# Patient Record
Sex: Male | Born: 1950 | Race: White | Hispanic: No | Marital: Married | Smoking: Former smoker
Health system: Southern US, Community
[De-identification: ages and names within clinical notes are randomized; demographics above are authoritative.]

## PROBLEM LIST (undated history)

## (undated) DIAGNOSIS — G4730 Sleep apnea, unspecified: Secondary | ICD-10-CM

## (undated) DIAGNOSIS — J189 Pneumonia, unspecified organism: Secondary | ICD-10-CM

## (undated) DIAGNOSIS — T7840XA Allergy, unspecified, initial encounter: Secondary | ICD-10-CM

## (undated) DIAGNOSIS — R011 Cardiac murmur, unspecified: Secondary | ICD-10-CM

## (undated) DIAGNOSIS — Z7901 Long term (current) use of anticoagulants: Secondary | ICD-10-CM

## (undated) DIAGNOSIS — I10 Essential (primary) hypertension: Secondary | ICD-10-CM

## (undated) DIAGNOSIS — J181 Lobar pneumonia, unspecified organism: Secondary | ICD-10-CM

## (undated) DIAGNOSIS — J449 Chronic obstructive pulmonary disease, unspecified: Secondary | ICD-10-CM

## (undated) DIAGNOSIS — M51369 Other intervertebral disc degeneration, lumbar region without mention of lumbar back pain or lower extremity pain: Secondary | ICD-10-CM

## (undated) DIAGNOSIS — R4182 Altered mental status, unspecified: Secondary | ICD-10-CM

## (undated) DIAGNOSIS — E119 Type 2 diabetes mellitus without complications: Secondary | ICD-10-CM

## (undated) DIAGNOSIS — J9601 Acute respiratory failure with hypoxia: Secondary | ICD-10-CM

## (undated) DIAGNOSIS — I340 Nonrheumatic mitral (valve) insufficiency: Secondary | ICD-10-CM

## (undated) DIAGNOSIS — I4819 Other persistent atrial fibrillation: Secondary | ICD-10-CM

## (undated) DIAGNOSIS — A419 Sepsis, unspecified organism: Secondary | ICD-10-CM

## (undated) DIAGNOSIS — Z86711 Personal history of pulmonary embolism: Secondary | ICD-10-CM

## (undated) DIAGNOSIS — E669 Obesity, unspecified: Secondary | ICD-10-CM

## (undated) DIAGNOSIS — M5136 Other intervertebral disc degeneration, lumbar region: Secondary | ICD-10-CM

## (undated) DIAGNOSIS — N179 Acute kidney failure, unspecified: Secondary | ICD-10-CM

## (undated) DIAGNOSIS — I480 Paroxysmal atrial fibrillation: Secondary | ICD-10-CM

## (undated) DIAGNOSIS — K635 Polyp of colon: Secondary | ICD-10-CM

## (undated) DIAGNOSIS — I4891 Unspecified atrial fibrillation: Secondary | ICD-10-CM

## (undated) HISTORY — DX: Acute kidney failure, unspecified: N17.9

## (undated) HISTORY — DX: Nonrheumatic mitral (valve) insufficiency: I34.0

## (undated) HISTORY — DX: Personal history of pulmonary embolism: Z86.711

## (undated) HISTORY — DX: Other intervertebral disc degeneration, lumbar region: M51.36

## (undated) HISTORY — DX: Cardiac murmur, unspecified: R01.1

## (undated) HISTORY — DX: Sepsis, unspecified organism: A41.9

## (undated) HISTORY — DX: Lobar pneumonia, unspecified organism: J18.1

## (undated) HISTORY — DX: Polyp of colon: K63.5

## (undated) HISTORY — DX: Unspecified atrial fibrillation: I48.91

## (undated) HISTORY — DX: Paroxysmal atrial fibrillation: I48.0

## (undated) HISTORY — DX: Altered mental status, unspecified: R41.82

## (undated) HISTORY — DX: Acute respiratory failure with hypoxia: J96.01

## (undated) HISTORY — DX: Type 2 diabetes mellitus without complications: E11.9

## (undated) HISTORY — DX: Sleep apnea, unspecified: G47.30

## (undated) HISTORY — DX: Chronic obstructive pulmonary disease, unspecified: J44.9

## (undated) HISTORY — DX: Essential (primary) hypertension: I10

## (undated) HISTORY — DX: Other intervertebral disc degeneration, lumbar region without mention of lumbar back pain or lower extremity pain: M51.369

## (undated) HISTORY — DX: Obesity, unspecified: E66.9

## (undated) HISTORY — DX: Allergy, unspecified, initial encounter: T78.40XA

## (undated) HISTORY — DX: Pneumonia, unspecified organism: J18.9

## (undated) HISTORY — DX: Long term (current) use of anticoagulants: Z79.01

## (undated) HISTORY — DX: Other persistent atrial fibrillation: I48.19

---

## 2000-02-05 ENCOUNTER — Ambulatory Visit (HOSPITAL_COMMUNITY): Admission: RE | Admit: 2000-02-05 | Discharge: 2000-02-05 | Payer: Self-pay | Admitting: Family Medicine

## 2000-02-05 ENCOUNTER — Encounter: Payer: Self-pay | Admitting: Family Medicine

## 2000-02-13 ENCOUNTER — Encounter: Admission: RE | Admit: 2000-02-13 | Discharge: 2000-03-31 | Payer: Self-pay | Admitting: Family Medicine

## 2003-10-08 ENCOUNTER — Ambulatory Visit (HOSPITAL_COMMUNITY): Admission: RE | Admit: 2003-10-08 | Discharge: 2003-10-08 | Payer: Self-pay | Admitting: Gastroenterology

## 2003-10-08 LAB — HM COLONOSCOPY

## 2005-05-02 ENCOUNTER — Emergency Department (HOSPITAL_COMMUNITY): Admission: EM | Admit: 2005-05-02 | Discharge: 2005-05-02 | Payer: Self-pay | Admitting: Family Medicine

## 2012-12-02 ENCOUNTER — Encounter: Payer: Self-pay | Admitting: Family Medicine

## 2012-12-02 DIAGNOSIS — M5136 Other intervertebral disc degeneration, lumbar region: Secondary | ICD-10-CM | POA: Insufficient documentation

## 2012-12-02 DIAGNOSIS — I10 Essential (primary) hypertension: Secondary | ICD-10-CM | POA: Insufficient documentation

## 2012-12-02 DIAGNOSIS — K635 Polyp of colon: Secondary | ICD-10-CM | POA: Insufficient documentation

## 2012-12-02 DIAGNOSIS — E119 Type 2 diabetes mellitus without complications: Secondary | ICD-10-CM | POA: Insufficient documentation

## 2012-12-02 DIAGNOSIS — R011 Cardiac murmur, unspecified: Secondary | ICD-10-CM | POA: Insufficient documentation

## 2012-12-02 DIAGNOSIS — T7840XA Allergy, unspecified, initial encounter: Secondary | ICD-10-CM | POA: Insufficient documentation

## 2012-12-09 ENCOUNTER — Encounter: Payer: Self-pay | Admitting: Family Medicine

## 2012-12-09 ENCOUNTER — Ambulatory Visit (INDEPENDENT_AMBULATORY_CARE_PROVIDER_SITE_OTHER): Payer: BC Managed Care – PPO | Admitting: Family Medicine

## 2012-12-09 VITALS — BP 120/74 | HR 88 | Temp 98.2°F | Resp 20 | Wt 320.0 lb

## 2012-12-09 DIAGNOSIS — IMO0001 Reserved for inherently not codable concepts without codable children: Secondary | ICD-10-CM

## 2012-12-09 DIAGNOSIS — E669 Obesity, unspecified: Secondary | ICD-10-CM

## 2012-12-09 DIAGNOSIS — R0601 Orthopnea: Secondary | ICD-10-CM

## 2012-12-09 DIAGNOSIS — R0989 Other specified symptoms and signs involving the circulatory and respiratory systems: Secondary | ICD-10-CM

## 2012-12-09 DIAGNOSIS — R0600 Dyspnea, unspecified: Secondary | ICD-10-CM

## 2012-12-09 DIAGNOSIS — R5383 Other fatigue: Secondary | ICD-10-CM

## 2012-12-09 DIAGNOSIS — R5381 Other malaise: Secondary | ICD-10-CM

## 2012-12-09 HISTORY — DX: Obesity, unspecified: E66.9

## 2012-12-09 LAB — BASIC METABOLIC PANEL
BUN: 18 mg/dL (ref 6–23)
CO2: 31 mEq/L (ref 19–32)
Calcium: 8.8 mg/dL (ref 8.4–10.5)
Chloride: 101 mEq/L (ref 96–112)
Creat: 0.91 mg/dL (ref 0.50–1.35)
Glucose, Bld: 102 mg/dL — ABNORMAL HIGH (ref 70–99)
Potassium: 4.6 mEq/L (ref 3.5–5.3)
Sodium: 140 mEq/L (ref 135–145)

## 2012-12-09 LAB — HEPATIC FUNCTION PANEL
ALT: 12 U/L (ref 0–53)
Bilirubin, Direct: 0.3 mg/dL (ref 0.0–0.3)
Total Bilirubin: 0.8 mg/dL (ref 0.3–1.2)

## 2012-12-09 NOTE — Progress Notes (Signed)
Subjective:     Patient ID: William Alvarado, male   DOB: Apr 07, 1951, 62 y.o.   MRN: [id number]  HPI Patient is a 62 year old white male here for followup for his diabetes, hypertension, hyperlipidemia. Current Outpatient Prescriptions on File Prior to Visit  Medication Sig Dispense Refill  . aspirin 81 MG tablet Take 81 mg by mouth daily.      . fish oil-omega-3 fatty acids 1000 MG capsule Take 1 g by mouth daily.      . furosemide (LASIX) 40 MG tablet Take 40 mg by mouth 2 (two) times daily.      Marland Kitchen lisinopril (PRINIVIL,ZESTRIL) 20 MG tablet Take 20 mg by mouth daily.      . metFORMIN (GLUCOPHAGE) 1000 MG tablet Take 1,000 mg by mouth 2 (two) times daily with a meal.       No current facility-administered medications on file prior to visit.   is not checking his sugars at present.  Medications are reviewed with the patient he reports compliance.  However he is concerned because he's having increasing dyspnea on exertion, orthopnea, and paroxysmal nocturnal dyspnea.  He is now using Lasix twice a day.  He is wearing his compression stockings.  However, his legs continue to swell.  He is also having to sleep in his recliner due to feeling like he smothering and shortness of breath.  His leg swelling is getting worse.  He also snores loudly at night and his wife has witnessed apnea episodes.  He denies any chest pain.  Denies any hypoglycemia.     Review of Systems Review of systems is otherwise normal    Objective:   Physical Exam  Constitutional: He appears well-developed and well-nourished.  HENT:  Head: Normocephalic and atraumatic.  Eyes: Conjunctivae and EOM are normal. Pupils are equal, round, and reactive to light.  Neck: Normal range of motion. Neck supple. No thyromegaly present.  Cardiovascular: Normal rate, regular rhythm and normal heart sounds.   Pulmonary/Chest: Effort normal. He has decreased breath sounds. He has wheezes. He has rales in the right lower field and the left  lower field.   he has a small mandible and a large neck circumference He has +2 edema to his knees bilaterally     Assessment:     Type II or unspecified type diabetes mellitus without mention of complication, uncontrolled - Plan: Basic Metabolic Panel, Hemoglobin A1c, Hepatic Function Panel  Other malaise and fatigue - Plan: Nocturnal polysomnography (NPSG), 2D Echocardiogram with contrast  Orthopnea - Plan: Nocturnal polysomnography (NPSG), 2D Echocardiogram with contrast  Paroxysmal nocturnal dyspnea - Plan: Nocturnal polysomnography (NPSG), 2D Echocardiogram with contrast  Morbid obesity - Plan: Nocturnal polysomnography (NPSG), 2D Echocardiogram with contrast      Plan:     I will review his laboratory studies and titrate his medicines to affect his goal LDL is less than 100.  His goal A1c is less than 6.5.  I recommended diet exercise and weight loss.  I recommended smoking cessation.  Further treatment will de determined pending on his sleep study and echo results.   Marland Kitchen

## 2012-12-10 LAB — HEMOGLOBIN A1C
Hgb A1c MFr Bld: 6.2 % — ABNORMAL HIGH (ref <5.7)
Mean Plasma Glucose: 131 mg/dL — ABNORMAL HIGH (ref <117)

## 2012-12-13 ENCOUNTER — Encounter: Payer: Self-pay | Admitting: Family Medicine

## 2012-12-13 ENCOUNTER — Telehealth: Payer: Self-pay | Admitting: Family Medicine

## 2012-12-13 DIAGNOSIS — R609 Edema, unspecified: Secondary | ICD-10-CM

## 2012-12-13 MED ORDER — FUROSEMIDE 40 MG PO TABS: 40.0000 mg | ORAL_TABLET | Freq: Two times a day (BID) | ORAL | Status: DC

## 2012-12-13 NOTE — Telephone Encounter (Signed)
Medication refilled per protocol.Patient needs to be seen before any further refills 

## 2012-12-15 ENCOUNTER — Encounter: Payer: Self-pay | Admitting: Family Medicine

## 2012-12-20 ENCOUNTER — Other Ambulatory Visit (HOSPITAL_COMMUNITY): Payer: Self-pay | Admitting: Family Medicine

## 2012-12-20 DIAGNOSIS — R0602 Shortness of breath: Secondary | ICD-10-CM

## 2012-12-22 ENCOUNTER — Other Ambulatory Visit (HOSPITAL_COMMUNITY): Payer: Self-pay

## 2012-12-23 ENCOUNTER — Telehealth: Payer: Self-pay | Admitting: Family Medicine

## 2012-12-23 ENCOUNTER — Ambulatory Visit (INDEPENDENT_AMBULATORY_CARE_PROVIDER_SITE_OTHER): Payer: BC Managed Care – PPO | Admitting: Family Medicine

## 2012-12-23 ENCOUNTER — Ambulatory Visit (HOSPITAL_COMMUNITY): Payer: BC Managed Care – PPO | Attending: Family Medicine

## 2012-12-23 VITALS — BP 120/78 | HR 120 | Temp 98.4°F | Resp 24 | Wt 314.0 lb

## 2012-12-23 DIAGNOSIS — R5381 Other malaise: Secondary | ICD-10-CM | POA: Insufficient documentation

## 2012-12-23 DIAGNOSIS — R0989 Other specified symptoms and signs involving the circulatory and respiratory systems: Secondary | ICD-10-CM | POA: Insufficient documentation

## 2012-12-23 DIAGNOSIS — R0602 Shortness of breath: Secondary | ICD-10-CM

## 2012-12-23 DIAGNOSIS — I4891 Unspecified atrial fibrillation: Secondary | ICD-10-CM | POA: Insufficient documentation

## 2012-12-23 DIAGNOSIS — R609 Edema, unspecified: Secondary | ICD-10-CM

## 2012-12-23 DIAGNOSIS — R5383 Other fatigue: Secondary | ICD-10-CM | POA: Insufficient documentation

## 2012-12-23 DIAGNOSIS — R0609 Other forms of dyspnea: Secondary | ICD-10-CM | POA: Insufficient documentation

## 2012-12-23 MED ORDER — METOPROLOL TARTRATE 25 MG PO TABS: 25.0000 mg | ORAL_TABLET | Freq: Two times a day (BID) | ORAL | Status: DC

## 2012-12-23 NOTE — Progress Notes (Addendum)
Echocardiogram performed. Patient found to be in rapid A fib today at echo appt.  Spoke with DOD, Dr Eden Emms, he said patient should be set up for appt here on Monday or Tuesday and it was OK for patient to go home.  I called Dr Tanya Nones his medical doctor and left a message on answering machine that patient was found to be in A fib.

## 2012-12-24 ENCOUNTER — Encounter: Payer: Self-pay | Admitting: Family Medicine

## 2012-12-24 LAB — TSH: TSH: 1.722 u[IU]/mL (ref 0.350–4.500)

## 2012-12-24 NOTE — Progress Notes (Signed)
Subjective:     Patient ID: William Alvarado, male   DOB: 1951-01-11, 62 y.o.   MRN: [id number]  HPI  At last office visit, patient was concerned because he was having increasing dyspnea on exertion, orthopnea, and paroxysmal nocturnal dyspnea.  Even with using Lasix twice a day and wearing his compression stockings, his legs continued to swell.  He was also having to sleep in his recliner due to feeling like he smothering and shortness of breath.    At the time, he was in NSR.  I subsequently ordered an echocardiogram and a sleep study to evaluate for congestive heart failure, pulmonary hypertension, or obstructive sleep apnea. The patient has not scheduled a sleep study due to a high insurance deductible.  He did go get the echocardiogram today and was found to be in atrial fibrillation with rapid ventricular response.  I called the patient and asked him to come to the office immediately.  He denies chest pain or syncope. He denies presyncope. He does report profound fatigue.  He continues to smoke.  Past Medical History  Diagnosis Date  . Hypertension   . Allergy     allergic rhinitis  . Systolic murmur   . Colon polyps   . Diabetes mellitus without complication   . Degenerative disc disease, lumbar     Current Outpatient Prescriptions on File Prior to Visit  Medication Sig Dispense Refill  . aspirin 81 MG tablet Take 81 mg by mouth daily.      . fish oil-omega-3 fatty acids 1000 MG capsule Take 1 g by mouth daily.      . furosemide (LASIX) 40 MG tablet Take 1 tablet (40 mg total) by mouth 2 (two) times daily.  30 tablet  0  . lisinopril (PRINIVIL,ZESTRIL) 20 MG tablet Take 20 mg by mouth daily.      . metFORMIN (GLUCOPHAGE) 1000 MG tablet Take 1,000 mg by mouth 2 (two) times daily with a meal.       No current facility-administered medications on file prior to visit.   History   Social History  . Marital Status: Married    Spouse Name: N/A    Number of Children: N/A  . Years  of Education: N/A   Occupational History  . Not on file.   Social History Main Topics  . Smoking status: Current Every Day Smoker -- 1.50 packs/day    Types: Cigarettes  . Smokeless tobacco: Never Used  . Alcohol Use: No  . Drug Use: No  . Sexually Active: Not on file   Other Topics Concern  . Not on file   Social History Narrative  . No narrative on file     Family History  Problem Relation Age of Onset  . Cancer Mother 76    breast  . Heart disease Father 34    MI  . Cancer Brother     lung  . Stroke Brother 45     Review of Systems  Review of systems is otherwise normal    Objective:   Physical Exam  Constitutional: He appears well-developed and well-nourished.  HENT:  Head: Normocephalic and atraumatic.  Eyes: Conjunctivae and EOM are normal. Pupils are equal, round, and reactive to light.  Neck: Normal range of motion. Neck supple. No thyromegaly present.  Cardiovascular: Normal heart sounds and normal pulses.  An irregularly irregular rhythm present. Tachycardia present.   Pulmonary/Chest: Effort normal. He has decreased breath sounds. He has wheezes. He has rales in the right  lower field and the left lower field.   he has a small mandible and a large neck circumference He has +2 edema to his knees bilaterally    EKG shows atrial fibrillation with a heart rate of 120 beats per minute there are nonspecific ST changes in the inferior leads. There is poor R wave progression in the precordial leads. Assessment:     Atrial fibrillation - Plan: TSH Rapid ventricular response Probable sleep apnea Edema      Plan:     Patient has an office visit with Dr. Jens Som on Monday.  For the present time we will attempt to achieve rate control. Started patient on Lopressor 25 mg by mouth twice a day.  Instructed him to call me immediately if his heart rate worsens or he develops any symptoms such as presyncope, syncope, or chest pain.  Given his long smoking history,  we will have to monitor for bronchospasm on metoprolol.  Also began Pradaxa 150 mg pobid for anticoagulation.  Spent over 20 minutes discussing diagnosis with patient, and natural history, and implications.  Answered all questions.  I believe his pulmonary issues play a large role in the reason he has developed atrial fibrillation.  Strongly encouraged sleep study. Strongly encouraged smoking cessation.   Marland Kitchen

## 2012-12-26 ENCOUNTER — Other Ambulatory Visit: Payer: Self-pay | Admitting: Cardiology

## 2012-12-26 ENCOUNTER — Other Ambulatory Visit: Payer: Self-pay | Admitting: *Deleted

## 2012-12-26 ENCOUNTER — Encounter: Payer: Self-pay | Admitting: *Deleted

## 2012-12-26 ENCOUNTER — Ambulatory Visit (INDEPENDENT_AMBULATORY_CARE_PROVIDER_SITE_OTHER): Payer: BC Managed Care – PPO | Admitting: Cardiology

## 2012-12-26 ENCOUNTER — Encounter: Payer: Self-pay | Admitting: Cardiology

## 2012-12-26 VITALS — BP 114/80 | HR 106 | Wt 315.0 lb

## 2012-12-26 DIAGNOSIS — I10 Essential (primary) hypertension: Secondary | ICD-10-CM

## 2012-12-26 DIAGNOSIS — I4891 Unspecified atrial fibrillation: Secondary | ICD-10-CM

## 2012-12-26 DIAGNOSIS — I340 Nonrheumatic mitral (valve) insufficiency: Secondary | ICD-10-CM

## 2012-12-26 DIAGNOSIS — I059 Rheumatic mitral valve disease, unspecified: Secondary | ICD-10-CM

## 2012-12-26 DIAGNOSIS — R609 Edema, unspecified: Secondary | ICD-10-CM

## 2012-12-26 HISTORY — DX: Nonrheumatic mitral (valve) insufficiency: I34.0

## 2012-12-26 LAB — HEPATIC FUNCTION PANEL
Alkaline Phosphatase: 91 U/L (ref 39–117)
Bilirubin, Direct: 0.3 mg/dL (ref 0.0–0.3)
Total Bilirubin: 0.9 mg/dL (ref 0.3–1.2)

## 2012-12-26 LAB — CBC WITH DIFFERENTIAL/PLATELET
Eosinophils Absolute: 0.1 10*3/uL (ref 0.0–0.7)
Lymphocytes Relative: 16.3 % (ref 12.0–46.0)
MCHC: 32.4 g/dL (ref 30.0–36.0)
MCV: 85.2 fl (ref 78.0–100.0)
Monocytes Absolute: 1 10*3/uL (ref 0.1–1.0)
Neutrophils Relative %: 70.7 % (ref 43.0–77.0)
Platelets: 306 10*3/uL (ref 150.0–400.0)
WBC: 9 10*3/uL (ref 4.5–10.5)

## 2012-12-26 LAB — BASIC METABOLIC PANEL
CO2: 33 mEq/L — ABNORMAL HIGH (ref 19–32)
Calcium: 8.8 mg/dL (ref 8.4–10.5)
Potassium: 3.8 mEq/L (ref 3.5–5.1)
Sodium: 138 mEq/L (ref 135–145)

## 2012-12-26 MED ORDER — SODIUM CHLORIDE 0.9 % IJ SOLN: 3.0000 mL | Freq: Two times a day (BID) | INTRAMUSCULAR | Status: DC

## 2012-12-26 MED ORDER — SODIUM CHLORIDE 0.9 % IJ SOLN: 3.0000 mL | INTRAMUSCULAR | Status: DC | PRN

## 2012-12-26 MED ORDER — SODIUM CHLORIDE 0.9 % IV SOLN: 250.0000 mL | INTRAVENOUS | Status: DC

## 2012-12-26 MED ORDER — METOPROLOL TARTRATE 50 MG PO TABS: 50.0000 mg | ORAL_TABLET | Freq: Two times a day (BID) | ORAL | Status: DC

## 2012-12-26 MED ORDER — LISINOPRIL 5 MG PO TABS: 5.0000 mg | ORAL_TABLET | Freq: Every day | ORAL | Status: DC

## 2012-12-26 MED ORDER — POTASSIUM CHLORIDE CRYS ER 20 MEQ PO TBCR: 20.0000 meq | EXTENDED_RELEASE_TABLET | Freq: Every day | ORAL | Status: DC

## 2012-12-26 MED ORDER — FUROSEMIDE 40 MG PO TABS: ORAL_TABLET | ORAL | Status: DC

## 2012-12-26 NOTE — Telephone Encounter (Signed)
Pt came here for ov 12/23/12

## 2012-12-26 NOTE — Progress Notes (Signed)
Echo Report Faxed to Dr. Tanya Nones office

## 2012-12-26 NOTE — Assessment & Plan Note (Signed)
Patient will need evaluation for sleep apnea and this has been arranged by primary care. Counseled on weight loss.

## 2012-12-26 NOTE — Progress Notes (Signed)
HPI: 62 year old male for evaluation of atrial fibrillation. Echocardiogram in April of 2014 showed normal LV function, moderate mitral regurgitation, trace aortic insufficiency, moderate tricuspid regurgitation and moderate biatrial enlargement. TSH in April of 2014 was 1.722. Renal function and LFTs in March of 2014 normal. The patient has chronic pedal edema. However over the past several months it has worsened and he has had some abdominal swelling. He also notes dyspnea on exertion for approximately one month. There is also orthopnea but he denies PND. No chest pain, palpitations, bleeding or syncope. Patient recently noted to be in atrial fibrillation and cardiology asked to evaluate. Note his Lasix was increased to twice daily and there is some improvement in his lower extremity edema.  Current Outpatient Prescriptions  Medication Sig Dispense Refill  . dabigatran (PRADAXA) 150 MG CAPS Take 150 mg by mouth every 12 (twelve) hours.      . fish oil-omega-3 fatty acids 1000 MG capsule Take 1 g by mouth daily.      . furosemide (LASIX) 40 MG tablet Take 1 tablet (40 mg total) by mouth 2 (two) times daily.  30 tablet  0  . lisinopril (PRINIVIL,ZESTRIL) 20 MG tablet Take 20 mg by mouth daily.      . metFORMIN (GLUCOPHAGE) 1000 MG tablet Take 1,000 mg by mouth 2 (two) times daily with a meal.      . metoprolol tartrate (LOPRESSOR) 25 MG tablet Take 1 tablet (25 mg total) by mouth 2 (two) times daily.  60 tablet  3   No current facility-administered medications for this visit.    No Known Allergies  Past Medical History  Diagnosis Date  . Hypertension   . Allergy     allergic rhinitis  . Systolic murmur   . Colon polyps   . Diabetes mellitus without complication   . Degenerative disc disease, lumbar     History reviewed. No pertinent past surgical history.  History   Social History  . Marital Status: Married    Spouse Name: N/A    Number of Children: 2  . Years of Education: N/A     Occupational History  .      Machinist   Social History Main Topics  . Smoking status: Current Every Day Smoker -- 1.50 packs/day    Types: Cigarettes  . Smokeless tobacco: Never Used  . Alcohol Use: No  . Drug Use: No  . Sexually Active: Not on file   Other Topics Concern  . Not on file   Social History Narrative  . No narrative on file    Family History  Problem Relation Age of Onset  . Cancer Mother 58    breast  . Heart disease Father 61    MI  . Cancer Brother     lung  . Stroke Brother 45    ROS: no fevers or chills, productive cough, hemoptysis, dysphasia, odynophagia, melena, hematochezia, dysuria, hematuria, rash, seizure activity, orthopnea, PND, claudication. Remaining systems are negative.  Physical Exam:   Blood pressure 114/80, pulse 106, weight 315 lb (142.883 kg).  General:  Well developed/obese in NAD Skin warm/dry Patient not depressed No peripheral clubbing Back-normal HEENT-normal/normal eyelids Neck supple/normal carotid upstroke bilaterally; no bruits; no JVD; no thyromegaly chest - CTA/ normal expansion CV - tachycardic and irregular/normal S1 and S2; no murmurs, rubs or gallops;  PMI nondisplaced Abdomen -NT/ND, no HSM, no mass, + bowel sounds, no bruit, abdominal wall edema 2+ femoral pulses, no bruits Ext-2+ edema, no chords, Difficult  to palpate due to edema Neuro-grossly nonfocal  ECG atrial fibrillation with occasional PVC or aberrantly conducted beats. Right axis deviation. RV conduction delay. Cannot rule out prior anterior infarct. Low voltage.

## 2012-12-26 NOTE — Assessment & Plan Note (Signed)
Patient with newly diagnosed atrial fibrillation. LV function is normal and TSH is normal. He is symptomatic and is significantly volume overloaded. This is improving with higher dose Lasix. Check potassium and renal function today. If renal function is stable I will most likely increase his Lasix further. Check CBC and liver functions. Lengthy discussion today concerning options. Given that he is symptomatic I would like to reestablish sinus rhythm. His rate is elevated. Increase metoprolol to 25 mg by mouth twice a day. Continue pradaxa. I offered TEE guided cardioversion but he would like to afford the TEE. Instead he prefers to continue anticoagulation for 3 consecutive weeks and then proceed with cardioversion. We will arrange this. If he does not hold sinus he will need an antiarrhythmic most likely tikosyn. I will have him return in one and one half weeks to see one of physician's assistants to make sure that he is not deteriorating further. If so he may need to be admitted for IV diuresis.

## 2012-12-26 NOTE — Assessment & Plan Note (Signed)
Given that we are increasing his metoprolol I will decrease his lisinopril to make blood pressure room for increasing AV nodal blocking agent doses

## 2012-12-26 NOTE — Assessment & Plan Note (Signed)
Patient noted to have moderate MR  And TR on recent echo. He will need followup echoes in the future. Question contribution to atrial fibrillation.

## 2012-12-26 NOTE — Patient Instructions (Addendum)
Your physician recommends that you schedule a follow-up appointment in: 01-06-13 WITH THE PA  Your physician recommends that you schedule a follow-up appointment in: 6-8 WEEKS WITH DR Jens Som  Your physician recommends that you HAVE LAB WORK TODAY  INCREASE METOPROLOL TO 50 MG TWICE DAILY  DECREASE LISINOPRIL TO 5 MG ONCE DAILY   Your physician has recommended that you have a Cardioversion (DCCV). Electrical Cardioversion uses a jolt of electricity to your heart either through paddles or wired patches attached to your chest. This is a controlled, usually prescheduled, procedure. Defibrillation is done under light anesthesia in the hospital, and you usually go home the day of the procedure. This is done to get your heart back into a normal rhythm. You are not awake for the procedure. Please see the instruction sheet given to you today.

## 2012-12-27 ENCOUNTER — Telehealth: Payer: Self-pay | Admitting: Cardiology

## 2012-12-27 ENCOUNTER — Ambulatory Visit (HOSPITAL_BASED_OUTPATIENT_CLINIC_OR_DEPARTMENT_OTHER): Payer: BC Managed Care – PPO

## 2012-12-27 NOTE — Telephone Encounter (Signed)
Spoke with pt wife, the pt is considering TEE cardioversion and wonders how soon that can be done. Discussed with dr Jens Som, the TEE cardioversion can be scheduled as soon as next week. Explained to pts wife that Joice Lofts is not a first line med for the atrial fib. He would need to be cardioverted first and then go back into atrial fib before tikosyn would be considered. Pt wife voiced understanding and they will call me back if they want to do the TEE cardioversion next week.

## 2012-12-27 NOTE — Telephone Encounter (Signed)
New problem   Pt have a question about the procedure he is having. Please call wife in reference to the procedure.

## 2013-01-03 ENCOUNTER — Encounter: Payer: Self-pay | Admitting: Family Medicine

## 2013-01-06 ENCOUNTER — Other Ambulatory Visit: Payer: Self-pay | Admitting: Nurse Practitioner

## 2013-01-06 ENCOUNTER — Other Ambulatory Visit (INDEPENDENT_AMBULATORY_CARE_PROVIDER_SITE_OTHER): Payer: BC Managed Care – PPO

## 2013-01-06 ENCOUNTER — Encounter (HOSPITAL_COMMUNITY): Payer: Self-pay | Admitting: Pharmacy Technician

## 2013-01-06 ENCOUNTER — Encounter: Payer: Self-pay | Admitting: Nurse Practitioner

## 2013-01-06 ENCOUNTER — Ambulatory Visit (INDEPENDENT_AMBULATORY_CARE_PROVIDER_SITE_OTHER): Payer: BC Managed Care – PPO | Admitting: Nurse Practitioner

## 2013-01-06 VITALS — BP 116/84 | HR 109 | Ht 73.0 in | Wt 325.4 lb

## 2013-01-06 DIAGNOSIS — I4891 Unspecified atrial fibrillation: Secondary | ICD-10-CM

## 2013-01-06 DIAGNOSIS — R0989 Other specified symptoms and signs involving the circulatory and respiratory systems: Secondary | ICD-10-CM

## 2013-01-06 DIAGNOSIS — R609 Edema, unspecified: Secondary | ICD-10-CM

## 2013-01-06 LAB — CBC WITH DIFFERENTIAL/PLATELET
Basophils Absolute: 0.1 10*3/uL (ref 0.0–0.1)
Basophils Relative: 1.1 % (ref 0.0–3.0)
Eosinophils Absolute: 0.2 10*3/uL (ref 0.0–0.7)
Eosinophils Relative: 1.8 % (ref 0.0–5.0)
HCT: 44.5 % (ref 39.0–52.0)
Hemoglobin: 14.3 g/dL (ref 13.0–17.0)
Lymphocytes Relative: 13 % (ref 12.0–46.0)
Lymphs Abs: 1.3 10*3/uL (ref 0.7–4.0)
MCHC: 32.1 g/dL (ref 30.0–36.0)
MCV: 85.4 fl (ref 78.0–100.0)
Monocytes Absolute: 1 10*3/uL (ref 0.1–1.0)
Monocytes Relative: 10.6 % (ref 3.0–12.0)
Neutro Abs: 7.2 10*3/uL (ref 1.4–7.7)
Neutrophils Relative %: 73.5 % (ref 43.0–77.0)
Platelets: 308 10*3/uL (ref 150.0–400.0)
RBC: 5.22 Mil/uL (ref 4.22–5.81)
RDW: 18.5 % — ABNORMAL HIGH (ref 11.5–14.6)
WBC: 9.8 10*3/uL (ref 4.5–10.5)

## 2013-01-06 LAB — BASIC METABOLIC PANEL
BUN: 18 mg/dL (ref 6–23)
CO2: 33 mEq/L — ABNORMAL HIGH (ref 19–32)
Calcium: 8.3 mg/dL — ABNORMAL LOW (ref 8.4–10.5)
Chloride: 99 mEq/L (ref 96–112)
Creatinine, Ser: 0.9 mg/dL (ref 0.4–1.5)
GFR: 91.6 mL/min (ref >60.00)
Glucose, Bld: 102 mg/dL — ABNORMAL HIGH (ref 70–99)
Potassium: 3.6 mEq/L (ref 3.5–5.1)
Sodium: 137 mEq/L (ref 135–145)

## 2013-01-06 LAB — PROTIME-INR
INR: 1.7 ratio — ABNORMAL HIGH (ref 0.8–1.0)
Prothrombin Time: 17.5 s — ABNORMAL HIGH (ref 10.2–12.4)

## 2013-01-06 LAB — APTT: aPTT: 57.9 s — ABNORMAL HIGH (ref 21.7–28.8)

## 2013-01-06 MED ORDER — FUROSEMIDE 40 MG PO TABS: 80.0000 mg | ORAL_TABLET | Freq: Two times a day (BID) | ORAL | Status: DC

## 2013-01-06 MED ORDER — DILTIAZEM HCL ER COATED BEADS 240 MG PO CP24: 240.0000 mg | ORAL_CAPSULE | Freq: Every day | ORAL | Status: DC

## 2013-01-06 NOTE — Patient Instructions (Addendum)
I am adding Diltiazem 240 mg a day to help control your heart rate  I am increasing your Lasix to 40 mg to take 2 pills two times a day  Stop your Lisinopril for now and do not take  We will check labs today  Take your potassium pill  We will try to get a TEE guided cardioversion arranged for next week in the place of the cardioversion  Call the [facility] office at [phone] if you have any questions, problems or concerns.

## 2013-01-06 NOTE — Progress Notes (Signed)
 William Alvarado Date of Birth: 09/14/1951 Medical Record #[id number]  History of Present Illness: William Alvarado is seen back today for a follow up visit. It is an approximate 10 day check. Seen for Dr. Crenshaw. Has had new onset atrial fib. On Pradaxa. Has normal LV function by echo in April of 2014 with moderate MR, trace AI, moderate TR and moderate biatrial enlargement. Normal TSH. Has chronic pedal edema.   Seen earlier this month. Some volume overload and his rate was not controlled.Lasix was increased along with his dose of Metoprolol. Patient did not want TEE guided cardioversion but was opting for plain cardioversion after sufficient anticoagulation. Plan was for Tikosyn if he fails to maintain sinus rhythm following cardioversion.   He comes in today. He is here with his wife. He remains in atrial fib. He is really not aware but has volume overload. More swelling reported. Weight is up 10 pounds. Has DOE. Using salt (cans of soup). Remains morbidly obese. Now interested in proceeding on with TEE guided cardioversion instead of the plain cardioversion which was planned for the 28th. Has been on his Pradaxa 2 weeks as of today. No missed doses. No bleeding. Not taking his potassium - wife is trying to supplement "naturally".   Current Outpatient Prescriptions on File Prior to Visit  Medication Sig Dispense Refill  . dabigatran (PRADAXA) 150 MG CAPS Take 150 mg by mouth every 12 (twelve) hours.      . fish oil-omega-3 fatty acids 1000 MG capsule Take 1 g by mouth daily.      . furosemide (LASIX) 40 MG tablet One and one half tablets twice daily  90 tablet  12  . lisinopril (PRINIVIL,ZESTRIL) 5 MG tablet Take 1 tablet (5 mg total) by mouth daily.  30 tablet  12  . metFORMIN (GLUCOPHAGE) 1000 MG tablet Take 1,000 mg by mouth 2 (two) times daily with a meal.      . metoprolol tartrate (LOPRESSOR) 50 MG tablet Take 1 tablet (50 mg total) by mouth 2 (two) times daily.  60 tablet  12  . potassium  chloride SA (K-DUR,KLOR-CON) 20 MEQ tablet Take 1 tablet (20 mEq total) by mouth daily.  90 tablet  3   No current facility-administered medications on file prior to visit.    No Known Allergies  Past Medical History  Diagnosis Date  . Hypertension   . Allergy     allergic rhinitis  . Systolic murmur   . Colon polyps   . Diabetes mellitus without complication   . Degenerative disc disease, lumbar   . Atrial fibrillation     History reviewed. No pertinent past surgical history.  History  Smoking status  . Current Every Day Smoker -- 1.50 packs/day  . Types: Cigarettes  Smokeless tobacco  . Never Used    History  Alcohol Use No    Family History  Problem Relation Age of Onset  . Cancer Mother 50    breast  . Heart disease Father 50    MI  . Cancer Brother     lung  . Stroke Brother 45    Review of Systems: The review of systems is per the HPI.  All other systems were reviewed and are negative.  Physical Exam: BP 116/84  Pulse 109  Ht 6' 1" (1.854 m)  Wt 325 lb 6 oz (147.589 kg)  BMI 42.94 kg/m2  SpO2 94% Patient is very pleasant and in no acute distress. Skin is warm and dry. Color   is normal.  HEENT is unremarkable. Normocephalic/atraumatic. PERRL. Sclera are nonicteric. Neck is supple. No masses. No JVD. Lungs are clear. Cardiac exam shows an irregular rate and rhythm. Rate is a little fast today. Abdomen is soft. Extremities are with pedal edema. Gait and ROM are intact. No gross neurologic deficits noted.  LABORATORY DATA: EKG today shows atrial fib with uncontrolled VR. Rate is 109  Lab Results  Component Value Date   WBC 9.0 12/26/2012   HGB 14.3 12/26/2012   HCT 44.1 12/26/2012   PLT 306.0 12/26/2012   GLUCOSE 103* 12/26/2012   ALT 14 12/26/2012   AST 14 12/26/2012   NA 138 12/26/2012   K 3.8 12/26/2012   CL 100 12/26/2012   CREATININE 0.9 12/26/2012   BUN 13 12/26/2012   CO2 33* 12/26/2012   TSH 1.722 12/23/2012   HGBA1C 6.2* 12/09/2012    Echo Study  Conclusions  - Left ventricle: The cavity size was normal. Wall thickness was increased in a pattern of moderate LVH. Systolic function was normal. The estimated ejection fraction was in the range of 55% to 60%. Wall motion was normal; there were no regional wall motion abnormalities. - Aortic valve: Trivial regurgitation. - Mitral valve: Calcified annulus. Moderate regurgitation. - Left atrium: The atrium was moderately dilated. - Right atrium: The atrium was moderately dilated. - Tricuspid valve: Moderate regurgitation. - Pulmonary arteries: PA peak pressure: 37mm Hg (S).  Assessment / Plan: 1. Atrial fib - on Pradaxa for 2 weeks - rate is still not controlled. Will try to arrange for TEE guided cardioversion next week if possible. Diltiazem 240 mg is added for rate control as well. Will stop his ACE altogether in light of the additional AV nodal blocking agents.   2. Chronic edema - has normal EF - weight is up. Lasix is increased to 80 mg BID. He is advised to take the potassium. Rechecking his labs today as well.   3. HTN - blood pressure is controlled.   4. Morbid obesity  Patient is agreeable to this plan and will call if any problems develop in the interim.   William Alvarado C. William Chiao, RN, ANP-C [facility] [address] [place], [place]  [address]  

## 2013-01-09 ENCOUNTER — Encounter (HOSPITAL_COMMUNITY): Payer: Self-pay | Admitting: *Deleted

## 2013-01-09 ENCOUNTER — Encounter (HOSPITAL_COMMUNITY)
Admission: RE | Disposition: A | Discharge status: Home / Self Care | Payer: Self-pay | Source: Ambulatory Visit | Attending: Internal Medicine

## 2013-01-09 ENCOUNTER — Encounter (HOSPITAL_COMMUNITY): Payer: Self-pay | Admitting: Anesthesiology

## 2013-01-09 ENCOUNTER — Ambulatory Visit (HOSPITAL_COMMUNITY): Payer: BC Managed Care – PPO | Admitting: Anesthesiology

## 2013-01-09 ENCOUNTER — Ambulatory Visit (HOSPITAL_COMMUNITY)
Admission: RE | Admit: 2013-01-09 | Discharge: 2013-01-09 | Disposition: A | Discharge status: Home / Self Care | Payer: BC Managed Care – PPO | Source: Ambulatory Visit | Attending: Internal Medicine | Admitting: Internal Medicine

## 2013-01-09 DIAGNOSIS — Z79899 Other long term (current) drug therapy: Secondary | ICD-10-CM | POA: Insufficient documentation

## 2013-01-09 DIAGNOSIS — Q211 Atrial septal defect: Secondary | ICD-10-CM | POA: Insufficient documentation

## 2013-01-09 DIAGNOSIS — E119 Type 2 diabetes mellitus without complications: Secondary | ICD-10-CM | POA: Insufficient documentation

## 2013-01-09 DIAGNOSIS — Q2111 Secundum atrial septal defect: Secondary | ICD-10-CM | POA: Insufficient documentation

## 2013-01-09 DIAGNOSIS — I10 Essential (primary) hypertension: Secondary | ICD-10-CM | POA: Insufficient documentation

## 2013-01-09 DIAGNOSIS — R011 Cardiac murmur, unspecified: Secondary | ICD-10-CM | POA: Insufficient documentation

## 2013-01-09 DIAGNOSIS — Z7901 Long term (current) use of anticoagulants: Secondary | ICD-10-CM | POA: Insufficient documentation

## 2013-01-09 DIAGNOSIS — I4891 Unspecified atrial fibrillation: Secondary | ICD-10-CM

## 2013-01-09 DIAGNOSIS — Z8249 Family history of ischemic heart disease and other diseases of the circulatory system: Secondary | ICD-10-CM | POA: Insufficient documentation

## 2013-01-09 DIAGNOSIS — F17200 Nicotine dependence, unspecified, uncomplicated: Secondary | ICD-10-CM | POA: Insufficient documentation

## 2013-01-09 DIAGNOSIS — Z6841 Body Mass Index (BMI) 40.0 and over, adult: Secondary | ICD-10-CM | POA: Insufficient documentation

## 2013-01-09 HISTORY — PX: CARDIOVERSION: SHX1299

## 2013-01-09 HISTORY — PX: TEE WITHOUT CARDIOVERSION: SHX5443

## 2013-01-09 LAB — BASIC METABOLIC PANEL
BUN: 17 mg/dL (ref 6–23)
Calcium: 9 mg/dL (ref 8.4–10.5)
Creatinine, Ser: 0.97 mg/dL (ref 0.50–1.35)
GFR calc Af Amer: >90 mL/min (ref >90)
GFR calc non Af Amer: 89 mL/min — ABNORMAL LOW (ref >90)
Glucose, Bld: 120 mg/dL — ABNORMAL HIGH (ref 70–99)
Potassium: 3.8 mEq/L (ref 3.5–5.1)

## 2013-01-09 SURGERY — ECHOCARDIOGRAM, TRANSESOPHAGEAL
Anesthesia: Moderate Sedation

## 2013-01-09 MED ORDER — FENTANYL CITRATE 0.05 MG/ML IJ SOLN
INTRAMUSCULAR | Status: AC
  Filled 2013-01-09: qty 2

## 2013-01-09 MED ORDER — SODIUM CHLORIDE 0.9 % IV SOLN
INTRAVENOUS | Status: DC | PRN
  Administered 2013-01-09: 15:00:00 via INTRAVENOUS

## 2013-01-09 MED ORDER — FLUMAZENIL 0.5 MG/5ML IV SOLN
INTRAVENOUS | Status: AC
  Filled 2013-01-09: qty 5

## 2013-01-09 MED ORDER — SODIUM CHLORIDE 0.9 % IV SOLN: INTRAVENOUS | Status: DC

## 2013-01-09 MED ORDER — LIDOCAINE VISCOUS 2 % MT SOLN
OROMUCOSAL | Status: DC | PRN
  Administered 2013-01-09: 20 mL via OROMUCOSAL

## 2013-01-09 MED ORDER — MIDAZOLAM HCL 10 MG/2ML IJ SOLN
INTRAMUSCULAR | Status: DC | PRN
  Administered 2013-01-09 (×2): 2 mg via INTRAVENOUS

## 2013-01-09 MED ORDER — MIDAZOLAM HCL 5 MG/ML IJ SOLN
INTRAMUSCULAR | Status: AC
  Filled 2013-01-09: qty 2

## 2013-01-09 MED ORDER — PROPOFOL 10 MG/ML IV BOLUS
INTRAVENOUS | Status: DC | PRN
  Administered 2013-01-09: 40 mg via INTRAVENOUS

## 2013-01-09 MED ORDER — FENTANYL CITRATE 0.05 MG/ML IJ SOLN
INTRAMUSCULAR | Status: DC | PRN
  Administered 2013-01-09: 12.5 ug via INTRAVENOUS
  Administered 2013-01-09: 25 ug via INTRAVENOUS

## 2013-01-09 MED ORDER — LIDOCAINE VISCOUS 2 % MT SOLN
OROMUCOSAL | Status: AC
  Filled 2013-01-09: qty 15

## 2013-01-09 MED ORDER — DEXTROSE-NACL 5-0.45 % IV SOLN: INTRAVENOUS | Status: DC

## 2013-01-09 MED ORDER — LIDOCAINE HCL (CARDIAC) 20 MG/ML IV SOLN
INTRAVENOUS | Status: DC | PRN
  Administered 2013-01-09: 30 mg via INTRAVENOUS

## 2013-01-09 NOTE — Anesthesia Postprocedure Evaluation (Signed)
  Anesthesia Post-op Note  Patient: William Alvarado  Procedure(s) Performed: Procedure(s): TRANSESOPHAGEAL ECHOCARDIOGRAM (TEE) (N/A) CARDIOVERSION (N/A)  Patient Location: Endoscopy Unit  Anesthesia Type:General  Level of Consciousness: responds to stimulation  Airway and Oxygen Therapy: Patient Spontanous Breathing and Patient connected to nasal cannula oxygen  Post-op Pain: none  Post-op Assessment: Post-op Vital signs reviewed, PATIENT'S CARDIOVASCULAR STATUS UNSTABLE, RESPIRATORY FUNCTION UNSTABLE, Patent Airway and No signs of Nausea or vomiting  Post-op Vital Signs: Reviewed and stable  Complications: No apparent anesthesia complications

## 2013-01-09 NOTE — Progress Notes (Signed)
  Echocardiogram Echocardiogram Transesophageal has been performed.  Nnaemeka Samson 01/09/2013, 3:36 PM

## 2013-01-09 NOTE — Op Note (Signed)
Full report to follow 

## 2013-01-09 NOTE — Anesthesia Preprocedure Evaluation (Signed)
Anesthesia Evaluation  Patient identified by MRN, date of birth, ID band Patient unresponsive  General Assessment Comment:Pt already sedated and unresponsive upon my arrival to endoscopy suite.  Reviewed: Allergy & Precautions, H&P , NPO status , Patient's Chart, lab work & pertinent test results  Airway Mallampati: III  Neck ROM: limited    Dental   Pulmonary          Cardiovascular hypertension, + dysrhythmias Atrial Fibrillation     Neuro/Psych    GI/Hepatic   Endo/Other  diabetes, Type obesity  Renal/GU      Musculoskeletal   Abdominal   Peds  Hematology   Anesthesia Other Findings   Reproductive/Obstetrics                           Anesthesia Physical Anesthesia Plan  ASA: III  Anesthesia Plan: MAC   Post-op Pain Management:    Induction: Intravenous  Airway Management Planned: Mask  Additional Equipment:   Intra-op Plan:   Post-operative Plan:   Informed Consent: I have reviewed the patients History and Physical, chart, labs and discussed the procedure including the risks, benefits and alternatives for the proposed anesthesia with the patient or authorized representative who has indicated his/her understanding and acceptance.     Plan Discussed with: CRNA, Anesthesiologist and Surgeon  Anesthesia Plan Comments:         Anesthesia Quick Evaluation

## 2013-01-09 NOTE — Transfer of Care (Signed)
Immediate Anesthesia Transfer of Care Note  Patient: William Alvarado  Procedure(s) Performed: Procedure(s): TRANSESOPHAGEAL ECHOCARDIOGRAM (TEE) (N/A) CARDIOVERSION (N/A)  Patient Location: Endoscopy Unit  Anesthesia Type:General  Level of Consciousness: responds to stimulation  Airway & Oxygen Therapy: Patient Spontanous Breathing and Patient connected to face mask oxygen  Post-op Assessment: Report given to PACU RN, Post -op Vital signs reviewed and stable and Patient moving all extremities  Post vital signs: Reviewed and stable  Complications: No apparent anesthesia complications

## 2013-01-09 NOTE — H&P (View-Only) (Signed)
William Alvarado Date of Birth: Feb 19, 1951 Medical Record #[id number]  History of Present Illness: William Alvarado is seen back today for a follow up visit. It is an approximate 10 day check. Seen for Dr. Jens Som. Has had new onset atrial fib. On Pradaxa. Has normal LV function by echo in April of 2014 with moderate MR, trace AI, moderate TR and moderate biatrial enlargement. Normal TSH. Has chronic pedal edema.   Seen earlier this month. Some volume overload and his rate was not controlled.Lasix was increased along with his dose of Metoprolol. Patient did not want TEE guided cardioversion but was opting for plain cardioversion after sufficient anticoagulation. Plan was for Tikosyn if he fails to maintain sinus rhythm following cardioversion.   He comes in today. He is here with his wife. He remains in atrial fib. He is really not aware but has volume overload. More swelling reported. Weight is up 10 pounds. Has DOE. Using salt (cans of soup). Remains morbidly obese. Now interested in proceeding on with TEE guided cardioversion instead of the plain cardioversion which was planned for the 28th. Has been on his Pradaxa 2 weeks as of today. No missed doses. No bleeding. Not taking his potassium - wife is trying to supplement "naturally".   Current Outpatient Prescriptions on File Prior to Visit  Medication Sig Dispense Refill  . dabigatran (PRADAXA) 150 MG CAPS Take 150 mg by mouth every 12 (twelve) hours.      . fish oil-omega-3 fatty acids 1000 MG capsule Take 1 g by mouth daily.      . furosemide (LASIX) 40 MG tablet One and one half tablets twice daily  90 tablet  12  . lisinopril (PRINIVIL,ZESTRIL) 5 MG tablet Take 1 tablet (5 mg total) by mouth daily.  30 tablet  12  . metFORMIN (GLUCOPHAGE) 1000 MG tablet Take 1,000 mg by mouth 2 (two) times daily with a meal.      . metoprolol tartrate (LOPRESSOR) 50 MG tablet Take 1 tablet (50 mg total) by mouth 2 (two) times daily.  60 tablet  12  . potassium  chloride SA (K-DUR,KLOR-CON) 20 MEQ tablet Take 1 tablet (20 mEq total) by mouth daily.  90 tablet  3   No current facility-administered medications on file prior to visit.    No Known Allergies  Past Medical History  Diagnosis Date  . Hypertension   . Allergy     allergic rhinitis  . Systolic murmur   . Colon polyps   . Diabetes mellitus without complication   . Degenerative disc disease, lumbar   . Atrial fibrillation     History reviewed. No pertinent past surgical history.  History  Smoking status  . Current Every Day Smoker -- 1.50 packs/day  . Types: Cigarettes  Smokeless tobacco  . Never Used    History  Alcohol Use No    Family History  Problem Relation Age of Onset  . Cancer Mother 42    breast  . Heart disease Father 39    MI  . Cancer Brother     lung  . Stroke Brother 45    Review of Systems: The review of systems is per the HPI.  All other systems were reviewed and are negative.  Physical Exam: BP 116/84  Pulse 109  Ht 6\' 1"  (1.854 m)  Wt 325 lb 6 oz (147.589 kg)  BMI 42.94 kg/m2  SpO2 94% Patient is very pleasant and in no acute distress. Skin is warm and dry. Color  is normal.  HEENT is unremarkable. Normocephalic/atraumatic. PERRL. Sclera are nonicteric. Neck is supple. No masses. No JVD. Lungs are clear. Cardiac exam shows an irregular rate and rhythm. Rate is a little fast today. Abdomen is soft. Extremities are with pedal edema. Gait and ROM are intact. No gross neurologic deficits noted.  LABORATORY DATA: EKG today shows atrial fib with uncontrolled VR. Rate is 109  Lab Results  Component Value Date   WBC 9.0 12/26/2012   HGB 14.3 12/26/2012   HCT 44.1 12/26/2012   PLT 306.0 12/26/2012   GLUCOSE 103* 12/26/2012   ALT 14 12/26/2012   AST 14 12/26/2012   NA 138 12/26/2012   K 3.8 12/26/2012   CL 100 12/26/2012   CREATININE 0.9 12/26/2012   BUN 13 12/26/2012   CO2 33* 12/26/2012   TSH 1.722 12/23/2012   HGBA1C 6.2* 12/09/2012    Echo Study  Conclusions  - Left ventricle: The cavity size was normal. Wall thickness was increased in a pattern of moderate LVH. Systolic function was normal. The estimated ejection fraction was in the range of 55% to 60%. Wall motion was normal; there were no regional wall motion abnormalities. - Aortic valve: Trivial regurgitation. - Mitral valve: Calcified annulus. Moderate regurgitation. - Left atrium: The atrium was moderately dilated. - Right atrium: The atrium was moderately dilated. - Tricuspid valve: Moderate regurgitation. - Pulmonary arteries: PA peak pressure: 37mm Hg (S).  Assessment / Plan: 1. Atrial fib - on Pradaxa for 2 weeks - rate is still not controlled. Will try to arrange for TEE guided cardioversion next week if possible. Diltiazem 240 mg is added for rate control as well. Will stop his ACE altogether in light of the additional AV nodal blocking agents.   2. Chronic edema - has normal EF - weight is up. Lasix is increased to 80 mg BID. He is advised to take the potassium. Rechecking his labs today as well.   3. HTN - blood pressure is controlled.   4. Morbid obesity  Patient is agreeable to this plan and will call if any problems develop in the interim.   William Macadamia, RN, ANP-C [facility] [address] [place], [place]  [address]

## 2013-01-09 NOTE — Op Note (Signed)
Patient anesthetized with 40 mg Propofol  With pads in apex/base position patient converted to SR with 200 J synchronized biphasic energy.

## 2013-01-09 NOTE — Interval H&P Note (Signed)
History and Physical Interval Note:  01/09/2013 2:39 PM  William Alvarado  has presented today for surgery, with the diagnosis of stones  The various methods of treatment have been discussed with the patient and family. After consideration of risks, benefits and other options for treatment, the patient has consented to  Procedure(s): TRANSESOPHAGEAL ECHOCARDIOGRAM (TEE) (N/A) CARDIOVERSION (N/A) as a surgical intervention .  The patient's history has been reviewed, patient examined, no change in status, stable for surgery.  I have reviewed the patient's chart and labs.  Questions were answered to the patient's satisfaction.     William Alvarado

## 2013-01-10 ENCOUNTER — Encounter (HOSPITAL_COMMUNITY): Payer: Self-pay | Admitting: Internal Medicine

## 2013-01-10 ENCOUNTER — Telehealth: Payer: Self-pay | Admitting: Family Medicine

## 2013-01-10 NOTE — Telephone Encounter (Signed)
Fluid in legs and abdomen taking 4 lasix per day since fridayh and not doing any good. Had his heart shocked yesterday in A-fib since April 4 shocked it back into rhythmn. What do you recommend? States BP is good, just concerned about him not urinating like he should, urinatng every 5-6 hrs on lasix pt feels like something is wrong

## 2013-01-10 NOTE — Telephone Encounter (Signed)
° °  ° °  ° °  ° °  Telephone Encounter William Alvarado (MR# [id number])         Telephone Encounter Info    Author Note Status Last Update User Last Update Date/Time   Samuella Cota Signed Samuella Cota 01/10/2013 4:22 PM             Fluid in legs and abdomen taking 4 lasix per day since fridayh and not doing any good. Had his heart shocked yesterday in A-fib since April 4 shocked it back into rhythmn. What do you recommend? States BP is good, just concerned about him not urinating like he should, urinatng every 5-6 hrs on lasix pt feels like something is wrong

## 2013-01-10 NOTE — Telephone Encounter (Signed)
Work in tomorrow

## 2013-01-11 ENCOUNTER — Ambulatory Visit (INDEPENDENT_AMBULATORY_CARE_PROVIDER_SITE_OTHER): Payer: BC Managed Care – PPO | Admitting: Family Medicine

## 2013-01-11 ENCOUNTER — Telehealth: Payer: Self-pay | Admitting: Cardiology

## 2013-01-11 ENCOUNTER — Encounter: Payer: Self-pay | Admitting: Family Medicine

## 2013-01-11 VITALS — BP 100/70 | HR 74 | Temp 98.0°F | Resp 22 | Wt 328.0 lb

## 2013-01-11 DIAGNOSIS — R609 Edema, unspecified: Secondary | ICD-10-CM

## 2013-01-11 DIAGNOSIS — R601 Generalized edema: Secondary | ICD-10-CM

## 2013-01-11 DIAGNOSIS — I4891 Unspecified atrial fibrillation: Secondary | ICD-10-CM

## 2013-01-11 LAB — URINALYSIS, MICROSCOPIC ONLY
Bacteria, UA: NONE SEEN
Crystals: NONE SEEN
Squamous Epithelial / LPF: NONE SEEN
WBC, UA: NONE SEEN WBC/hpf (ref <3)

## 2013-01-11 LAB — URINALYSIS, ROUTINE W REFLEX MICROSCOPIC
Bilirubin Urine: NEGATIVE
Glucose, UA: NEGATIVE mg/dL
Ketones, ur: NEGATIVE mg/dL
Protein, ur: NEGATIVE mg/dL
Urobilinogen, UA: 2 mg/dL — ABNORMAL HIGH (ref 0.0–1.0)

## 2013-01-11 MED ORDER — BUMETANIDE 2 MG PO TABS: 2.0000 mg | ORAL_TABLET | Freq: Every day | ORAL | Status: DC

## 2013-01-11 MED ORDER — METOLAZONE 10 MG PO TABS: ORAL_TABLET | ORAL | Status: DC

## 2013-01-11 MED ORDER — DABIGATRAN ETEXILATE MESYLATE 150 MG PO CAPS: 150.0000 mg | ORAL_CAPSULE | Freq: Two times a day (BID) | ORAL | Status: DC

## 2013-01-11 NOTE — Telephone Encounter (Signed)
New problem   Pt need to know what he is set up for at the hospital. Pt didn't know what he is going to hospital for. Please call pt.

## 2013-01-11 NOTE — Progress Notes (Signed)
Subjective:    Patient ID: William Alvarado, male    DOB: November 04, 1950, 62 y.o.   MRN: [id number]  HPI I reviewed the last office visit. Since that time the patient has had an echocardiogram which revealed an ejection fraction greater than 60%. He is undergone TEE cardioversion. He is currently in normal sinus rhythm. However he and his wife are extremely concerned. He has gained almost 14 pounds in essentially fluid over the last month. His legs are tight swollen and weeping with edema. His abdomen is increasing in girth.  He is having increasing shortness of breath. His pulse ox today in the office is 95% on room air. His Lasix is continually been increased and is now 80 mg twice a day. However even after quadruple in his Lasix, his edema is worsening.  Liver function tests and creatinine have remained normal. Past Medical History  Diagnosis Date  . Hypertension   . Allergy     allergic rhinitis  . Systolic murmur   . Colon polyps   . Diabetes mellitus without complication   . Degenerative disc disease, lumbar   . Atrial fibrillation    Current Outpatient Prescriptions on File Prior to Visit  Medication Sig Dispense Refill  . fish oil-omega-3 fatty acids 1000 MG capsule Take 1 g by mouth daily.      . metFORMIN (GLUCOPHAGE) 1000 MG tablet Take 1,000 mg by mouth 2 (two) times daily with a meal.      . metoprolol tartrate (LOPRESSOR) 50 MG tablet Take 1 tablet (50 mg total) by mouth 2 (two) times daily.  60 tablet  12  . potassium chloride SA (K-DUR,KLOR-CON) 20 MEQ tablet Take 1 tablet (20 mEq total) by mouth daily.  90 tablet  3   No current facility-administered medications on file prior to visit.   No Known Allergies History   Social History  . Marital Status: Married    Spouse Name: N/A    Number of Children: 2  . Years of Education: N/A   Occupational History  .      Machinist   Social History Main Topics  . Smoking status: Current Some Day Smoker -- 1.50 packs/day   Types: Cigarettes  . Smokeless tobacco: Never Used  . Alcohol Use: No  . Drug Use: No  . Sexually Active: Not Currently   Other Topics Concern  . Not on file   Social History Narrative  . No narrative on file       Review of Systems  All other systems reviewed and are negative.       Objective:   Physical Exam  Constitutional: He appears well-developed and well-nourished.  Neck: Neck supple. No JVD present. No thyromegaly present.  Cardiovascular: Normal rate, regular rhythm, normal heart sounds and intact distal pulses.  Exam reveals no gallop and no friction rub.   No murmur heard. Pulmonary/Chest: Effort normal. He has rales (He has faint bibasilar rales).  Abdominal: Soft. Bowel sounds are normal. He exhibits distension. There is no tenderness. There is no rebound and no guarding.  Lymphadenopathy:    He has no cervical adenopathy.   he has +2 weeping edema in both legs to the level of the knees with venous stasis changes and erythema in the shins.        Assessment & Plan:  1. Anasarca Rule out other causes of anasarca including with an ultrasound of the liver to rule out cirrhosis and a urinalysis to rule out nephrotic syndrome.  The patient may be having a difficult time absorbing Lasix due to edema in the bowel.  Therefore I am going to switch to Bumex 2 mg tablets once a day. We will try to time the pump" with metolazone 10 mg tablets 30 minutes prior to Bumex. Also put the patient in unna boots bilaterally and advised him to elevate his legs to facilitate reabsorption of his peripheral edema. Will see the patient back in 48 hours for evaluation. Advised to go to the hospital if he developed shortness of breath or other signs of pulmonary edema. - metolazone (ZAROXOLYN) 10 MG tablet; Take 1 tab 30 minutes before bumex.  Dispense: 30 tablet; Refill: 0 - bumetanide (BUMEX) 2 MG tablet; Take 1 tablet (2 mg total) by mouth daily.  Dispense: 30 tablet; Refill: 0 - US  Abdomen Complete; Future - Urinalysis, Routine w reflex microscopic  2. Atrial fibrillation Currently in normal sinus rhythm. He remains on anticoagulation

## 2013-01-11 NOTE — Telephone Encounter (Signed)
Left message for pt to call.

## 2013-01-11 NOTE — Telephone Encounter (Signed)
Spoke with pt wife, the pt had a TEE cardioversion on 01-09-13 and the hosp is still calling because he is scheduled for a procedure on 01-16-13. Called over to endo. Procedure scheduled for 01-16-13 canceled.

## 2013-01-11 NOTE — Telephone Encounter (Signed)
Follow up  ° ° ° °Returning call back to nurse  °

## 2013-01-12 ENCOUNTER — Ambulatory Visit: Payer: Self-pay | Admitting: Family Medicine

## 2013-01-13 ENCOUNTER — Other Ambulatory Visit: Payer: Self-pay | Admitting: Family Medicine

## 2013-01-13 ENCOUNTER — Encounter: Payer: Self-pay | Admitting: Family Medicine

## 2013-01-13 ENCOUNTER — Ambulatory Visit (INDEPENDENT_AMBULATORY_CARE_PROVIDER_SITE_OTHER): Payer: BC Managed Care – PPO | Admitting: Family Medicine

## 2013-01-13 VITALS — BP 120/68 | HR 82 | Temp 98.3°F | Resp 22 | Wt 302.0 lb

## 2013-01-13 DIAGNOSIS — R601 Generalized edema: Secondary | ICD-10-CM

## 2013-01-13 DIAGNOSIS — R609 Edema, unspecified: Secondary | ICD-10-CM

## 2013-01-13 NOTE — Progress Notes (Signed)
Subjective:    Patient ID: William Alvarado, male    DOB: [birth year], 62 y.o.   MRN: [id number]  HPI  I reviewed the last office visit. Since that time the patient has had an echocardiogram which revealed an ejection fraction greater than 60%. He is undergone TEE cardioversion. He is currently in normal sinus rhythm. However he and his wife are extremely concerned. He has gained almost 14 pounds in essentially fluid over the last month. His legs are tight swollen and weeping with edema. His abdomen is increasing in girth.  He is having increasing shortness of breath. His pulse ox today in the office is 95% on room air. His Lasix is continually been increased and is now 80 mg twice a day. However even after quadruple in his Lasix, his edema is worsening.  Liver function tests and creatinine have remained normal. The patient may be having a difficult time absorbing Lasix due to edema in the bowel.  Therefore I am going to switch to Bumex 2 mg tablets once a day. We will try to "prime the pump" with metolazone 10 mg tablets 30 minutes prior to Bumex. Also put the patient in unna boots bilaterally and advised him to elevate his legs to facilitate reabsorption of his peripheral edema. Will see the patient back in 48 hours for evaluation.  01/12/13 Patient has lost 20 pounds in 2 days.  His legs are no longer swollen.  His ascites is improved.  He no longer has shortness of breath.  The Unna boots are now loose. Past Medical History  Diagnosis Date  . Hypertension   . Allergy     allergic rhinitis  . Systolic murmur   . Colon polyps   . Diabetes mellitus without complication   . Degenerative disc disease, lumbar   . Atrial fibrillation    Current Outpatient Prescriptions on File Prior to Visit  Medication Sig Dispense Refill  . bumetanide (BUMEX) 2 MG tablet Take 1 tablet (2 mg total) by mouth daily.  30 tablet  0  . dabigatran (PRADAXA) 150 MG CAPS Take 1 capsule (150 mg total) by mouth every 12  (twelve) hours.  60 capsule  3  . diltiazem (CARDIZEM CD) 240 MG 24 hr capsule Take 240 mg by mouth daily.      . fish oil-omega-3 fatty acids 1000 MG capsule Take 1 g by mouth daily.      . metFORMIN (GLUCOPHAGE) 1000 MG tablet Take 1,000 mg by mouth 2 (two) times daily with a meal.      . metolazone (ZAROXOLYN) 10 MG tablet Take 1 tab 30 minutes before bumex.  30 tablet  0  . metoprolol tartrate (LOPRESSOR) 50 MG tablet Take 1 tablet (50 mg total) by mouth 2 (two) times daily.  60 tablet  12  . potassium chloride SA (K-DUR,KLOR-CON) 20 MEQ tablet Take 1 tablet (20 mEq total) by mouth daily.  90 tablet  3   No current facility-administered medications on file prior to visit.   No Known Allergies History   Social History  . Marital Status: Married    Spouse Name: N/A    Number of Children: 2  . Years of Education: N/A   Occupational History  .      Machinist   Social History Main Topics  . Smoking status: Current Some Day Smoker -- 1.50 packs/day    Types: Cigarettes  . Smokeless tobacco: Never Used  . Alcohol Use: No  . Drug Use: No  .  Sexually Active: Not Currently   Other Topics Concern  . Not on file   Social History Narrative  . No narrative on file       Review of Systems  All other systems reviewed and are negative.       Objective:   Physical Exam  Constitutional: He appears well-developed and well-nourished.  Neck: Neck supple. No JVD present. No thyromegaly present.  Cardiovascular: Normal rate, regular rhythm, normal heart sounds and intact distal pulses.  Exam reveals no gallop and no friction rub.   No murmur heard. Pulmonary/Chest: Effort normal. He has no rales (He has faint bibasilar rales).  Abdominal: Soft. Bowel sounds are normal. He exhibits no distension. There is no tenderness. There is no rebound and no guarding.  Lymphadenopathy:    He has no cervical adenopathy.   he has trace edema in both legs He has no rales on exam       Assessment & Plan:  1. Edema Markedly improved.  Discontinue metolazone.  Continue Bumex 2 mg by mouth daily. Recheck BMP to ensure that have not caused hypokalemia.  If his weight continues to drop we may need to decrease the dose of Bumex. - Basic Metabolic Panel

## 2013-01-14 LAB — BASIC METABOLIC PANEL
BUN: 15 mg/dL (ref 6–23)
Creat: 0.99 mg/dL (ref 0.50–1.35)
Potassium: 3.6 mEq/L (ref 3.5–5.3)

## 2013-01-16 ENCOUNTER — Other Ambulatory Visit: Payer: BC Managed Care – PPO

## 2013-01-16 ENCOUNTER — Encounter (HOSPITAL_COMMUNITY): Admission: RE | Payer: Self-pay | Source: Ambulatory Visit

## 2013-01-16 ENCOUNTER — Ambulatory Visit (HOSPITAL_COMMUNITY): Admission: RE | Admit: 2013-01-16 | Payer: BC Managed Care – PPO | Source: Ambulatory Visit | Admitting: Cardiology

## 2013-01-16 SURGERY — CARDIOVERSION
Anesthesia: Monitor Anesthesia Care

## 2013-01-18 ENCOUNTER — Encounter: Payer: Self-pay | Admitting: Family Medicine

## 2013-01-26 ENCOUNTER — Telehealth: Payer: Self-pay | Admitting: Cardiology

## 2013-01-26 NOTE — Telephone Encounter (Signed)
Spoke with pt wife, they are unable to afford the pradaxa. Savings card and samples placed at the front desk for pick up.

## 2013-01-26 NOTE — Telephone Encounter (Signed)
New Prob      Has some questions regarding PRADAXA. Would like to speak to nurse.

## 2013-01-31 ENCOUNTER — Telehealth: Payer: Self-pay | Admitting: Family Medicine

## 2013-02-03 ENCOUNTER — Encounter: Payer: Self-pay | Admitting: Family Medicine

## 2013-02-03 NOTE — Progress Notes (Unsigned)
Patient ID: William Alvarado, male   DOB: [birth year], 62 y.o.   MRN: [id number] Pt came in stating he needed work note saying that he can go back to work without restrictions I wrote note and gave to him without limitations per [place].

## 2013-02-08 ENCOUNTER — Other Ambulatory Visit: Payer: Self-pay | Admitting: Family Medicine

## 2013-02-08 NOTE — Telephone Encounter (Signed)
Med rf °

## 2013-02-10 ENCOUNTER — Ambulatory Visit: Payer: BC Managed Care – PPO | Admitting: Cardiology

## 2013-03-03 ENCOUNTER — Telehealth: Payer: Self-pay | Admitting: Family Medicine

## 2013-03-03 DIAGNOSIS — I4891 Unspecified atrial fibrillation: Secondary | ICD-10-CM

## 2013-03-03 MED ORDER — DABIGATRAN ETEXILATE MESYLATE 150 MG PO CAPS: 150.0000 mg | ORAL_CAPSULE | Freq: Two times a day (BID) | ORAL | Status: DC

## 2013-03-03 NOTE — Telephone Encounter (Signed)
Rx Refilled  

## 2013-03-15 NOTE — Telephone Encounter (Signed)
This was an error

## 2013-04-09 ENCOUNTER — Other Ambulatory Visit: Payer: Self-pay | Admitting: Family Medicine

## 2013-04-11 ENCOUNTER — Telehealth: Payer: Self-pay | Admitting: Family Medicine

## 2013-04-11 NOTE — Telephone Encounter (Signed)
We do not have any samples at this time and pt aware  

## 2013-04-17 ENCOUNTER — Other Ambulatory Visit: Payer: Self-pay | Admitting: Family Medicine

## 2013-04-21 ENCOUNTER — Ambulatory Visit: Payer: BC Managed Care – PPO | Admitting: Cardiology

## 2013-05-12 ENCOUNTER — Telehealth: Payer: Self-pay | Admitting: Family Medicine

## 2013-05-12 DIAGNOSIS — I4891 Unspecified atrial fibrillation: Secondary | ICD-10-CM

## 2013-05-12 MED ORDER — DILTIAZEM HCL ER COATED BEADS 240 MG PO CP24: 240.0000 mg | ORAL_CAPSULE | Freq: Every day | ORAL | Status: DC

## 2013-05-12 NOTE — Telephone Encounter (Signed)
Diltiaz ER (CD) 240/24 cap 1 QD #30

## 2013-05-12 NOTE — Telephone Encounter (Signed)
Rx Refilled  

## 2013-06-23 ENCOUNTER — Ambulatory Visit: Payer: BC Managed Care – PPO | Admitting: Cardiology

## 2013-10-31 ENCOUNTER — Other Ambulatory Visit: Payer: Self-pay | Admitting: Family Medicine

## 2013-11-22 ENCOUNTER — Other Ambulatory Visit: Payer: Self-pay | Admitting: Cardiology

## 2013-11-22 ENCOUNTER — Other Ambulatory Visit: Payer: Self-pay | Admitting: Family Medicine

## 2013-11-24 ENCOUNTER — Other Ambulatory Visit: Payer: Self-pay | Admitting: Cardiology

## 2013-11-28 ENCOUNTER — Other Ambulatory Visit: Payer: Self-pay | Admitting: Cardiology

## 2013-11-30 ENCOUNTER — Encounter: Payer: Self-pay | Admitting: Family Medicine

## 2013-11-30 ENCOUNTER — Ambulatory Visit (INDEPENDENT_AMBULATORY_CARE_PROVIDER_SITE_OTHER): Payer: BC Managed Care – PPO | Admitting: Family Medicine

## 2013-11-30 VITALS — BP 120/74 | HR 62 | Temp 100.4°F | Resp 22 | Ht 73.0 in | Wt 300.0 lb

## 2013-11-30 DIAGNOSIS — J441 Chronic obstructive pulmonary disease with (acute) exacerbation: Secondary | ICD-10-CM

## 2013-11-30 MED ORDER — POTASSIUM CHLORIDE CRYS ER 20 MEQ PO TBCR: EXTENDED_RELEASE_TABLET | ORAL | Status: DC

## 2013-11-30 MED ORDER — ALBUTEROL SULFATE HFA 108 (90 BASE) MCG/ACT IN AERS: 2.0000 | INHALATION_SPRAY | Freq: Four times a day (QID) | RESPIRATORY_TRACT | Status: DC | PRN

## 2013-11-30 MED ORDER — PREDNISONE 20 MG PO TABS: ORAL_TABLET | ORAL | Status: DC

## 2013-11-30 MED ORDER — AZITHROMYCIN 250 MG PO TABS: ORAL_TABLET | ORAL | Status: DC

## 2013-11-30 NOTE — Progress Notes (Signed)
Subjective:    Patient ID: William Alvarado, male    DOB: [birth year], 63 y.o.   MRN: [id number]  HPI  Patient is a long-term smoker.  Beginning Saturday he developed a cough and some shortness of breath. The cough is productive of green sputum. Shortness of breath is worsening. He is audibly wheezing on exam and running a low-grade fever to 100.4. He denies chest pain or angina. He has no palpitations. He is in no respiratory distress. 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 Past Medical History  Diagnosis Date  . Hypertension   . Allergy     allergic rhinitis  . Systolic murmur   . Colon polyps   . Diabetes mellitus without complication   . Degenerative disc disease, lumbar   . Atrial fibrillation    Current Outpatient Prescriptions on File Prior to Visit  Medication Sig Dispense Refill  . bumetanide (BUMEX) 1 MG tablet TAKE 2 TABLETS BY MOUTH EVERY DAY  60 tablet  5  . dabigatran (PRADAXA) 150 MG CAPS Take 1 capsule (150 mg total) by mouth every 12 (twelve) hours.  60 capsule  3  . diltiazem (CARDIZEM CD) 240 MG 24 hr capsule TAKE ONE CAPSULE BY MOUTH ONCE DAILY  30 capsule  5  . fish oil-omega-3 fatty acids 1000 MG capsule Take 1 g by mouth daily.      . metFORMIN (GLUCOPHAGE) 1000 MG tablet TAKE ONE TABLET BY MOUTH TWICE DAILY  60 tablet  0  . metoprolol tartrate (LOPRESSOR) 50 MG tablet Take 1 tablet (50 mg total) by mouth 2 (two) times daily.  60 tablet  12   No current facility-administered medications on file prior to visit.   No Known Allergies History   Social History  . Marital Status: Married    Spouse Name: N/A    Number of Children: 2  . Years of Education: N/A   Occupational History  .      Machinist   Social History Main Topics  . Smoking status: Current Some Day Smoker -- 1.50 packs/day    Types: Cigarettes  . Smokeless tobacco: Never Used  . Alcohol Use: No  . Drug  Use: No  . Sexual Activity: Not Currently   Other Topics Concern  . Not on file   Social History Narrative  . No narrative on file     Review of Systems  All other systems reviewed and are negative.       Objective:   Physical Exam  Vitals reviewed. Constitutional: He appears well-developed and well-nourished. No distress.  HENT:  Right Ear: External ear normal.  Left Ear: External ear normal.  Nose: Nose normal.  Mouth/Throat: Oropharynx is clear and moist. No oropharyngeal exudate.  Neck: Neck supple. No thyromegaly present.  Cardiovascular: Normal rate and normal heart sounds.   Pulmonary/Chest: Effort normal. No respiratory distress. He has wheezes. He has no rales. He exhibits no tenderness.  Abdominal: Soft. Bowel sounds are normal.  Lymphadenopathy:    He has no cervical adenopathy.  Skin: He is not diaphoretic.          Assessment & Plan:  1. COPD exacerbation Advise the patient quit smoking this will become a permanent situation for him. I will start him on prednisone taper pack. I will start patient on Z-Pak. Again the patient prescription for albuterol 2 puffs inhaled every 6 hours as needed for wheezing.  We'll need to monitor for tachycardia on albuterol. Recheck in 4 to hours if no better or sooner if worse. - azithromycin (ZITHROMAX) 250 MG tablet; 2 tabs poqday1, 1 tab poqday 2-5  Dispense: 6 tablet; Refill: 0 - predniSONE (DELTASONE) 20 MG tablet; 3 tabs poqday 1-2, 2 tabs poqday 3-4, 1 tab poqday 5-6  Dispense: 12 tablet; Refill: 0 - albuterol (PROVENTIL HFA;VENTOLIN HFA) 108 (90 BASE) MCG/ACT inhaler; Inhale 2 puffs into the lungs every 6 (six) hours as needed for wheezing or shortness of breath.  Dispense: 1 Inhaler; Refill: 0

## 2013-12-01 ENCOUNTER — Telehealth: Payer: Self-pay | Admitting: Family Medicine

## 2013-12-01 ENCOUNTER — Other Ambulatory Visit: Payer: Self-pay | Admitting: Family Medicine

## 2013-12-01 DIAGNOSIS — J441 Chronic obstructive pulmonary disease with (acute) exacerbation: Secondary | ICD-10-CM

## 2013-12-01 MED ORDER — ALBUTEROL SULFATE HFA 108 (90 BASE) MCG/ACT IN AERS: 2.0000 | INHALATION_SPRAY | Freq: Four times a day (QID) | RESPIRATORY_TRACT | Status: DC | PRN

## 2013-12-01 NOTE — Telephone Encounter (Signed)
Proventil inhaler is costing $160.00.   He can not afford this!  What else can he use?

## 2013-12-01 NOTE — Telephone Encounter (Signed)
Can we switch him to any albuterol than is cheaper? Proair, ventolin, etc

## 2013-12-01 NOTE — Telephone Encounter (Signed)
Spoke to pharmacy because generic inhaler was ordered.  Pharmacy stated wife had called there as well.  Apparent price error.  Price now was down to $66.  Patient felt better but was going to call their  Insurance carrier to see which brand that maybe they paid better for.

## 2013-12-04 ENCOUNTER — Telehealth: Payer: Self-pay | Admitting: Family Medicine

## 2013-12-04 DIAGNOSIS — J441 Chronic obstructive pulmonary disease with (acute) exacerbation: Secondary | ICD-10-CM

## 2013-12-04 MED ORDER — ALBUTEROL SULFATE HFA 108 (90 BASE) MCG/ACT IN AERS: 2.0000 | INHALATION_SPRAY | Freq: Four times a day (QID) | RESPIRATORY_TRACT | Status: DC | PRN

## 2013-12-04 NOTE — Telephone Encounter (Signed)
Call back number is [phone] Pt wife is calling because  the inhaler that was given to him was to expensive and the insurance gave the wife 3 other alternatives  that could be used could any of these be called in for the husband   Pro air  Ventolin Xopenex  Pharmacy [facility] [address]

## 2013-12-04 NOTE — Telephone Encounter (Signed)
Sent in new rx for inhaler as requested by pt

## 2013-12-27 ENCOUNTER — Other Ambulatory Visit: Payer: Self-pay | Admitting: Cardiology

## 2014-01-03 ENCOUNTER — Other Ambulatory Visit: Payer: Self-pay | Admitting: Family Medicine

## 2014-01-03 ENCOUNTER — Other Ambulatory Visit: Payer: Self-pay | Admitting: Cardiology

## 2014-01-03 NOTE — Telephone Encounter (Signed)
Medication refilled per protocol. 

## 2014-01-19 ENCOUNTER — Other Ambulatory Visit: Payer: Self-pay

## 2014-01-19 DIAGNOSIS — I4891 Unspecified atrial fibrillation: Secondary | ICD-10-CM

## 2014-01-19 MED ORDER — METOPROLOL TARTRATE 50 MG PO TABS: 50.0000 mg | ORAL_TABLET | Freq: Two times a day (BID) | ORAL | Status: DC

## 2014-01-26 ENCOUNTER — Ambulatory Visit: Payer: BC Managed Care – PPO | Admitting: Family Medicine

## 2014-02-22 ENCOUNTER — Other Ambulatory Visit: Payer: Self-pay | Admitting: *Deleted

## 2014-02-22 DIAGNOSIS — E119 Type 2 diabetes mellitus without complications: Secondary | ICD-10-CM

## 2014-02-22 DIAGNOSIS — E785 Hyperlipidemia, unspecified: Secondary | ICD-10-CM

## 2014-02-22 DIAGNOSIS — I10 Essential (primary) hypertension: Secondary | ICD-10-CM

## 2014-02-23 ENCOUNTER — Other Ambulatory Visit: Payer: BC Managed Care – PPO

## 2014-02-23 DIAGNOSIS — E119 Type 2 diabetes mellitus without complications: Secondary | ICD-10-CM

## 2014-02-23 DIAGNOSIS — I10 Essential (primary) hypertension: Secondary | ICD-10-CM

## 2014-02-23 DIAGNOSIS — E785 Hyperlipidemia, unspecified: Secondary | ICD-10-CM

## 2014-02-23 LAB — CBC WITH DIFFERENTIAL/PLATELET
BASOS ABS: 0 10*3/uL (ref 0.0–0.1)
BASOS PCT: 0 % (ref 0–1)
Eosinophils Absolute: 0.3 10*3/uL (ref 0.0–0.7)
Eosinophils Relative: 3 % (ref 0–5)
HCT: 47.3 % (ref 39.0–52.0)
Hemoglobin: 15.6 g/dL (ref 13.0–17.0)
Lymphocytes Relative: 21 % (ref 12–46)
Lymphs Abs: 1.9 10*3/uL (ref 0.7–4.0)
MCH: 28.9 pg (ref 26.0–34.0)
MCHC: 33 g/dL (ref 30.0–36.0)
MCV: 87.6 fL (ref 78.0–100.0)
Monocytes Absolute: 0.8 10*3/uL (ref 0.1–1.0)
Monocytes Relative: 9 % (ref 3–12)
NEUTROS ABS: 6.2 10*3/uL (ref 1.7–7.7)
Neutrophils Relative %: 67 % (ref 43–77)
Platelets: 263 10*3/uL (ref 150–400)
RBC: 5.4 MIL/uL (ref 4.22–5.81)
RDW: 15.9 % — AB (ref 11.5–15.5)
WBC: 9.2 10*3/uL (ref 4.0–10.5)

## 2014-02-23 LAB — HEMOGLOBIN A1C
HEMOGLOBIN A1C: 7 % — AB (ref <5.7)
MEAN PLASMA GLUCOSE: 154 mg/dL — AB (ref <117)

## 2014-02-23 LAB — COMPLETE METABOLIC PANEL WITH GFR
ALT: 14 U/L (ref 0–53)
AST: 14 U/L (ref 0–37)
Albumin: 4 g/dL (ref 3.5–5.2)
Alkaline Phosphatase: 77 U/L (ref 39–117)
BILIRUBIN TOTAL: 0.7 mg/dL (ref 0.2–1.2)
BUN: 15 mg/dL (ref 6–23)
CO2: 29 meq/L (ref 19–32)
Calcium: 9.1 mg/dL (ref 8.4–10.5)
Chloride: 100 mEq/L (ref 96–112)
Creat: 1.01 mg/dL (ref 0.50–1.35)
GFR, Est Non African American: 80 mL/min
Glucose, Bld: 139 mg/dL — ABNORMAL HIGH (ref 70–99)
Potassium: 4.6 mEq/L (ref 3.5–5.3)
Sodium: 139 mEq/L (ref 135–145)
Total Protein: 6.9 g/dL (ref 6.0–8.3)

## 2014-02-23 LAB — LIPID PANEL
Cholesterol: 121 mg/dL (ref 0–200)
HDL: 29 mg/dL — ABNORMAL LOW (ref >39)
LDL CALC: 78 mg/dL (ref 0–99)
Total CHOL/HDL Ratio: 4.2 Ratio
Triglycerides: 69 mg/dL (ref <150)
VLDL: 14 mg/dL (ref 0–40)

## 2014-02-24 LAB — PSA: PSA: 0.26 ng/mL (ref <=4.00)

## 2014-03-02 ENCOUNTER — Ambulatory Visit (INDEPENDENT_AMBULATORY_CARE_PROVIDER_SITE_OTHER): Payer: BC Managed Care – PPO | Admitting: Family Medicine

## 2014-03-02 ENCOUNTER — Encounter: Payer: Self-pay | Admitting: Family Medicine

## 2014-03-02 VITALS — BP 130/74 | HR 78 | Temp 97.9°F | Resp 20 | Ht 73.0 in | Wt 300.0 lb

## 2014-03-02 DIAGNOSIS — R609 Edema, unspecified: Secondary | ICD-10-CM

## 2014-03-02 DIAGNOSIS — F17200 Nicotine dependence, unspecified, uncomplicated: Secondary | ICD-10-CM

## 2014-03-02 DIAGNOSIS — I10 Essential (primary) hypertension: Secondary | ICD-10-CM

## 2014-03-02 DIAGNOSIS — E119 Type 2 diabetes mellitus without complications: Secondary | ICD-10-CM

## 2014-03-02 DIAGNOSIS — I4891 Unspecified atrial fibrillation: Secondary | ICD-10-CM

## 2014-03-02 MED ORDER — METFORMIN HCL 1000 MG PO TABS: ORAL_TABLET | ORAL | Status: DC

## 2014-03-02 MED ORDER — METOPROLOL TARTRATE 50 MG PO TABS: 50.0000 mg | ORAL_TABLET | Freq: Two times a day (BID) | ORAL | Status: DC

## 2014-03-02 MED ORDER — DABIGATRAN ETEXILATE MESYLATE 150 MG PO CAPS: 150.0000 mg | ORAL_CAPSULE | Freq: Two times a day (BID) | ORAL | Status: DC

## 2014-03-02 MED ORDER — POTASSIUM CHLORIDE CRYS ER 20 MEQ PO TBCR: EXTENDED_RELEASE_TABLET | ORAL | Status: DC

## 2014-03-02 MED ORDER — BUMETANIDE 1 MG PO TABS: ORAL_TABLET | ORAL | Status: DC

## 2014-03-02 MED ORDER — DILTIAZEM HCL ER COATED BEADS 240 MG PO CP24: ORAL_CAPSULE | ORAL | Status: DC

## 2014-03-02 NOTE — Progress Notes (Signed)
Subjective:    Patient ID: William Alvarado, male    DOB: 1951-02-16, 63 y.o.   MRN: [id number]  HPI Patient has a history of type 2 diabetes mellitus. He's currently taking metformin 1000 mg by mouth twice a day. His hemoglobin A1c of 7.0. He is not exercising. He is not monitoring his diet. His weight is greater than 300 pounds. His blood pressure is currently well controlled 130/74.  Patient has never gone for formal pulmonary function test but he has had COPD exacerbations in the past. He is audibly wheezing today on his examination. He denies any shortness of breath. He states that his breathing is acceptable for him to perform his activities of daily living. He continues to smoke greater than one pack of cigarettes per day. Based on examination today I believe the patient is at least stage II COPD. At the present time he is not interested in long-acting bronchodilators. He is not ready to quit smoking. He also has a history of atrial fibrillation status post cardioversion. Today on examination he is in normal sinus rhythm although he does have frequent PVCs auscultated. His rate is currently well controlled. He is appropriately anticoagulated with pradaxa. Past Medical History  Diagnosis Date  . Hypertension   . Allergy     allergic rhinitis  . Systolic murmur   . Colon polyps   . Diabetes mellitus without complication   . Degenerative disc disease, lumbar   . Atrial fibrillation    Past Surgical History  Procedure Laterality Date  . Tee without cardioversion N/A 01/09/2013    Procedure: TRANSESOPHAGEAL ECHOCARDIOGRAM (TEE);  Surgeon: Pricilla RifflePaula V Ross, MD;  [facility];  Service: Cardiovascular;  Laterality: N/A;  . Cardioversion N/A 01/09/2013    Procedure: CARDIOVERSION;  Surgeon: Pricilla RifflePaula V Ross, MD;  [facility];  Service: Cardiovascular;  Laterality: N/A;   Current Outpatient Prescriptions on File Prior to Visit  Medication Sig Dispense Refill  . albuterol  (PROVENTIL HFA;VENTOLIN HFA) 108 (90 BASE) MCG/ACT inhaler Inhale 2 puffs into the lungs every 6 (six) hours as needed for wheezing or shortness of breath.  1 Inhaler  11  . fish oil-omega-3 fatty acids 1000 MG capsule Take 1 g by mouth daily.      Marland Kitchen. KLOR-CON M20 20 MEQ tablet TAKE ONE TABLET BY MOUTH ONCE DAILY *NEEDS OFFICE VISIT BEFORE FURTHER REFILLS*  30 tablet  0   No current facility-administered medications on file prior to visit.   No Known Allergies History   Social History  . Marital Status: Married    Spouse Name: N/A    Number of Children: 2  . Years of Education: N/A   Occupational History  .      Machinist   Social History Main Topics  . Smoking status: Current Some Day Smoker -- 1.50 packs/day    Types: Cigarettes  . Smokeless tobacco: Never Used  . Alcohol Use: No  . Drug Use: No  . Sexual Activity: Not Currently   Other Topics Concern  . Not on file   Social History Narrative  . No narrative on file      Review of Systems  All other systems reviewed and are negative.      Objective:   Physical Exam  Vitals reviewed. Constitutional: He is oriented to person, place, and time. He appears well-developed and well-nourished. No distress.  Neck: Neck supple. No JVD present. No thyromegaly present.  Cardiovascular: Normal rate and regular rhythm.  Exam reveals  no gallop and no friction rub.   Murmur heard. Pulmonary/Chest: Effort normal. He has wheezes. He has no rales. He exhibits no tenderness.  Abdominal: Soft. Bowel sounds are normal. He exhibits no distension and no mass. There is no tenderness. There is no rebound and no guarding.  Musculoskeletal: He exhibits edema.  Lymphadenopathy:    He has no cervical adenopathy.  Neurological: He is alert and oriented to person, place, and time. He has normal reflexes. He displays normal reflexes. No cranial nerve deficit. He exhibits normal muscle tone. Coordination normal.  Skin: Skin is warm. No rash  noted. He is not diaphoretic. No erythema. No pallor.          Assessment & Plan:  1. Type II or unspecified type diabetes mellitus without mention of complication, not stated as uncontrolled Diabetes is fairly controlled. I recommended increasing aerobic exercise to try to lose 5-10 pounds in order to try to drive his hemoglobin M0N less than 6.5. Also recommended a diabetic eye exam. His diabetic foot exam is up-to-date.  Continue aspirin 81 mg by mouth daily.  2. Edema The edema in his legs currently adequately controlled on Bumex. His renal function is stable his potassium is stable.  3. Atrial fibrillation Patient is currently rate controlled in normal sinus rhythm.  Continue metoprolol and diltiazem.  He is appropriately anticoagulated and continue pradaxa for stroke prevention. - metoprolol (LOPRESSOR) 50 MG tablet; Take 1 tablet (50 mg total) by mouth 2 (two) times daily.  Dispense: 60 tablet; Refill: 1 - dabigatran (PRADAXA) 150 MG CAPS capsule; Take 1 capsule (150 mg total) by mouth every 12 (twelve) hours.  Dispense: 60 capsule; Refill: 3  4. HTN (hypertension) Blood pressure is adequately controlled. I am including an ACE inhibitor due to the patient's breathing problems.  Consider an angiotensin receptor blocker in the future  5. Smoking Strongly encouraged smoking cessation.  The patient is at least stage II COPD. The patient declines pulmonary function test today.  The patient is not interested in long-acting bronchodilators. I emphasized that his breathing problems patient as long as he continues to smoke.

## 2014-03-29 ENCOUNTER — Other Ambulatory Visit: Payer: Self-pay

## 2014-03-29 MED ORDER — METOPROLOL TARTRATE 50 MG PO TABS: 50.0000 mg | ORAL_TABLET | Freq: Two times a day (BID) | ORAL | Status: DC

## 2014-03-30 ENCOUNTER — Encounter: Payer: Self-pay | Admitting: Family Medicine

## 2014-03-30 ENCOUNTER — Ambulatory Visit (INDEPENDENT_AMBULATORY_CARE_PROVIDER_SITE_OTHER): Payer: BC Managed Care – PPO | Admitting: Family Medicine

## 2014-03-30 VITALS — BP 132/78 | HR 78 | Temp 98.2°F | Resp 18 | Ht 73.0 in | Wt 311.0 lb

## 2014-03-30 DIAGNOSIS — R609 Edema, unspecified: Secondary | ICD-10-CM

## 2014-03-30 MED ORDER — METOLAZONE 5 MG PO TABS: ORAL_TABLET | ORAL | Status: DC

## 2014-03-30 NOTE — Progress Notes (Signed)
Subjective:    Patient ID: William Alvarado, male    DOB: May 15, 1951, 63 y.o.   MRN: [id number]  HPI Patient reports recalcitrant fluid in both legs. He is currently taking Bumex 2 mg by mouth daily. He is wearing his compression stockings as directed. He denies any chest pain shortness of breath or dyspnea on exertion. Previously we have treated fluid retention with an additional dose of Zaroxolyn in addition to his Bumex. This worked well then. The patient is interested in trying this again. Past Medical History  Diagnosis Date  . Hypertension   . Allergy     allergic rhinitis  . Systolic murmur   . Colon polyps   . Diabetes mellitus without complication   . Degenerative disc disease, lumbar   . Atrial fibrillation    Current Outpatient Prescriptions on File Prior to Visit  Medication Sig Dispense Refill  . albuterol (PROVENTIL HFA;VENTOLIN HFA) 108 (90 BASE) MCG/ACT inhaler Inhale 2 puffs into the lungs every 6 (six) hours as needed for wheezing or shortness of breath.  1 Inhaler  11  . aspirin 81 MG tablet Take 81 mg by mouth daily.      . bumetanide (BUMEX) 1 MG tablet TAKE 2 TABLETS BY MOUTH EVERY DAY  60 tablet  5  . dabigatran (PRADAXA) 150 MG CAPS capsule Take 1 capsule (150 mg total) by mouth every 12 (twelve) hours.  60 capsule  3  . diltiazem (CARDIZEM CD) 240 MG 24 hr capsule TAKE ONE CAPSULE BY MOUTH ONCE DAILY  30 capsule  5  . fish oil-omega-3 fatty acids 1000 MG capsule Take 1 g by mouth daily.      Marland Kitchen. KLOR-CON M20 20 MEQ tablet TAKE ONE TABLET BY MOUTH ONCE DAILY *NEEDS OFFICE VISIT BEFORE FURTHER REFILLS*  30 tablet  0  . metFORMIN (GLUCOPHAGE) 1000 MG tablet TAKE ONE TABLET BY MOUTH TWICE DAILY  60 tablet  5  . metoprolol (LOPRESSOR) 50 MG tablet Take 1 tablet (50 mg total) by mouth 2 (two) times daily.  60 tablet  1  . potassium chloride SA (KLOR-CON M20) 20 MEQ tablet TAKE ONE TABLET BY MOUTH ONCE DAILY  30 tablet  5   No current facility-administered medications  on file prior to visit.   No Known Allergies Past Surgical History  Procedure Laterality Date  . Tee without cardioversion N/A 01/09/2013    Procedure: TRANSESOPHAGEAL ECHOCARDIOGRAM (TEE);  Surgeon: Pricilla RifflePaula V Ross, MD;  [facility];  Service: Cardiovascular;  Laterality: N/A;  . Cardioversion N/A 01/09/2013    Procedure: CARDIOVERSION;  Surgeon: Pricilla RifflePaula V Ross, MD;  [facility];  Service: Cardiovascular;  Laterality: N/A;   History   Social History  . Marital Status: Married    Spouse Name: N/A    Number of Children: 2  . Years of Education: N/A   Occupational History  .      Machinist   Social History Main Topics  . Smoking status: Current Some Day Smoker -- 1.50 packs/day    Types: Cigarettes  . Smokeless tobacco: Never Used  . Alcohol Use: No  . Drug Use: No  . Sexual Activity: Not Currently   Other Topics Concern  . Not on file   Social History Narrative  . No narrative on file      Review of Systems  All other systems reviewed and are negative.      Objective:   Physical Exam  Vitals reviewed. Cardiovascular: Normal rate and regular  rhythm.   Murmur heard. Pulmonary/Chest: Effort normal. He has wheezes. He has no rales.  Abdominal: Soft. Bowel sounds are normal. He exhibits no distension. There is no tenderness. There is no rebound and no guarding.  Musculoskeletal: He exhibits edema.  Skin: There is erythema.  patient has tense +2 pitting edema in both legs with erythema and venous stasis changes on both shins.        Assessment & Plan:  1. Peripheral edema Take Zaroxolyn 5 mg times 1 in the morning and then take Bumex 2 mg 30 minutes later.  Afterwards discontinue Zaroxolyn and continue on a standard dose of Bumex. We could do this PRN if successful.  Recheck Monday. - metolazone (ZAROXOLYN) 5 MG tablet; Take 1 pill and wait 30 minutes and then take bumex.  DO NOT DO THIS EVERYDAY  Dispense: 10 tablet; Refill: 0

## 2014-05-04 ENCOUNTER — Encounter: Payer: Self-pay | Admitting: Family Medicine

## 2014-05-04 ENCOUNTER — Ambulatory Visit (INDEPENDENT_AMBULATORY_CARE_PROVIDER_SITE_OTHER): Payer: BC Managed Care – PPO | Admitting: Family Medicine

## 2014-05-04 VITALS — BP 122/78 | HR 68 | Temp 98.6°F | Resp 14 | Ht 69.69 in | Wt 304.0 lb

## 2014-05-04 DIAGNOSIS — I480 Paroxysmal atrial fibrillation: Secondary | ICD-10-CM

## 2014-05-04 DIAGNOSIS — R609 Edema, unspecified: Secondary | ICD-10-CM

## 2014-05-04 DIAGNOSIS — I4891 Unspecified atrial fibrillation: Secondary | ICD-10-CM

## 2014-05-04 MED ORDER — WARFARIN SODIUM 5 MG PO TABS: 5.0000 mg | ORAL_TABLET | Freq: Every day | ORAL | Status: DC

## 2014-05-04 NOTE — Progress Notes (Signed)
Subjective:    Patient ID: William Alvarado, male    DOB: [birth year], 63 y.o.   MRN: [id number]  HPI 03/30/14 Patient reports recalcitrant fluid in both legs. He is currently taking Bumex 2 mg by mouth daily. He is wearing his compression stockings as directed. He denies any chest pain shortness of breath or dyspnea on exertion. Previously we have treated fluid retention with an additional dose of Zaroxolyn in addition to his Bumex. This worked well then. The patient is interested in trying this again.  At that time, my plan was: 1. Peripheral edema Take Zaroxolyn 5 mg times 1 in the morning and then take Bumex 2 mg 30 minutes later.  Afterwards discontinue Zaroxolyn and continue on a standard dose of Bumex. We could do this PRN if successful.  Recheck Monday. - metolazone (ZAROXOLYN) 5 MG tablet; Take 1 pill and wait 30 minutes and then take bumex.  DO NOT DO THIS EVERYDAY  Dispense: 10 tablet; Refill: 0  05/04/14 I have not heard from the patient after the last ov.  He was able to lose approximately 15 pounds after the last office visit. However the edema has steadily increased. He now has +2 pitting edema in both legs.  He has stable chronic shortness of breath and dyspnea on exertion. He denies any chest pain or chest pressure. He continues to smoke. He has COPD. Past Medical History  Diagnosis Date  . Hypertension   . Allergy     allergic rhinitis  . Systolic murmur   . Colon polyps   . Diabetes mellitus without complication   . Degenerative disc disease, lumbar   . Atrial fibrillation    Current Outpatient Prescriptions on File Prior to Visit  Medication Sig Dispense Refill  . albuterol (PROVENTIL HFA;VENTOLIN HFA) 108 (90 BASE) MCG/ACT inhaler Inhale 2 puffs into the lungs every 6 (six) hours as needed for wheezing or shortness of breath.  1 Inhaler  11  . aspirin 81 MG tablet Take 81 mg by mouth daily.      . bumetanide (BUMEX) 1 MG tablet TAKE 2 TABLETS BY MOUTH EVERY DAY  60  tablet  5  . dabigatran (PRADAXA) 150 MG CAPS capsule Take 1 capsule (150 mg total) by mouth every 12 (twelve) hours.  60 capsule  3  . diltiazem (CARDIZEM CD) 240 MG 24 hr capsule TAKE ONE CAPSULE BY MOUTH ONCE DAILY  30 capsule  5  . fish oil-omega-3 fatty acids 1000 MG capsule Take 1 g by mouth daily.      Marland Kitchen KLOR-CON M20 20 MEQ tablet TAKE ONE TABLET BY MOUTH ONCE DAILY *NEEDS OFFICE VISIT BEFORE FURTHER REFILLS*  30 tablet  0  . metFORMIN (GLUCOPHAGE) 1000 MG tablet TAKE ONE TABLET BY MOUTH TWICE DAILY  60 tablet  5  . metolazone (ZAROXOLYN) 5 MG tablet Take 1 pill and wait 30 minutes and then take bumex.  DO NOT DO THIS EVERYDAY  10 tablet  0  . metoprolol (LOPRESSOR) 50 MG tablet Take 1 tablet (50 mg total) by mouth 2 (two) times daily.  60 tablet  1  . potassium chloride SA (KLOR-CON M20) 20 MEQ tablet TAKE ONE TABLET BY MOUTH ONCE DAILY  30 tablet  5   No current facility-administered medications on file prior to visit.   No Known Allergies Past Surgical History  Procedure Laterality Date  . Tee without cardioversion N/A 01/09/2013    Procedure: TRANSESOPHAGEAL ECHOCARDIOGRAM (TEE);  Surgeon: Pricilla Riffle, MD;  Location: [facility] ENDOSCOPY;  Service: Cardiovascular;  Laterality: N/A;  . Cardioversion N/A 01/09/2013    Procedure: CARDIOVERSION;  Surgeon: Pricilla RifflePaula V Ross, MD;  [facility];  Service: Cardiovascular;  Laterality: N/A;   History   Social History  . Marital Status: Married    Spouse Name: N/A    Number of Children: 2  . Years of Education: N/A   Occupational History  .      Machinist   Social History Main Topics  . Smoking status: Current Some Day Smoker -- 1.50 packs/day    Types: Cigarettes  . Smokeless tobacco: Never Used  . Alcohol Use: No  . Drug Use: No  . Sexual Activity: Not Currently   Other Topics Concern  . Not on file   Social History Narrative  . No narrative on file      Review of Systems  All other systems reviewed and are  negative.      Objective:   Physical Exam  Vitals reviewed. Cardiovascular: Normal rate and regular rhythm.   Murmur heard. Pulmonary/Chest: Effort normal. He has wheezes. He has no rales.  Abdominal: Soft. Bowel sounds are normal. He exhibits no distension. There is no tenderness. There is no rebound and no guarding.  Musculoskeletal: He exhibits edema.  Skin: There is erythema.  patient has tense +2 pitting edema in both legs with erythema and venous stasis changes on both shins. Circumference of his left calf is 48 cm. Circumference of his right calf is 43 cm.       Assessment & Plan:   Paroxysmal atrial fibrillation - Plan: warfarin (COUMADIN) 5 MG tablet  Patient is not currently taking aspirin with Robaxin. Therefore I will start the patient on warfarin 5 mg by mouth daily and recheck in 9 are in one week. The patient does have peripheral edema. He is uncontrolled on Bumex 2 mg by mouth daily. Increase bumex to 2 mg by mouth every morning and 1 mg by mouth every afternoon and recheck a BNP and swelling one week. I believe the swelling is due to right-sided heart failure due to a combination of morbid obesity, COPD and smoking, and likely sleep apnea. I recommended smoking cessation. I recommended weight loss. Also requested to perform a sleep study but the patient refused. Recheck in [redacted] week along with INR.

## 2014-05-11 ENCOUNTER — Ambulatory Visit (INDEPENDENT_AMBULATORY_CARE_PROVIDER_SITE_OTHER): Payer: BC Managed Care – PPO | Admitting: Family Medicine

## 2014-05-11 ENCOUNTER — Encounter: Payer: Self-pay | Admitting: Family Medicine

## 2014-05-11 VITALS — BP 110/76 | HR 76 | Temp 98.4°F | Resp 18 | Ht 73.0 in | Wt 300.0 lb

## 2014-05-11 DIAGNOSIS — I4891 Unspecified atrial fibrillation: Secondary | ICD-10-CM

## 2014-05-11 DIAGNOSIS — I480 Paroxysmal atrial fibrillation: Secondary | ICD-10-CM

## 2014-05-11 LAB — PT WITH INR/FINGERSTICK
INR FINGERSTICK: 1.3 — AB (ref 0.80–1.20)
PT FINGERSTICK: 15.5 s — AB (ref 10.4–12.5)

## 2014-05-11 LAB — BASIC METABOLIC PANEL WITH GFR
BUN: 14 mg/dL (ref 6–23)
CALCIUM: 9.1 mg/dL (ref 8.4–10.5)
CO2: 28 mEq/L (ref 19–32)
CREATININE: 0.94 mg/dL (ref 0.50–1.35)
Chloride: 98 mEq/L (ref 96–112)
GFR, EST NON AFRICAN AMERICAN: 88 mL/min
Glucose, Bld: 120 mg/dL — ABNORMAL HIGH (ref 70–99)
Potassium: 4.8 mEq/L (ref 3.5–5.3)
Sodium: 136 mEq/L (ref 135–145)

## 2014-05-11 NOTE — Progress Notes (Signed)
Subjective:    Patient ID: William Alvarado, male    DOB: May 24, 1951, 63 y.o.   MRN: [id number]  HPI 03/30/14 Patient reports recalcitrant fluid in both legs. He is currently taking Bumex 2 mg by mouth daily. He is wearing his compression stockings as directed. He denies any chest pain shortness of breath or dyspnea on exertion. Previously we have treated fluid retention with an additional dose of Zaroxolyn in addition to his Bumex. This worked well then. The patient is interested in trying this again.  At that time, my plan was: 1. Peripheral edema Take Zaroxolyn 5 mg times 1 in the morning and then take Bumex 2 mg 30 minutes later.  Afterwards discontinue Zaroxolyn and continue on a standard dose of Bumex. We could do this PRN if successful.  Recheck Monday. - metolazone (ZAROXOLYN) 5 MG tablet; Take 1 pill and wait 30 minutes and then take bumex.  DO NOT DO THIS EVERYDAY  Dispense: 10 tablet; Refill: 0  05/04/14 I have not heard from the patient after the last ov.  He was able to lose approximately 15 pounds after the last office visit. However the edema has steadily increased. He now has +2 pitting edema in both legs.  He has stable chronic shortness of breath and dyspnea on exertion. He denies any chest pain or chest pressure. He continues to smoke. He has COPD.  At that time, my plan was: Patient is not currently taking aspirin or pradaxa. Therefore I will start the patient on warfarin 5 mg by mouth daily and recheck in 9 are in one week. The patient does have peripheral edema. He is uncontrolled on Bumex 2 mg by mouth daily. Increase bumex to 2 mg by mouth every morning and 1 mg by mouth every afternoon and recheck a BNP and swelling one week. I believe the swelling is due to right-sided heart failure due to a combination of morbid obesity, COPD and smoking, and likely sleep apnea. I recommended smoking cessation. I recommended weight loss. Also requested to perform a sleep study but the patient  refused. Recheck in [redacted] week along with INR.  05/11/14 The swelling in the patient's legs is noticeably better. They're not intensely silent. He has +1 edema today. His weight is down 4 pounds. Overall he feels better. He is due today to recheck his potassium on the higher dose of bumex. His INR today is subtherapeutic at 1.3 Past Medical History  Diagnosis Date  . Hypertension   . Allergy     allergic rhinitis  . Systolic murmur   . Colon polyps   . Diabetes mellitus without complication   . Degenerative disc disease, lumbar   . Atrial fibrillation    Current Outpatient Prescriptions on File Prior to Visit  Medication Sig Dispense Refill  . albuterol (PROVENTIL HFA;VENTOLIN HFA) 108 (90 BASE) MCG/ACT inhaler Inhale 2 puffs into the lungs every 6 (six) hours as needed for wheezing or shortness of breath.  1 Inhaler  11  . aspirin 81 MG tablet Take 81 mg by mouth daily.      . bumetanide (BUMEX) 1 MG tablet TAKE 2 TABLETS BY MOUTH EVERY DAY  60 tablet  5  . dabigatran (PRADAXA) 150 MG CAPS capsule Take 1 capsule (150 mg total) by mouth every 12 (twelve) hours.  60 capsule  3  . diltiazem (CARDIZEM CD) 240 MG 24 hr capsule TAKE ONE CAPSULE BY MOUTH ONCE DAILY  30 capsule  5  . fish oil-omega-3  fatty acids 1000 MG capsule Take 1 g by mouth daily.      Marland Kitchen. KLOR-CON M20 20 MEQ tablet TAKE ONE TABLET BY MOUTH ONCE DAILY *NEEDS OFFICE VISIT BEFORE FURTHER REFILLS*  30 tablet  0  . metFORMIN (GLUCOPHAGE) 1000 MG tablet TAKE ONE TABLET BY MOUTH TWICE DAILY  60 tablet  5  . metolazone (ZAROXOLYN) 5 MG tablet Take 1 pill and wait 30 minutes and then take bumex.  DO NOT DO THIS EVERYDAY  10 tablet  0  . metoprolol (LOPRESSOR) 50 MG tablet Take 1 tablet (50 mg total) by mouth 2 (two) times daily.  60 tablet  1  . warfarin (COUMADIN) 5 MG tablet Take 1 tablet (5 mg total) by mouth daily.  30 tablet  3   No current facility-administered medications on file prior to visit.   No Known Allergies Past  Surgical History  Procedure Laterality Date  . Tee without cardioversion N/A 01/09/2013    Procedure: TRANSESOPHAGEAL ECHOCARDIOGRAM (TEE);  Surgeon: Pricilla RifflePaula V Ross, MD;  [facility];  Service: Cardiovascular;  Laterality: N/A;  . Cardioversion N/A 01/09/2013    Procedure: CARDIOVERSION;  Surgeon: Pricilla RifflePaula V Ross, MD;  [facility];  Service: Cardiovascular;  Laterality: N/A;   History   Social History  . Marital Status: Married    Spouse Name: N/A    Number of Children: 2  . Years of Education: N/A   Occupational History  .      Machinist   Social History Main Topics  . Smoking status: Current Some Day Smoker -- 1.50 packs/day    Types: Cigarettes  . Smokeless tobacco: Never Used  . Alcohol Use: No  . Drug Use: No  . Sexual Activity: Not Currently   Other Topics Concern  . Not on file   Social History Narrative  . No narrative on file      Review of Systems  All other systems reviewed and are negative.      Objective:   Physical Exam  Vitals reviewed. Cardiovascular: Normal rate and regular rhythm.   Murmur heard. Pulmonary/Chest: Effort normal. He has wheezes. He has no rales.  Abdominal: Soft. Bowel sounds are normal. He exhibits no distension. There is no tenderness. There is no rebound and no guarding.  Musculoskeletal: He exhibits edema.  Skin: There is erythema.         Assessment & Plan:   Paroxysmal atrial fibrillation - Plan: PT with INR/Fingerstick, BASIC METABOLIC PANEL WITH GFR  Increase Coumadin to 10 mg on Monday, Wednesday, Friday, and Sunday. Take 5 mg on Tuesday, Thursday, Saturday. Recheck INR in one week. Continue bumex 2 mg in the morning and 1 mg in the evening. I will check a BMP today to evaluate the patient's K.

## 2014-05-18 ENCOUNTER — Other Ambulatory Visit: Payer: BC Managed Care – PPO

## 2014-05-18 DIAGNOSIS — I480 Paroxysmal atrial fibrillation: Secondary | ICD-10-CM

## 2014-05-18 LAB — PT WITH INR/FINGERSTICK
INR, fingerstick: 2.4 — ABNORMAL HIGH (ref 0.80–1.20)
PT, fingerstick: 28.8 seconds — ABNORMAL HIGH (ref 10.4–12.5)

## 2014-05-29 ENCOUNTER — Telehealth: Payer: Self-pay | Admitting: Family Medicine

## 2014-05-29 MED ORDER — BUMETANIDE 1 MG PO TABS: ORAL_TABLET | ORAL | Status: DC

## 2014-05-29 NOTE — Telephone Encounter (Signed)
Patients wife calling to talk with you about dosage of his bomeanide please call debbie back at [phone]

## 2014-05-29 NOTE — Telephone Encounter (Signed)
Med sent to pharm with new sig instructions per pt's wife request

## 2014-05-31 ENCOUNTER — Telehealth: Payer: Self-pay | Admitting: *Deleted

## 2014-05-31 ENCOUNTER — Other Ambulatory Visit: Payer: Self-pay | Admitting: *Deleted

## 2014-05-31 MED ORDER — METOPROLOL TARTRATE 50 MG PO TABS: 50.0000 mg | ORAL_TABLET | Freq: Two times a day (BID) | ORAL | Status: DC

## 2014-05-31 NOTE — Telephone Encounter (Signed)
Pt wife called stating needing refill on metoprolol pt last ov 08/15 script was refilled and pt wife aware.

## 2014-06-08 ENCOUNTER — Encounter: Payer: Self-pay | Admitting: Family Medicine

## 2014-06-08 ENCOUNTER — Ambulatory Visit (INDEPENDENT_AMBULATORY_CARE_PROVIDER_SITE_OTHER): Payer: BC Managed Care – PPO | Admitting: Family Medicine

## 2014-06-08 VITALS — BP 130/68 | HR 66 | Temp 98.4°F | Resp 20 | Ht 73.0 in | Wt 301.0 lb

## 2014-06-08 DIAGNOSIS — Z23 Encounter for immunization: Secondary | ICD-10-CM

## 2014-06-08 DIAGNOSIS — I4891 Unspecified atrial fibrillation: Secondary | ICD-10-CM

## 2014-06-08 DIAGNOSIS — I480 Paroxysmal atrial fibrillation: Secondary | ICD-10-CM

## 2014-06-08 LAB — PT WITH INR/FINGERSTICK
INR FINGERSTICK: 2.3 — AB (ref 0.80–1.20)
PT FINGERSTICK: 27 s — AB (ref 10.4–12.5)

## 2014-06-08 MED ORDER — WARFARIN SODIUM 5 MG PO TABS: ORAL_TABLET | ORAL | Status: DC

## 2014-06-08 NOTE — Addendum Note (Signed)
Addended by: Legrand Rams B on: 06/08/2014 09:17 AM   Modules accepted: Orders

## 2014-06-08 NOTE — Progress Notes (Signed)
Subjective:    Patient ID: William Alvarado, male    DOB: April 25, 1951, 63 y.o.   MRN: [id number]  HPI 03/30/14 Patient reports recalcitrant fluid in both legs. He is currently taking Bumex 2 mg by mouth daily. He is wearing his compression stockings as directed. He denies any chest pain shortness of breath or dyspnea on exertion. Previously we have treated fluid retention with an additional dose of Zaroxolyn in addition to his Bumex. This worked well then. The patient is interested in trying this again.  At that time, my plan was: 1. Peripheral edema Take Zaroxolyn 5 mg times 1 in the morning and then take Bumex 2 mg 30 minutes later.  Afterwards discontinue Zaroxolyn and continue on a standard dose of Bumex. We could do this PRN if successful.  Recheck Monday. - metolazone (ZAROXOLYN) 5 MG tablet; Take 1 pill and wait 30 minutes and then take bumex.  DO NOT DO THIS EVERYDAY  Dispense: 10 tablet; Refill: 0  05/04/14 I have not heard from the patient after the last ov.  He was able to lose approximately 15 pounds after the last office visit. However the edema has steadily increased. He now has +2 pitting edema in both legs.  He has stable chronic shortness of breath and dyspnea on exertion. He denies any chest pain or chest pressure. He continues to smoke. He has COPD.  At that time, my plan was: Patient is not currently taking aspirin or pradaxa. Therefore I will start the patient on warfarin 5 mg by mouth daily and recheck in 9 are in one week. The patient does have peripheral edema. He is uncontrolled on Bumex 2 mg by mouth daily. Increase bumex to 2 mg by mouth every morning and 1 mg by mouth every afternoon and recheck a BNP and swelling one week. I believe the swelling is due to right-sided heart failure due to a combination of morbid obesity, COPD and smoking, and likely sleep apnea. I recommended smoking cessation. I recommended weight loss. Also requested to perform a sleep study but the patient  refused. Recheck in [redacted] week along with INR.  05/11/14 The swelling in the patient's legs is noticeably better. They're not intensely silent. He has +1 edema today. His weight is down 4 pounds. Overall he feels better. He is due today to recheck his potassium on the higher dose of bumex. His INR today is subtherapeutic at 1.3.  At that time, my plan was: Atrial fibrillation, unspecified - Plan: PT with INR/Fingerstick, PT with INR/Fingerstick  Increase Coumadin to 10 mg on Monday, Wednesday, Friday, and Sunday. Take 5 mg on Tuesday, Thursday, Saturday. Recheck INR in one week. Continue bumex 2 mg in the morning and 1 mg in the evening. I will check a BMP today to evaluate the patient's K.  06/08/14 He is here today for recheck of his INR.  Taking 10 mg of Coumadin on Monday Wednesday Friday and Sunday. He is taking 5 mg on Tuesday Thursday and Saturday.  INR is therapeutic at 2.3.  Patient denies any bleeding or bruising. The edema in his legs is much better. He has trace bipedal edema. Past Medical History  Diagnosis Date  . Hypertension   . Allergy     allergic rhinitis  . Systolic murmur   . Colon polyps   . Diabetes mellitus without complication   . Degenerative disc disease, lumbar   . Atrial fibrillation    Current Outpatient Prescriptions on File Prior to Visit  Medication Sig Dispense Refill  . albuterol (PROVENTIL HFA;VENTOLIN HFA) 108 (90 BASE) MCG/ACT inhaler Inhale 2 puffs into the lungs every 6 (six) hours as needed for wheezing or shortness of breath.  1 Inhaler  11  . aspirin 81 MG tablet Take 81 mg by mouth daily.      . bumetanide (BUMEX) 1 MG tablet 2 mg in am, 1 mg in pm  90 tablet  5  . diltiazem (CARDIZEM CD) 240 MG 24 hr capsule TAKE ONE CAPSULE BY MOUTH ONCE DAILY  30 capsule  5  . fish oil-omega-3 fatty acids 1000 MG capsule Take 1 g by mouth daily.      Marland Kitchen KLOR-CON M20 20 MEQ tablet TAKE ONE TABLET BY MOUTH ONCE DAILY *NEEDS OFFICE VISIT BEFORE FURTHER REFILLS*  30  tablet  0  . metFORMIN (GLUCOPHAGE) 1000 MG tablet TAKE ONE TABLET BY MOUTH TWICE DAILY  60 tablet  5  . metolazone (ZAROXOLYN) 5 MG tablet Take 1 pill and wait 30 minutes and then take bumex.  DO NOT DO THIS EVERYDAY  10 tablet  0  . metoprolol (LOPRESSOR) 50 MG tablet Take 1 tablet (50 mg total) by mouth 2 (two) times daily.  60 tablet  1  . warfarin (COUMADIN) 5 MG tablet Take 1 tablet (5 mg total) by mouth daily.  30 tablet  3   No current facility-administered medications on file prior to visit.   No Known Allergies Past Surgical History  Procedure Laterality Date  . Tee without cardioversion N/A 01/09/2013    Procedure: TRANSESOPHAGEAL ECHOCARDIOGRAM (TEE);  Surgeon: Pricilla Riffle, MD;  Location: [facility] ENDOSCOPY;  Service: Cardiovascular;  Laterality: N/A;  . Cardioversion N/A 01/09/2013    Procedure: CARDIOVERSION;  Surgeon: Pricilla Riffle, MD;  Location: [facility] ENDOSCOPY;  Service: Cardiovascular;  Laterality: N/A;   History   Social History  . Marital Status: Married    Spouse Name: N/A    Number of Children: 2  . Years of Education: N/A   Occupational History  .      Machinist   Social History Main Topics  . Smoking status: Current Some Day Smoker -- 1.50 packs/day    Types: Cigarettes  . Smokeless tobacco: Never Used  . Alcohol Use: No  . Drug Use: No  . Sexual Activity: Not Currently   Other Topics Concern  . Not on file   Social History Narrative  . No narrative on file      Review of Systems  All other systems reviewed and are negative.      Objective:   Physical Exam  Vitals reviewed. Cardiovascular: Normal rate and regular rhythm.   Murmur heard. Pulmonary/Chest: Effort normal. He has wheezes. He has no rales.  Abdominal: Soft. Bowel sounds are normal. He exhibits no distension. There is no tenderness. There is no rebound and no guarding.  Skin: No erythema.         Assessment & Plan:   Atrial fibrillation, unspecified - Plan: PT with  INR/Fingerstick, PT with INR/Fingerstick  Paroxysmal atrial fibrillation - Plan: warfarin (COUMADIN) 5 MG tablet   Coumadin is dosed appropriately. Continue at its current dose. I also give patient flu shot.  I encouraged the patient to quit smoking yet again.

## 2014-07-13 ENCOUNTER — Encounter: Payer: Self-pay | Admitting: Family Medicine

## 2014-07-13 ENCOUNTER — Ambulatory Visit (INDEPENDENT_AMBULATORY_CARE_PROVIDER_SITE_OTHER): Payer: BC Managed Care – PPO | Admitting: Family Medicine

## 2014-07-13 VITALS — BP 126/82 | HR 86 | Temp 98.3°F | Resp 20 | Ht 73.0 in | Wt 304.0 lb

## 2014-07-13 DIAGNOSIS — I4891 Unspecified atrial fibrillation: Secondary | ICD-10-CM

## 2014-07-13 DIAGNOSIS — E119 Type 2 diabetes mellitus without complications: Secondary | ICD-10-CM

## 2014-07-13 LAB — COMPLETE METABOLIC PANEL WITH GFR
ALBUMIN: 4 g/dL (ref 3.5–5.2)
ALK PHOS: 76 U/L (ref 39–117)
ALT: 19 U/L (ref 0–53)
AST: 18 U/L (ref 0–37)
BILIRUBIN TOTAL: 0.8 mg/dL (ref 0.2–1.2)
BUN: 12 mg/dL (ref 6–23)
CO2: 27 meq/L (ref 19–32)
Calcium: 8.9 mg/dL (ref 8.4–10.5)
Chloride: 99 mEq/L (ref 96–112)
Creat: 0.89 mg/dL (ref 0.50–1.35)
GFR, Est Non African American: >89 mL/min
Glucose, Bld: 122 mg/dL — ABNORMAL HIGH (ref 70–99)
POTASSIUM: 4.5 meq/L (ref 3.5–5.3)
SODIUM: 137 meq/L (ref 135–145)
TOTAL PROTEIN: 6.8 g/dL (ref 6.0–8.3)

## 2014-07-13 LAB — LIPID PANEL
Cholesterol: 131 mg/dL (ref 0–200)
HDL: 29 mg/dL — ABNORMAL LOW (ref >39)
LDL CALC: 81 mg/dL (ref 0–99)
Total CHOL/HDL Ratio: 4.5 Ratio
Triglycerides: 103 mg/dL (ref <150)
VLDL: 21 mg/dL (ref 0–40)

## 2014-07-13 LAB — HEMOGLOBIN A1C
HEMOGLOBIN A1C: 7.1 % — AB (ref <5.7)
Mean Plasma Glucose: 157 mg/dL — ABNORMAL HIGH (ref <117)

## 2014-07-13 LAB — PT WITH INR/FINGERSTICK
INR, fingerstick: 2.1 — ABNORMAL HIGH (ref 0.80–1.20)
PT, fingerstick: 25 seconds — ABNORMAL HIGH (ref 10.4–12.5)

## 2014-07-13 LAB — MICROALBUMIN, URINE: Microalb, Ur: 68.9 mg/dL — ABNORMAL HIGH (ref <2.0)

## 2014-07-13 NOTE — Progress Notes (Signed)
Subjective:    Patient ID: William Alvarado, male    DOB: Mar 27, 1951, 63 y.o.   MRN: [id number]  HPI 03/30/14 Patient reports recalcitrant fluid in both legs. He is currently taking Bumex 2 mg by mouth daily. He is wearing his compression stockings as directed. He denies any chest pain shortness of breath or dyspnea on exertion. Previously we have treated fluid retention with an additional dose of Zaroxolyn in addition to his Bumex. This worked well then. The patient is interested in trying this again.  At that time, my plan was: 1. Peripheral edema Take Zaroxolyn 5 mg times 1 in the morning and then take Bumex 2 mg 30 minutes later.  Afterwards discontinue Zaroxolyn and continue on a standard dose of Bumex. We could do this PRN if successful.  Recheck Monday. - metolazone (ZAROXOLYN) 5 MG tablet; Take 1 pill and wait 30 minutes and then take bumex.  DO NOT DO THIS EVERYDAY  Dispense: 10 tablet; Refill: 0  05/04/14 I have not heard from the patient after the last ov.  He was able to lose approximately 15 pounds after the last office visit. However the edema has steadily increased. He now has +2 pitting edema in both legs.  He has stable chronic shortness of breath and dyspnea on exertion. He denies any chest pain or chest pressure. He continues to smoke. He has COPD.  At that time, my plan was: Patient is not currently taking aspirin or pradaxa. Therefore I will start the patient on warfarin 5 mg by mouth daily and recheck in 9 are in one week. The patient does have peripheral edema. He is uncontrolled on Bumex 2 mg by mouth daily. Increase bumex to 2 mg by mouth every morning and 1 mg by mouth every afternoon and recheck a BNP and swelling one week. I believe the swelling is due to right-sided heart failure due to a combination of morbid obesity, COPD and smoking, and likely sleep apnea. I recommended smoking cessation. I recommended weight loss. Also requested to perform a sleep study but the patient  refused. Recheck in [redacted] week along with INR.  05/11/14 The swelling in the patient's legs is noticeably better. They're not intensely silent. He has +1 edema today. His weight is down 4 pounds. Overall he feels better. He is due today to recheck his potassium on the higher dose of bumex. His INR today is subtherapeutic at 1.3.  At that time, my plan was: Atrial fibrillation, unspecified - Plan: PT with INR/Fingerstick, PT with INR/Fingerstick  Increase Coumadin to 10 mg on Monday, Wednesday, Friday, and Sunday. Take 5 mg on Tuesday, Thursday, Saturday. Recheck INR in one week. Continue bumex 2 mg in the morning and 1 mg in the evening. I will check a BMP today to evaluate the patient's K.  06/08/14 He is here today for recheck of his INR.  Taking 10 mg of Coumadin on Monday Wednesday Friday and Sunday. He is taking 5 mg on Tuesday Thursday and Saturday.  INR is therapeutic at 2.3.  Patient denies any bleeding or bruising. The edema in his legs is much better. He has trace bipedal edema.  At that time, my plan was: Atrial fibrillation, unspecified - Plan: PT with INR/Fingerstick Coumadin is dosed appropriately. Continue at its current dose. I also give patient flu shot.  I encouraged the patient to quit smoking yet again.  07/13/14 Here today for follow up.  INR is 2.1.  Currently on 10 mg of coumadin  M/W/F/Sun and 5 mg Tues/Thurs, Sat.  he denies any bleeding or bruising. He has not missed any doses of Coumadin. There been no changes in his medication. He is also due for recheck of his hemoglobin A1c and fasting lipid panel today for his diabetes. Past Medical History  Diagnosis Date  . Hypertension   . Allergy     allergic rhinitis  . Systolic murmur   . Colon polyps   . Diabetes mellitus without complication   . Degenerative disc disease, lumbar   . Atrial fibrillation    Current Outpatient Prescriptions on File Prior to Visit  Medication Sig Dispense Refill  . albuterol (PROVENTIL  HFA;VENTOLIN HFA) 108 (90 BASE) MCG/ACT inhaler Inhale 2 puffs into the lungs every 6 (six) hours as needed for wheezing or shortness of breath.  1 Inhaler  11  . aspirin 81 MG tablet Take 81 mg by mouth daily.      . bumetanide (BUMEX) 1 MG tablet 2 mg in am, 1 mg in pm  90 tablet  5  . diltiazem (CARDIZEM CD) 240 MG 24 hr capsule TAKE ONE CAPSULE BY MOUTH ONCE DAILY  30 capsule  5  . fish oil-omega-3 fatty acids 1000 MG capsule Take 1 g by mouth daily.      Marland Kitchen. KLOR-CON M20 20 MEQ tablet TAKE ONE TABLET BY MOUTH ONCE DAILY *NEEDS OFFICE VISIT BEFORE FURTHER REFILLS*  30 tablet  0  . metFORMIN (GLUCOPHAGE) 1000 MG tablet TAKE ONE TABLET BY MOUTH TWICE DAILY  60 tablet  5  . metolazone (ZAROXOLYN) 5 MG tablet Take 1 pill and wait 30 minutes and then take bumex.  DO NOT DO THIS EVERYDAY  10 tablet  0  . metoprolol (LOPRESSOR) 50 MG tablet Take 1 tablet (50 mg total) by mouth 2 (two) times daily.  60 tablet  1  . warfarin (COUMADIN) 5 MG tablet Take as directed  90 tablet  3   No current facility-administered medications on file prior to visit.   No Known Allergies Past Surgical History  Procedure Laterality Date  . Tee without cardioversion N/A 01/09/2013    Procedure: TRANSESOPHAGEAL ECHOCARDIOGRAM (TEE);  Surgeon: Pricilla RifflePaula V Ross, MD;  [facility];  Service: Cardiovascular;  Laterality: N/A;  . Cardioversion N/A 01/09/2013    Procedure: CARDIOVERSION;  Surgeon: Pricilla RifflePaula V Ross, MD;  [facility];  Service: Cardiovascular;  Laterality: N/A;   History   Social History  . Marital Status: Married    Spouse Name: N/A    Number of Children: 2  . Years of Education: N/A   Occupational History  .      Machinist   Social History Main Topics  . Smoking status: Current Some Day Smoker -- 1.50 packs/day    Types: Cigarettes  . Smokeless tobacco: Never Used  . Alcohol Use: No  . Drug Use: No  . Sexual Activity: Not Currently   Other Topics Concern  . Not on file   Social  History Narrative  . No narrative on file      Review of Systems  All other systems reviewed and are negative.      Objective:   Physical Exam  Vitals reviewed. Cardiovascular: Normal rate and regular rhythm.   Murmur heard. Pulmonary/Chest: Effort normal. He has wheezes. He has no rales.  Abdominal: Soft. Bowel sounds are normal. He exhibits no distension. There is no tenderness. There is no rebound and no guarding.  Skin: No erythema.  Assessment & Plan:   Atrial fibrillation, unspecified - Plan: PT with INR/Fingerstick  Diabetes mellitus type II, non insulin dependent - Plan: COMPLETE METABOLIC PANEL WITH GFR, Hemoglobin A1c, Lipid panel, Microalbumin, urine  Coumadin is appropriately dosed. No changes in his Coumadin were recommended. Recheck Coumadin in 6 weeks. I will also check a fasting lipid panel and hemoglobin A1c. Blood pressure is excellent. I continue to encourage the patient quit smoking.

## 2014-07-18 ENCOUNTER — Other Ambulatory Visit: Payer: Self-pay | Admitting: Family Medicine

## 2014-07-18 MED ORDER — LOSARTAN POTASSIUM 50 MG PO TABS: 50.0000 mg | ORAL_TABLET | Freq: Every day | ORAL | Status: DC

## 2014-07-26 ENCOUNTER — Other Ambulatory Visit: Payer: Self-pay | Admitting: Family Medicine

## 2014-07-27 ENCOUNTER — Other Ambulatory Visit: Payer: Self-pay | Admitting: Family Medicine

## 2014-08-13 ENCOUNTER — Other Ambulatory Visit: Payer: Self-pay | Admitting: Family Medicine

## 2014-08-24 ENCOUNTER — Ambulatory Visit: Payer: BC Managed Care – PPO | Admitting: Family Medicine

## 2014-08-28 ENCOUNTER — Ambulatory Visit: Payer: BC Managed Care – PPO | Admitting: Family Medicine

## 2014-09-08 ENCOUNTER — Other Ambulatory Visit: Payer: Self-pay | Admitting: Family Medicine

## 2014-09-12 ENCOUNTER — Other Ambulatory Visit: Payer: Self-pay | Admitting: Family Medicine

## 2014-09-16 NOTE — Telephone Encounter (Signed)
Refill appropriate and filled per protocol. 

## 2014-09-29 ENCOUNTER — Other Ambulatory Visit: Payer: Self-pay | Admitting: Family Medicine

## 2014-10-05 ENCOUNTER — Ambulatory Visit (INDEPENDENT_AMBULATORY_CARE_PROVIDER_SITE_OTHER): Payer: BLUE CROSS/BLUE SHIELD | Admitting: Family Medicine

## 2014-10-05 ENCOUNTER — Encounter: Payer: Self-pay | Admitting: Family Medicine

## 2014-10-05 VITALS — BP 120/84 | HR 96 | Temp 98.3°F | Resp 20 | Wt 307.0 lb

## 2014-10-05 DIAGNOSIS — I482 Chronic atrial fibrillation, unspecified: Secondary | ICD-10-CM

## 2014-10-05 DIAGNOSIS — Z7901 Long term (current) use of anticoagulants: Secondary | ICD-10-CM

## 2014-10-05 LAB — PT WITH INR/FINGERSTICK
INR, fingerstick: 1.6 — ABNORMAL HIGH (ref 0.80–1.20)
PT, fingerstick: 19.4 seconds — ABNORMAL HIGH (ref 10.4–12.5)

## 2014-10-05 NOTE — Progress Notes (Signed)
Subjective:    Patient ID: William Alvarado, male    DOB: Mar 27, 1951, 64 y.o.   MRN: [id number]  HPI 03/30/14 Patient reports recalcitrant fluid in both legs. He is currently taking Bumex 2 mg by mouth daily. He is wearing his compression stockings as directed. He denies any chest pain shortness of breath or dyspnea on exertion. Previously we have treated fluid retention with an additional dose of Zaroxolyn in addition to his Bumex. This worked well then. The patient is interested in trying this again.  At that time, my plan was: 1. Peripheral edema Take Zaroxolyn 5 mg times 1 in the morning and then take Bumex 2 mg 30 minutes later.  Afterwards discontinue Zaroxolyn and continue on a standard dose of Bumex. We could do this PRN if successful.  Recheck Monday. - metolazone (ZAROXOLYN) 5 MG tablet; Take 1 pill and wait 30 minutes and then take bumex.  DO NOT DO THIS EVERYDAY  Dispense: 10 tablet; Refill: 0  05/04/14 I have not heard from the patient after the last ov.  He was able to lose approximately 15 pounds after the last office visit. However the edema has steadily increased. He now has +2 pitting edema in both legs.  He has stable chronic shortness of breath and dyspnea on exertion. He denies any chest pain or chest pressure. He continues to smoke. He has COPD.  At that time, my plan was: Patient is not currently taking aspirin or pradaxa. Therefore I will start the patient on warfarin 5 mg by mouth daily and recheck in 9 are in one week. The patient does have peripheral edema. He is uncontrolled on Bumex 2 mg by mouth daily. Increase bumex to 2 mg by mouth every morning and 1 mg by mouth every afternoon and recheck a BNP and swelling one week. I believe the swelling is due to right-sided heart failure due to a combination of morbid obesity, COPD and smoking, and likely sleep apnea. I recommended smoking cessation. I recommended weight loss. Also requested to perform a sleep study but the patient  refused. Recheck in [redacted] week along with INR.  05/11/14 The swelling in the patient's legs is noticeably better. They're not intensely silent. He has +1 edema today. His weight is down 4 pounds. Overall he feels better. He is due today to recheck his potassium on the higher dose of bumex. His INR today is subtherapeutic at 1.3.  At that time, my plan was: Atrial fibrillation, unspecified - Plan: PT with INR/Fingerstick, PT with INR/Fingerstick  Increase Coumadin to 10 mg on Monday, Wednesday, Friday, and Sunday. Take 5 mg on Tuesday, Thursday, Saturday. Recheck INR in one week. Continue bumex 2 mg in the morning and 1 mg in the evening. I will check a BMP today to evaluate the patient's K.  06/08/14 He is here today for recheck of his INR.  Taking 10 mg of Coumadin on Monday Wednesday Friday and Sunday. He is taking 5 mg on Tuesday Thursday and Saturday.  INR is therapeutic at 2.3.  Patient denies any bleeding or bruising. The edema in his legs is much better. He has trace bipedal edema.  At that time, my plan was: Atrial fibrillation, unspecified - Plan: PT with INR/Fingerstick Coumadin is dosed appropriately. Continue at its current dose. I also give patient flu shot.  I encouraged the patient to quit smoking yet again.  07/13/14 Here today for follow up.  INR is 2.1.  Currently on 10 mg of coumadin  M/W/F/Sun and 5 mg Tues/Thurs, Sat.  he denies any bleeding or bruising. He has not missed any doses of Coumadin. There been no changes in his medication. He is also due for recheck of his hemoglobin A1c and fasting lipid panel today for his diabetes.  At that time, my plan was:  Coumadin is appropriately dosed. No changes in his Coumadin were recommended. Recheck Coumadin in 6 weeks. I will also check a fasting lipid panel and hemoglobin A1c. Blood pressure is excellent. I continue to encourage the patient quit smoking.  10/05/14 Patient failed to follow-up in December as planned. He is here today for  recheck of his Coumadin. His INR is therapeutic at 1.6 Past Medical History  Diagnosis Date  . Hypertension   . Allergy     allergic rhinitis  . Systolic murmur   . Colon polyps   . Diabetes mellitus without complication   . Degenerative disc disease, lumbar   . Atrial fibrillation    Current Outpatient Prescriptions on File Prior to Visit  Medication Sig Dispense Refill  . albuterol (PROVENTIL HFA;VENTOLIN HFA) 108 (90 BASE) MCG/ACT inhaler Inhale 2 puffs into the lungs every 6 (six) hours as needed for wheezing or shortness of breath. 1 Inhaler 11  . aspirin 81 MG tablet Take 81 mg by mouth daily.    . bumetanide (BUMEX) 1 MG tablet 2 mg in am, 1 mg in pm 90 tablet 5  . diltiazem (CARDIZEM CD) 240 MG 24 hr capsule TAKE ONE CAPSULE BY MOUTH ONCE DAILY 30 capsule 5  . diltiazem (CARDIZEM CD) 240 MG 24 hr capsule TAKE ONE CAPSULE BY MOUTH ONCE DAILY 30 capsule 5  . fish oil-omega-3 fatty acids 1000 MG capsule Take 1 g by mouth daily.    Marland Kitchen KLOR-CON M20 20 MEQ tablet TAKE ONE TABLET BY MOUTH ONCE DAILY *NEEDS OFFICE VISIT BEFORE FURTHER REFILLS* 30 tablet 0  . KLOR-CON M20 20 MEQ tablet TAKE ONE TABLET BY MOUTH ONCE DAILY 30 tablet 5  . KLOR-CON M20 20 MEQ tablet TAKE ONE TABLET BY MOUTH ONCE DAILY 30 tablet 5  . losartan (COZAAR) 50 MG tablet Take 1 tablet (50 mg total) by mouth daily. 30 tablet 3  . metFORMIN (GLUCOPHAGE) 1000 MG tablet TAKE ONE TABLET BY MOUTH TWICE DAILY 60 tablet 5  . metolazone (ZAROXOLYN) 5 MG tablet Take 1 pill and wait 30 minutes and then take bumex.  DO NOT DO THIS EVERYDAY 10 tablet 0  . metoprolol (LOPRESSOR) 50 MG tablet TAKE 1 TABLET BY MOUTH TWICE A DAY 60 tablet 11  . metoprolol (LOPRESSOR) 50 MG tablet TAKE 1 TABLET BY MOUTH TWICE A DAY 60 tablet 3  . warfarin (COUMADIN) 5 MG tablet Take as directed 90 tablet 3   No current facility-administered medications on file prior to visit.   No Known Allergies Past Surgical History  Procedure Laterality Date    . Tee without cardioversion N/A 01/09/2013    Procedure: TRANSESOPHAGEAL ECHOCARDIOGRAM (TEE);  Surgeon: Pricilla Riffle, MD;  Location: [facility] ENDOSCOPY;  Service: Cardiovascular;  Laterality: N/A;  . Cardioversion N/A 01/09/2013    Procedure: CARDIOVERSION;  Surgeon: Pricilla Riffle, MD;  Location: [facility] ENDOSCOPY;  Service: Cardiovascular;  Laterality: N/A;   History   Social History  . Marital Status: Married    Spouse Name: N/A    Number of Children: 2  . Years of Education: N/A   Occupational History  .      Machinist   Social History  Main Topics  . Smoking status: Current Some Day Smoker -- 1.50 packs/day    Types: Cigarettes  . Smokeless tobacco: Never Used  . Alcohol Use: No  . Drug Use: No  . Sexual Activity: Not Currently   Other Topics Concern  . Not on file   Social History Narrative      Review of Systems  All other systems reviewed and are negative.      Objective:   Physical Exam  Cardiovascular: Normal rate and regular rhythm.   Murmur heard. Pulmonary/Chest: Effort normal. He has wheezes. He has no rales.  Abdominal: Soft. Bowel sounds are normal. He exhibits no distension. There is no tenderness. There is no rebound and no guarding.  Skin: No erythema.  Vitals reviewed.        Assessment & Plan:   Long term current use of anticoagulant therapy - Plan: PT with INR/Fingerstick  Chronic atrial fibrillation  Increase Coumadin to 10 mg every day except Monday and Friday. On Monday and Friday take 5 mg a day. Recheck INR in 4 weeks. Also recommended that at the four-week appointment, we need to check a hemoglobin A1c as well as a fasting lipid panel for his diabetes. I continue to encourage smoking cessation.

## 2014-11-02 ENCOUNTER — Ambulatory Visit: Payer: Self-pay | Admitting: Family Medicine

## 2014-11-09 ENCOUNTER — Ambulatory Visit (INDEPENDENT_AMBULATORY_CARE_PROVIDER_SITE_OTHER): Payer: BLUE CROSS/BLUE SHIELD | Admitting: Family Medicine

## 2014-11-09 ENCOUNTER — Encounter: Payer: Self-pay | Admitting: Family Medicine

## 2014-11-09 VITALS — BP 120/80 | HR 88 | Temp 98.4°F | Resp 20 | Wt 306.0 lb

## 2014-11-09 DIAGNOSIS — E119 Type 2 diabetes mellitus without complications: Secondary | ICD-10-CM

## 2014-11-09 DIAGNOSIS — I10 Essential (primary) hypertension: Secondary | ICD-10-CM | POA: Diagnosis not present

## 2014-11-09 DIAGNOSIS — Z23 Encounter for immunization: Secondary | ICD-10-CM | POA: Diagnosis not present

## 2014-11-09 DIAGNOSIS — I482 Chronic atrial fibrillation, unspecified: Secondary | ICD-10-CM

## 2014-11-09 DIAGNOSIS — E785 Hyperlipidemia, unspecified: Secondary | ICD-10-CM

## 2014-11-09 DIAGNOSIS — Z7901 Long term (current) use of anticoagulants: Secondary | ICD-10-CM | POA: Diagnosis not present

## 2014-11-09 LAB — COMPLETE METABOLIC PANEL WITH GFR
ALT: 16 U/L (ref 0–53)
AST: 15 U/L (ref 0–37)
Albumin: 4 g/dL (ref 3.5–5.2)
Alkaline Phosphatase: 87 U/L (ref 39–117)
BILIRUBIN TOTAL: 0.5 mg/dL (ref 0.2–1.2)
BUN: 15 mg/dL (ref 6–23)
CO2: 28 mEq/L (ref 19–32)
Calcium: 9.3 mg/dL (ref 8.4–10.5)
Chloride: 101 mEq/L (ref 96–112)
Creat: 0.93 mg/dL (ref 0.50–1.35)
GFR, EST NON AFRICAN AMERICAN: 88 mL/min
GFR, Est African American: >89 mL/min
GLUCOSE: 106 mg/dL — AB (ref 70–99)
Potassium: 5 mEq/L (ref 3.5–5.3)
SODIUM: 139 meq/L (ref 135–145)
Total Protein: 6.9 g/dL (ref 6.0–8.3)

## 2014-11-09 LAB — CBC WITH DIFFERENTIAL/PLATELET
Basophils Absolute: 0 10*3/uL (ref 0.0–0.1)
Basophils Relative: 0 % (ref 0–1)
EOS PCT: 3 % (ref 0–5)
Eosinophils Absolute: 0.2 10*3/uL (ref 0.0–0.7)
HCT: 49.4 % (ref 39.0–52.0)
HEMOGLOBIN: 15.9 g/dL (ref 13.0–17.0)
LYMPHS ABS: 1.6 10*3/uL (ref 0.7–4.0)
Lymphocytes Relative: 19 % (ref 12–46)
MCH: 27.9 pg (ref 26.0–34.0)
MCHC: 32.2 g/dL (ref 30.0–36.0)
MCV: 86.8 fL (ref 78.0–100.0)
MONO ABS: 0.9 10*3/uL (ref 0.1–1.0)
MPV: 9.8 fL (ref 8.6–12.4)
Monocytes Relative: 11 % (ref 3–12)
Neutro Abs: 5.6 10*3/uL (ref 1.7–7.7)
Neutrophils Relative %: 67 % (ref 43–77)
Platelets: 253 10*3/uL (ref 150–400)
RBC: 5.69 MIL/uL (ref 4.22–5.81)
RDW: 16.3 % — ABNORMAL HIGH (ref 11.5–15.5)
WBC: 8.3 10*3/uL (ref 4.0–10.5)

## 2014-11-09 LAB — LIPID PANEL
CHOLESTEROL: 117 mg/dL (ref 0–200)
HDL: 31 mg/dL — ABNORMAL LOW (ref >39)
LDL CALC: 70 mg/dL (ref 0–99)
Total CHOL/HDL Ratio: 3.8 Ratio
Triglycerides: 79 mg/dL (ref <150)
VLDL: 16 mg/dL (ref 0–40)

## 2014-11-09 LAB — PT WITH INR/FINGERSTICK
INR, fingerstick: 3 — ABNORMAL HIGH (ref 0.80–1.20)
PT, fingerstick: 36.4 seconds — ABNORMAL HIGH (ref 10.4–12.5)

## 2014-11-09 NOTE — Progress Notes (Signed)
Subjective:    Patient ID: William Alvarado, male    DOB: 12-Oct-1950, 64 y.o.   MRN: [id number]  HPI Patient is here today for follow-up of his multiple medical problems. His blood pressures currently well controlled at 120/80. He denies any chest pain. He does have some shortness of breath but I believe this is related to COPD. He is audibly wheezing today on examination yet he continues to smoke and has no plans of quitting smoking. He also does not want any medication such as Advair as per either due to cost. He has a history of diabetes mellitus. He is currently not trying any sputum take her diet or exercise program. His weight is stable at 306 pounds. He denies any polyuria or polydipsia or blurred vision. His diabetic foot exam is performed today and is normal. He is due for a microalbumin and A1c. He is also due for diabetic eye exam as well as Pneumovax 23. He denies any myalgias or right upper quadrant pain on his statin medication. He is also due for an INR to check his atrial fibrillation. He is currently taking 10 mg of Coumadin every day except Monday and Friday. On Monday and Friday he takes 5 mg. His INR today is therapeutic at 3.0 Past Medical History  Diagnosis Date  . Hypertension   . Allergy     allergic rhinitis  . Systolic murmur   . Colon polyps   . Diabetes mellitus without complication   . Degenerative disc disease, lumbar   . Atrial fibrillation    Past Surgical History  Procedure Laterality Date  . Tee without cardioversion N/A 01/09/2013    Procedure: TRANSESOPHAGEAL ECHOCARDIOGRAM (TEE);  Surgeon: Pricilla Riffle, MD;  Location: [facility] ENDOSCOPY;  Service: Cardiovascular;  Laterality: N/A;  . Cardioversion N/A 01/09/2013    Procedure: CARDIOVERSION;  Surgeon: Pricilla Riffle, MD;  Location: [facility] ENDOSCOPY;  Service: Cardiovascular;  Laterality: N/A;   Current Outpatient Prescriptions on File Prior to Visit  Medication Sig Dispense Refill  . bumetanide (BUMEX) 1 MG tablet 2  mg in am, 1 mg in pm 90 tablet 5  . diltiazem (CARDIZEM CD) 240 MG 24 hr capsule TAKE ONE CAPSULE BY MOUTH ONCE DAILY 30 capsule 5  . fish oil-omega-3 fatty acids 1000 MG capsule Take 1 g by mouth daily.    Marland Kitchen KLOR-CON M20 20 MEQ tablet TAKE ONE TABLET BY MOUTH ONCE DAILY 30 tablet 5  . metFORMIN (GLUCOPHAGE) 1000 MG tablet TAKE ONE TABLET BY MOUTH TWICE DAILY 60 tablet 5  . metoprolol (LOPRESSOR) 50 MG tablet TAKE 1 TABLET BY MOUTH TWICE A DAY 60 tablet 11  . warfarin (COUMADIN) 5 MG tablet Take as directed 90 tablet 3  . albuterol (PROVENTIL HFA;VENTOLIN HFA) 108 (90 BASE) MCG/ACT inhaler Inhale 2 puffs into the lungs every 6 (six) hours as needed for wheezing or shortness of breath. (Patient not taking: Reported on 11/09/2014) 1 Inhaler 11  . losartan (COZAAR) 50 MG tablet Take 1 tablet (50 mg total) by mouth daily. 30 tablet 3  . metolazone (ZAROXOLYN) 5 MG tablet Take 1 pill and wait 30 minutes and then take bumex.  DO NOT DO THIS EVERYDAY 10 tablet 0   No current facility-administered medications on file prior to visit.   No Known Allergies History   Social History  . Marital Status: Married    Spouse Name: N/A  . Number of Children: 2  . Years of Education: N/A   Occupational History  .  Machinist   Social History Main Topics  . Smoking status: Current Some Day Smoker -- 1.50 packs/day    Types: Cigarettes  . Smokeless tobacco: Never Used  . Alcohol Use: No  . Drug Use: No  . Sexual Activity: Not Currently   Other Topics Concern  . Not on file   Social History Narrative      Review of Systems  All other systems reviewed and are negative.      Objective:   Physical Exam  Constitutional: He appears well-developed and well-nourished.  Cardiovascular: Normal rate and normal heart sounds.   No murmur heard. Pulmonary/Chest: Effort normal. He has wheezes.  Abdominal: Soft. Bowel sounds are normal. He exhibits no distension. There is no tenderness. There is no  rebound and no guarding.  Musculoskeletal: He exhibits edema.  Vitals reviewed.         Assessment & Plan:  Chronic atrial fibrillation - Plan: CBC with Differential/Platelet, PT with INR/Fingerstick  Diabetes mellitus type II, non insulin dependent - Plan: COMPLETE METABOLIC PANEL WITH GFR, CBC with Differential/Platelet, Hemoglobin A1c, Lipid panel, Microalbumin, urine  Essential hypertension  HLD (hyperlipidemia)  Long term current use of anticoagulant - Plan: PT with INR/Fingerstick  Need for prophylactic vaccination against Streptococcus pneumoniae (pneumococcus) - Plan: Pneumococcal polysaccharide vaccine 23-valent greater than or equal to 2yo subcutaneous/IM  Patient's INR is therapeutic. I'll make no changes in his Coumadin dose and I will recheck his INR in 3 months. I recommended smoking cessation. I recommended diet exercise and weight loss. Patient's blood pressures well controlled. I will check a fasting lipid panel. Goal LDL is less than 100. I will also check a hemoglobin A1c and a urine microalbumin. Goal hemoglobin A1c is less than 6.5. Patient received Pneumovax 23 today in clinic. I recommended a diabetic eye exam.

## 2014-11-10 LAB — HEMOGLOBIN A1C
Hgb A1c MFr Bld: 7.2 % — ABNORMAL HIGH (ref <5.7)
MEAN PLASMA GLUCOSE: 160 mg/dL — AB (ref <117)

## 2014-11-10 LAB — MICROALBUMIN, URINE: Microalb, Ur: 158.7 mg/dL — ABNORMAL HIGH (ref <2.0)

## 2014-11-11 ENCOUNTER — Other Ambulatory Visit: Payer: Self-pay | Admitting: Family Medicine

## 2014-11-15 ENCOUNTER — Other Ambulatory Visit: Payer: Self-pay | Admitting: Family Medicine

## 2014-11-15 NOTE — Telephone Encounter (Signed)
Refill appropriate and filled per protocol. 

## 2014-11-28 ENCOUNTER — Other Ambulatory Visit: Payer: Self-pay | Admitting: Family Medicine

## 2014-11-28 MED ORDER — GLIPIZIDE ER 5 MG PO TB24
5.0000 mg | ORAL_TABLET | Freq: Every day | ORAL | Status: DC
Start: 2014-11-28 — End: 2015-11-08

## 2014-11-28 MED ORDER — LOSARTAN POTASSIUM 100 MG PO TABS: 100.0000 mg | ORAL_TABLET | Freq: Every day | ORAL | Status: DC

## 2014-12-03 ENCOUNTER — Telehealth: Payer: Self-pay | Admitting: Family Medicine

## 2014-12-03 NOTE — Telephone Encounter (Signed)
[phone]  PT wife is wanting to speak to you about fluid build up her husband is having.

## 2014-12-04 NOTE — Telephone Encounter (Signed)
Spoke to pt's wife yesterday and she stated that the pt is having increased swelling in his legs and wanted to know what he should take?  Per [facility] ok to take the Metolazone x 1 and keep F/U appt.  She verbalized understanding.

## 2014-12-21 ENCOUNTER — Encounter: Payer: Self-pay | Admitting: Family Medicine

## 2014-12-21 ENCOUNTER — Ambulatory Visit (INDEPENDENT_AMBULATORY_CARE_PROVIDER_SITE_OTHER): Payer: BLUE CROSS/BLUE SHIELD | Admitting: Family Medicine

## 2014-12-21 VITALS — BP 122/74 | HR 84 | Temp 98.8°F | Resp 22 | Ht 73.0 in | Wt 305.0 lb

## 2014-12-21 DIAGNOSIS — I482 Chronic atrial fibrillation, unspecified: Secondary | ICD-10-CM

## 2014-12-21 DIAGNOSIS — I4891 Unspecified atrial fibrillation: Secondary | ICD-10-CM | POA: Diagnosis not present

## 2014-12-21 DIAGNOSIS — E119 Type 2 diabetes mellitus without complications: Secondary | ICD-10-CM

## 2014-12-21 LAB — PT WITH INR/FINGERSTICK
INR FINGERSTICK: 3.3 — AB (ref 0.80–1.20)
PT, fingerstick: 40 seconds — ABNORMAL HIGH (ref 10.4–12.5)

## 2014-12-21 NOTE — Addendum Note (Signed)
Addended by: Legrand Rams B on: 12/21/2014 09:47 AM   Modules accepted: Orders

## 2014-12-21 NOTE — Progress Notes (Signed)
Subjective:    Patient ID: William Alvarado, male    DOB: 25-Aug-1951, 64 y.o.   MRN: [id number]  HPI  Patient is here today for follow-up of his multiple medical problems. His blood pressures currently well controlled at 122/74. He denies any chest pain. He does have some shortness of breath but I believe this is related to COPD.  He has a history of diabetes mellitus. He did not start glipizide after his last office visit hemoglobin A1c was elevated at 7.2. Instead he and his wife have made significant dietary changes and are drastically trying to reduce his sugar intake. Unfortunately he continues to smoke. He is down to 2 packs a day that he was smoking but he still smokes heavily. His urine microalbumin was elevated at his last office visit. He did increase his losartan to 100 mg a day Past Medical History  Diagnosis Date  . Hypertension   . Allergy     allergic rhinitis  . Systolic murmur   . Colon polyps   . Diabetes mellitus without complication   . Degenerative disc disease, lumbar   . Atrial fibrillation   . COPD (chronic obstructive pulmonary disease)    Past Surgical History  Procedure Laterality Date  . Tee without cardioversion N/A 01/09/2013    Procedure: TRANSESOPHAGEAL ECHOCARDIOGRAM (TEE);  Surgeon: Pricilla Riffle, MD;  Location: [facility] ENDOSCOPY;  Service: Cardiovascular;  Laterality: N/A;  . Cardioversion N/A 01/09/2013    Procedure: CARDIOVERSION;  Surgeon: Pricilla Riffle, MD;  Location: [facility] ENDOSCOPY;  Service: Cardiovascular;  Laterality: N/A;   Current Outpatient Prescriptions on File Prior to Visit  Medication Sig Dispense Refill  . bumetanide (BUMEX) 1 MG tablet TAKE 2 TABLETS BY MOUTH IN THE MORNING AND 1 AT NIGHT 90 tablet 5  . diltiazem (CARDIZEM CD) 240 MG 24 hr capsule TAKE ONE CAPSULE BY MOUTH ONCE DAILY 30 capsule 5  . fish oil-omega-3 fatty acids 1000 MG capsule Take 1 g by mouth daily.    Marland Kitchen KLOR-CON M20 20 MEQ tablet TAKE ONE TABLET BY MOUTH ONCE DAILY 30  tablet 5  . losartan (COZAAR) 100 MG tablet Take 1 tablet (100 mg total) by mouth daily. 90 tablet 3  . metFORMIN (GLUCOPHAGE) 1000 MG tablet TAKE ONE TABLET BY MOUTH TWICE DAILY 60 tablet 5  . metolazone (ZAROXOLYN) 5 MG tablet Take 1 pill and wait 30 minutes and then take bumex.  DO NOT DO THIS EVERYDAY 10 tablet 0  . metoprolol (LOPRESSOR) 50 MG tablet TAKE 1 TABLET BY MOUTH TWICE A DAY 60 tablet 11  . warfarin (COUMADIN) 5 MG tablet Take as directed 90 tablet 3  . glipiZIDE (GLIPIZIDE XL) 5 MG 24 hr tablet Take 1 tablet (5 mg total) by mouth daily with breakfast. (Patient not taking: Reported on 12/21/2014) 90 tablet 3   No current facility-administered medications on file prior to visit.   No Known Allergies History   Social History  . Marital Status: Married    Spouse Name: N/A  . Number of Children: 2  . Years of Education: N/A   Occupational History  .      Machinist   Social History Main Topics  . Smoking status: Current Some Day Smoker -- 1.50 packs/day    Types: Cigarettes  . Smokeless tobacco: Never Used  . Alcohol Use: No  . Drug Use: No  . Sexual Activity: Not Currently   Other Topics Concern  . Not on file   Social History  Narrative      Review of Systems  All other systems reviewed and are negative.      Objective:   Physical Exam  Constitutional: He appears well-developed and well-nourished.  Cardiovascular: Normal rate and normal heart sounds.   No murmur heard. Pulmonary/Chest: Effort normal. He has wheezes.  Abdominal: Soft. Bowel sounds are normal. He exhibits no distension. There is no tenderness. There is no rebound and no guarding.  Musculoskeletal: He exhibits edema.  Vitals reviewed.         Assessment & Plan:  Chronic atrial fibrillation  Diabetes mellitus type II, non insulin dependent  Patient's INR is elevated at 3.3.  I will change his Coumadin dosage to 10 mg every day except Monday Wednesday Friday. On Monday Wednesday  Friday, the patient is to take 5 mg a day. I also strongly encouraged the patient to quit smoking. Patient is in the pre-contemplative phase. I recommended we recheck his hemoglobin A1c as well as his urine microalbumin in 3 months. Otherwise recheck an INR in 6 weeks

## 2015-01-23 ENCOUNTER — Encounter: Payer: Self-pay | Admitting: Family Medicine

## 2015-02-08 ENCOUNTER — Ambulatory Visit (INDEPENDENT_AMBULATORY_CARE_PROVIDER_SITE_OTHER): Payer: BLUE CROSS/BLUE SHIELD | Admitting: Family Medicine

## 2015-02-08 ENCOUNTER — Encounter: Payer: Self-pay | Admitting: Family Medicine

## 2015-02-08 VITALS — BP 130/76 | HR 74 | Temp 98.5°F | Resp 22 | Ht 73.0 in | Wt 296.0 lb

## 2015-02-08 DIAGNOSIS — I4891 Unspecified atrial fibrillation: Secondary | ICD-10-CM | POA: Diagnosis not present

## 2015-02-08 DIAGNOSIS — I482 Chronic atrial fibrillation, unspecified: Secondary | ICD-10-CM

## 2015-02-08 DIAGNOSIS — Z716 Tobacco abuse counseling: Secondary | ICD-10-CM | POA: Diagnosis not present

## 2015-02-08 DIAGNOSIS — E119 Type 2 diabetes mellitus without complications: Secondary | ICD-10-CM

## 2015-02-08 DIAGNOSIS — Z720 Tobacco use: Secondary | ICD-10-CM | POA: Diagnosis not present

## 2015-02-08 DIAGNOSIS — I480 Paroxysmal atrial fibrillation: Secondary | ICD-10-CM

## 2015-02-08 LAB — PT WITH INR/FINGERSTICK
INR FINGERSTICK: 2.2 — AB (ref 0.80–1.20)
PT FINGERSTICK: 25.9 s — AB (ref 10.4–12.5)

## 2015-02-08 MED ORDER — VARENICLINE TARTRATE 0.5 MG X 11 & 1 MG X 42 PO MISC: ORAL | Status: DC

## 2015-02-08 MED ORDER — WARFARIN SODIUM 5 MG PO TABS: ORAL_TABLET | ORAL | Status: DC

## 2015-02-08 NOTE — Progress Notes (Signed)
Subjective:    Patient ID: William Alvarado, male    DOB: [birth year], 64 y.o.   MRN: [id number]  HPI 12/21/14 Patient is here today for follow-up of his multiple medical problems. His blood pressures currently well controlled at 122/74. He denies any chest pain. He does have some shortness of breath but I believe this is related to COPD.  He has a history of diabetes mellitus. He did not start glipizide after his last office visit hemoglobin A1c was elevated at 7.2. Instead he and his wife have made significant dietary changes and are drastically trying to reduce his sugar intake. Unfortunately he continues to smoke. He is down to 2 packs a day that he was smoking but he still smokes heavily. His urine microalbumin was elevated at his last office visit. He did increase his losartan to 100 mg a day.  At that time, my plan was: Patient's INR is elevated at 3.3.  I will change his Coumadin dosage to 10 mg every day except Monday Wednesday Friday. On Monday Wednesday Friday, the patient is to take 5 mg a day. I also strongly encouraged the patient to quit smoking. Patient is in the pre-contemplative phase. I recommended we recheck his hemoglobin A1c as well as his urine microalbumin in 3 months. Otherwise recheck an INR in 6 weeks.  02/08/15 He is here today to recheck his INR.  INR is .  HgA1c was 7.2 in 2/16.  We added glipizide at that time and he is due today for a HgA1c. Patient is trying to quit smoking. He is reduced from 2 packs a day to 5 cigarettes a day. He is also lost 10 pounds by cutting out soft drinks. Past Medical History  Diagnosis Date  . Hypertension   . Allergy     allergic rhinitis  . Systolic murmur   . Colon polyps   . Diabetes mellitus without complication   . Degenerative disc disease, lumbar   . Atrial fibrillation   . COPD (chronic obstructive pulmonary disease)    Past Surgical History  Procedure Laterality Date  . Tee without cardioversion N/A 01/09/2013   Procedure: TRANSESOPHAGEAL ECHOCARDIOGRAM (TEE);  Surgeon: Pricilla Riffle, MD;  Location: [facility] ENDOSCOPY;  Service: Cardiovascular;  Laterality: N/A;  . Cardioversion N/A 01/09/2013    Procedure: CARDIOVERSION;  Surgeon: Pricilla Riffle, MD;  Location: [facility] ENDOSCOPY;  Service: Cardiovascular;  Laterality: N/A;   Current Outpatient Prescriptions on File Prior to Visit  Medication Sig Dispense Refill  . bumetanide (BUMEX) 1 MG tablet TAKE 2 TABLETS BY MOUTH IN THE MORNING AND 1 AT NIGHT 90 tablet 5  . diltiazem (CARDIZEM CD) 240 MG 24 hr capsule TAKE ONE CAPSULE BY MOUTH ONCE DAILY 30 capsule 5  . fish oil-omega-3 fatty acids 1000 MG capsule Take 1 g by mouth daily.    Marland Kitchen glipiZIDE (GLIPIZIDE XL) 5 MG 24 hr tablet Take 1 tablet (5 mg total) by mouth daily with breakfast. (Patient not taking: Reported on 12/21/2014) 90 tablet 3  . KLOR-CON M20 20 MEQ tablet TAKE ONE TABLET BY MOUTH ONCE DAILY 30 tablet 5  . losartan (COZAAR) 100 MG tablet Take 1 tablet (100 mg total) by mouth daily. 90 tablet 3  . metFORMIN (GLUCOPHAGE) 1000 MG tablet TAKE ONE TABLET BY MOUTH TWICE DAILY 60 tablet 5  . metolazone (ZAROXOLYN) 5 MG tablet Take 1 pill and wait 30 minutes and then take bumex.  DO NOT DO THIS EVERYDAY 10 tablet 0  .  metoprolol (LOPRESSOR) 50 MG tablet TAKE 1 TABLET BY MOUTH TWICE A DAY 60 tablet 11  . warfarin (COUMADIN) 5 MG tablet Take as directed 90 tablet 3   No current facility-administered medications on file prior to visit.   No Known Allergies History   Social History  . Marital Status: Married    Spouse Name: N/A  . Number of Children: 2  . Years of Education: N/A   Occupational History  .      Machinist   Social History Main Topics  . Smoking status: Current Some Day Smoker -- 1.50 packs/day    Types: Cigarettes  . Smokeless tobacco: Never Used  . Alcohol Use: No  . Drug Use: No  . Sexual Activity: Not Currently   Other Topics Concern  . Not on file   Social History Narrative       Review of Systems  All other systems reviewed and are negative.      Objective:   Physical Exam  Constitutional: He appears well-developed and well-nourished.  Cardiovascular: Normal rate and normal heart sounds.   No murmur heard. Pulmonary/Chest: Effort normal. He has wheezes.  Abdominal: Soft. Bowel sounds are normal. He exhibits no distension. There is no tenderness. There is no rebound and no guarding.  Musculoskeletal: He exhibits edema.  Vitals reviewed.         Assessment & Plan:  Chronic atrial fibrillation - Plan: CANCELED: PT with INR/Fingerstick  Diabetes mellitus type II, non insulin dependent - Plan: Hemoglobin A1c  Atrial fibrillation, unspecified - Plan: PT with INR/Fingerstick, warfarin (COUMADIN) 5 MG tablet  Encounter for smoking cessation counseling - Plan: varenicline (CHANTIX STARTING MONTH PAK) 0.5 MG X 11 & 1 MG X 42 tablet  Paroxysmal atrial fibrillation - Plan: warfarin (COUMADIN) 5 MG tablet  INR is therapeutic today. Recheck INR in 6 weeks. I will recheck a hemoglobin A1c. I am very proud of the patient for discontinuing sodas in losing 10 pounds. I am also extremely by the patient for trying to quit smoking. We had a long discussion today and he would like to try Chantix to help him achieve his ultimate goal of smoking cessation. Therefore I gave him prescription for a starter pack. Recheck in 6 weeks

## 2015-02-09 LAB — HEMOGLOBIN A1C
Hgb A1c MFr Bld: 7.1 % — ABNORMAL HIGH (ref <5.7)
MEAN PLASMA GLUCOSE: 157 mg/dL — AB (ref <117)

## 2015-02-19 ENCOUNTER — Other Ambulatory Visit: Payer: Self-pay | Admitting: Family Medicine

## 2015-02-25 ENCOUNTER — Encounter: Payer: Self-pay | Admitting: Family Medicine

## 2015-03-21 ENCOUNTER — Other Ambulatory Visit: Payer: Self-pay | Admitting: Family Medicine

## 2015-03-29 ENCOUNTER — Ambulatory Visit: Payer: BLUE CROSS/BLUE SHIELD | Admitting: Family Medicine

## 2015-04-26 ENCOUNTER — Ambulatory Visit: Payer: BLUE CROSS/BLUE SHIELD | Admitting: Family Medicine

## 2015-05-10 ENCOUNTER — Encounter: Payer: Self-pay | Admitting: Family Medicine

## 2015-05-10 ENCOUNTER — Ambulatory Visit (INDEPENDENT_AMBULATORY_CARE_PROVIDER_SITE_OTHER): Payer: BLUE CROSS/BLUE SHIELD | Admitting: Family Medicine

## 2015-05-10 VITALS — BP 128/78 | HR 78 | Temp 98.5°F | Resp 16 | Ht 73.0 in | Wt 300.0 lb

## 2015-05-10 DIAGNOSIS — IMO0002 Reserved for concepts with insufficient information to code with codable children: Secondary | ICD-10-CM

## 2015-05-10 DIAGNOSIS — I4891 Unspecified atrial fibrillation: Secondary | ICD-10-CM | POA: Diagnosis not present

## 2015-05-10 DIAGNOSIS — E1165 Type 2 diabetes mellitus with hyperglycemia: Secondary | ICD-10-CM

## 2015-05-10 DIAGNOSIS — Z125 Encounter for screening for malignant neoplasm of prostate: Secondary | ICD-10-CM

## 2015-05-10 LAB — CBC WITH DIFFERENTIAL/PLATELET
BASOS PCT: 0 % (ref 0–1)
Basophils Absolute: 0 10*3/uL (ref 0.0–0.1)
EOS PCT: 2 % (ref 0–5)
Eosinophils Absolute: 0.2 10*3/uL (ref 0.0–0.7)
HEMATOCRIT: 45.9 % (ref 39.0–52.0)
Hemoglobin: 14.8 g/dL (ref 13.0–17.0)
LYMPHS PCT: 17 % (ref 12–46)
Lymphs Abs: 1.4 10*3/uL (ref 0.7–4.0)
MCH: 28.6 pg (ref 26.0–34.0)
MCHC: 32.2 g/dL (ref 30.0–36.0)
MCV: 88.8 fL (ref 78.0–100.0)
MONO ABS: 0.9 10*3/uL (ref 0.1–1.0)
MPV: 9.6 fL (ref 8.6–12.4)
Monocytes Relative: 11 % (ref 3–12)
Neutro Abs: 5.9 10*3/uL (ref 1.7–7.7)
Neutrophils Relative %: 70 % (ref 43–77)
Platelets: 285 10*3/uL (ref 150–400)
RBC: 5.17 MIL/uL (ref 4.22–5.81)
RDW: 17 % — AB (ref 11.5–15.5)
WBC: 8.4 10*3/uL (ref 4.0–10.5)

## 2015-05-10 LAB — LIPID PANEL
Cholesterol: 120 mg/dL — ABNORMAL LOW (ref 125–200)
HDL: 27 mg/dL — ABNORMAL LOW (ref >=40)
LDL Cholesterol: 77 mg/dL (ref <130)
TRIGLYCERIDES: 80 mg/dL (ref <150)
Total CHOL/HDL Ratio: 4.4 Ratio (ref <=5.0)
VLDL: 16 mg/dL (ref <30)

## 2015-05-10 LAB — COMPLETE METABOLIC PANEL WITH GFR
ALBUMIN: 3.9 g/dL (ref 3.6–5.1)
ALK PHOS: 80 U/L (ref 40–115)
ALT: 12 U/L (ref 9–46)
AST: 12 U/L (ref 10–35)
BUN: 14 mg/dL (ref 7–25)
CO2: 29 mmol/L (ref 20–31)
CREATININE: 0.91 mg/dL (ref 0.70–1.25)
Calcium: 9.1 mg/dL (ref 8.6–10.3)
Chloride: 100 mmol/L (ref 98–110)
GFR, Est African American: >89 mL/min (ref >=60)
GFR, Est Non African American: >89 mL/min (ref >=60)
Glucose, Bld: 106 mg/dL — ABNORMAL HIGH (ref 70–99)
Potassium: 4.8 mmol/L (ref 3.5–5.3)
Sodium: 137 mmol/L (ref 135–146)
TOTAL PROTEIN: 7 g/dL (ref 6.1–8.1)
Total Bilirubin: 0.7 mg/dL (ref 0.2–1.2)

## 2015-05-10 LAB — HEMOGLOBIN A1C
Hgb A1c MFr Bld: 6.9 % — ABNORMAL HIGH (ref <5.7)
Mean Plasma Glucose: 151 mg/dL — ABNORMAL HIGH (ref <117)

## 2015-05-10 LAB — PT WITH INR/FINGERSTICK
INR, fingerstick: 2.2 — ABNORMAL HIGH (ref 0.80–1.20)
PT, fingerstick: 26 seconds — ABNORMAL HIGH (ref 10.4–12.5)

## 2015-05-10 MED ORDER — VARENICLINE TARTRATE 0.5 MG X 11 & 1 MG X 42 PO MISC: ORAL | Status: DC

## 2015-05-10 MED ORDER — BUMETANIDE 1 MG PO TABS: ORAL_TABLET | ORAL | Status: DC

## 2015-05-10 MED ORDER — LOSARTAN POTASSIUM 100 MG PO TABS: 100.0000 mg | ORAL_TABLET | Freq: Every day | ORAL | Status: DC

## 2015-05-10 MED ORDER — POTASSIUM CHLORIDE CRYS ER 20 MEQ PO TBCR: 20.0000 meq | EXTENDED_RELEASE_TABLET | Freq: Every day | ORAL | Status: DC

## 2015-05-10 MED ORDER — METOPROLOL TARTRATE 50 MG PO TABS: 50.0000 mg | ORAL_TABLET | Freq: Two times a day (BID) | ORAL | Status: DC

## 2015-05-10 NOTE — Progress Notes (Signed)
Subjective:    Patient ID: William Alvarado, male    DOB: 1951-03-19, 64 y.o.   MRN: [id number]  HPI 12/21/14 Patient is here today for follow-up of his multiple medical problems. His blood pressures currently well controlled at 122/74. He denies any chest pain. He does have some shortness of breath but I believe this is related to COPD.  He has a history of diabetes mellitus. He did not start glipizide after his last office visit hemoglobin A1c was elevated at 7.2. Instead he and his wife have made significant dietary changes and are drastically trying to reduce his sugar intake. Unfortunately he continues to smoke. He is down to 2 packs a day that he was smoking but he still smokes heavily. His urine microalbumin was elevated at his last office visit. He did increase his losartan to 100 mg a day.  At that time, my plan was: Patient's INR is elevated at 3.3.  I will change his Coumadin dosage to 10 mg every day except Monday Wednesday Friday. On Monday Wednesday Friday, the patient is to take 5 mg a day. I also strongly encouraged the patient to quit smoking. Patient is in the pre-contemplative phase. I recommended we recheck his hemoglobin A1c as well as his urine microalbumin in 3 months. Otherwise recheck an INR in 6 weeks.  02/08/15 He is here today to recheck his INR.  INR is .  HgA1c was 7.2 in 2/16.  We added glipizide at that time and he is due today for a HgA1c. Patient is trying to quit smoking. He is reduced from 2 packs a day to 5 cigarettes a day. He is also lost 10 pounds by cutting out soft drinks.  At tha t time, my plan was: INR is therapeutic today. Recheck INR in 6 weeks. I will recheck a hemoglobin A1c. I am very proud of the patient for discontinuing sodas in losing 10 pounds. I am also extremely by the patient for trying to quit smoking. We had a long discussion today and he would like to try Chantix to help him achieve his ultimate goal of smoking cessation. Therefore I gave him  prescription for a starter pack. Recheck in 6 weeks  05/10/15 Patient is here today for follow-up. His INR is therapeutic at 2.2. He missed his last 2 appointments. He denies any strokelike symptoms. He denies any palpitations. He denies any syncope. His heart rate is controlled today at 78 bpm. He is overdue for fasting lipid panel, urine microalbumin, and a hemoglobin A1c. He is also due for a PSA. Patient continues to smoke although he is now considering smoking cessation. Past Medical History  Diagnosis Date  . Hypertension   . Allergy     allergic rhinitis  . Systolic murmur   . Colon polyps   . Diabetes mellitus without complication   . Degenerative disc disease, lumbar   . Atrial fibrillation   . COPD (chronic obstructive pulmonary disease)    Past Surgical History  Procedure Laterality Date  . Tee without cardioversion N/A 01/09/2013    Procedure: TRANSESOPHAGEAL ECHOCARDIOGRAM (TEE);  Surgeon: Pricilla Riffle, MD;  Location: [facility] ENDOSCOPY;  Service: Cardiovascular;  Laterality: N/A;  . Cardioversion N/A 01/09/2013    Procedure: CARDIOVERSION;  Surgeon: Pricilla Riffle, MD;  Location: [facility] ENDOSCOPY;  Service: Cardiovascular;  Laterality: N/A;   Current Outpatient Prescriptions on File Prior to Visit  Medication Sig Dispense Refill  . CARTIA XT 240 MG 24 hr capsule TAKE ONE  CAPSULE BY MOUTH ONCE DAILY 30 capsule 11  . diltiazem (CARDIZEM CD) 240 MG 24 hr capsule TAKE ONE CAPSULE BY MOUTH ONCE DAILY 30 capsule 5  . fish oil-omega-3 fatty acids 1000 MG capsule Take 1 g by mouth daily.    Marland Kitchen glipiZIDE (GLIPIZIDE XL) 5 MG 24 hr tablet Take 1 tablet (5 mg total) by mouth daily with breakfast. 90 tablet 3  . metFORMIN (GLUCOPHAGE) 1000 MG tablet TAKE ONE TABLET BY MOUTH TWICE DAILY 60 tablet 5  . metolazone (ZAROXOLYN) 5 MG tablet Take 1 pill and wait 30 minutes and then take bumex.  DO NOT DO THIS EVERYDAY 10 tablet 0  . warfarin (COUMADIN) 5 MG tablet Take as directed (Patient taking  differently: 10 mg. Take as directed) 90 tablet 3   No current facility-administered medications on file prior to visit.   No Known Allergies Social History   Social History  . Marital Status: Married    Spouse Name: N/A  . Number of Children: 2  . Years of Education: N/A   Occupational History  .      Machinist   Social History Main Topics  . Smoking status: Current Some Day Smoker -- 1.50 packs/day    Types: Cigarettes  . Smokeless tobacco: Never Used  . Alcohol Use: No  . Drug Use: No  . Sexual Activity: Not Currently   Other Topics Concern  . Not on file   Social History Narrative      Review of Systems  All other systems reviewed and are negative.      Objective:   Physical Exam  Constitutional: He appears well-developed and well-nourished.  Cardiovascular: Normal rate and normal heart sounds.   No murmur heard. Pulmonary/Chest: Effort normal. He has wheezes.  Abdominal: Soft. Bowel sounds are normal. He exhibits no distension. There is no tenderness. There is no rebound and no guarding.  Musculoskeletal: He exhibits edema.  Vitals reviewed.         Assessment & Plan:  Diabetes mellitus type II, uncontrolled - Plan: COMPLETE METABOLIC PANEL WITH GFR, CBC with Differential/Platelet, Lipid panel, Hemoglobin A1c, Microalbumin, urine  Atrial fibrillation, unspecified - Plan: PT with INR/Fingerstick  Prostate cancer screening - Plan: PSA  Patient's atrial fibrillation is appropriately anticoagulated on Coumadin. Check INR in 6 weeks. Heart rate is well controlled on diltiazem and metoprolol. While drawing blood, I will check a PSA. I will also check a CMP, fasting lipid panel, hemoglobin A1c, and urine microalbumin. I encouraged smoking cessation and also gave the patient a prescription for a starter pack of Chantix.

## 2015-05-11 LAB — MICROALBUMIN, URINE: MICROALB UR: 36.6 mg/dL — AB (ref <2.0)

## 2015-05-11 LAB — PSA: PSA: 0.41 ng/mL (ref <=4.00)

## 2015-05-13 ENCOUNTER — Encounter: Payer: Self-pay | Admitting: Family Medicine

## 2015-05-20 ENCOUNTER — Other Ambulatory Visit: Payer: Self-pay | Admitting: Family Medicine

## 2015-05-30 ENCOUNTER — Other Ambulatory Visit: Payer: Self-pay | Admitting: Family Medicine

## 2015-05-30 NOTE — Telephone Encounter (Signed)
Refill appropriate and filled per protocol. 

## 2015-06-24 ENCOUNTER — Encounter: Payer: Self-pay | Admitting: Family Medicine

## 2015-06-24 ENCOUNTER — Ambulatory Visit (INDEPENDENT_AMBULATORY_CARE_PROVIDER_SITE_OTHER): Payer: BLUE CROSS/BLUE SHIELD | Admitting: Family Medicine

## 2015-06-24 VITALS — BP 110/60 | HR 80 | Temp 98.4°F | Resp 22 | Ht 73.0 in | Wt 305.0 lb

## 2015-06-24 DIAGNOSIS — R600 Localized edema: Secondary | ICD-10-CM | POA: Diagnosis not present

## 2015-06-24 DIAGNOSIS — I4891 Unspecified atrial fibrillation: Secondary | ICD-10-CM

## 2015-06-24 DIAGNOSIS — J449 Chronic obstructive pulmonary disease, unspecified: Secondary | ICD-10-CM

## 2015-06-24 MED ORDER — FUROSEMIDE 80 MG PO TABS: 80.0000 mg | ORAL_TABLET | Freq: Two times a day (BID) | ORAL | Status: DC

## 2015-06-24 NOTE — Progress Notes (Signed)
Subjective:    Patient ID: William Alvarado, male    DOB: [birth year], 64 y.o.   MRN: [id number]  HPI  Wt Readings from Last 3 Encounters:  06/24/15 305 lb (138.347 kg)  05/10/15 300 lb (136.079 kg)  02/08/15 296 lb (134.265 kg)  patient reports pain in both legs. Swelling in both legs have worsened. He now has +2 edema in both legs distal to the knee. Both legs are pink with chronic venous stasis changes. He also has a 3 cm diameter venous stasis ulcer on his left mid calf as well as a small 1 cm  Skin tear on his right mid calf. He is currently taking 2 mg of Bumex in the morning and 1 mg in the evening. He is also using Zaroxolyn once a week. He continues to smoke. Echocardiogram of the heart in 2014 revealed an ejection fraction of 55-60% but elevated pulmonic artery pressure. The patient has COPD. He continues to smoke. I suspect that he has right-sided heart failure secondary to this as well as possibly untreated sleep apnea but he refuses to get a sleep study. Past Medical History  Diagnosis Date  . Hypertension   . Allergy     allergic rhinitis  . Systolic murmur   . Colon polyps   . Diabetes mellitus without complication (HCC)   . Degenerative disc disease, lumbar   . Atrial fibrillation (HCC)   . COPD (chronic obstructive pulmonary disease) (HCC)    Past Medical History  Diagnosis Date  . Hypertension   . Allergy     allergic rhinitis  . Systolic murmur   . Colon polyps   . Diabetes mellitus without complication (HCC)   . Degenerative disc disease, lumbar   . Atrial fibrillation (HCC)   . COPD (chronic obstructive pulmonary disease) (HCC)    Current Outpatient Prescriptions on File Prior to Visit  Medication Sig Dispense Refill  . bumetanide (BUMEX) 1 MG tablet TAKE 2 TABLETS BY MOUTH IN THE MORNING AND 1 AT NIGHT 90 tablet 5  . CARTIA XT 240 MG 24 hr capsule TAKE ONE CAPSULE BY MOUTH ONCE DAILY 30 capsule 11  . diltiazem (CARDIZEM CD) 240 MG 24 hr capsule TAKE ONE  CAPSULE BY MOUTH ONCE DAILY 30 capsule 5  . fish oil-omega-3 fatty acids 1000 MG capsule Take 1 g by mouth daily.    Marland Kitchen glipiZIDE (GLIPIZIDE XL) 5 MG 24 hr tablet Take 1 tablet (5 mg total) by mouth daily with breakfast. 90 tablet 3  . losartan (COZAAR) 100 MG tablet Take 1 tablet (100 mg total) by mouth daily. 90 tablet 3  . metFORMIN (GLUCOPHAGE) 1000 MG tablet TAKE ONE TABLET BY MOUTH TWICE DAILY 60 tablet 5  . metolazone (ZAROXOLYN) 5 MG tablet Take 1 pill and wait 30 minutes and then take bumex.  DO NOT DO THIS EVERYDAY 10 tablet 0  . metoprolol (LOPRESSOR) 50 MG tablet Take 1 tablet (50 mg total) by mouth 2 (two) times daily. 60 tablet 11  . potassium chloride SA (KLOR-CON M20) 20 MEQ tablet Take 1 tablet (20 mEq total) by mouth daily. 30 tablet 5  . varenicline (CHANTIX STARTING MONTH PAK) 0.5 MG X 11 & 1 MG X 42 tablet Take one 0.5 mg tablet by mouth once daily for 3 days, then increase to one 0.5 mg tablet twice daily for 4 days, then increase to one 1 mg tablet twice daily. 53 tablet 0  . warfarin (COUMADIN) 5 MG tablet Take as  directed (Patient taking differently: 10 mg. Take as directed) 90 tablet 3   No current facility-administered medications on file prior to visit.   No Known Allergies Social History   Social History  . Marital Status: Married    Spouse Name: N/A  . Number of Children: 2  . Years of Education: N/A   Occupational History  .      Machinist   Social History Main Topics  . Smoking status: Current Some Day Smoker -- 1.50 packs/day    Types: Cigarettes  . Smokeless tobacco: Never Used  . Alcohol Use: No  . Drug Use: No  . Sexual Activity: Not Currently   Other Topics Concern  . Not on file   Social History Narrative     Review of Systems  All other systems reviewed and are negative.      Objective:   Physical Exam  Neck: No JVD present.  Cardiovascular: Normal rate and normal heart sounds.   Pulmonary/Chest: Effort normal. He has wheezes.    Abdominal: Soft. Bowel sounds are normal.  Musculoskeletal: He exhibits edema.  Skin: There is erythema.  Vitals reviewed.         Assessment & Plan:  Bilateral leg edema - Plan: furosemide (LASIX) 80 MG tablet  Atrial fibrillation, unspecified  COPD (chronic obstructive pulmonary disease) with chronic bronchitis (HCC)  I believe the patient has bilateral leg edema secondary to a combination of venous insufficiency, and likely right-sided heart failure secondary to untreated sleep apnea, COPD, and smoking. Discontinue Bumex.. Begin Lasix 80 mg by mouth twice a day. Recheck in one week. If no better in 1 week, I would add schedule Zaroxolyn Monday Wednesday Friday and repeat an echocardiogram of the heart

## 2015-06-28 ENCOUNTER — Ambulatory Visit: Payer: BLUE CROSS/BLUE SHIELD | Admitting: Family Medicine

## 2015-07-01 ENCOUNTER — Encounter: Payer: Self-pay | Admitting: Family Medicine

## 2015-07-01 ENCOUNTER — Ambulatory Visit (INDEPENDENT_AMBULATORY_CARE_PROVIDER_SITE_OTHER): Payer: BLUE CROSS/BLUE SHIELD | Admitting: Family Medicine

## 2015-07-01 VITALS — BP 112/70 | HR 98 | Temp 99.0°F | Resp 18 | Wt 313.0 lb

## 2015-07-01 DIAGNOSIS — R0600 Dyspnea, unspecified: Secondary | ICD-10-CM

## 2015-07-01 DIAGNOSIS — R609 Edema, unspecified: Secondary | ICD-10-CM | POA: Diagnosis not present

## 2015-07-01 NOTE — Progress Notes (Signed)
Subjective:    Patient ID: William Alvarado, male    DOB: 1951/01/01, 64 y.o.   MRN: [id number]  HPI  06/24/15 Wt Readings from Last 3 Encounters:  06/24/15 305 lb (138.347 kg)  05/10/15 300 lb (136.079 kg)  02/08/15 296 lb (134.265 kg)  patient reports pain in both legs. Swelling in both legs have worsened. He now has +2 edema in both legs distal to the knee. Both legs are pink with chronic venous stasis changes. He also has a 3 cm diameter venous stasis ulcer on his left mid calf as well as a small 1 cm  Skin tear on his right mid calf. He is currently taking 2 mg of Bumex in the morning and 1 mg in the evening. He is also using Zaroxolyn once a week. He continues to smoke. Echocardiogram of the heart in 2014 revealed an ejection fraction of 55-60% but elevated pulmonic artery pressure. The patient has COPD. He continues to smoke. I suspect that he has right-sided heart failure secondary to this as well as possibly untreated sleep apnea but he refuses to get a sleep study.  At that time, my plan was: I believe the patient has bilateral leg edema secondary to a combination of venous insufficiency, and likely right-sided heart failure secondary to untreated sleep apnea, COPD, and smoking. Discontinue Bumex.. Begin Lasix 80 mg by mouth twice a day. Recheck in one week. If no better in 1 week, I would add schedule Zaroxolyn Monday Wednesday Friday and repeat an echocardiogram of the heart.  07/01/15 Here today for follow up.   Unfortunately, the patient has gained 8 pounds since last week. He also reports worsening dyspnea. He is not responding to Lasix. He denies any chest pain. He has tense pitting edema in both legs Past Medical History  Diagnosis Date  . Hypertension   . Allergy     allergic rhinitis  . Systolic murmur   . Colon polyps   . Diabetes mellitus without complication (HCC)   . Degenerative disc disease, lumbar   . Atrial fibrillation (HCC)   . COPD (chronic obstructive  pulmonary disease) (HCC)    Past Medical History  Diagnosis Date  . Hypertension   . Allergy     allergic rhinitis  . Systolic murmur   . Colon polyps   . Diabetes mellitus without complication (HCC)   . Degenerative disc disease, lumbar   . Atrial fibrillation (HCC)   . COPD (chronic obstructive pulmonary disease) (HCC)    Current Outpatient Prescriptions on File Prior to Visit  Medication Sig Dispense Refill  . bumetanide (BUMEX) 1 MG tablet TAKE 2 TABLETS BY MOUTH IN THE MORNING AND 1 AT NIGHT 90 tablet 5  . CARTIA XT 240 MG 24 hr capsule TAKE ONE CAPSULE BY MOUTH ONCE DAILY 30 capsule 11  . diltiazem (CARDIZEM CD) 240 MG 24 hr capsule TAKE ONE CAPSULE BY MOUTH ONCE DAILY 30 capsule 5  . fish oil-omega-3 fatty acids 1000 MG capsule Take 1 g by mouth daily.    . furosemide (LASIX) 80 MG tablet Take 1 tablet (80 mg total) by mouth 2 (two) times daily. 60 tablet 3  . glipiZIDE (GLIPIZIDE XL) 5 MG 24 hr tablet Take 1 tablet (5 mg total) by mouth daily with breakfast. 90 tablet 3  . losartan (COZAAR) 100 MG tablet Take 1 tablet (100 mg total) by mouth daily. 90 tablet 3  . metFORMIN (GLUCOPHAGE) 1000 MG tablet TAKE ONE TABLET BY MOUTH TWICE  DAILY 60 tablet 5  . metolazone (ZAROXOLYN) 5 MG tablet Take 1 pill and wait 30 minutes and then take bumex.  DO NOT DO THIS EVERYDAY 10 tablet 0  . metoprolol (LOPRESSOR) 50 MG tablet Take 1 tablet (50 mg total) by mouth 2 (two) times daily. 60 tablet 11  . potassium chloride SA (KLOR-CON M20) 20 MEQ tablet Take 1 tablet (20 mEq total) by mouth daily. 30 tablet 5  . varenicline (CHANTIX STARTING MONTH PAK) 0.5 MG X 11 & 1 MG X 42 tablet Take one 0.5 mg tablet by mouth once daily for 3 days, then increase to one 0.5 mg tablet twice daily for 4 days, then increase to one 1 mg tablet twice daily. 53 tablet 0  . warfarin (COUMADIN) 5 MG tablet Take as directed (Patient taking differently: 10 mg. Take as directed) 90 tablet 3   No current  facility-administered medications on file prior to visit.   No Known Allergies Social History   Social History  . Marital Status: Married    Spouse Name: N/A  . Number of Children: 2  . Years of Education: N/A   Occupational History  .      Machinist   Social History Main Topics  . Smoking status: Current Some Day Smoker -- 1.50 packs/day    Types: Cigarettes  . Smokeless tobacco: Never Used  . Alcohol Use: No  . Drug Use: No  . Sexual Activity: Not Currently   Other Topics Concern  . Not on file   Social History Narrative     Review of Systems  All other systems reviewed and are negative.      Objective:   Physical Exam  Neck: No JVD present.  Cardiovascular: Normal rate and normal heart sounds.   Pulmonary/Chest: Effort normal. He has wheezes.  Abdominal: Soft. Bowel sounds are normal.  Musculoskeletal: He exhibits edema.  Skin: There is erythema.  Vitals reviewed.         Assessment & Plan:  Edema, unspecified type - Plan: COMPLETE METABOLIC PANEL WITH GFR, CBC with Differential/Platelet, Echocardiogram  Dyspnea - Plan: Echocardiogram   scheduled patient for an echocardiogram to evaluate for congestive heart failure. Check renal function and potassium with a BMP. Resume Bumex 2 mg by mouth every morning and 1 mg by mouth daily at bedtime. Add Zaroxolyn 5 mg by mouth on Mondays Wednesdays and Fridays. Recheck renal function in 1 week. Monitor for dehydration and hypokalemia. I encouraged smoking cessation again.

## 2015-07-02 LAB — COMPLETE METABOLIC PANEL WITH GFR
ALT: 10 U/L (ref 9–46)
AST: 11 U/L (ref 10–35)
Albumin: 3.6 g/dL (ref 3.6–5.1)
Alkaline Phosphatase: 72 U/L (ref 40–115)
BUN: 12 mg/dL (ref 7–25)
CALCIUM: 8.3 mg/dL — AB (ref 8.6–10.3)
CHLORIDE: 98 mmol/L (ref 98–110)
CO2: 33 mmol/L — AB (ref 20–31)
Creat: 0.91 mg/dL (ref 0.70–1.25)
GFR, Est African American: >89 mL/min (ref >=60)
GFR, Est Non African American: >89 mL/min (ref >=60)
Glucose, Bld: 109 mg/dL — ABNORMAL HIGH (ref 70–99)
POTASSIUM: 4 mmol/L (ref 3.5–5.3)
SODIUM: 136 mmol/L (ref 135–146)
Total Bilirubin: 0.5 mg/dL (ref 0.2–1.2)
Total Protein: 6.5 g/dL (ref 6.1–8.1)

## 2015-07-02 LAB — CBC WITH DIFFERENTIAL/PLATELET
BASOS ABS: 0 10*3/uL (ref 0.0–0.1)
Basophils Relative: 0 % (ref 0–1)
Eosinophils Absolute: 0.2 10*3/uL (ref 0.0–0.7)
Eosinophils Relative: 2 % (ref 0–5)
HEMATOCRIT: 43.6 % (ref 39.0–52.0)
HEMOGLOBIN: 14.3 g/dL (ref 13.0–17.0)
LYMPHS ABS: 1.6 10*3/uL (ref 0.7–4.0)
LYMPHS PCT: 16 % (ref 12–46)
MCH: 28.4 pg (ref 26.0–34.0)
MCHC: 32.8 g/dL (ref 30.0–36.0)
MCV: 86.5 fL (ref 78.0–100.0)
MONO ABS: 1 10*3/uL (ref 0.1–1.0)
MPV: 9.4 fL (ref 8.6–12.4)
Monocytes Relative: 10 % (ref 3–12)
NEUTROS ABS: 7.1 10*3/uL (ref 1.7–7.7)
Neutrophils Relative %: 72 % (ref 43–77)
Platelets: 324 10*3/uL (ref 150–400)
RBC: 5.04 MIL/uL (ref 4.22–5.81)
RDW: 16.3 % — ABNORMAL HIGH (ref 11.5–15.5)
WBC: 9.8 10*3/uL (ref 4.0–10.5)

## 2015-07-08 ENCOUNTER — Ambulatory Visit (INDEPENDENT_AMBULATORY_CARE_PROVIDER_SITE_OTHER): Payer: BLUE CROSS/BLUE SHIELD | Admitting: Family Medicine

## 2015-07-08 ENCOUNTER — Encounter: Payer: Self-pay | Admitting: Family Medicine

## 2015-07-08 VITALS — BP 118/64 | HR 82 | Temp 99.7°F | Resp 22 | Ht 73.0 in | Wt 296.0 lb

## 2015-07-08 DIAGNOSIS — I482 Chronic atrial fibrillation, unspecified: Secondary | ICD-10-CM

## 2015-07-08 DIAGNOSIS — R609 Edema, unspecified: Secondary | ICD-10-CM | POA: Diagnosis not present

## 2015-07-08 LAB — PT WITH INR/FINGERSTICK
INR, fingerstick: 2 — ABNORMAL HIGH (ref 0.80–1.20)
PT, fingerstick: 23.8 seconds — ABNORMAL HIGH (ref 10.4–12.5)

## 2015-07-08 NOTE — Progress Notes (Signed)
Subjective:    Patient ID: William Alvarado, male    DOB: 1951/04/16, 64 y.o.   MRN: [id number]  HPI  06/24/15 Wt Readings from Last 3 Encounters:  07/01/15 313 lb (141.976 kg)  06/24/15 305 lb (138.347 kg)  05/10/15 300 lb (136.079 kg)  patient reports pain in both legs. Swelling in both legs have worsened. He now has +2 edema in both legs distal to the knee. Both legs are pink with chronic venous stasis changes. He also has a 3 cm diameter venous stasis ulcer on his left mid calf as well as a small 1 cm  Skin tear on his right mid calf. He is currently taking 2 mg of Bumex in the morning and 1 mg in the evening. He is also using Zaroxolyn once a week. He continues to smoke. Echocardiogram of the heart in 2014 revealed an ejection fraction of 55-60% but elevated pulmonic artery pressure. The patient has COPD. He continues to smoke. I suspect that he has right-sided heart failure secondary to this as well as possibly untreated sleep apnea but he refuses to get a sleep study.  At that time, my plan was: I believe the patient has bilateral leg edema secondary to a combination of venous insufficiency, and likely right-sided heart failure secondary to untreated sleep apnea, COPD, and smoking. Discontinue Bumex.. Begin Lasix 80 mg by mouth twice a day. Recheck in one week. If no better in 1 week, I would add schedule Zaroxolyn Monday Wednesday Friday and repeat an echocardiogram of the heart.  07/01/15 Here today for follow up.   Unfortunately, the patient has gained 8 pounds since last week. He also reports worsening dyspnea. He is not responding to Lasix. He denies any chest pain. He has tense pitting edema in both legs.  AT that time, my plan was:  scheduled patient for an echocardiogram to evaluate for congestive heart failure. Check renal function and potassium with a BMP. Resume Bumex 2 mg by mouth every morning and 1 mg by mouth daily at bedtime. Add Zaroxolyn 5 mg by mouth on Mondays Wednesdays  and Fridays. Recheck renal function in 1 week. Monitor for dehydration and hypokalemia. I encouraged smoking cessation again.  07/07/16 Here today for follow up.   Patient has lost 17 pounds since last week. This is the lowest his weight has been since August. The swelling in his legs is much better although he continues to have pain in his legs. He also has weeping venous stasis ulcers on the anterior shin which are small but cause him significant discomfort. He denies any cramps or dry mouth. He is also due for an INR check Past Medical History  Diagnosis Date  . Hypertension   . Allergy     allergic rhinitis  . Systolic murmur   . Colon polyps   . Diabetes mellitus without complication (HCC)   . Degenerative disc disease, lumbar   . Atrial fibrillation (HCC)   . COPD (chronic obstructive pulmonary disease) (HCC)    Past Medical History  Diagnosis Date  . Hypertension   . Allergy     allergic rhinitis  . Systolic murmur   . Colon polyps   . Diabetes mellitus without complication (HCC)   . Degenerative disc disease, lumbar   . Atrial fibrillation (HCC)   . COPD (chronic obstructive pulmonary disease) (HCC)    Current Outpatient Prescriptions on File Prior to Visit  Medication Sig Dispense Refill  . bumetanide (BUMEX) 1 MG tablet TAKE  2 TABLETS BY MOUTH IN THE MORNING AND 1 AT NIGHT 90 tablet 5  . CARTIA XT 240 MG 24 hr capsule TAKE ONE CAPSULE BY MOUTH ONCE DAILY 30 capsule 11  . diltiazem (CARDIZEM CD) 240 MG 24 hr capsule TAKE ONE CAPSULE BY MOUTH ONCE DAILY 30 capsule 5  . fish oil-omega-3 fatty acids 1000 MG capsule Take 1 g by mouth daily.    . furosemide (LASIX) 80 MG tablet Take 1 tablet (80 mg total) by mouth 2 (two) times daily. 60 tablet 3  . glipiZIDE (GLIPIZIDE XL) 5 MG 24 hr tablet Take 1 tablet (5 mg total) by mouth daily with breakfast. 90 tablet 3  . losartan (COZAAR) 100 MG tablet Take 1 tablet (100 mg total) by mouth daily. 90 tablet 3  . metFORMIN (GLUCOPHAGE)  1000 MG tablet TAKE ONE TABLET BY MOUTH TWICE DAILY 60 tablet 5  . metolazone (ZAROXOLYN) 5 MG tablet Take 1 pill and wait 30 minutes and then take bumex.  DO NOT DO THIS EVERYDAY 10 tablet 0  . metoprolol (LOPRESSOR) 50 MG tablet Take 1 tablet (50 mg total) by mouth 2 (two) times daily. 60 tablet 11  . potassium chloride SA (KLOR-CON M20) 20 MEQ tablet Take 1 tablet (20 mEq total) by mouth daily. 30 tablet 5  . varenicline (CHANTIX STARTING MONTH PAK) 0.5 MG X 11 & 1 MG X 42 tablet Take one 0.5 mg tablet by mouth once daily for 3 days, then increase to one 0.5 mg tablet twice daily for 4 days, then increase to one 1 mg tablet twice daily. 53 tablet 0  . warfarin (COUMADIN) 5 MG tablet Take as directed (Patient taking differently: 10 mg. Take as directed) 90 tablet 3   No current facility-administered medications on file prior to visit.   No Known Allergies Social History   Social History  . Marital Status: Married    Spouse Name: N/A  . Number of Children: 2  . Years of Education: N/A   Occupational History  .      Machinist   Social History Main Topics  . Smoking status: Current Some Day Smoker -- 1.50 packs/day    Types: Cigarettes  . Smokeless tobacco: Never Used  . Alcohol Use: No  . Drug Use: No  . Sexual Activity: Not Currently   Other Topics Concern  . Not on file   Social History Narrative     Review of Systems  All other systems reviewed and are negative.      Objective:   Physical Exam  Neck: No JVD present.  Cardiovascular: Normal rate and normal heart sounds.   Pulmonary/Chest: Effort normal. He has wheezes.  Abdominal: Soft. Bowel sounds are normal.  Musculoskeletal: He exhibits edema.  Skin: There is erythema.  Vitals reviewed.         Assessment & Plan:  Edema, unspecified type - Plan: Basic Metabolic Panel  Chronic atrial fibrillation (HCC) - Plan: PT with INR/Fingerstick   I will decrease Zaroxolyn to twice a week on Mondays and  Thursdays to avoid dehydration and excessive diuresis. I would like to maintain his weight around 296 pounds. I believe if we can maintain the weight there, the venous stasis ulcers on his William Alvarado will heal over the next 2-3 weeks as long as he is compliant with wearing his compression stockings. Recheck if no better in 2-3 weeks. I will also check a BMp today to monitor his renal function as well as his potassium. I  will also check his INR. Recheck INR in 6 weeks

## 2015-07-09 LAB — BASIC METABOLIC PANEL
BUN: 21 mg/dL (ref 7–25)
CHLORIDE: 91 mmol/L — AB (ref 98–110)
CO2: 35 mmol/L — AB (ref 20–31)
CREATININE: 0.97 mg/dL (ref 0.70–1.25)
Calcium: 9.6 mg/dL (ref 8.6–10.3)
Glucose, Bld: 117 mg/dL — ABNORMAL HIGH (ref 70–99)
Potassium: 3.5 mmol/L (ref 3.5–5.3)
Sodium: 136 mmol/L (ref 135–146)

## 2015-07-11 ENCOUNTER — Telehealth: Payer: Self-pay | Admitting: Family Medicine

## 2015-07-11 MED ORDER — BUMETANIDE 1 MG PO TABS: ORAL_TABLET | ORAL | Status: DC

## 2015-07-11 NOTE — Telephone Encounter (Signed)
Pt is calling for a refill of his kidney medication for fluid. He uses the [facility] pharmacy in [place]. Pt's # [phone]

## 2015-07-11 NOTE — Telephone Encounter (Signed)
Bumex sent to [facility]

## 2015-07-15 ENCOUNTER — Other Ambulatory Visit: Payer: Self-pay | Admitting: *Deleted

## 2015-07-15 DIAGNOSIS — R609 Edema, unspecified: Secondary | ICD-10-CM

## 2015-07-15 MED ORDER — METOLAZONE 5 MG PO TABS: ORAL_TABLET | ORAL | Status: DC

## 2015-07-15 NOTE — Telephone Encounter (Signed)
Received call from patient wife, Eunice Blase.   Reports that patient requires refill on Zaroxolyn.   Prescription sent to pharmacy.

## 2015-08-07 ENCOUNTER — Telehealth: Payer: Self-pay | Admitting: Family Medicine

## 2015-08-07 NOTE — Telephone Encounter (Signed)
Pt's wife is calling to check the status of his echocardiogram referral.  [phone]

## 2015-08-08 ENCOUNTER — Other Ambulatory Visit: Payer: Self-pay | Admitting: Family Medicine

## 2015-08-08 DIAGNOSIS — R0600 Dyspnea, unspecified: Secondary | ICD-10-CM

## 2015-08-08 DIAGNOSIS — R609 Edema, unspecified: Secondary | ICD-10-CM

## 2015-08-08 NOTE — Telephone Encounter (Signed)
Called pt wife no answer or vm set up, I contacted pt and he is aware of appt scheduled for Nov 22 at 3pm at [facility] cardiology [address]

## 2015-08-13 ENCOUNTER — Other Ambulatory Visit (HOSPITAL_COMMUNITY): Payer: Self-pay

## 2015-08-19 ENCOUNTER — Encounter: Payer: Self-pay | Admitting: Family Medicine

## 2015-08-19 ENCOUNTER — Ambulatory Visit (INDEPENDENT_AMBULATORY_CARE_PROVIDER_SITE_OTHER): Payer: BLUE CROSS/BLUE SHIELD | Admitting: Family Medicine

## 2015-08-19 VITALS — BP 100/64 | HR 80 | Temp 98.4°F | Resp 22 | Ht 73.0 in | Wt 296.0 lb

## 2015-08-19 DIAGNOSIS — I4891 Unspecified atrial fibrillation: Secondary | ICD-10-CM

## 2015-08-19 DIAGNOSIS — R609 Edema, unspecified: Secondary | ICD-10-CM

## 2015-08-19 LAB — PT WITH INR/FINGERSTICK
INR FINGERSTICK: 3.5 — AB (ref 0.80–1.20)
PT, fingerstick: 41.4 seconds — ABNORMAL HIGH (ref 10.4–12.5)

## 2015-08-19 MED ORDER — METOLAZONE 5 MG PO TABS
ORAL_TABLET | ORAL | Status: DC
Start: 2015-08-19 — End: 2015-12-16

## 2015-08-19 NOTE — Progress Notes (Signed)
Subjective:    Patient ID: William Alvarado, male    DOB: 10-Jun-1951, 64 y.o.   MRN: [id number]  HPI  06/24/15 Wt Readings from Last 3 Encounters:  08/19/15 296 lb (134.265 kg)  07/08/15 296 lb (134.265 kg)  07/01/15 313 lb (141.976 kg)  patient reports pain in both legs. Swelling in both legs have worsened. He now has +2 edema in both legs distal to the knee. Both legs are pink with chronic venous stasis changes. He also has a 3 cm diameter venous stasis ulcer on his left mid calf as well as a small 1 cm  Skin tear on his right mid calf. He is currently taking 2 mg of Bumex in the morning and 1 mg in the evening. He is also using Zaroxolyn once a week. He continues to smoke. Echocardiogram of the heart in 2014 revealed an ejection fraction of 55-60% but elevated pulmonic artery pressure. The patient has COPD. He continues to smoke. I suspect that he has right-sided heart failure secondary to this as well as possibly untreated sleep apnea but he refuses to get a sleep study.  At that time, my plan was: I believe the patient has bilateral leg edema secondary to a combination of venous insufficiency, and likely right-sided heart failure secondary to untreated sleep apnea, COPD, and smoking. Discontinue Bumex.. Begin Lasix 80 mg by mouth twice a day. Recheck in one week. If no better in 1 week, I would add schedule Zaroxolyn Monday Wednesday Friday and repeat an echocardiogram of the heart.  07/01/15 Here today for follow up.   Unfortunately, the patient has gained 8 pounds since last week. He also reports worsening dyspnea. He is not responding to Lasix. He denies any chest pain. He has tense pitting edema in both legs.  AT that time, my plan was:  scheduled patient for an echocardiogram to evaluate for congestive heart failure. Check renal function and potassium with a BMP. Resume Bumex 2 mg by mouth every morning and 1 mg by mouth daily at bedtime. Add Zaroxolyn 5 mg by mouth on Mondays Wednesdays  and Fridays. Recheck renal function in 1 week. Monitor for dehydration and hypokalemia. I encouraged smoking cessation again.  07/07/16 Here today for follow up.   Patient has lost 17 pounds since last week. This is the lowest his weight has been since August. The swelling in his legs is much better although he continues to have pain in his legs. He also has weeping venous stasis ulcers on the anterior shin which are small but cause him significant discomfort. He denies any cramps or dry mouth. He is also due for an INR check.  At that time, my plan was:  I will decrease Zaroxolyn to twice a week on Mondays and Thursdays to avoid dehydration and excessive diuresis. I would like to maintain his weight around 296 pounds. I believe if we can maintain the weight there, the venous stasis ulcers on his [name] will heal over the next 2-3 weeks as long as he is compliant with wearing his compression stockings. Recheck if no better in 2-3 weeks. I will also check a BMp today to monitor his renal function as well as his potassium. I will also check his INR. Recheck INR in 6 weeks  08/19/15 Patient's Coumadin today is supratherapeutic at 3.5. However he did admit that his diet changed substantially over Thanksgiving that may account for this. He states that he did not have any salads or greens over the  holiday. He is back up to smoking 1 pack of cigarettes a day. However his weight remains stable at 296 pounds. The edema in his legs is well controlled. He has been able to decrease his usage of Zaroxolyn to 1-2 days per week as needed. Past Medical History  Diagnosis Date  . Hypertension   . Allergy     allergic rhinitis  . Systolic murmur   . Colon polyps   . Diabetes mellitus without complication (HCC)   . Degenerative disc disease, lumbar   . Atrial fibrillation (HCC)   . COPD (chronic obstructive pulmonary disease) (HCC)    Past Medical History  Diagnosis Date  . Hypertension   . Allergy      allergic rhinitis  . Systolic murmur   . Colon polyps   . Diabetes mellitus without complication (HCC)   . Degenerative disc disease, lumbar   . Atrial fibrillation (HCC)   . COPD (chronic obstructive pulmonary disease) (HCC)    Current Outpatient Prescriptions on File Prior to Visit  Medication Sig Dispense Refill  . bumetanide (BUMEX) 1 MG tablet TAKE 2 TABLETS BY MOUTH IN THE MORNING AND 1 AT NIGHT 90 tablet 5  . CARTIA XT 240 MG 24 hr capsule TAKE ONE CAPSULE BY MOUTH ONCE DAILY 30 capsule 11  . diltiazem (CARDIZEM CD) 240 MG 24 hr capsule TAKE ONE CAPSULE BY MOUTH ONCE DAILY 30 capsule 5  . fish oil-omega-3 fatty acids 1000 MG capsule Take 1 g by mouth daily.    Marland Kitchen glipiZIDE (GLIPIZIDE XL) 5 MG 24 hr tablet Take 1 tablet (5 mg total) by mouth daily with breakfast. 90 tablet 3  . losartan (COZAAR) 100 MG tablet Take 1 tablet (100 mg total) by mouth daily. 90 tablet 3  . metFORMIN (GLUCOPHAGE) 1000 MG tablet TAKE ONE TABLET BY MOUTH TWICE DAILY 60 tablet 5  . metolazone (ZAROXOLYN) 5 MG tablet Take 1 pill and wait 30 minutes and then take Bumex on M/Th.  DO NOT DO THIS EVERYDAY 10 tablet 0  . metoprolol (LOPRESSOR) 50 MG tablet Take 1 tablet (50 mg total) by mouth 2 (two) times daily. 60 tablet 11  . potassium chloride SA (KLOR-CON M20) 20 MEQ tablet Take 1 tablet (20 mEq total) by mouth daily. 30 tablet 5  . warfarin (COUMADIN) 5 MG tablet Take as directed (Patient taking differently: 10 mg. Take as directed) 90 tablet 3   No current facility-administered medications on file prior to visit.   No Known Allergies Social History   Social History  . Marital Status: Married    Spouse Name: N/A  . Number of Children: 2  . Years of Education: N/A   Occupational History  .      Machinist   Social History Main Topics  . Smoking status: Current Some Day Smoker -- 1.50 packs/day    Types: Cigarettes  . Smokeless tobacco: Never Used  . Alcohol Use: No  . Drug Use: No  . Sexual  Activity: Not Currently   Other Topics Concern  . Not on file   Social History Narrative     Review of Systems  All other systems reviewed and are negative.      Objective:   Physical Exam  Neck: No JVD present.  Cardiovascular: Normal rate and normal heart sounds.   Pulmonary/Chest: Effort normal. He has wheezes.  Abdominal: Soft. Bowel sounds are normal.  Musculoskeletal: He exhibits edema.  Skin: There is erythema.  Vitals reviewed.  Assessment & Plan:  Atrial fibrillation, unspecified - Plan: PT with INR/Fingerstick I believe the change in his INR is reflective of his change in diet. His previous INR has been well-controlled and stable. Therefore I'll have the patient temporarily discontinue Coumadin. I recommended he not take it for the next 48 hours and then resume his chronic dose. Recheck INR in 4 weeks. Continue Zaroxolyn at 1-2 days a week and Bumex every day. I strongly encouraged smoking cessation. It is next office visit I would like him to come in fasting for fasting lipid panel as well as hemoglobin A1c

## 2015-09-20 ENCOUNTER — Other Ambulatory Visit: Payer: Self-pay | Admitting: Family Medicine

## 2015-09-27 ENCOUNTER — Other Ambulatory Visit: Payer: Self-pay | Admitting: Family Medicine

## 2015-09-27 ENCOUNTER — Ambulatory Visit (HOSPITAL_COMMUNITY): Payer: BLUE CROSS/BLUE SHIELD | Attending: Cardiovascular Disease

## 2015-09-27 ENCOUNTER — Other Ambulatory Visit: Payer: Self-pay

## 2015-09-27 DIAGNOSIS — I059 Rheumatic mitral valve disease, unspecified: Secondary | ICD-10-CM | POA: Insufficient documentation

## 2015-09-27 DIAGNOSIS — I10 Essential (primary) hypertension: Secondary | ICD-10-CM | POA: Insufficient documentation

## 2015-09-27 DIAGNOSIS — I517 Cardiomegaly: Secondary | ICD-10-CM | POA: Diagnosis not present

## 2015-09-27 DIAGNOSIS — Z6839 Body mass index (BMI) 39.0-39.9, adult: Secondary | ICD-10-CM | POA: Diagnosis not present

## 2015-09-27 DIAGNOSIS — I351 Nonrheumatic aortic (valve) insufficiency: Secondary | ICD-10-CM | POA: Insufficient documentation

## 2015-09-27 DIAGNOSIS — R0600 Dyspnea, unspecified: Secondary | ICD-10-CM

## 2015-09-27 DIAGNOSIS — E119 Type 2 diabetes mellitus without complications: Secondary | ICD-10-CM | POA: Diagnosis not present

## 2015-09-27 DIAGNOSIS — R609 Edema, unspecified: Secondary | ICD-10-CM | POA: Diagnosis not present

## 2015-09-30 ENCOUNTER — Ambulatory Visit: Payer: BLUE CROSS/BLUE SHIELD | Admitting: Family Medicine

## 2015-10-01 ENCOUNTER — Other Ambulatory Visit: Payer: Self-pay | Admitting: Family Medicine

## 2015-10-11 ENCOUNTER — Ambulatory Visit (INDEPENDENT_AMBULATORY_CARE_PROVIDER_SITE_OTHER): Payer: BLUE CROSS/BLUE SHIELD | Admitting: Family Medicine

## 2015-10-11 ENCOUNTER — Encounter: Payer: Self-pay | Admitting: Family Medicine

## 2015-10-11 VITALS — BP 88/48 | HR 72 | Temp 98.3°F | Resp 20 | Ht 73.0 in | Wt 294.0 lb

## 2015-10-11 DIAGNOSIS — Z23 Encounter for immunization: Secondary | ICD-10-CM

## 2015-10-11 DIAGNOSIS — E785 Hyperlipidemia, unspecified: Secondary | ICD-10-CM

## 2015-10-11 DIAGNOSIS — R609 Edema, unspecified: Secondary | ICD-10-CM | POA: Diagnosis not present

## 2015-10-11 DIAGNOSIS — I10 Essential (primary) hypertension: Secondary | ICD-10-CM

## 2015-10-11 DIAGNOSIS — I4891 Unspecified atrial fibrillation: Secondary | ICD-10-CM

## 2015-10-11 DIAGNOSIS — E119 Type 2 diabetes mellitus without complications: Secondary | ICD-10-CM | POA: Diagnosis not present

## 2015-10-11 LAB — COMPLETE METABOLIC PANEL WITH GFR
ALBUMIN: 4 g/dL (ref 3.6–5.1)
ALK PHOS: 71 U/L (ref 40–115)
ALT: 22 U/L (ref 9–46)
AST: 16 U/L (ref 10–35)
BILIRUBIN TOTAL: 0.5 mg/dL (ref 0.2–1.2)
BUN: 20 mg/dL (ref 7–25)
CALCIUM: 8.7 mg/dL (ref 8.6–10.3)
CO2: 29 mmol/L (ref 20–31)
CREATININE: 1.08 mg/dL (ref 0.70–1.25)
Chloride: 98 mmol/L (ref 98–110)
GFR, EST AFRICAN AMERICAN: 85 mL/min (ref >=60)
GFR, EST NON AFRICAN AMERICAN: 73 mL/min (ref >=60)
Glucose, Bld: 119 mg/dL — ABNORMAL HIGH (ref 70–99)
POTASSIUM: 3.8 mmol/L (ref 3.5–5.3)
SODIUM: 135 mmol/L (ref 135–146)
TOTAL PROTEIN: 7 g/dL (ref 6.1–8.1)

## 2015-10-11 LAB — LIPID PANEL
Cholesterol: 120 mg/dL — ABNORMAL LOW (ref 125–200)
HDL: 31 mg/dL — ABNORMAL LOW (ref >=40)
LDL CALC: 70 mg/dL (ref <130)
Total CHOL/HDL Ratio: 3.9 Ratio (ref <=5.0)
Triglycerides: 93 mg/dL (ref <150)
VLDL: 19 mg/dL (ref <30)

## 2015-10-11 LAB — CBC WITH DIFFERENTIAL/PLATELET
BASOS PCT: 0 % (ref 0–1)
Basophils Absolute: 0 10*3/uL (ref 0.0–0.1)
Eosinophils Absolute: 0.2 10*3/uL (ref 0.0–0.7)
Eosinophils Relative: 3 % (ref 0–5)
HEMATOCRIT: 46.1 % (ref 39.0–52.0)
HEMOGLOBIN: 15.2 g/dL (ref 13.0–17.0)
LYMPHS ABS: 1.7 10*3/uL (ref 0.7–4.0)
Lymphocytes Relative: 23 % (ref 12–46)
MCH: 29.6 pg (ref 26.0–34.0)
MCHC: 33 g/dL (ref 30.0–36.0)
MCV: 89.7 fL (ref 78.0–100.0)
MONO ABS: 0.8 10*3/uL (ref 0.1–1.0)
MONOS PCT: 11 % (ref 3–12)
MPV: 9.7 fL (ref 8.6–12.4)
NEUTROS ABS: 4.7 10*3/uL (ref 1.7–7.7)
NEUTROS PCT: 63 % (ref 43–77)
Platelets: 271 10*3/uL (ref 150–400)
RBC: 5.14 MIL/uL (ref 4.22–5.81)
RDW: 16.8 % — ABNORMAL HIGH (ref 11.5–15.5)
WBC: 7.4 10*3/uL (ref 4.0–10.5)

## 2015-10-11 LAB — PT WITH INR/FINGERSTICK
INR FINGERSTICK: 1.7 — AB (ref 0.80–1.20)
PT FINGERSTICK: 20.7 s — AB (ref 10.4–12.5)

## 2015-10-11 NOTE — Progress Notes (Signed)
Subjective:    Patient ID: William Alvarado, male    DOB: 10-Jun-1951, 65 y.o.   MRN: [id number]  HPI  06/24/15 Wt Readings from Last 3 Encounters:  08/19/15 296 lb (134.265 kg)  07/08/15 296 lb (134.265 kg)  07/01/15 313 lb (141.976 kg)  patient reports pain in both legs. Swelling in both legs have worsened. He now has +2 edema in both legs distal to the knee. Both legs are pink with chronic venous stasis changes. He also has a 3 cm diameter venous stasis ulcer on his left mid calf as well as a small 1 cm  Skin tear on his right mid calf. He is currently taking 2 mg of Bumex in the morning and 1 mg in the evening. He is also using Zaroxolyn once a week. He continues to smoke. Echocardiogram of the heart in 2014 revealed an ejection fraction of 55-60% but elevated pulmonic artery pressure. The patient has COPD. He continues to smoke. I suspect that he has right-sided heart failure secondary to this as well as possibly untreated sleep apnea but he refuses to get a sleep study.  At that time, my plan was: I believe the patient has bilateral leg edema secondary to a combination of venous insufficiency, and likely right-sided heart failure secondary to untreated sleep apnea, COPD, and smoking. Discontinue Bumex.. Begin Lasix 80 mg by mouth twice a day. Recheck in one week. If no better in 1 week, I would add schedule Zaroxolyn Monday Wednesday Friday and repeat an echocardiogram of the heart.  07/01/15 Here today for follow up.   Unfortunately, the patient has gained 8 pounds since last week. He also reports worsening dyspnea. He is not responding to Lasix. He denies any chest pain. He has tense pitting edema in both legs.  AT that time, my plan was:  scheduled patient for an echocardiogram to evaluate for congestive heart failure. Check renal function and potassium with a BMP. Resume Bumex 2 mg by mouth every morning and 1 mg by mouth daily at bedtime. Add Zaroxolyn 5 mg by mouth on Mondays Wednesdays  and Fridays. Recheck renal function in 1 week. Monitor for dehydration and hypokalemia. I encouraged smoking cessation again.  07/07/16 Here today for follow up.   Patient has lost 17 pounds since last week. This is the lowest his weight has been since August. The swelling in his legs is much better although he continues to have pain in his legs. He also has weeping venous stasis ulcers on the anterior shin which are small but cause him significant discomfort. He denies any cramps or dry mouth. He is also due for an INR check.  At that time, my plan was:  I will decrease Zaroxolyn to twice a week on Mondays and Thursdays to avoid dehydration and excessive diuresis. I would like to maintain his weight around 296 pounds. I believe if we can maintain the weight there, the venous stasis ulcers on his [name] will heal over the next 2-3 weeks as long as he is compliant with wearing his compression stockings. Recheck if no better in 2-3 weeks. I will also check a BMp today to monitor his renal function as well as his potassium. I will also check his INR. Recheck INR in 6 weeks  08/19/15 Patient's Coumadin today is supratherapeutic at 3.5. However he did admit that his diet changed substantially over Thanksgiving that may account for this. He states that he did not have any salads or greens over the  holiday. He is back up to smoking 1 pack of cigarettes a day. However his weight remains stable at 296 pounds. The edema in his legs is well controlled. He has been able to decrease his usage of Zaroxolyn to 1-2 days per week as needed.  Atthat time, my plan was:  believe the change in his INR is reflective of his change in diet. His previous INR has been well-controlled and stable. Therefore I'll have the patient temporarily discontinue Coumadin. I recommended he not take it for the next 48 hours and then resume his chronic dose. Recheck INR in 4 weeks. Continue Zaroxolyn at 1-2 days a week and Bumex every day. I  strongly encouraged smoking cessation. It is next office visit I would like him to come in fasting for fasting lipid panel as well as hemoglobin A1c.  10/11/15 Thankfully his echocardiogram revealed no evidence of congestive heart failure or right-sided heart failure. His weight remains constant. The edema in his legs as well controlled on his current diuretic regimen. Furthermore he is being very consistent wearing his compression stockings. He is here today for repeat INR but he is also here for diabetes check. He is overdue for fasting lipid panel and hemoglobin A1c. He denies any hypoglycemia. He denies any polyuria, polydipsia, or blurred vision. He denies any chest pain or shortness of breath. He has decreased his smoking from a pack a day to a half a pack a day. His blood pressure today however is very low at 88/48 Past Medical History  Diagnosis Date  . Hypertension   . Allergy     allergic rhinitis  . Systolic murmur   . Colon polyps   . Diabetes mellitus without complication (HCC)   . Degenerative disc disease, lumbar   . Atrial fibrillation (HCC)   . COPD (chronic obstructive pulmonary disease) (HCC)    Past Medical History  Diagnosis Date  . Hypertension   . Allergy     allergic rhinitis  . Systolic murmur   . Colon polyps   . Diabetes mellitus without complication (HCC)   . Degenerative disc disease, lumbar   . Atrial fibrillation (HCC)   . COPD (chronic obstructive pulmonary disease) (HCC)    Current Outpatient Prescriptions on File Prior to Visit  Medication Sig Dispense Refill  . bumetanide (BUMEX) 1 MG tablet TAKE 2 TABLETS BY MOUTH IN THE MORNING AND 1 AT NIGHT 90 tablet 5  . CARTIA XT 240 MG 24 hr capsule TAKE ONE CAPSULE BY MOUTH ONCE DAILY 30 capsule 11  . diltiazem (CARDIZEM CD) 240 MG 24 hr capsule TAKE ONE CAPSULE BY MOUTH ONCE DAILY 30 capsule 5  . fish oil-omega-3 fatty acids 1000 MG capsule Take 1 g by mouth daily.    Marland Kitchen glipiZIDE (GLIPIZIDE XL) 5 MG 24 hr  tablet Take 1 tablet (5 mg total) by mouth daily with breakfast. 90 tablet 3  . losartan (COZAAR) 100 MG tablet Take 1 tablet (100 mg total) by mouth daily. 90 tablet 3  . metFORMIN (GLUCOPHAGE) 1000 MG tablet TAKE ONE TABLET BY MOUTH TWICE DAILY 60 tablet 5  . metFORMIN (GLUCOPHAGE) 1000 MG tablet TAKE ONE TABLET BY MOUTH TWICE DAILY 60 tablet 3  . metolazone (ZAROXOLYN) 5 MG tablet Take 1 pill and wait 30 minutes and then take Bumex on M/Th.  DO NOT DO THIS EVERYDAY 30 tablet 0  . metoprolol (LOPRESSOR) 50 MG tablet Take 1 tablet (50 mg total) by mouth 2 (two) times daily. 60 tablet  11  . potassium chloride SA (KLOR-CON M20) 20 MEQ tablet Take 1 tablet (20 mEq total) by mouth daily. 30 tablet 5  . warfarin (COUMADIN) 5 MG tablet TAKE AS DIRECTED 90 tablet 5   No current facility-administered medications on file prior to visit.   No Known Allergies Social History   Social History  . Marital Status: Married    Spouse Name: N/A  . Number of Children: 2  . Years of Education: N/A   Occupational History  .      Machinist   Social History Main Topics  . Smoking status: Current Some Day Smoker -- 1.50 packs/day    Types: Cigarettes  . Smokeless tobacco: Never Used  . Alcohol Use: No  . Drug Use: No  . Sexual Activity: Not Currently   Other Topics Concern  . Not on file   Social History Narrative     Review of Systems  All other systems reviewed and are negative.      Objective:   Physical Exam  Neck: No JVD present.  Cardiovascular: Normal rate and normal heart sounds.   Pulmonary/Chest: Effort normal. He has wheezes.  Abdominal: Soft. Bowel sounds are normal.  Musculoskeletal: He exhibits edema.  Skin: There is erythema.  Vitals reviewed.         Assessment & Plan:  Atrial fibrillation, unspecified  HLD (hyperlipidemia)  Essential hypertension  Diabetes mellitus type II, non insulin dependent (HCC) - Plan: CBC with Differential/Platelet, COMPLETE  METABOLIC PANEL WITH GFR, Hemoglobin A1c, Lipid panel, Microalbumin, urine  Peripheral edema  I will check his INR. Decrease losartan 50 mg a day due to his low blood pressure. I'm afraid to discontinue or decrease metoprolol or diltiazem as they are regulating his heart rate. Furthermore he needs the fluid pills to control his significant peripheral edema. This leaves losartan as the only option to decrease. I will check a fasting lipid panel with a goal LDL less than 100. I will also check a hemoglobin A1c. The patient received his flu shot today. INR today is 1.7. However he takes 10 mg a day. He took 5 mg just today. Therefore I anticipate his INR will increase dramatically today. I recommended rechecking INR in 4 weeks and if still low, we will need to alter his Coumadin. He is currently on 10 mg Monday Wednesday Friday and 5 mg on the other days.

## 2015-10-12 LAB — HEMOGLOBIN A1C
HEMOGLOBIN A1C: 7.4 % — AB (ref <5.7)
MEAN PLASMA GLUCOSE: 166 mg/dL — AB (ref <117)

## 2015-10-30 ENCOUNTER — Encounter: Payer: Self-pay | Admitting: Physician Assistant

## 2015-10-30 ENCOUNTER — Ambulatory Visit (INDEPENDENT_AMBULATORY_CARE_PROVIDER_SITE_OTHER): Payer: BLUE CROSS/BLUE SHIELD | Admitting: Physician Assistant

## 2015-10-30 VITALS — BP 108/70 | HR 68 | Temp 98.2°F | Resp 18 | Wt 303.0 lb

## 2015-10-30 DIAGNOSIS — F17200 Nicotine dependence, unspecified, uncomplicated: Secondary | ICD-10-CM

## 2015-10-30 DIAGNOSIS — E119 Type 2 diabetes mellitus without complications: Secondary | ICD-10-CM | POA: Diagnosis not present

## 2015-10-30 DIAGNOSIS — Z720 Tobacco use: Secondary | ICD-10-CM | POA: Diagnosis not present

## 2015-10-30 DIAGNOSIS — J209 Acute bronchitis, unspecified: Secondary | ICD-10-CM

## 2015-10-30 MED ORDER — PREDNISONE 20 MG PO TABS: 20.0000 mg | ORAL_TABLET | Freq: Every day | ORAL | Status: DC

## 2015-10-30 MED ORDER — ALBUTEROL SULFATE HFA 108 (90 BASE) MCG/ACT IN AERS: 2.0000 | INHALATION_SPRAY | Freq: Four times a day (QID) | RESPIRATORY_TRACT | Status: DC | PRN

## 2015-10-30 MED ORDER — AZITHROMYCIN 250 MG PO TABS: ORAL_TABLET | ORAL | Status: DC

## 2015-10-30 NOTE — Progress Notes (Signed)
Patient ID: William Alvarado MRN: [id number], DOB: 08/18/1951, 65 y.o. Date of Encounter: 10/30/2015, 11:00 AM    Chief Complaint:  Chief Complaint  Patient presents with  . sick x 3 days    c/o bronchitis     HPI: 65 y.o. year old white male resents with above.  He says that he usually gets bronchitis about once a year so he figured that's what it was. Says that he does smoke and knows that he should quit.  Says that he has had very minimal runny nose very minimal congestion in the head and nose. Says that he is having cough productive of thick dark phlegm. Has had no fevers or chills.     Home Meds:   Outpatient Prescriptions Prior to Visit  Medication Sig Dispense Refill  . bumetanide (BUMEX) 1 MG tablet TAKE 2 TABLETS BY MOUTH IN THE MORNING AND 1 AT NIGHT 90 tablet 5  . diltiazem (CARDIZEM CD) 240 MG 24 hr capsule TAKE ONE CAPSULE BY MOUTH ONCE DAILY 30 capsule 5  . fish oil-omega-3 fatty acids 1000 MG capsule Take 1 g by mouth daily.    Marland Kitchen glipiZIDE (GLIPIZIDE XL) 5 MG 24 hr tablet Take 1 tablet (5 mg total) by mouth daily with breakfast. 90 tablet 3  . losartan (COZAAR) 100 MG tablet Take 1 tablet (100 mg total) by mouth daily. 90 tablet 3  . metFORMIN (GLUCOPHAGE) 1000 MG tablet TAKE ONE TABLET BY MOUTH TWICE DAILY 60 tablet 5  . metolazone (ZAROXOLYN) 5 MG tablet Take 1 pill and wait 30 minutes and then take Bumex on M/Th.  DO NOT DO THIS EVERYDAY 30 tablet 0  . metoprolol (LOPRESSOR) 50 MG tablet Take 1 tablet (50 mg total) by mouth 2 (two) times daily. 60 tablet 11  . potassium chloride SA (KLOR-CON M20) 20 MEQ tablet Take 1 tablet (20 mEq total) by mouth daily. 30 tablet 5  . warfarin (COUMADIN) 5 MG tablet TAKE AS DIRECTED (Patient taking differently: 2 tab po qd Tues, Thur, Sat, Sun. 1 tab po qd all other days) 90 tablet 5  . metFORMIN (GLUCOPHAGE) 1000 MG tablet TAKE ONE TABLET BY MOUTH TWICE DAILY 60 tablet 3   No facility-administered medications prior to  visit.    Allergies: No Known Allergies    Review of Systems: See HPI for pertinent ROS. All other ROS negative.    Physical Exam: Blood pressure 108/70, pulse 68, temperature 98.2 F (36.8 C), temperature source Oral, resp. rate 18, weight 303 lb (137.44 kg)., Body mass index is 39.98 kg/(m^2). General:  Obese white male. Appears in no acute distress. HEENT: Normocephalic, atraumatic, eyes without discharge, sclera non-icteric, nares are without discharge. Bilateral auditory canals clear, TM's are without perforation, pearly grey and translucent with reflective cone of light bilaterally. Oral cavity moist, posterior pharynx without exudate, erythema, peritonsillar abscess. No tenderness with percussion to frontal or maxillary sinuses bilaterally. Oxygen saturation 94% on room air.  Neck: Supple. No thyromegaly. No lymphadenopathy. Lungs: Clear bilaterally to auscultation without wheezes, rales, or rhonchi. Breathing is unlabored. Secondary to his body habitus/obesity breath sounds are slightly distant but sounds clear with no wheezing and good breath sounds and air movement. Heart: Regular rhythm. No murmurs, rubs, or gallops. Msk:  Strength and tone normal for age. Extremities/Skin: Warm and dry. Neuro: Alert and oriented X 3. Moves all extremities spontaneously. Gait is normal. CNII-XII grossly in tact. Psych:  Responds to questions appropriately with a normal affect.  ASSESSMENT AND PLAN:  65 y.o. year old male with  1. Acute bronchitis, unspecified organism He states that he does not have an albuterol inhaler at home and patient states that he cannot afford to buy 1. Scar said I hear no wheezing on exam at present time but that if he feels that he is having wheezing to add the prednisone. Patient states that at present time he plans to take the antibiotic only. Explained that I am sending and the prescriptions for the other medications if he feels that he needs them for wheezing  to add these. Follow up if symptoms worsen or do not resolve within 1 week after completion of antibiotic. - azithromycin (ZITHROMAX) 250 MG tablet; Day 1: Take 2 daily.  Days 2-5: Take 1 daily.  Dispense: 6 tablet; Refill: 0 - albuterol (PROVENTIL HFA;VENTOLIN HFA) 108 (90 Base) MCG/ACT inhaler; Inhale 2 puffs into the lungs every 6 (six) hours as needed for wheezing or shortness of breath.  Dispense: 1 Inhaler; Refill: 0 - predniSONE (DELTASONE) 20 MG tablet; Take 1 tablet (20 mg total) by mouth daily with breakfast.  Dispense: 5 tablet; Refill: 0  2. Smoker  3. Diabetes mellitus without complication [facility])    Signed, [address] [place], [place], [facility] 10/30/2015 11:00 AM

## 2015-11-08 ENCOUNTER — Ambulatory Visit (INDEPENDENT_AMBULATORY_CARE_PROVIDER_SITE_OTHER): Payer: BLUE CROSS/BLUE SHIELD | Admitting: Family Medicine

## 2015-11-08 ENCOUNTER — Encounter: Payer: Self-pay | Admitting: Family Medicine

## 2015-11-08 VITALS — BP 100/60 | HR 68 | Temp 98.2°F | Resp 18 | Ht 73.0 in | Wt 297.0 lb

## 2015-11-08 DIAGNOSIS — J208 Acute bronchitis due to other specified organisms: Secondary | ICD-10-CM

## 2015-11-08 DIAGNOSIS — E1100 Type 2 diabetes mellitus with hyperosmolarity without nonketotic hyperglycemic-hyperosmolar coma (NKHHC): Secondary | ICD-10-CM

## 2015-11-08 DIAGNOSIS — I4891 Unspecified atrial fibrillation: Secondary | ICD-10-CM

## 2015-11-08 LAB — PT WITH INR/FINGERSTICK
INR FINGERSTICK: 2.4 — AB (ref 0.80–1.20)
PT FINGERSTICK: 28.3 s — AB (ref 10.4–12.5)

## 2015-11-08 NOTE — Progress Notes (Signed)
Subjective:    Patient ID: William Alvarado, male    DOB: August 17, 1951, 65 y.o.   MRN: [id number]  HPI  06/24/15 Wt Readings from Last 3 Encounters:  11/08/15 297 lb (134.718 kg)  10/30/15 303 lb (137.44 kg)  10/11/15 294 lb (133.358 kg)  patient reports pain in both legs. Swelling in both legs have worsened. He now has +2 edema in both legs distal to the knee. Both legs are pink with chronic venous stasis changes. He also has a 3 cm diameter venous stasis ulcer on his left mid calf as well as a small 1 cm  Skin tear on his right mid calf. He is currently taking 2 mg of Bumex in the morning and 1 mg in the evening. He is also using Zaroxolyn once a week. He continues to smoke. Echocardiogram of the heart in 2014 revealed an ejection fraction of 55-60% but elevated pulmonic artery pressure. The patient has COPD. He continues to smoke. I suspect that he has right-sided heart failure secondary to this as well as possibly untreated sleep apnea but he refuses to get a sleep study.  At that time, my plan was: I believe the patient has bilateral leg edema secondary to a combination of venous insufficiency, and likely right-sided heart failure secondary to untreated sleep apnea, COPD, and smoking. Discontinue Bumex.. Begin Lasix 80 mg by mouth twice a day. Recheck in one week. If no better in 1 week, I would add schedule Zaroxolyn Monday Wednesday Friday and repeat an echocardiogram of the heart.  07/01/15 Here today for follow up.   Unfortunately, the patient has gained 8 pounds since last week. He also reports worsening dyspnea. He is not responding to Lasix. He denies any chest pain. He has tense pitting edema in both legs.  AT that time, my plan was:  scheduled patient for an echocardiogram to evaluate for congestive heart failure. Check renal function and potassium with a BMP. Resume Bumex 2 mg by mouth every morning and 1 mg by mouth daily at bedtime. Add Zaroxolyn 5 mg by mouth on Mondays Wednesdays  and Fridays. Recheck renal function in 1 week. Monitor for dehydration and hypokalemia. I encouraged smoking cessation again.  07/07/16 Here today for follow up.   Patient has lost 17 pounds since last week. This is the lowest his weight has been since August. The swelling in his legs is much better although he continues to have pain in his legs. He also has weeping venous stasis ulcers on the anterior shin which are small but cause him significant discomfort. He denies any cramps or dry mouth. He is also due for an INR check.  At that time, my plan was:  I will decrease Zaroxolyn to twice a week on Mondays and Thursdays to avoid dehydration and excessive diuresis. I would like to maintain his weight around 296 pounds. I believe if we can maintain the weight there, the venous stasis ulcers on his [name] will heal over the next 2-3 weeks as long as he is compliant with wearing his compression stockings. Recheck if no better in 2-3 weeks. I will also check a BMp today to monitor his renal function as well as his potassium. I will also check his INR. Recheck INR in 6 weeks  08/19/15 Patient's Coumadin today is supratherapeutic at 3.5. However he did admit that his diet changed substantially over Thanksgiving that may account for this. He states that he did not have any salads or greens over the  holiday. He is back up to smoking 1 pack of cigarettes a day. However his weight remains stable at 296 pounds. The edema in his legs is well controlled. He has been able to decrease his usage of Zaroxolyn to 1-2 days per week as needed.  Atthat time, my plan was:  believe the change in his INR is reflective of his change in diet. His previous INR has been well-controlled and stable. Therefore I'll have the patient temporarily discontinue Coumadin. I recommended he not take it for the next 48 hours and then resume his chronic dose. Recheck INR in 4 weeks. Continue Zaroxolyn at 1-2 days a week and Bumex every day. I  strongly encouraged smoking cessation. It is next office visit I would like him to come in fasting for fasting lipid panel as well as hemoglobin A1c.  10/11/15 Thankfully his echocardiogram revealed no evidence of congestive heart failure or right-sided heart failure. His weight remains constant. The edema in his legs as well controlled on his current diuretic regimen. Furthermore he is being very consistent wearing his compression stockings. He is here today for repeat INR but he is also here for diabetes check. He is overdue for fasting lipid panel and hemoglobin A1c. He denies any hypoglycemia. He denies any polyuria, polydipsia, or blurred vision. He denies any chest pain or shortness of breath. He has decreased his smoking from a pack a day to a half a pack a day. His blood pressure today however is very low at 88/48.  At that time, my plan was: I will check his INR. Decrease losartan 50 mg a day due to his low blood pressure. I'm afraid to discontinue or decrease metoprolol or diltiazem as they are regulating his heart rate. Furthermore he needs the fluid pills to control his significant peripheral edema. This leaves losartan as the only option to decrease. I will check a fasting lipid panel with a goal LDL less than 100. I will also check a hemoglobin A1c. The patient received his flu shot today. INR today is 1.7. However he takes 10 mg a day. He took 5 mg just today. Therefore I anticipate his INR will increase dramatically today. I recommended rechecking INR in 4 weeks and if still low, we will need to alter his Coumadin. He is currently on 10 mg Monday Wednesday Friday and 5 mg on the other days.  11/08/15 INR today is 2.4. However he never decrease losartan after his last office visit. Furthermore he is active in taking his Coumadin different than what he explained to me. His wife is with him today and she states that he is taking 10 mg on Tuesday, Thursday, Saturday, Sunday and taking 5 mg on  Monday Wednesday Friday. At his last office visit I also check a hemoglobin A1c and found to be elevated at 7.4. I recommended increasing his glipizide from 5-10 mg a day. However his wife states today that he is not even taking glipizide and he has not been taking that for some time.. The wife prepares his medication and the patient is confused as to what he is taking Past Medical History  Diagnosis Date  . Hypertension   . Allergy     allergic rhinitis  . Systolic murmur   . Colon polyps   . Diabetes mellitus without complication (HCC)   . Degenerative disc disease, lumbar   . Atrial fibrillation (HCC)   . COPD (chronic obstructive pulmonary disease) (HCC)    Past Medical History  Diagnosis  Date  . Hypertension   . Allergy     allergic rhinitis  . Systolic murmur   . Colon polyps   . Diabetes mellitus without complication (HCC)   . Degenerative disc disease, lumbar   . Atrial fibrillation (HCC)   . COPD (chronic obstructive pulmonary disease) (HCC)    Current Outpatient Prescriptions on File Prior to Visit  Medication Sig Dispense Refill  . albuterol (PROVENTIL HFA;VENTOLIN HFA) 108 (90 Base) MCG/ACT inhaler Inhale 2 puffs into the lungs every 6 (six) hours as needed for wheezing or shortness of breath. 1 Inhaler 0  . bumetanide (BUMEX) 1 MG tablet TAKE 2 TABLETS BY MOUTH IN THE MORNING AND 1 AT NIGHT 90 tablet 5  . diltiazem (CARDIZEM CD) 240 MG 24 hr capsule TAKE ONE CAPSULE BY MOUTH ONCE DAILY 30 capsule 5  . fish oil-omega-3 fatty acids 1000 MG capsule Take 1 g by mouth daily.    Marland Kitchen losartan (COZAAR) 100 MG tablet Take 1 tablet (100 mg total) by mouth daily. 90 tablet 3  . metFORMIN (GLUCOPHAGE) 1000 MG tablet TAKE ONE TABLET BY MOUTH TWICE DAILY 60 tablet 5  . metolazone (ZAROXOLYN) 5 MG tablet Take 1 pill and wait 30 minutes and then take Bumex on M/Th.  DO NOT DO THIS EVERYDAY 30 tablet 0  . metoprolol (LOPRESSOR) 50 MG tablet Take 1 tablet (50 mg total) by mouth 2 (two)  times daily. 60 tablet 11  . potassium chloride SA (KLOR-CON M20) 20 MEQ tablet Take 1 tablet (20 mEq total) by mouth daily. 30 tablet 5  . predniSONE (DELTASONE) 20 MG tablet Take 1 tablet (20 mg total) by mouth daily with breakfast. 5 tablet 0  . warfarin (COUMADIN) 5 MG tablet TAKE AS DIRECTED (Patient taking differently: 2 tab po qd Tues, Thur, Sat, Sun. 1 tab po qd all other days) 90 tablet 5   No current facility-administered medications on file prior to visit.   No Known Allergies Social History   Social History  . Marital Status: Married    Spouse Name: N/A  . Number of Children: 2  . Years of Education: N/A   Occupational History  .      Machinist   Social History Main Topics  . Smoking status: Current Some Day Smoker -- 1.50 packs/day    Types: Cigarettes  . Smokeless tobacco: Never Used  . Alcohol Use: No  . Drug Use: No  . Sexual Activity: Not Currently   Other Topics Concern  . Not on file   Social History Narrative     Review of Systems  All other systems reviewed and are negative.      Objective:   Physical Exam  Neck: No JVD present.  Cardiovascular: Normal rate and normal heart sounds.   Pulmonary/Chest: Effort normal. He has wheezes.  Abdominal: Soft. Bowel sounds are normal.  Musculoskeletal: He exhibits edema.  Skin: There is erythema.  Vitals reviewed.         Assessment & Plan:  Acute bronchitis due to other specified organisms - Plan: DG Chest 2 View  Atrial fibrillation, unspecified - Plan: PT with INR/Fingerstick  Uncontrolled type 2 diabetes mellitus with hyperosmolarity without coma, without long-term current use of insulin (HCC)  Patient saw my PA for bronchitis. On exam his lungs are clear. However I've recommended a chest x-ray given the fact he continues to have a persistent cough. His Coumadin is therapeutic and therefore I'll make no changes in his Coumadin dose. Continue 10  mg on Tuesday Thursday and Saturday and Sunday  and 5 mg on Monday Wednesday Friday. Patient does not want to resume glipizide. He would like to try to change his diet and discontinue soft drinks and recheck his blood sugar/fasting lab work in May

## 2015-11-20 ENCOUNTER — Other Ambulatory Visit: Payer: Self-pay | Admitting: Family Medicine

## 2015-11-21 NOTE — Telephone Encounter (Signed)
Refill appropriate and filled per protocol. 

## 2015-12-16 ENCOUNTER — Telehealth: Payer: Self-pay | Admitting: Family Medicine

## 2015-12-16 DIAGNOSIS — R609 Edema, unspecified: Secondary | ICD-10-CM

## 2015-12-16 MED ORDER — METOLAZONE 5 MG PO TABS: ORAL_TABLET | ORAL | Status: DC

## 2015-12-16 NOTE — Telephone Encounter (Signed)
Medication called/sent to requested pharmacy  

## 2015-12-16 NOTE — Telephone Encounter (Signed)
Patient calling to get what wife is calling a "primer"? For fluid output  If any questions call [phone]  [facility]

## 2015-12-16 NOTE — Telephone Encounter (Signed)
He needs a refill on his Zaroxolyn

## 2015-12-20 ENCOUNTER — Ambulatory Visit (INDEPENDENT_AMBULATORY_CARE_PROVIDER_SITE_OTHER): Payer: BLUE CROSS/BLUE SHIELD | Admitting: Family Medicine

## 2015-12-20 ENCOUNTER — Encounter: Payer: Self-pay | Admitting: Family Medicine

## 2015-12-20 VITALS — BP 110/60 | HR 68 | Temp 97.8°F | Resp 18 | Ht 73.0 in | Wt 294.0 lb

## 2015-12-20 DIAGNOSIS — I10 Essential (primary) hypertension: Secondary | ICD-10-CM | POA: Diagnosis not present

## 2015-12-20 DIAGNOSIS — I480 Paroxysmal atrial fibrillation: Secondary | ICD-10-CM | POA: Diagnosis not present

## 2015-12-20 DIAGNOSIS — I4891 Unspecified atrial fibrillation: Secondary | ICD-10-CM

## 2015-12-20 DIAGNOSIS — E1100 Type 2 diabetes mellitus with hyperosmolarity without nonketotic hyperglycemic-hyperosmolar coma (NKHHC): Secondary | ICD-10-CM | POA: Diagnosis not present

## 2015-12-20 DIAGNOSIS — E785 Hyperlipidemia, unspecified: Secondary | ICD-10-CM

## 2015-12-20 LAB — COMPLETE METABOLIC PANEL WITH GFR
ALBUMIN: 4 g/dL (ref 3.6–5.1)
ALT: 19 U/L (ref 9–46)
AST: 15 U/L (ref 10–35)
Alkaline Phosphatase: 68 U/L (ref 40–115)
BUN: 18 mg/dL (ref 7–25)
CALCIUM: 9.3 mg/dL (ref 8.6–10.3)
CO2: 30 mmol/L (ref 20–31)
Chloride: 95 mmol/L — ABNORMAL LOW (ref 98–110)
Creat: 0.9 mg/dL (ref 0.70–1.25)
GFR, Est African American: >89 mL/min (ref >=60)
GFR, Est Non African American: >89 mL/min (ref >=60)
GLUCOSE: 93 mg/dL (ref 70–99)
Potassium: 3.9 mmol/L (ref 3.5–5.3)
SODIUM: 137 mmol/L (ref 135–146)
TOTAL PROTEIN: 6.9 g/dL (ref 6.1–8.1)
Total Bilirubin: 0.7 mg/dL (ref 0.2–1.2)

## 2015-12-20 LAB — CBC WITH DIFFERENTIAL/PLATELET
BASOS PCT: 1 % (ref 0–1)
Basophils Absolute: 0.1 10*3/uL (ref 0.0–0.1)
Eosinophils Absolute: 0.2 10*3/uL (ref 0.0–0.7)
Eosinophils Relative: 2 % (ref 0–5)
HEMATOCRIT: 47.1 % (ref 39.0–52.0)
HEMOGLOBIN: 15.9 g/dL (ref 13.0–17.0)
LYMPHS PCT: 21 % (ref 12–46)
Lymphs Abs: 1.6 10*3/uL (ref 0.7–4.0)
MCH: 31.2 pg (ref 26.0–34.0)
MCHC: 33.8 g/dL (ref 30.0–36.0)
MCV: 92.4 fL (ref 78.0–100.0)
MONOS PCT: 11 % (ref 3–12)
MPV: 9.7 fL (ref 8.6–12.4)
Monocytes Absolute: 0.8 10*3/uL (ref 0.1–1.0)
NEUTROS ABS: 4.9 10*3/uL (ref 1.7–7.7)
NEUTROS PCT: 65 % (ref 43–77)
Platelets: 287 10*3/uL (ref 150–400)
RBC: 5.1 MIL/uL (ref 4.22–5.81)
RDW: 15.4 % (ref 11.5–15.5)
WBC: 7.5 10*3/uL (ref 4.0–10.5)

## 2015-12-20 LAB — LIPID PANEL
CHOL/HDL RATIO: 4 ratio (ref <=5.0)
CHOLESTEROL: 115 mg/dL — AB (ref 125–200)
HDL: 29 mg/dL — ABNORMAL LOW (ref >=40)
LDL Cholesterol: 68 mg/dL (ref <130)
TRIGLYCERIDES: 89 mg/dL (ref <150)
VLDL: 18 mg/dL (ref <30)

## 2015-12-20 LAB — HEMOGLOBIN A1C
Hgb A1c MFr Bld: 6.7 % — ABNORMAL HIGH (ref <5.7)
Mean Plasma Glucose: 146 mg/dL

## 2015-12-20 LAB — PT WITH INR/FINGERSTICK
INR, fingerstick: 2.6 — ABNORMAL HIGH (ref 0.80–1.20)
PT, fingerstick: 30.6 seconds — ABNORMAL HIGH (ref 10.4–12.5)

## 2015-12-20 NOTE — Progress Notes (Signed)
Subjective:    Patient ID: William Alvarado, male    DOB: Jan 12, 1951, 65 y.o.   MRN: [id number]  HPI  Patient is here today to recheck his Coumadin. His INR is therapeutic at 2.6. Therefore we will make no changes in his Coumadin dose and recheck in 6 weeks. His blood pressure today is well controlled at 110/60. He is trying to work on reducing his cigarette use. His lungs sound clear today and there is no wheezes. He is in atrial fibrillation today although rate is controlled. He is able to maintain his fluid status using Zaroxolyn Mondays and Fridays. There is no pitting edema in his legs today. He denies any neuropathy. He is due to recheck his blood sugar tests/hemoglobin A1c along with his cholesterol today Past Medical History  Diagnosis Date  . Hypertension   . Allergy     allergic rhinitis  . Systolic murmur   . Colon polyps   . Diabetes mellitus without complication (HCC)   . Degenerative disc disease, lumbar   . Atrial fibrillation (HCC)   . COPD (chronic obstructive pulmonary disease) (HCC)    Past Medical History  Diagnosis Date  . Hypertension   . Allergy     allergic rhinitis  . Systolic murmur   . Colon polyps   . Diabetes mellitus without complication (HCC)   . Degenerative disc disease, lumbar   . Atrial fibrillation (HCC)   . COPD (chronic obstructive pulmonary disease) (HCC)    Current Outpatient Prescriptions on File Prior to Visit  Medication Sig Dispense Refill  . albuterol (PROVENTIL HFA;VENTOLIN HFA) 108 (90 Base) MCG/ACT inhaler Inhale 2 puffs into the lungs every 6 (six) hours as needed for wheezing or shortness of breath. 1 Inhaler 0  . bumetanide (BUMEX) 1 MG tablet TAKE 2 TABLETS BY MOUTH IN THE MORNING AND 1 AT NIGHT 90 tablet 5  . diltiazem (CARDIZEM CD) 240 MG 24 hr capsule TAKE ONE CAPSULE BY MOUTH ONCE DAILY 30 capsule 5  . fish oil-omega-3 fatty acids 1000 MG capsule Take 1 g by mouth daily.    Marland Kitchen KLOR-CON M20 20 MEQ tablet TAKE ONE TABLET BY  MOUTH ONCE DAILY 30 tablet 3  . losartan (COZAAR) 100 MG tablet Take 1 tablet (100 mg total) by mouth daily. 90 tablet 3  . metFORMIN (GLUCOPHAGE) 1000 MG tablet TAKE ONE TABLET BY MOUTH TWICE DAILY 60 tablet 5  . metolazone (ZAROXOLYN) 5 MG tablet Take 1 pill and wait 30 minutes and then take Bumex on M/Th.  DO NOT DO THIS EVERYDAY 30 tablet 0  . metoprolol (LOPRESSOR) 50 MG tablet Take 1 tablet (50 mg total) by mouth 2 (two) times daily. 60 tablet 11  . predniSONE (DELTASONE) 20 MG tablet Take 1 tablet (20 mg total) by mouth daily with breakfast. 5 tablet 0  . warfarin (COUMADIN) 5 MG tablet TAKE AS DIRECTED (Patient taking differently: 2 tab po qd Tues, Thur, Sat, Sun. 1 tab po qd all other days) 90 tablet 5   No current facility-administered medications on file prior to visit.   No Known Allergies Social History   Social History  . Marital Status: Married    Spouse Name: N/A  . Number of Children: 2  . Years of Education: N/A   Occupational History  .      Machinist   Social History Main Topics  . Smoking status: Current Some Day Smoker -- 1.50 packs/day    Types: Cigarettes  . Smokeless tobacco:  Never Used  . Alcohol Use: No  . Drug Use: No  . Sexual Activity: Not Currently   Other Topics Concern  . Not on file   Social History Narrative     Review of Systems  All other systems reviewed and are negative.      Objective:   Physical Exam  Neck: No JVD present.  Cardiovascular: Normal rate and normal heart sounds.   Pulmonary/Chest: Effort normal. He has no wheezes. He has no rales.  Abdominal: Soft. Bowel sounds are normal. He exhibits no distension. There is no tenderness. There is no rebound and no guarding.  Musculoskeletal: He exhibits no edema.  Lymphadenopathy:    He has no cervical adenopathy.  Skin: No erythema.  Vitals reviewed.         Assessment & Plan:  Uncontrolled type 2 diabetes mellitus with hyperosmolarity without coma, without  long-term current use of insulin (HCC) - Plan: CBC with Differential/Platelet, COMPLETE METABOLIC PANEL WITH GFR, Hemoglobin A1c, Lipid panel  Paroxysmal atrial fibrillation (HCC) - Plan: PT with INR/Fingerstick  Atrial fibrillation, unspecified - Plan: PT with INR/Fingerstick  Essential hypertension  HLD (hyperlipidemia)  Coumadin is therapeutic. I will make no changes in his Coumadin dose and I'll recheck in 6 weeks. His blood pressure is excellent. I will make no changes in his blood pressure medication. I continue to encourage the patient to quit smoking. His fluid status and weight are stable. I will make no changes in his diuretic. Continue Zaroxolyn twice a week. I will check a fasting lipid panel. Goal LDL cholesterol is less than 100. I will also check a hemoglobin A1c. Goal hemoglobin A1c is less than 7.0

## 2016-01-11 ENCOUNTER — Other Ambulatory Visit: Payer: Self-pay | Admitting: Family Medicine

## 2016-01-24 ENCOUNTER — Other Ambulatory Visit: Payer: BLUE CROSS/BLUE SHIELD

## 2016-01-24 ENCOUNTER — Other Ambulatory Visit: Payer: Self-pay | Admitting: Family Medicine

## 2016-01-24 NOTE — Telephone Encounter (Signed)
Refill appropriate and filled per protocol. 

## 2016-02-01 ENCOUNTER — Encounter: Payer: Self-pay | Admitting: Family Medicine

## 2016-02-27 ENCOUNTER — Other Ambulatory Visit: Payer: Self-pay | Admitting: Family Medicine

## 2016-02-27 NOTE — Telephone Encounter (Signed)
Refill appropriate and filled per protocol. 

## 2016-03-16 ENCOUNTER — Other Ambulatory Visit: Payer: Self-pay | Admitting: Family Medicine

## 2016-03-20 ENCOUNTER — Ambulatory Visit: Payer: BLUE CROSS/BLUE SHIELD | Admitting: Family Medicine

## 2016-03-26 ENCOUNTER — Other Ambulatory Visit: Payer: Self-pay | Admitting: Family Medicine

## 2016-03-27 ENCOUNTER — Encounter: Payer: Self-pay | Admitting: Family Medicine

## 2016-03-27 ENCOUNTER — Ambulatory Visit (INDEPENDENT_AMBULATORY_CARE_PROVIDER_SITE_OTHER): Payer: BLUE CROSS/BLUE SHIELD | Admitting: Family Medicine

## 2016-03-27 VITALS — BP 100/60 | HR 88 | Temp 98.6°F | Resp 22 | Ht 73.0 in | Wt 296.0 lb

## 2016-03-27 DIAGNOSIS — E119 Type 2 diabetes mellitus without complications: Secondary | ICD-10-CM

## 2016-03-27 DIAGNOSIS — I4891 Unspecified atrial fibrillation: Secondary | ICD-10-CM | POA: Diagnosis not present

## 2016-03-27 LAB — PT WITH INR/FINGERSTICK
INR, fingerstick: 2.3 — ABNORMAL HIGH (ref 0.80–1.20)
PT, fingerstick: 28.1 seconds — ABNORMAL HIGH (ref 10.4–12.5)

## 2016-03-27 LAB — HEMOGLOBIN A1C
HEMOGLOBIN A1C: 7.3 % — AB (ref <5.7)
Mean Plasma Glucose: 163 mg/dL

## 2016-03-27 MED ORDER — METOLAZONE 5 MG PO TABS: ORAL_TABLET | ORAL | Status: DC

## 2016-03-27 NOTE — Progress Notes (Signed)
Subjective:    Patient ID: William Alvarado, male    DOB: Nov 07, 1950, 65 y.o.   MRN: [id number]  HPI 12/20/15 Patient is here today to recheck his Coumadin. His INR is therapeutic at 2.6. Therefore we will make no changes in his Coumadin dose and recheck in 6 weeks. His blood pressure today is well controlled at 110/60. He is trying to work on reducing his cigarette use. His lungs sound clear today and there is no wheezes. He is in atrial fibrillation today although rate is controlled. He is able to maintain his fluid status using Zaroxolyn Mondays and Fridays. There is no pitting edema in his legs today. He denies any neuropathy. He is due to recheck his blood sugar tests/hemoglobin A1c along with his cholesterol today.  At that time, my plan was: Coumadin is therapeutic. I will make no changes in his Coumadin dose and I'll recheck in 6 weeks. His blood pressure is excellent. I will make no changes in his blood pressure medication. I continue to encourage the patient to quit smoking. His fluid status and weight are stable. I will make no changes in his diuretic. Continue Zaroxolyn twice a week. I will check a fasting lipid panel. Goal LDL cholesterol is less than 100. I will also check a hemoglobin A1c. Goal hemoglobin A1c is less than 7.0  03/27/16 He is here today to recheck his Coumadin. His INR is therapeutic at 2.3. However he is also due to recheck his fasting lab work. In March, his hemoglobin A1c was below 7. His blood pressure today is well controlled at 100/60. He denies any chest pain. He does have some shortness of breath with this related to his emphysema and his continued smoking which I have strongly encouraged him stop. His wife also states that he is extremely sleepy on a daily basis.  On several occasions I recommended a sleep study to evaluate for obstructive sleep apnea. I reiterated this today as I am convinced the patient has this condition. Past Medical History  Diagnosis Date  .  Hypertension   . Allergy     allergic rhinitis  . Systolic murmur   . Colon polyps   . Diabetes mellitus without complication (HCC)   . Degenerative disc disease, lumbar   . Atrial fibrillation (HCC)   . COPD (chronic obstructive pulmonary disease) (HCC)    Past Medical History  Diagnosis Date  . Hypertension   . Allergy     allergic rhinitis  . Systolic murmur   . Colon polyps   . Diabetes mellitus without complication (HCC)   . Degenerative disc disease, lumbar   . Atrial fibrillation (HCC)   . COPD (chronic obstructive pulmonary disease) (HCC)    Current Outpatient Prescriptions on File Prior to Visit  Medication Sig Dispense Refill  . albuterol (PROVENTIL HFA;VENTOLIN HFA) 108 (90 Base) MCG/ACT inhaler Inhale 2 puffs into the lungs every 6 (six) hours as needed for wheezing or shortness of breath. 1 Inhaler 0  . bumetanide (BUMEX) 1 MG tablet TAKE 2 TABLETS BY MOUTH IN THE MORNING AND 1 AT NIGHT 90 tablet 5  . CARTIA XT 240 MG 24 hr capsule TAKE ONE CAPSULE BY MOUTH ONCE DAILY 30 capsule 11  . diltiazem (CARDIZEM CD) 240 MG 24 hr capsule TAKE ONE CAPSULE BY MOUTH ONCE DAILY 30 capsule 5  . fish oil-omega-3 fatty acids 1000 MG capsule Take 1 g by mouth daily.    Marland Kitchen KLOR-CON M20 20 MEQ tablet TAKE ONE  TABLET BY MOUTH ONCE DAILY 30 tablet 11  . losartan (COZAAR) 100 MG tablet Take 1 tablet (100 mg total) by mouth daily. 90 tablet 3  . metFORMIN (GLUCOPHAGE) 1000 MG tablet TAKE ONE TABLET BY MOUTH TWICE DAILY 60 tablet 0  . metolazone (ZAROXOLYN) 5 MG tablet TAKE 1 PILL AND WAIT 30 MINUTES AND THEN TAKE BUMEX ON M/TH. DO NOT DO THIS EVERYDAY 30 tablet 5  . metoprolol (LOPRESSOR) 50 MG tablet Take 1 tablet (50 mg total) by mouth 2 (two) times daily. 60 tablet 11  . predniSONE (DELTASONE) 20 MG tablet Take 1 tablet (20 mg total) by mouth daily with breakfast. 5 tablet 0  . warfarin (COUMADIN) 5 MG tablet TAKE AS DIRECTED (Patient taking differently: 2 tab po qd Tues, Thur, Sat, Sun. 1  tab po qd all other days) 90 tablet 5   No current facility-administered medications on file prior to visit.   No Known Allergies Social History   Social History  . Marital Status: Married    Spouse Name: N/A  . Number of Children: 2  . Years of Education: N/A   Occupational History  .      Machinist   Social History Main Topics  . Smoking status: Current Some Day Smoker -- 1.50 packs/day    Types: Cigarettes  . Smokeless tobacco: Never Used  . Alcohol Use: No  . Drug Use: No  . Sexual Activity: Not Currently   Other Topics Concern  . Not on file   Social History Narrative     Review of Systems  All other systems reviewed and are negative.      Objective:   Physical Exam  Neck: No JVD present.  Cardiovascular: Normal rate and normal heart sounds.   Pulmonary/Chest: Effort normal. He has no wheezes. He has no rales.  Abdominal: Soft. Bowel sounds are normal. He exhibits no distension. There is no tenderness. There is no rebound and no guarding.  Musculoskeletal: He exhibits no edema.  Lymphadenopathy:    He has no cervical adenopathy.  Skin: No erythema.  Vitals reviewed.         Assessment & Plan:  Controlled type 2 diabetes mellitus without complication, without long-term current use of insulin (HCC) - Plan: COMPLETE METABOLIC PANEL WITH GFR, Lipid panel, Microalbumin, urine, Hemoglobin A1c  Atrial fibrillation, unspecified - Plan: PT with INR/Fingerstick  INR is therapeutic. Make no changes and recheck in 6 weeks. Check hemoglobin A1c. Goal hemoglobin A1c is less than 7.0. His blood pressures at goal today. His weight is stable. The swelling in his legs is well managed with his diuretic dose and his occasional use of Zaroxolyn. Check a fasting lipid panel. His goal LDL cholesterol is less than 100. I continue to recommend a sleep study to evaluate for obstructive sleep apnea as a potential cause of his hypersomnolence. They decline at the present time

## 2016-03-28 LAB — COMPLETE METABOLIC PANEL WITH GFR
ALBUMIN: 3.9 g/dL (ref 3.6–5.1)
ALK PHOS: 61 U/L (ref 40–115)
ALT: 19 U/L (ref 9–46)
AST: 16 U/L (ref 10–35)
BILIRUBIN TOTAL: 0.8 mg/dL (ref 0.2–1.2)
BUN: 26 mg/dL — ABNORMAL HIGH (ref 7–25)
CALCIUM: 8.9 mg/dL (ref 8.6–10.3)
CO2: 30 mmol/L (ref 20–31)
Chloride: 94 mmol/L — ABNORMAL LOW (ref 98–110)
Creat: 1.11 mg/dL (ref 0.70–1.25)
GFR, EST AFRICAN AMERICAN: 82 mL/min (ref >=60)
GFR, EST NON AFRICAN AMERICAN: 71 mL/min (ref >=60)
Glucose, Bld: 104 mg/dL — ABNORMAL HIGH (ref 70–99)
POTASSIUM: 3.8 mmol/L (ref 3.5–5.3)
SODIUM: 136 mmol/L (ref 135–146)
TOTAL PROTEIN: 7.2 g/dL (ref 6.1–8.1)

## 2016-03-28 LAB — LIPID PANEL
CHOL/HDL RATIO: 4 ratio (ref <=5.0)
CHOLESTEROL: 133 mg/dL (ref 125–200)
HDL: 33 mg/dL — AB (ref >=40)
LDL Cholesterol: 79 mg/dL (ref <130)
TRIGLYCERIDES: 105 mg/dL (ref <150)
VLDL: 21 mg/dL (ref <30)

## 2016-03-28 LAB — MICROALBUMIN, URINE: MICROALB UR: 8 mg/dL

## 2016-03-29 ENCOUNTER — Other Ambulatory Visit: Payer: Self-pay | Admitting: Family Medicine

## 2016-04-15 ENCOUNTER — Other Ambulatory Visit: Payer: Self-pay | Admitting: Family Medicine

## 2016-05-17 ENCOUNTER — Other Ambulatory Visit: Payer: Self-pay | Admitting: Family Medicine

## 2016-06-30 ENCOUNTER — Other Ambulatory Visit: Payer: Self-pay | Admitting: Family Medicine

## 2016-07-06 ENCOUNTER — Ambulatory Visit: Payer: BLUE CROSS/BLUE SHIELD | Admitting: Family Medicine

## 2016-07-10 ENCOUNTER — Ambulatory Visit (INDEPENDENT_AMBULATORY_CARE_PROVIDER_SITE_OTHER): Payer: BLUE CROSS/BLUE SHIELD | Admitting: Family Medicine

## 2016-07-10 ENCOUNTER — Encounter: Payer: Self-pay | Admitting: Family Medicine

## 2016-07-10 VITALS — BP 112/80 | HR 80 | Temp 98.2°F | Resp 20 | Ht 73.0 in | Wt 307.0 lb

## 2016-07-10 DIAGNOSIS — I481 Persistent atrial fibrillation: Secondary | ICD-10-CM

## 2016-07-10 DIAGNOSIS — E0841 Diabetes mellitus due to underlying condition with diabetic mononeuropathy: Secondary | ICD-10-CM

## 2016-07-10 DIAGNOSIS — I4819 Other persistent atrial fibrillation: Secondary | ICD-10-CM

## 2016-07-10 DIAGNOSIS — E119 Type 2 diabetes mellitus without complications: Secondary | ICD-10-CM | POA: Diagnosis not present

## 2016-07-10 LAB — COMPLETE METABOLIC PANEL WITH GFR
ALBUMIN: 3.8 g/dL (ref 3.6–5.1)
ALK PHOS: 81 U/L (ref 40–115)
ALT: 13 U/L (ref 9–46)
AST: 15 U/L (ref 10–35)
BILIRUBIN TOTAL: 0.5 mg/dL (ref 0.2–1.2)
BUN: 17 mg/dL (ref 7–25)
CALCIUM: 8.9 mg/dL (ref 8.6–10.3)
CO2: 35 mmol/L — ABNORMAL HIGH (ref 20–31)
Chloride: 94 mmol/L — ABNORMAL LOW (ref 98–110)
Creat: 1.15 mg/dL (ref 0.70–1.25)
GFR, EST AFRICAN AMERICAN: 78 mL/min (ref >=60)
GFR, EST NON AFRICAN AMERICAN: 67 mL/min (ref >=60)
Glucose, Bld: 112 mg/dL — ABNORMAL HIGH (ref 70–99)
POTASSIUM: 3.4 mmol/L — AB (ref 3.5–5.3)
Sodium: 139 mmol/L (ref 135–146)
TOTAL PROTEIN: 6.9 g/dL (ref 6.1–8.1)

## 2016-07-10 LAB — CBC WITH DIFFERENTIAL/PLATELET
BASOS ABS: 0 {cells}/uL (ref 0–200)
Basophils Relative: 0 %
EOS ABS: 140 {cells}/uL (ref 15–500)
EOS PCT: 2 %
HEMATOCRIT: 45.2 % (ref 38.5–50.0)
HEMOGLOBIN: 14.7 g/dL (ref 13.0–17.0)
LYMPHS ABS: 1330 {cells}/uL (ref 850–3900)
Lymphocytes Relative: 19 %
MCH: 29.3 pg (ref 27.0–33.0)
MCHC: 32.5 g/dL (ref 32.0–36.0)
MCV: 90.2 fL (ref 80.0–100.0)
MONO ABS: 770 {cells}/uL (ref 200–950)
MPV: 9.8 fL (ref 7.5–12.5)
Monocytes Relative: 11 %
NEUTROS PCT: 68 %
Neutro Abs: 4760 cells/uL (ref 1500–7800)
Platelets: 278 10*3/uL (ref 140–400)
RBC: 5.01 MIL/uL (ref 4.20–5.80)
RDW: 16.2 % — ABNORMAL HIGH (ref 11.0–15.0)
WBC: 7 10*3/uL (ref 3.8–10.8)

## 2016-07-10 LAB — HEMOGLOBIN A1C
HEMOGLOBIN A1C: 6.9 % — AB (ref <5.7)
Mean Plasma Glucose: 151 mg/dL

## 2016-07-10 LAB — LIPID PANEL
CHOLESTEROL: 97 mg/dL — AB (ref 125–200)
HDL: 26 mg/dL — ABNORMAL LOW (ref >=40)
LDL Cholesterol: 52 mg/dL (ref <130)
TRIGLYCERIDES: 94 mg/dL (ref <150)
Total CHOL/HDL Ratio: 3.7 Ratio (ref <=5.0)
VLDL: 19 mg/dL (ref <30)

## 2016-07-10 LAB — PT WITH INR/FINGERSTICK
INR FINGERSTICK: 3.1 — AB (ref 0.80–1.20)
PT, fingerstick: 37.2 seconds — ABNORMAL HIGH (ref 10.4–12.5)

## 2016-07-10 MED ORDER — GABAPENTIN 300 MG PO CAPS: 300.0000 mg | ORAL_CAPSULE | Freq: Every day | ORAL | 3 refills | Status: DC

## 2016-07-10 NOTE — Progress Notes (Signed)
Subjective:    Patient ID: William Alvarado, male    DOB: [birth year], 65 y.o.   MRN: [id number]  HPI3/31/17 Patient is here today to recheck his Coumadin. His INR is therapeutic at 2.6. Therefore we will make no changes in his Coumadin dose and recheck in 6 weeks. His blood pressure today is well controlled at 110/60. He is trying to work on reducing his cigarette use. His lungs sound clear today and there is no wheezes. He is in atrial fibrillation today although rate is controlled. He is able to maintain his fluid status using Zaroxolyn Mondays and Fridays. There is no pitting edema in his legs today. He denies any neuropathy. He is due to recheck his blood sugar tests/hemoglobin A1c along with his cholesterol today.  At that time, my plan was: Coumadin is therapeutic. I will make no changes in his Coumadin dose and I'll recheck in 6 weeks. His blood pressure is excellent. I will make no changes in his blood pressure medication. I continue to encourage the patient to quit smoking. His fluid status and weight are stable. I will make no changes in his diuretic. Continue Zaroxolyn twice a week. I will check a fasting lipid panel. Goal LDL cholesterol is less than 100. I will also check a hemoglobin A1c. Goal hemoglobin A1c is less than 7.0  03/27/16 He is here today to recheck his Coumadin. His INR is therapeutic at 2.3. However he is also due to recheck his fasting lab work. In March, his hemoglobin A1c was below 7. His blood pressure today is well controlled at 100/60. He denies any chest pain. He does have some shortness of breath with this related to his emphysema and his continued smoking which I have strongly encouraged him stop. His wife also states that he is extremely sleepy on a daily basis.  On several occasions I recommended a sleep study to evaluate for obstructive sleep apnea. I reiterated this today as I am convinced the patient has this condition.  At that time, my plan was: INR is  therapeutic. Make no changes and recheck in 6 weeks. Check hemoglobin A1c. Goal hemoglobin A1c is less than 7.0. His blood pressures at goal today. His weight is stable. The swelling in his legs is well managed with his diuretic dose and his occasional use of Zaroxolyn. Check a fasting lipid panel. His goal LDL cholesterol is less than 100. I continue to recommend a sleep study to evaluate for obstructive sleep apnea as a potential cause of his hypersomnolence. They decline at the present time  07/10/16 The patient's INR is supratherapeutic at 3.1. I have asked him to skip today's dose of Coumadin and then resume his previous dose of Coumadin and recheck in 6 weeks. His blood pressure is well controlled. He continues to gain weight. He is not exercising. He continues to smoke. He is not checking his blood sugars. His last A1c was slightly elevated at greater than 7. He is interested in options to help with weight loss. He also reports neuropathic pain in both feet. He reports burning and stinging pain on the plantar aspects of both feet particularly worse at night when he is trying to sleep. His swelling is well controlled today Past Medical History:  Diagnosis Date  . Allergy    allergic rhinitis  . Atrial fibrillation (HCC)   . Colon polyps   . COPD (chronic obstructive pulmonary disease) (HCC)   . Degenerative disc disease, lumbar   . Diabetes mellitus  without complication (HCC)   . Hypertension   . Systolic murmur    Past Medical History:  Diagnosis Date  . Allergy    allergic rhinitis  . Atrial fibrillation (HCC)   . Colon polyps   . COPD (chronic obstructive pulmonary disease) (HCC)   . Degenerative disc disease, lumbar   . Diabetes mellitus without complication (HCC)   . Hypertension   . Systolic murmur    Current Outpatient Prescriptions on File Prior to Visit  Medication Sig Dispense Refill  . albuterol (PROVENTIL HFA;VENTOLIN HFA) 108 (90 Base) MCG/ACT inhaler Inhale 2 puffs  into the lungs every 6 (six) hours as needed for wheezing or shortness of breath. 1 Inhaler 0  . bumetanide (BUMEX) 1 MG tablet TAKE TWO TABLETS BY MOUTH IN THE MORNING AND TAKE ONE TABLET BY MOUTH AT NIGHT 90 tablet 11  . diltiazem (CARDIZEM CD) 240 MG 24 hr capsule TAKE ONE CAPSULE BY MOUTH ONCE DAILY 30 capsule 5  . fish oil-omega-3 fatty acids 1000 MG capsule Take 1 g by mouth daily.    Marland Kitchen KLOR-CON M20 20 MEQ tablet TAKE ONE TABLET BY MOUTH ONCE DAILY 30 tablet 11  . losartan (COZAAR) 100 MG tablet TAKE ONE TABLET BY MOUTH ONCE DAILY 90 tablet 3  . metFORMIN (GLUCOPHAGE) 1000 MG tablet TAKE ONE TABLET BY MOUTH TWICE DAILY 60 tablet 5  . metolazone (ZAROXOLYN) 5 MG tablet TAKE 1 PILL AND WAIT 30 MINUTES AND THEN TAKE BUMEX ON M/TH. DO NOT DO THIS EVERYDAY 30 tablet 5  . metoprolol (LOPRESSOR) 50 MG tablet TAKE ONE TABLET BY MOUTH TWICE DAILY 60 tablet 11  . warfarin (COUMADIN) 5 MG tablet TAKE AS DIRECTED (Patient taking differently: 2 tab po qd Tues, Thur, Sat, Sun. 1 tab po qd all other days) 90 tablet 5   No current facility-administered medications on file prior to visit.    No Known Allergies Social History   Social History  . Marital status: Married    Spouse name: N/A  . Number of children: 2  . Years of education: N/A   Occupational History  .      Machinist   Social History Main Topics  . Smoking status: Current Some Day Smoker    Packs/day: 1.50    Types: Cigarettes  . Smokeless tobacco: Never Used  . Alcohol use No  . Drug use: No  . Sexual activity: Not Currently   Other Topics Concern  . Not on file   Social History Narrative  . No narrative on file     Review of Systems  All other systems reviewed and are negative.      Objective:   Physical Exam  Neck: No JVD present.  Cardiovascular: Normal rate and normal heart sounds.   Pulmonary/Chest: Effort normal. He has no wheezes. He has no rales.  Abdominal: Soft. Bowel sounds are normal. He exhibits no  distension. There is no tenderness. There is no rebound and no guarding.  Musculoskeletal: He exhibits no edema.  Lymphadenopathy:    He has no cervical adenopathy.  Skin: No erythema.  Vitals reviewed.         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 Add gabapentin 300 mg by mouth daily at bedtime for diabetic neuropathy. Blood pressures controlled. Check fasting lipid panel. Goal LDL cholesterol is less than 100. Check hemoglobin A1c. Depending upon lab results, I will likely start this patient on Victoza 1.2 mg sq daily.

## 2016-08-10 ENCOUNTER — Encounter: Payer: Self-pay | Admitting: Physician Assistant

## 2016-08-10 ENCOUNTER — Ambulatory Visit (INDEPENDENT_AMBULATORY_CARE_PROVIDER_SITE_OTHER): Payer: BLUE CROSS/BLUE SHIELD | Admitting: Physician Assistant

## 2016-08-10 VITALS — BP 100/72 | HR 87 | Temp 97.9°F | Resp 16 | Wt 321.0 lb

## 2016-08-10 DIAGNOSIS — R600 Localized edema: Secondary | ICD-10-CM | POA: Diagnosis not present

## 2016-08-11 NOTE — Progress Notes (Signed)
Patient ID: William Alvarado MRN: [id number], DOB: 11-02-1950, 65 y.o. Date of Encounter: 08/11/2016, 7:26 AM    Chief Complaint:  Chief Complaint  Patient presents with  . fluid in thighs and lower abdominal    x 13months     HPI: 65 y.o. year old male here with his wife.   They report that this has happened before. Says that he feels like he is swollen in his abdomen and his thighs. Says that he has been swollen like this in the past. I reviewed Dr. Caren Macadam last visit note. Asked patient if he saw cardiologist. States that he saw them about 3 or 4 years ago when he had A. Fib.  I reviewed his echocardiogram from 09/27/15. Showed EF 60-65%. Left atrium  severely dilated. Right atrium moderately dilated. Pulmonary artery pressure was mildly increased.  Pt and wife report that he has been taking diuretics as the following: On a daily basis he takes Bumex 2 in the morning 1 in the afternoon. He had been taking the Zaroxolyn on Monday and Thursdays until for the past 2 weeks they increased this to Monday Wednesday Friday. He has changed his Zaroxolyn to taking it on these days for the past 2 weeks but says he has not been able tell any difference/ no improvement-- in the swelling.  He has no other complaints or concerns.     Home Meds:   Outpatient Medications Prior to Visit  Medication Sig Dispense Refill  . bumetanide (BUMEX) 1 MG tablet TAKE TWO TABLETS BY MOUTH IN THE MORNING AND TAKE ONE TABLET BY MOUTH AT NIGHT 90 tablet 11  . diltiazem (CARDIZEM CD) 240 MG 24 hr capsule TAKE ONE CAPSULE BY MOUTH ONCE DAILY 30 capsule 5  . fish oil-omega-3 fatty acids 1000 MG capsule Take 1 g by mouth daily.    Marland Kitchen gabapentin (NEURONTIN) 300 MG capsule Take 1 capsule (300 mg total) by mouth at bedtime. 30 capsule 3  . KLOR-CON M20 20 MEQ tablet TAKE ONE TABLET BY MOUTH ONCE DAILY 30 tablet 11  . losartan (COZAAR) 100 MG tablet TAKE ONE TABLET BY MOUTH ONCE DAILY 90 tablet 3  . metFORMIN  (GLUCOPHAGE) 1000 MG tablet TAKE ONE TABLET BY MOUTH TWICE DAILY 60 tablet 5  . metolazone (ZAROXOLYN) 5 MG tablet TAKE 1 PILL AND WAIT 30 MINUTES AND THEN TAKE BUMEX ON M/TH. DO NOT DO THIS EVERYDAY 30 tablet 5  . metoprolol (LOPRESSOR) 50 MG tablet TAKE ONE TABLET BY MOUTH TWICE DAILY 60 tablet 11  . warfarin (COUMADIN) 5 MG tablet TAKE AS DIRECTED (Patient taking differently: 2 tab po qd Tues, Thur, Sat, Sun. 1 tab po qd all other days) 90 tablet 5  . albuterol (PROVENTIL HFA;VENTOLIN HFA) 108 (90 Base) MCG/ACT inhaler Inhale 2 puffs into the lungs every 6 (six) hours as needed for wheezing or shortness of breath. (Patient not taking: Reported on 08/10/2016) 1 Inhaler 0   No facility-administered medications prior to visit.     Allergies: No Known Allergies    Review of Systems: See HPI for pertinent ROS. All other ROS negative.    Physical Exam: Blood pressure 100/72, pulse 87, temperature 97.9 F (36.6 C), temperature source Oral, resp. rate 16, weight (!) 321 lb (145.6 kg), SpO2 93 %., Body mass index is 42.35 kg/m. General:  Obese WM. Appears in no acute distress. Neck: Supple. No thyromegaly. No lymphadenopathy. Lungs: Clear bilaterally to auscultation without wheezes, rales, or rhonchi. Breathing is unlabored. Heart:  Regular rhythm. No murmurs, rubs, or gallops. Msk:  Strength and tone normal for age. Extremities/Skin: Feet are dark brownish-purplish color--pt says this is chronic. Brawny changes up shins. 3+ pitting edema of feet bilaterally. 2+ pitting edema half way up shins.  Neuro: Alert and oriented X 3. Moves all extremities spontaneously. Gait is normal. CNII-XII grossly in tact. Psych:  Responds to questions appropriately with a normal affect.     ASSESSMENT AND PLAN:  65 y.o. year old male with  1. Lower extremity edema I reviewed his CMET from 07/10/16. At that time his BUN and creatinine were normal. At this time will have him continue the Bumex at the current  dosing. I will have him take the Zaroxolyn every day and he is scheduling follow-up appointment with Dr. Tanya NonesPickard this upcoming Monday.   Murray HodgkinsSigned, Mary Beth Bellows Falls[place], [place], [facility] 08/11/2016 7:26 AM

## 2016-08-18 ENCOUNTER — Ambulatory Visit (INDEPENDENT_AMBULATORY_CARE_PROVIDER_SITE_OTHER): Payer: BLUE CROSS/BLUE SHIELD | Admitting: Family Medicine

## 2016-08-18 ENCOUNTER — Encounter: Payer: Self-pay | Admitting: Family Medicine

## 2016-08-18 VITALS — BP 90/60 | HR 86 | Temp 98.4°F | Resp 22 | Ht 73.0 in | Wt 328.0 lb

## 2016-08-18 DIAGNOSIS — I2609 Other pulmonary embolism with acute cor pulmonale: Secondary | ICD-10-CM

## 2016-08-18 MED ORDER — FUROSEMIDE 40 MG PO TABS: 120.0000 mg | ORAL_TABLET | Freq: Two times a day (BID) | ORAL | 3 refills | Status: DC

## 2016-08-18 NOTE — Progress Notes (Signed)
Subjective:    Patient ID: William Alvarado, male    DOB: 04-08-1951, 65 y.o.   MRN: [id number]  Medication Refill   Patient has severe cor pulmonale due to morbid obesity, COPD and continued tobacco abuse, and I suspect severe obstructive sleep apnea although he refuses to allow me to schedule sleep study. He saw my partner last week due to fluid retention. He had gained 14 pounds since my last office visit. She instructed the patient to take Zaroxolyn every day in addition to his Bumex. Unfortunately he has done this and he has gained an additional 7 pounds. He is now 21 pounds above his weight when I saw him last. He reports shortness of breath. He has tense pitting edema in both legs. He has abdominal edema/possible ascites. He denies any chest pain. He denies any palpitations Past Medical History:  Diagnosis Date  . Allergy    allergic rhinitis  . Atrial fibrillation (HCC)   . Colon polyps   . COPD (chronic obstructive pulmonary disease) (HCC)   . Degenerative disc disease, lumbar   . Diabetes mellitus without complication (HCC)   . Hypertension   . Systolic murmur    Past Medical History:  Diagnosis Date  . Allergy    allergic rhinitis  . Atrial fibrillation (HCC)   . Colon polyps   . COPD (chronic obstructive pulmonary disease) (HCC)   . Degenerative disc disease, lumbar   . Diabetes mellitus without complication (HCC)   . Hypertension   . Systolic murmur    Current Outpatient Prescriptions on File Prior to Visit  Medication Sig Dispense Refill  . bumetanide (BUMEX) 1 MG tablet TAKE TWO TABLETS BY MOUTH IN THE MORNING AND TAKE ONE TABLET BY MOUTH AT NIGHT 90 tablet 11  . diltiazem (CARDIZEM CD) 240 MG 24 hr capsule TAKE ONE CAPSULE BY MOUTH ONCE DAILY 30 capsule 5  . fish oil-omega-3 fatty acids 1000 MG capsule Take 1 g by mouth daily.    Marland Kitchen gabapentin (NEURONTIN) 300 MG capsule Take 1 capsule (300 mg total) by mouth at bedtime. 30 capsule 3  . KLOR-CON M20 20 MEQ tablet  TAKE ONE TABLET BY MOUTH ONCE DAILY 30 tablet 11  . losartan (COZAAR) 100 MG tablet TAKE ONE TABLET BY MOUTH ONCE DAILY 90 tablet 3  . metFORMIN (GLUCOPHAGE) 1000 MG tablet TAKE ONE TABLET BY MOUTH TWICE DAILY 60 tablet 5  . metolazone (ZAROXOLYN) 5 MG tablet TAKE 1 PILL AND WAIT 30 MINUTES AND THEN TAKE BUMEX ON M/TH. DO NOT DO THIS EVERYDAY (Patient taking differently: Take 5 mg by mouth daily. TAKE 1 PILL AND WAIT 30 MINUTES AND THEN TAKE BUMEX ON M/TH. DO NOT DO THIS EVERYDAY) 30 tablet 5  . metoprolol (LOPRESSOR) 50 MG tablet TAKE ONE TABLET BY MOUTH TWICE DAILY 60 tablet 11  . warfarin (COUMADIN) 5 MG tablet TAKE AS DIRECTED (Patient taking differently: 2 tab po qd Tues, Thur, Sat, Sun. 1 tab po qd all other days) 90 tablet 5  . albuterol (PROVENTIL HFA;VENTOLIN HFA) 108 (90 Base) MCG/ACT inhaler Inhale 2 puffs into the lungs every 6 (six) hours as needed for wheezing or shortness of breath. (Patient not taking: Reported on 08/18/2016) 1 Inhaler 0   No current facility-administered medications on file prior to visit.    No Known Allergies Social History   Social History  . Marital status: Married    Spouse name: N/A  . Number of children: 2  . Years of education: N/A  Occupational History  .      Machinist   Social History Main Topics  . Smoking status: Current Some Day Smoker    Packs/day: 1.50    Types: Cigarettes  . Smokeless tobacco: Never Used  . Alcohol use No  . Drug use: No  . Sexual activity: Not Currently   Other Topics Concern  . Not on file   Social History Narrative  . No narrative on file     Review of Systems  All other systems reviewed and are negative.      Objective:   Physical Exam  Neck: No JVD present.  Cardiovascular: Normal rate and normal heart sounds.   Pulmonary/Chest: Effort normal. He has wheezes. He has no rales.  Abdominal: Soft. Bowel sounds are normal. He exhibits no distension. There is no tenderness. There is no rebound and no  guarding.  Musculoskeletal: He exhibits edema.  Lymphadenopathy:    He has no cervical adenopathy.  Skin: No erythema.  Vitals reviewed.         Assessment & Plan:  Acute cor pulmonale (HCC) - Plan: furosemide (LASIX) 40 MG tablet, BASIC METABOLIC PANEL WITH GFR  Patient's blood pressure is extremely low preventing diuresis safely. Given the fact I believe he has right-sided heart failure, I will discontinue Cardizem. He is currently in normal sinus rhythm. I will also discontinue losartan to avoid hypotension. I will have the patient discontinue Bumex. I'll place him on Lasix 120 mg by mouth twice a day in addition to Zaroxolyn 5 mg 30 minutes prior to Lasix first thing in the morning. He is to double his potassium dosage. I placed the patient in bilateral Unna boots to help facilitate diuresis. Recheck here on Friday to reassess the patient's renal function, potassium, and weight to avoid overdiuresis and hypotension.

## 2016-08-19 LAB — BASIC METABOLIC PANEL WITH GFR
BUN: 34 mg/dL — AB (ref 7–25)
CALCIUM: 8.7 mg/dL (ref 8.6–10.3)
CO2: 38 mmol/L — ABNORMAL HIGH (ref 20–31)
CREATININE: 1.19 mg/dL (ref 0.70–1.25)
Chloride: 91 mmol/L — ABNORMAL LOW (ref 98–110)
GFR, EST AFRICAN AMERICAN: 75 mL/min (ref >=60)
GFR, Est Non African American: 65 mL/min (ref >=60)
Glucose, Bld: 101 mg/dL — ABNORMAL HIGH (ref 70–99)
Potassium: 3.6 mmol/L (ref 3.5–5.3)
Sodium: 137 mmol/L (ref 135–146)

## 2016-08-21 ENCOUNTER — Encounter: Payer: Self-pay | Admitting: Physician Assistant

## 2016-08-21 ENCOUNTER — Ambulatory Visit (INDEPENDENT_AMBULATORY_CARE_PROVIDER_SITE_OTHER): Payer: BLUE CROSS/BLUE SHIELD | Admitting: Physician Assistant

## 2016-08-21 VITALS — BP 110/70 | HR 90 | Temp 98.1°F | Resp 18 | Ht 73.0 in | Wt 307.0 lb

## 2016-08-21 DIAGNOSIS — I2609 Other pulmonary embolism with acute cor pulmonale: Secondary | ICD-10-CM

## 2016-08-21 MED ORDER — FUROSEMIDE 40 MG PO TABS: ORAL_TABLET | ORAL | 0 refills | Status: DC

## 2016-08-21 NOTE — Progress Notes (Signed)
Patient ID: JENKINS RISDON MRN: [id number], DOB: 1951/08/10, 65 y.o. Date of Encounter: 08/21/2016, 4:08 PM    Chief Complaint:  Chief Complaint  Patient presents with  . swelling in legs and stomach    f/u     HPI: 65 y.o. year old male here with his wife.   08/10/2016--OV with me: HPI: They report that this has happened before. Says that he feels like he is swollen in his abdomen and his thighs. Says that he has been swollen like this in the past. I reviewed Dr. Caren Macadam last visit note. Asked patient if he saw cardiologist. States that he saw them about 3 or 4 years ago when he had A. Fib.  I reviewed his echocardiogram from 09/27/15. Showed EF 60-65%. Left atrium  severely dilated. Right atrium moderately dilated. Pulmonary artery pressure was mildly increased.  Pt and wife report that he has been taking diuretics as the following: On a daily basis he takes Bumex 2 in the morning 1 in the afternoon. He had been taking the Zaroxolyn on Monday and Thursdays until for the past 2 weeks they increased this to Monday Wednesday Friday. He has changed his Zaroxolyn to taking it on these days for the past 2 weeks but says he has not been able tell any difference/ no improvement-- in the swelling.  ASSESSMENT/PLAN:  1. Lower extremity edema I reviewed his CMET from 07/10/16. At that time his BUN and creatinine were normal. At this time will have him continue the Bumex at the current dosing. I will have him take the Zaroxolyn every day and he is scheduling follow-up appointment with Dr. Tanya Nones this upcoming Monday.    OV NOTE WITH DR. PICKARD 08/18/2016: HPI: Patient has severe cor pulmonale due to morbid obesity, COPD and continued tobacco abuse, and I suspect severe obstructive sleep apnea although he refuses to allow me to schedule sleep study. He saw my partner last week due to fluid retention. He had gained 14 pounds since my last office visit. She instructed the patient to take  Zaroxolyn every day in addition to his Bumex. Unfortunately he has done this and he has gained an additional 7 pounds. He is now 63 pounds above his weight when I saw him last. He reports shortness of breath. He has tense pitting edema in both legs. He has abdominal edema/possible ascites. He denies any chest pain. He denies any palpitations   Assessment & Plan:  Acute cor pulmonale (HCC) - Plan: furosemide (LASIX) 40 MG tablet, BASIC METABOLIC PANEL WITH GFR  Patient's blood pressure is extremely low preventing diuresis safely. Given the fact I believe he has right-sided heart failure, I will discontinue Cardizem. He is currently in normal sinus rhythm. I will also discontinue losartan to avoid hypotension. I will have the patient discontinue Bumex. I'll place him on Lasix 120 mg by mouth twice a day in addition to Zaroxolyn 5 mg 30 minutes prior to Lasix first thing in the morning. He is to double his potassium dosage. I placed the patient in bilateral Unna boots to help facilitate diuresis. Recheck here on Friday to reassess the patient's renal function, potassium, and weight to avoid overdiuresis and hypotension.   TODAY---08/21/2016: He still has on Unna boots so he isn't able to report changes he has noticed in swelling.  He reports that he has been taking the diuretics as directed at [facility] with Dr. Tanya Nones.   Home Meds:   Outpatient Medications Prior to Visit  Medication  Sig Dispense Refill  . albuterol (PROVENTIL HFA;VENTOLIN HFA) 108 (90 Base) MCG/ACT inhaler Inhale 2 puffs into the lungs every 6 (six) hours as needed for wheezing or shortness of breath. (Patient not taking: Reported on 08/18/2016) 1 Inhaler 0  . bumetanide (BUMEX) 1 MG tablet TAKE TWO TABLETS BY MOUTH IN THE MORNING AND TAKE ONE TABLET BY MOUTH AT NIGHT 90 tablet 11  . diltiazem (CARDIZEM CD) 240 MG 24 hr capsule TAKE ONE CAPSULE BY MOUTH ONCE DAILY 30 capsule 5  . fish oil-omega-3 fatty acids 1000 MG capsule Take 1 g by  mouth daily.    . furosemide (LASIX) 40 MG tablet Take 3 tablets (120 mg total) by mouth 2 (two) times daily. 180 tablet 3  . gabapentin (NEURONTIN) 300 MG capsule Take 1 capsule (300 mg total) by mouth at bedtime. 30 capsule 3  . KLOR-CON M20 20 MEQ tablet TAKE ONE TABLET BY MOUTH ONCE DAILY 30 tablet 11  . losartan (COZAAR) 100 MG tablet TAKE ONE TABLET BY MOUTH ONCE DAILY 90 tablet 3  . metFORMIN (GLUCOPHAGE) 1000 MG tablet TAKE ONE TABLET BY MOUTH TWICE DAILY 60 tablet 5  . metolazone (ZAROXOLYN) 5 MG tablet TAKE 1 PILL AND WAIT 30 MINUTES AND THEN TAKE BUMEX ON M/TH. DO NOT DO THIS EVERYDAY (Patient taking differently: Take 5 mg by mouth daily. TAKE 1 PILL AND WAIT 30 MINUTES AND THEN TAKE BUMEX ON M/TH. DO NOT DO THIS EVERYDAY) 30 tablet 5  . metoprolol (LOPRESSOR) 50 MG tablet TAKE ONE TABLET BY MOUTH TWICE DAILY 60 tablet 11  . warfarin (COUMADIN) 5 MG tablet TAKE AS DIRECTED (Patient taking differently: 2 tab po qd Tues, Thur, Sat, Sun. 1 tab po qd all other days) 90 tablet 5   No facility-administered medications prior to visit.     Allergies: No Known Allergies    Review of Systems: See HPI for pertinent ROS. All other ROS negative.    Physical Exam: Blood pressure 110/70, pulse 90, temperature 98.1 F (36.7 C), temperature source Oral, resp. rate 18, height 6\' 1"  (1.854 m), weight (!) 307 lb (139.3 kg), SpO2 90 %., Body mass index is 40.5 kg/m. General:  Obese WM. Appears in no acute distress. Neck: Supple. No thyromegaly. No lymphadenopathy. Lungs: Clear bilaterally to auscultation without wheezes, rales, or rhonchi. Breathing is unlabored. Heart: Regular rhythm. No murmurs, rubs, or gallops. Msk:  Strength and tone normal for age. Extremities/Skin: I removed Engineer, maintenance (IT). Feet are dark brownish-purplish color-- this is chronic. Brawny changes up shins. Because they were wrapped tightly, difficult to assess pitting edema but definitely appears improved.  Neuro: Alert  and oriented X 3. Moves all extremities spontaneously. Gait is normal. CNII-XII grossly in tact. Psych:  Responds to questions appropriately with a normal affect.     ASSESSMENT AND PLAN:  65 y.o. year old male with   1. Acute cor pulmonale (HCC)  Reviewed Weights:  08/10/2016--- 321 lb 08/18/2016----328 lb 08/21/2016-----307 lb  Has successfully diuresed extra fluid.   BP stable at 110/70. Cont to hold Cardizem and Losartan to avoid hypotension while diuresing.   Will keep off the Bumex now.   Currently Lasix 120mg  BID-----will decrease to 80mg  BID Cont Zaroxolyn 5mg  30 minutes prior to Lasix first thing in the morning.  Cont current potassium dose for now.  Check BMET now.   I was going to have him f/u with Dr. Tanya Nones this upcoming Monday or Tuesday. But, pt states he has to come in  late afternoon secondary to his work schedule and Dr. Tanya NonesPickard has no available appointments at those times. Therefore, he is scheduling f/u with me Monday afternoon.  He does have a set of digital scales at home.  I told him to weigh every morning and document weights.  If gains > 2 -3 pounds, call us.   - BASIC METABOLIC PANEL WITH GFR - furosemide (LASIX) 40 MG tablet; Take 2 every morning, 2 every afternoon.  Dispense: 120 tablet; Refill: 0    Signed, [address]Mary Beth Oak HillDixon, [place], [facility] 08/21/2016 4:08 PM

## 2016-08-22 LAB — BASIC METABOLIC PANEL WITH GFR
BUN: 29 mg/dL — AB (ref 7–25)
CHLORIDE: 84 mmol/L — AB (ref 98–110)
CO2: 42 mmol/L — ABNORMAL HIGH (ref 20–31)
Calcium: 9.5 mg/dL (ref 8.6–10.3)
Creat: 1.12 mg/dL (ref 0.70–1.25)
GFR, Est African American: 80 mL/min (ref >=60)
GFR, Est Non African American: 70 mL/min (ref >=60)
GLUCOSE: 113 mg/dL — AB (ref 70–99)
POTASSIUM: 3.2 mmol/L — AB (ref 3.5–5.3)
Sodium: 137 mmol/L (ref 135–146)

## 2016-08-24 ENCOUNTER — Ambulatory Visit (INDEPENDENT_AMBULATORY_CARE_PROVIDER_SITE_OTHER): Payer: BLUE CROSS/BLUE SHIELD | Admitting: Physician Assistant

## 2016-08-24 ENCOUNTER — Encounter: Payer: Self-pay | Admitting: Physician Assistant

## 2016-08-24 VITALS — BP 110/70 | HR 112 | Temp 98.0°F | Resp 18 | Wt 296.0 lb

## 2016-08-24 DIAGNOSIS — I2609 Other pulmonary embolism with acute cor pulmonale: Secondary | ICD-10-CM | POA: Diagnosis not present

## 2016-08-24 DIAGNOSIS — I5033 Acute on chronic diastolic (congestive) heart failure: Secondary | ICD-10-CM | POA: Diagnosis not present

## 2016-08-24 MED ORDER — FUROSEMIDE 40 MG PO TABS: ORAL_TABLET | ORAL | 0 refills | Status: DC

## 2016-08-24 NOTE — Progress Notes (Signed)
Patient ID: William Alvarado MRN: [id number], DOB: 05-13-1951, 65 y.o. Date of Encounter: 08/24/2016, 3:02 PM    Chief Complaint:  No chief complaint on file.    HPI: 65 y.o. year old male here with his wife.   08/10/2016--OV with me: HPI: They report that this has happened before. Says that he feels like he is swollen in his abdomen and his thighs. Says that he has been swollen like this in the past. I reviewed Dr. Caren Macadam last visit note. Asked patient if he saw cardiologist. States that he saw them about 3 or 4 years ago when he had A. Fib.  I reviewed his echocardiogram from 09/27/15. Showed EF 60-65%. Left atrium  severely dilated. Right atrium moderately dilated. Pulmonary artery pressure was mildly increased.  Pt and wife report that he has been taking diuretics as the following: On a daily basis he takes Bumex 2 in the morning 1 in the afternoon. He had been taking the Zaroxolyn on Monday and Thursdays until for the past 2 weeks they increased this to Monday Wednesday Friday. He has changed his Zaroxolyn to taking it on these days for the past 2 weeks but says he has not been able tell any difference/ no improvement-- in the swelling.  ASSESSMENT/PLAN:  1. Lower extremity edema I reviewed his CMET from 07/10/16. At that time his BUN and creatinine were normal. At this time will have him continue the Bumex at the current dosing. I will have him take the Zaroxolyn every day and he is scheduling follow-up appointment with Dr. Tanya Nones this upcoming Monday.      OV NOTE WITH DR. PICKARD 08/18/2016: HPI: Patient has severe cor pulmonale due to morbid obesity, COPD and continued tobacco abuse, and I suspect severe obstructive sleep apnea although he refuses to allow me to schedule sleep study. He saw my partner last week due to fluid retention. He had gained 14 pounds since my last office visit. She instructed the patient to take Zaroxolyn every day in addition to his Bumex.  Unfortunately he has done this and he has gained an additional 7 pounds. He is now 21 pounds above his weight when I saw him last. He reports shortness of breath. He has tense pitting edema in both legs. He has abdominal edema/possible ascites. He denies any chest pain. He denies any palpitations   Assessment & Plan:  Acute cor pulmonale (HCC) - Plan: furosemide (LASIX) 40 MG tablet, BASIC METABOLIC PANEL WITH GFR  Patient's blood pressure is extremely low preventing diuresis safely. Given the fact I believe he has right-sided heart failure, I will discontinue Cardizem. He is currently in normal sinus rhythm. I will also discontinue losartan to avoid hypotension. I will have the patient discontinue Bumex. I'll place him on Lasix 120 mg by mouth twice a day in addition to Zaroxolyn 5 mg 30 minutes prior to Lasix first thing in the morning. He is to double his potassium dosage. I placed the patient in bilateral Unna boots to help facilitate diuresis. Recheck here on Friday to reassess the patient's renal function, potassium, and weight to avoid overdiuresis and hypotension.     OV with me---Friday---08/21/2016: He still has on Unna boots so he isn't able to report changes he has noticed in swelling.  He reports that he has been taking the diuretics as directed at [facility] with Dr. Tanya Nones.   ASSESSMENT/PLAN AT THAT OV: 1. Acute cor pulmonale (HCC)  Reviewed Weights:  08/10/2016--- 321 lb 08/18/2016----328 lb  08/21/2016-----307 lb  Has successfully diuresed extra fluid.   BP stable at 110/70. Cont to hold Cardizem and Losartan to avoid hypotension while diuresing.   Will keep off the Bumex now.   Currently Lasix 120mg  BID-----will decrease to 80mg  BID Cont Zaroxolyn 5mg  30 minutes prior to Lasix first thing in the morning.  Cont current potassium dose for now.  Check BMET now.   I was going to have him f/u with Dr. Tanya Nones this upcoming Monday or Tuesday. But, pt states he has to come in  late afternoon secondary to his work schedule and Dr. Tanya Nones has no available appointments at those times. Therefore, he is scheduling f/u with me Monday afternoon.  He does have a set of digital scales at home.  I told him to weigh every morning and document weights.  If gains > 2 -3 pounds, call us.   - BASIC METABOLIC PANEL WITH GFR - furosemide (LASIX) 40 MG tablet; Take 2 every morning, 2 every afternoon.  Dispense: 120 tablet; Refill: 0     OV with me Monday 08/24/2016: Verified that he has been taking medications as directed. He has been checking his weights daily at home and has continued to see gradual decrease in weights. Says that he is continuing to notice more and more improvement. Getting his shoes and socks on easier. Wearing TED hose stockings. Has quit smoking--at least for the past week---says he "really enjoys it"----I discussed Rxing med to help him stay off cigarettes---says in past he couldn't afford it----today I am giving him handout with tips for cessation    Home Meds:   Outpatient Medications Prior to Visit  Medication Sig Dispense Refill  . albuterol (PROVENTIL HFA;VENTOLIN HFA) 108 (90 Base) MCG/ACT inhaler Inhale 2 puffs into the lungs every 6 (six) hours as needed for wheezing or shortness of breath. (Patient not taking: Reported on 08/18/2016) 1 Inhaler 0  . bumetanide (BUMEX) 1 MG tablet TAKE TWO TABLETS BY MOUTH IN THE MORNING AND TAKE ONE TABLET BY MOUTH AT NIGHT 90 tablet 11  . diltiazem (CARDIZEM CD) 240 MG 24 hr capsule TAKE ONE CAPSULE BY MOUTH ONCE DAILY 30 capsule 5  . fish oil-omega-3 fatty acids 1000 MG capsule Take 1 g by mouth daily.    . furosemide (LASIX) 40 MG tablet Take 2 every morning, 2 every afternoon. 120 tablet 0  . gabapentin (NEURONTIN) 300 MG capsule Take 1 capsule (300 mg total) by mouth at bedtime. 30 capsule 3  . KLOR-CON M20 20 MEQ tablet TAKE ONE TABLET BY MOUTH ONCE DAILY 30 tablet 11  . losartan (COZAAR) 100 MG tablet  TAKE ONE TABLET BY MOUTH ONCE DAILY 90 tablet 3  . metFORMIN (GLUCOPHAGE) 1000 MG tablet TAKE ONE TABLET BY MOUTH TWICE DAILY 60 tablet 5  . metolazone (ZAROXOLYN) 5 MG tablet TAKE 1 PILL AND WAIT 30 MINUTES AND THEN TAKE BUMEX ON M/TH. DO NOT DO THIS EVERYDAY (Patient taking differently: Take 5 mg by mouth daily. TAKE 1 PILL AND WAIT 30 MINUTES AND THEN TAKE BUMEX ON M/TH. DO NOT DO THIS EVERYDAY) 30 tablet 5  . metoprolol (LOPRESSOR) 50 MG tablet TAKE ONE TABLET BY MOUTH TWICE DAILY 60 tablet 11  . warfarin (COUMADIN) 5 MG tablet TAKE AS DIRECTED (Patient taking differently: 2 tab po qd Tues, Thur, Sat, Sun. 1 tab po qd all other days) 90 tablet 5   No facility-administered medications prior to visit.     Allergies: No Known Allergies  Review of Systems: See HPI for pertinent ROS. All other ROS negative.    Physical Exam: Blood pressure 110/70, 92 pulse , temperature 98 F (36.7 C), temperature source Oral, resp. rate 18, weight 296 lb (134.3 kg), SpO2 98 %., There is no height or weight on file to calculate BMI. General:  Obese WM. Appears in no acute distress. Neck: Supple. No thyromegaly. No lymphadenopathy. Lungs: Clear bilaterally to auscultation without wheezes, rales, or rhonchi. Breathing is unlabored. Heart: Regular rhythm. No murmurs, rubs, or gallops. Msk:  Strength and tone normal for age. Extremities/Skin: LE edema much improved.  Neuro: Alert and oriented X 3. Moves all extremities spontaneously. Gait is normal. CNII-XII grossly in tact. Psych:  Responds to questions appropriately with a normal affect.     ASSESSMENT AND PLAN:  65 y.o. year old male with   1. Acute on chronic diastolic heart failure (HCC) 2. Acute cor pulmonale (HCC)  - BASIC METABOLIC PANEL WITH GFR  - furosemide (LASIX) 40 MG tablet; Take 2 every morning, 1 every afternoon.  Dispense: 90 tablet; Refill: 0   Reviewed Weights:  08/10/2016--- 321 lb 08/18/2016----328 lb 08/21/2016-----307  lb 08/24/2016------296 lb  At this time: Will decrease Lasix from 80mg  BID to 80mg  AM, 40mg  afternoon. Cont Zaroxolyn 5mg  30 minutes prior to morning dose of Lasix Cont K 20meq QD  The following medications have been discontinued (held) to avoid hypotension during diuresis (may eventually need to be added back in the future)  But I went ahead and removed them from med list so pt does not get confused (BP 110/70 today): Bumex Cardizem Losartan   Reviewed prior weights:  03/27/2016:  296 lb------------same as today's weight 07/10/2016: 307 lb---then see above.  He will continue to weigh himself daily at home and write these down. He will call me on either Wednesday or Thursday with his weights for me to review. He will schedule follow-up office visit with me in one week which is Monday 08/21/2016      Signed, [facility] HospitalMary Beth LanareDixon, [place], [facility] 08/24/2016 3:02 PM

## 2016-08-25 LAB — BASIC METABOLIC PANEL WITH GFR
BUN: 41 mg/dL — ABNORMAL HIGH (ref 7–25)
CHLORIDE: 84 mmol/L — AB (ref 98–110)
CO2: 43 mmol/L — AB (ref 20–31)
CREATININE: 1.32 mg/dL — AB (ref 0.70–1.25)
Calcium: 9.8 mg/dL (ref 8.6–10.3)
GFR, EST NON AFRICAN AMERICAN: 57 mL/min — AB (ref >=60)
GFR, Est African American: 66 mL/min (ref >=60)
Glucose, Bld: 130 mg/dL — ABNORMAL HIGH (ref 70–99)
POTASSIUM: 3.1 mmol/L — AB (ref 3.5–5.3)
SODIUM: 137 mmol/L (ref 135–146)

## 2016-08-27 MED ORDER — POTASSIUM CHLORIDE CRYS ER 20 MEQ PO TBCR: EXTENDED_RELEASE_TABLET | ORAL | 11 refills | Status: DC

## 2016-08-27 NOTE — Addendum Note (Signed)
Addended by: Allayne Butcher on: 08/27/2016 05:11 PM   Modules accepted: Orders

## 2016-08-31 ENCOUNTER — Ambulatory Visit (INDEPENDENT_AMBULATORY_CARE_PROVIDER_SITE_OTHER): Payer: BLUE CROSS/BLUE SHIELD | Admitting: Family Medicine

## 2016-08-31 ENCOUNTER — Encounter: Payer: Self-pay | Admitting: Family Medicine

## 2016-08-31 VITALS — BP 118/74 | HR 96 | Temp 99.2°F | Resp 20 | Ht 73.0 in | Wt 293.0 lb

## 2016-08-31 DIAGNOSIS — I5033 Acute on chronic diastolic (congestive) heart failure: Secondary | ICD-10-CM

## 2016-08-31 DIAGNOSIS — I481 Persistent atrial fibrillation: Secondary | ICD-10-CM

## 2016-08-31 DIAGNOSIS — I2609 Other pulmonary embolism with acute cor pulmonale: Secondary | ICD-10-CM

## 2016-08-31 DIAGNOSIS — I4819 Other persistent atrial fibrillation: Secondary | ICD-10-CM

## 2016-08-31 LAB — PT WITH INR/FINGERSTICK
INR FINGERSTICK: 3 — AB (ref 0.80–1.20)
PT, fingerstick: 36.2 seconds — ABNORMAL HIGH (ref 10.4–12.5)

## 2016-08-31 NOTE — Progress Notes (Signed)
Subjective:    Patient ID: William Alvarado, male    DOB: 11/11/1950, 65 y.o.   MRN: [id number]  Medication Refill   08/18/16 Patient has severe cor pulmonale due to morbid obesity, COPD and continued tobacco abuse, and I suspect severe obstructive sleep apnea although he refuses to allow me to schedule sleep study. He saw my partner last week due to fluid retention. He had gained 14 pounds since my last office visit. She instructed the patient to take Zaroxolyn every day in addition to his Bumex. Unfortunately he has done this and he has gained an additional 7 pounds. He is now 21 pounds above his weight when I saw him last. He reports shortness of breath. He has tense pitting edema in both legs. He has abdominal edema/possible ascites. He denies any chest pain. He denies any palpitations.  At that time, my plan was: Patient's blood pressure is extremely low preventing diuresis safely. Given the fact I believe he has right-sided heart failure, I will discontinue Cardizem. He is currently in normal sinus rhythm. I will also discontinue losartan to avoid hypotension. I will have the patient discontinue Bumex. I'll place him on Lasix 120 mg by mouth twice a day in addition to Zaroxolyn 5 mg 30 minutes prior to Lasix first thing in the morning. He is to double his potassium dosage. I placed the patient in bilateral Unna boots to help facilitate diuresis. Recheck here on Friday to reassess the patient's renal function, potassium, and weight to avoid overdiuresis and hypotension.  08/31/16 Since I last saw the patient, he has lost 28 pounds or more. His breathing is much better. The pitting edema in his legs is essentially resolved. He is trying to quit smoking. We have not scheduled him for a sleep study. Overall he feels much better. He is due for an INR check Past Medical History:  Diagnosis Date  . Allergy    allergic rhinitis  . Atrial fibrillation (HCC)   . Colon polyps   . COPD (chronic  obstructive pulmonary disease) (HCC)   . Degenerative disc disease, lumbar   . Diabetes mellitus without complication (HCC)   . Hypertension   . Systolic murmur    Past Medical History:  Diagnosis Date  . Allergy    allergic rhinitis  . Atrial fibrillation (HCC)   . Colon polyps   . COPD (chronic obstructive pulmonary disease) (HCC)   . Degenerative disc disease, lumbar   . Diabetes mellitus without complication (HCC)   . Hypertension   . Systolic murmur    Current Outpatient Prescriptions on File Prior to Visit  Medication Sig Dispense Refill  . albuterol (PROVENTIL HFA;VENTOLIN HFA) 108 (90 Base) MCG/ACT inhaler Inhale 2 puffs into the lungs every 6 (six) hours as needed for wheezing or shortness of breath. 1 Inhaler 0  . fish oil-omega-3 fatty acids 1000 MG capsule Take 1 g by mouth daily.    . furosemide (LASIX) 40 MG tablet Take 2 every morning, 1 every afternoon. 90 tablet 0  . gabapentin (NEURONTIN) 300 MG capsule Take 1 capsule (300 mg total) by mouth at bedtime. 30 capsule 3  . metFORMIN (GLUCOPHAGE) 1000 MG tablet TAKE ONE TABLET BY MOUTH TWICE DAILY 60 tablet 5  . metolazone (ZAROXOLYN) 5 MG tablet TAKE 1 PILL AND WAIT 30 MINUTES AND THEN TAKE BUMEX ON M/TH. DO NOT DO THIS EVERYDAY (Patient taking differently: Take 5 mg by mouth daily. TAKE 1 PILL AND WAIT 30 MINUTES AND THEN TAKE  BUMEX ON M/TH. DO NOT DO THIS EVERYDAY) 30 tablet 5  . metoprolol (LOPRESSOR) 50 MG tablet TAKE ONE TABLET BY MOUTH TWICE DAILY 60 tablet 11  . potassium chloride SA (KLOR-CON M20) 20 MEQ tablet Take 1 twice daily--- as directed (Patient taking differently: Take 20 mEq by mouth daily. Take 1 twice daily--- as directed) 60 tablet 11  . warfarin (COUMADIN) 5 MG tablet TAKE AS DIRECTED (Patient taking differently: 2 tab po qd Tues, Thur, Sat, Sun. 1 tab po qd all other days) 90 tablet 5   No current facility-administered medications on file prior to visit.    No Known Allergies Social History    Social History  . Marital status: Married    Spouse name: N/A  . Number of children: 2  . Years of education: N/A   Occupational History  .      Machinist   Social History Main Topics  . Smoking status: Former Smoker    Packs/day: 1.50    Types: Cigarettes    Quit date: 08/18/2016  . Smokeless tobacco: Never Used  . Alcohol use No  . Drug use: No  . Sexual activity: Not Currently   Other Topics Concern  . Not on file   Social History Narrative  . No narrative on file     Review of Systems  All other systems reviewed and are negative.      Objective:   Physical Exam  Neck: No JVD present.  Cardiovascular: Normal rate and normal heart sounds.   Pulmonary/Chest: Effort normal. He has wheezes. He has no rales.  Abdominal: Soft. Bowel sounds are normal. He exhibits no distension. There is no tenderness. There is no rebound and no guarding.  Musculoskeletal: He exhibits edema.  Lymphadenopathy:    He has no cervical adenopathy.  Skin: No erythema.  Vitals reviewed.         Assessment & Plan:  Acute cor pulmonale (HCC) - Plan: BASIC METABOLIC PANEL WITH GFR  Acute on chronic diastolic heart failure (HCC) - Plan: BASIC METABOLIC PANEL WITH GFR  Persistent atrial fibrillation (HCC) - Plan: PT with INR/Fingerstick Continue Lasix 2 tablets in the morning 1 tablet in the evening. Change Zaroxolyn to 1  tablet every Monday which was his previous dose. Continue his current dose of potassium which is 1 tablet twice daily 20 mEq. Recheck potassium and renal function today and if stable follow-up again in 4 weeks. Schedule the patient for a sleep study. Continue to encourage smoking cessation

## 2016-09-01 ENCOUNTER — Other Ambulatory Visit: Payer: Self-pay | Admitting: Family Medicine

## 2016-09-01 DIAGNOSIS — I2609 Other pulmonary embolism with acute cor pulmonale: Secondary | ICD-10-CM

## 2016-09-01 DIAGNOSIS — I5033 Acute on chronic diastolic (congestive) heart failure: Secondary | ICD-10-CM

## 2016-09-01 DIAGNOSIS — I4819 Other persistent atrial fibrillation: Secondary | ICD-10-CM

## 2016-09-01 LAB — BASIC METABOLIC PANEL WITH GFR
BUN: 40 mg/dL — AB (ref 7–25)
CHLORIDE: 86 mmol/L — AB (ref 98–110)
CO2: 32 mmol/L — ABNORMAL HIGH (ref 20–31)
CREATININE: 1.62 mg/dL — AB (ref 0.70–1.25)
Calcium: 9.1 mg/dL (ref 8.6–10.3)
GFR, EST AFRICAN AMERICAN: 51 mL/min — AB (ref >=60)
GFR, Est Non African American: 45 mL/min — ABNORMAL LOW (ref >=60)
GLUCOSE: 146 mg/dL — AB (ref 70–99)
Potassium: 3.1 mmol/L — ABNORMAL LOW (ref 3.5–5.3)
Sodium: 137 mmol/L (ref 135–146)

## 2016-09-02 ENCOUNTER — Other Ambulatory Visit: Payer: Self-pay | Admitting: Family Medicine

## 2016-09-02 DIAGNOSIS — E876 Hypokalemia: Secondary | ICD-10-CM

## 2016-09-03 ENCOUNTER — Telehealth: Payer: Self-pay | Admitting: *Deleted

## 2016-09-03 NOTE — Telephone Encounter (Signed)
Call placed to patient. No answer. No VM.  

## 2016-09-03 NOTE — Telephone Encounter (Signed)
His potassium was very low when I last saw him, verify he has upped his potassium to 2 pills twice a day and has stopped metolazone.  May not be taking enough.

## 2016-09-03 NOTE — Telephone Encounter (Signed)
Pt has had 2 doses of the double potassium and did stop the metolazone.

## 2016-09-03 NOTE — Telephone Encounter (Signed)
Give the potassium some more time. His potassium levels were very low and this is likely causing his cramps. I will recheck his potassium level just a lab on Monday. If still low at that time we may need to increase potassium further

## 2016-09-03 NOTE — Telephone Encounter (Signed)
Received call from patient wife, Debbie [phone] telephone.   Reports that patient began increased dose of K+ on 09/02/2016.  States that patient is now voicing c/o of B lower leg cramps.   Advised that K+ should not cause cramping.   Patient wife denies any other medication changes or Sx.   MD please advise.

## 2016-09-04 NOTE — Telephone Encounter (Signed)
Patient's wife aware of recommendations and pt will stop by after work on Monday for potassium recheck

## 2016-09-07 ENCOUNTER — Other Ambulatory Visit: Payer: BLUE CROSS/BLUE SHIELD

## 2016-09-07 DIAGNOSIS — E876 Hypokalemia: Secondary | ICD-10-CM

## 2016-09-08 ENCOUNTER — Other Ambulatory Visit: Payer: Self-pay | Admitting: Family Medicine

## 2016-09-08 LAB — BASIC METABOLIC PANEL
BUN: 23 mg/dL (ref 7–25)
CHLORIDE: 98 mmol/L (ref 98–110)
CO2: 31 mmol/L (ref 20–31)
CREATININE: 1.11 mg/dL (ref 0.70–1.25)
Calcium: 8.7 mg/dL (ref 8.6–10.3)
GLUCOSE: 123 mg/dL — AB (ref 70–99)
Potassium: 4.4 mmol/L (ref 3.5–5.3)
Sodium: 135 mmol/L (ref 135–146)

## 2016-09-08 MED ORDER — POTASSIUM CHLORIDE CRYS ER 20 MEQ PO TBCR: EXTENDED_RELEASE_TABLET | ORAL | 11 refills | Status: DC

## 2016-09-22 ENCOUNTER — Other Ambulatory Visit: Payer: Self-pay | Admitting: Family Medicine

## 2016-09-30 ENCOUNTER — Institutional Professional Consult (permissible substitution): Payer: BLUE CROSS/BLUE SHIELD | Admitting: Neurology

## 2016-10-03 ENCOUNTER — Other Ambulatory Visit: Payer: Self-pay | Admitting: Family Medicine

## 2016-10-16 ENCOUNTER — Other Ambulatory Visit: Payer: BLUE CROSS/BLUE SHIELD

## 2016-10-16 DIAGNOSIS — Z79899 Other long term (current) drug therapy: Secondary | ICD-10-CM

## 2016-10-16 DIAGNOSIS — I4891 Unspecified atrial fibrillation: Secondary | ICD-10-CM

## 2016-10-16 DIAGNOSIS — I10 Essential (primary) hypertension: Secondary | ICD-10-CM

## 2016-10-16 DIAGNOSIS — E1165 Type 2 diabetes mellitus with hyperglycemia: Secondary | ICD-10-CM

## 2016-10-16 LAB — CBC WITH DIFFERENTIAL/PLATELET
Basophils Absolute: 73 cells/uL (ref 0–200)
Basophils Relative: 1 %
EOS PCT: 3 %
Eosinophils Absolute: 219 cells/uL (ref 15–500)
HCT: 46.2 % (ref 38.5–50.0)
Hemoglobin: 14.5 g/dL (ref 13.0–17.0)
LYMPHS ABS: 1460 {cells}/uL (ref 850–3900)
LYMPHS PCT: 20 %
MCH: 28.3 pg (ref 27.0–33.0)
MCHC: 31.4 g/dL — AB (ref 32.0–36.0)
MCV: 90.2 fL (ref 80.0–100.0)
MONOS PCT: 11 %
MPV: 9.8 fL (ref 7.5–12.5)
Monocytes Absolute: 803 cells/uL (ref 200–950)
NEUTROS PCT: 65 %
Neutro Abs: 4745 cells/uL (ref 1500–7800)
Platelets: 273 10*3/uL (ref 140–400)
RBC: 5.12 MIL/uL (ref 4.20–5.80)
RDW: 18.1 % — AB (ref 11.0–15.0)
WBC: 7.3 10*3/uL (ref 3.8–10.8)

## 2016-10-16 LAB — PT WITH INR/FINGERSTICK
INR, fingerstick: 2.3 — ABNORMAL HIGH (ref 0.80–1.20)
PT, fingerstick: 28 seconds — ABNORMAL HIGH (ref 10.4–12.5)

## 2016-10-17 LAB — COMPLETE METABOLIC PANEL WITH GFR
ALT: 10 U/L (ref 9–46)
AST: 13 U/L (ref 10–35)
Albumin: 3.7 g/dL (ref 3.6–5.1)
Alkaline Phosphatase: 88 U/L (ref 40–115)
BILIRUBIN TOTAL: 0.6 mg/dL (ref 0.2–1.2)
BUN: 16 mg/dL (ref 7–25)
CO2: 26 mmol/L (ref 20–31)
Calcium: 8.8 mg/dL (ref 8.6–10.3)
Chloride: 100 mmol/L (ref 98–110)
Creat: 0.95 mg/dL (ref 0.70–1.25)
GFR, EST NON AFRICAN AMERICAN: 85 mL/min (ref >=60)
GFR, Est African American: >89 mL/min (ref >=60)
GLUCOSE: 131 mg/dL — AB (ref 70–99)
Potassium: 4.4 mmol/L (ref 3.5–5.3)
SODIUM: 137 mmol/L (ref 135–146)
TOTAL PROTEIN: 7 g/dL (ref 6.1–8.1)

## 2016-10-17 LAB — LIPID PANEL
CHOL/HDL RATIO: 4.6 ratio (ref <5.0)
Cholesterol: 106 mg/dL (ref <200)
HDL: 23 mg/dL — AB (ref >40)
LDL CALC: 61 mg/dL (ref <100)
Triglycerides: 112 mg/dL (ref <150)
VLDL: 22 mg/dL (ref <30)

## 2016-10-17 LAB — HEMOGLOBIN A1C
HEMOGLOBIN A1C: 6.5 % — AB (ref <5.7)
MEAN PLASMA GLUCOSE: 140 mg/dL

## 2016-11-20 ENCOUNTER — Encounter: Payer: Self-pay | Admitting: Family Medicine

## 2016-11-20 ENCOUNTER — Ambulatory Visit (INDEPENDENT_AMBULATORY_CARE_PROVIDER_SITE_OTHER): Payer: BLUE CROSS/BLUE SHIELD | Admitting: Family Medicine

## 2016-11-20 DIAGNOSIS — I4891 Unspecified atrial fibrillation: Secondary | ICD-10-CM

## 2016-11-20 LAB — PT WITH INR/FINGERSTICK
INR, fingerstick: 2.2 — ABNORMAL HIGH (ref 0.80–1.20)
PT FINGERSTICK: 26.9 s — AB (ref 10.4–12.5)

## 2016-11-23 NOTE — Progress Notes (Signed)
Subjective:    Patient ID: William Alvarado, male    DOB: Nov 19, 1950, 66 y.o.   MRN: [id number]  HPI Office Visit on 11/20/2016  Component Date Value Ref Range Status  . PT, fingerstick 11/20/2016 26.9* 10.4 - 12.5 seconds Final  . INR, fingerstick 11/20/2016 2.2* 0.80 - 1.20 Final   Comment: The INR is of principal utility in following patients on stable doses of oral anticoagulants. The therapeutic range is generally 2.0 to 3.0, but may be 3.0 to 4.0 in patients with mechanical cardiac valves, recurrent embolisms and antiphospholipid antibodies (including lupus inhibitors).   INRs >2.9 may be falsely elevated in patients receiving either unfractionated Heparin or Low Molecular Weight Heparin. Follow up testing in a hospital laboratory may be helpful if clinically indicated.     A she is here today to recheck his INR, which is therapeutic. He denies any bleeding or bruising. His wife also mentions that he is getting increasingly anxious about work. His back, his legs, and his feet hurt every day after a long 8 hour shift. He is limited by his breathing. He has decided that he will retire in 2 months. However every Monday and Tuesday, he has diarrhea which his wife attributes to anxiety. This improves later in the week. In fact he has missed several days of work due to the diarrhea and the anxiety about going to work. Past Medical History:  Diagnosis Date  . Allergy    allergic rhinitis  . Atrial fibrillation (HCC)   . Colon polyps   . COPD (chronic obstructive pulmonary disease) (HCC)   . Degenerative disc disease, lumbar   . Diabetes mellitus without complication (HCC)   . Hypertension   . Systolic murmur    Past Surgical History:  Procedure Laterality Date  . CARDIOVERSION N/A 01/09/2013   Procedure: CARDIOVERSION;  Surgeon: Pricilla Riffle, MD;  Location: [facility] ENDOSCOPY;  Service: Cardiovascular;  Laterality: N/A;  . TEE WITHOUT CARDIOVERSION N/A 01/09/2013   Procedure:  TRANSESOPHAGEAL ECHOCARDIOGRAM (TEE);  Surgeon: Pricilla Riffle, MD;  Location: [facility] ENDOSCOPY;  Service: Cardiovascular;  Laterality: N/A;   Current Outpatient Prescriptions on File Prior to Visit  Medication Sig Dispense Refill  . albuterol (PROVENTIL HFA;VENTOLIN HFA) 108 (90 Base) MCG/ACT inhaler Inhale 2 puffs into the lungs every 6 (six) hours as needed for wheezing or shortness of breath. 1 Inhaler 0  . fish oil-omega-3 fatty acids 1000 MG capsule Take 1 g by mouth daily.    . furosemide (LASIX) 40 MG tablet Take 2 every morning, 1 every afternoon. 90 tablet 0  . gabapentin (NEURONTIN) 300 MG capsule Take 1 capsule (300 mg total) by mouth at bedtime. 30 capsule 3  . metFORMIN (GLUCOPHAGE) 1000 MG tablet TAKE ONE TABLET BY MOUTH TWICE DAILY 60 tablet 5  . metoprolol (LOPRESSOR) 50 MG tablet TAKE ONE TABLET BY MOUTH TWICE DAILY 60 tablet 11  . potassium chloride SA (KLOR-CON M20) 20 MEQ tablet 2 tabs po bid 120 tablet 11  . warfarin (COUMADIN) 5 MG tablet TAKE AS DIRECTED 90 tablet 5   No current facility-administered medications on file prior to visit.    No Known Allergies Social History   Social History  . Marital status: Married    Spouse name: N/A  . Number of children: 2  . Years of education: N/A   Occupational History  .      Machinist   Social History Main Topics  . Smoking status: Former Smoker  Packs/day: 1.50    Types: Cigarettes    Quit date: 08/18/2016  . Smokeless tobacco: Never Used  . Alcohol use No  . Drug use: No  . Sexual activity: Not Currently   Other Topics Concern  . Not on file   Social History Narrative  . No narrative on file       Review of Systems  All other systems reviewed and are negative.      Objective:   Physical Exam  Constitutional: He appears well-developed and well-nourished.  Neck: No JVD present.  Cardiovascular: Normal rate and normal heart sounds.   Pulmonary/Chest: Effort normal. He has wheezes.  Abdominal: Soft.  Bowel sounds are normal.  Musculoskeletal: He exhibits edema.  Vitals reviewed.         Assessment & Plan:  Atrial fibrillation (HCC) INR is therapeutic. Continue to encourage smoking cessation. We'll make no adjustment in his Coumadin dose. Recheck INR in 6 weeks. Offer the patient Xanax to be used as needed for anxiety. Patient believes he will be fine once he retires in 2 weeks at the present time he is not interested in any medication for situational anxiety.

## 2016-12-07 ENCOUNTER — Encounter: Payer: Self-pay | Admitting: Family Medicine

## 2016-12-07 ENCOUNTER — Ambulatory Visit (INDEPENDENT_AMBULATORY_CARE_PROVIDER_SITE_OTHER): Payer: BLUE CROSS/BLUE SHIELD | Admitting: Family Medicine

## 2016-12-07 VITALS — BP 130/78 | HR 88 | Temp 98.1°F | Resp 22 | Ht 73.0 in | Wt 295.0 lb

## 2016-12-07 DIAGNOSIS — J029 Acute pharyngitis, unspecified: Secondary | ICD-10-CM

## 2016-12-07 LAB — STREP GROUP A AG, W/REFLEX TO CULT: STREGTOCOCCUS GROUP A AG SCREEN: DETECTED — AB

## 2016-12-07 MED ORDER — AMOXICILLIN 875 MG PO TABS: 875.0000 mg | ORAL_TABLET | Freq: Two times a day (BID) | ORAL | 0 refills | Status: DC

## 2016-12-07 NOTE — Progress Notes (Signed)
Subjective:    Patient ID: William Alvarado, male    DOB: 1951/04/10, 67 y.o.   MRN: [id number]  HPI Began 3 days ago with sore throat.  Feels like he is swallowing "razors."  Getting worse.  Denies fever but developed swollen painful right ant cervical lymphadenopathy yesterday.  Minimal cough, PND, rhinorrhea.   Past Medical History:  Diagnosis Date  . Allergy    allergic rhinitis  . Atrial fibrillation (HCC)   . Colon polyps   . COPD (chronic obstructive pulmonary disease) (HCC)   . Degenerative disc disease, lumbar   . Diabetes mellitus without complication (HCC)   . Hypertension   . Systolic murmur    Past Surgical History:  Procedure Laterality Date  . CARDIOVERSION N/A 01/09/2013   Procedure: CARDIOVERSION;  Surgeon: Pricilla Riffle, MD;  Location: [facility] ENDOSCOPY;  Service: Cardiovascular;  Laterality: N/A;  . TEE WITHOUT CARDIOVERSION N/A 01/09/2013   Procedure: TRANSESOPHAGEAL ECHOCARDIOGRAM (TEE);  Surgeon: Pricilla Riffle, MD;  Location: [facility] ENDOSCOPY;  Service: Cardiovascular;  Laterality: N/A;   Current Outpatient Prescriptions on File Prior to Visit  Medication Sig Dispense Refill  . albuterol (PROVENTIL HFA;VENTOLIN HFA) 108 (90 Base) MCG/ACT inhaler Inhale 2 puffs into the lungs every 6 (six) hours as needed for wheezing or shortness of breath. 1 Inhaler 0  . fish oil-omega-3 fatty acids 1000 MG capsule Take 1 g by mouth daily.    . furosemide (LASIX) 40 MG tablet Take 2 every morning, 1 every afternoon. 90 tablet 0  . gabapentin (NEURONTIN) 300 MG capsule Take 1 capsule (300 mg total) by mouth at bedtime. 30 capsule 3  . metFORMIN (GLUCOPHAGE) 1000 MG tablet TAKE ONE TABLET BY MOUTH TWICE DAILY 60 tablet 5  . metoprolol (LOPRESSOR) 50 MG tablet TAKE ONE TABLET BY MOUTH TWICE DAILY 60 tablet 11  . potassium chloride SA (KLOR-CON M20) 20 MEQ tablet 2 tabs po bid 120 tablet 11  . warfarin (COUMADIN) 5 MG tablet TAKE AS DIRECTED 90 tablet 5   No current facility-administered  medications on file prior to visit.    No Known Allergies Social History   Social History  . Marital status: Married    Spouse name: N/A  . Number of children: 2  . Years of education: N/A   Occupational History  .      Machinist   Social History Main Topics  . Smoking status: Former Smoker    Packs/day: 1.50    Types: Cigarettes    Quit date: 08/18/2016  . Smokeless tobacco: Never Used  . Alcohol use No  . Drug use: No  . Sexual activity: Not Currently   Other Topics Concern  . Not on file   Social History Narrative  . No narrative on file      Review of Systems  HENT: Positive for sore throat and trouble swallowing. Negative for congestion, ear discharge, ear pain, facial swelling, postnasal drip, rhinorrhea, sinus pain and sinus pressure.   Eyes: Negative.   Respiratory: Negative.        Objective:   Physical Exam  Constitutional: He appears well-developed and well-nourished. No distress.  HENT:  Right Ear: External ear normal.  Left Ear: External ear normal.  Nose: Nose normal.  Mouth/Throat: Posterior oropharyngeal edema and posterior oropharyngeal erythema present. No oropharyngeal exudate.  Cardiovascular: Normal rate and normal heart sounds.   Pulmonary/Chest: Effort normal and breath sounds normal. No respiratory distress. He has no wheezes. He has no rales.  Abdominal:  Soft. Bowel sounds are normal.  Lymphadenopathy:    He has cervical adenopathy.  Skin: He is not diaphoretic.  Vitals reviewed.         Assessment & Plan:  Sore throat - Plan: STREP GROUP A AG, W/REFLEX TO CULT  Based on exam, I suspect strep throat.  Amoxicillin 875 mg pobid x 10 days.

## 2017-01-15 ENCOUNTER — Ambulatory Visit: Payer: BLUE CROSS/BLUE SHIELD | Admitting: Family Medicine

## 2017-01-22 ENCOUNTER — Encounter: Payer: Self-pay | Admitting: Family Medicine

## 2017-01-22 ENCOUNTER — Ambulatory Visit (INDEPENDENT_AMBULATORY_CARE_PROVIDER_SITE_OTHER): Payer: BLUE CROSS/BLUE SHIELD | Admitting: Family Medicine

## 2017-01-22 VITALS — BP 110/72 | HR 84 | Temp 98.3°F | Resp 22 | Ht 73.0 in | Wt 316.0 lb

## 2017-01-22 DIAGNOSIS — I5033 Acute on chronic diastolic (congestive) heart failure: Secondary | ICD-10-CM

## 2017-01-22 DIAGNOSIS — I10 Essential (primary) hypertension: Secondary | ICD-10-CM | POA: Diagnosis not present

## 2017-01-22 DIAGNOSIS — R011 Cardiac murmur, unspecified: Secondary | ICD-10-CM

## 2017-01-22 DIAGNOSIS — R601 Generalized edema: Secondary | ICD-10-CM

## 2017-01-22 DIAGNOSIS — R0989 Other specified symptoms and signs involving the circulatory and respiratory systems: Secondary | ICD-10-CM | POA: Diagnosis not present

## 2017-01-22 DIAGNOSIS — E119 Type 2 diabetes mellitus without complications: Secondary | ICD-10-CM | POA: Diagnosis not present

## 2017-01-22 LAB — CBC WITH DIFFERENTIAL/PLATELET
BASOS PCT: 1 %
Basophils Absolute: 82 cells/uL (ref 0–200)
EOS ABS: 164 {cells}/uL (ref 15–500)
Eosinophils Relative: 2 %
HEMATOCRIT: 47.4 % (ref 38.5–50.0)
HEMOGLOBIN: 14.5 g/dL (ref 13.0–17.0)
Lymphocytes Relative: 17 %
Lymphs Abs: 1394 cells/uL (ref 850–3900)
MCH: 27.7 pg (ref 27.0–33.0)
MCHC: 30.6 g/dL — ABNORMAL LOW (ref 32.0–36.0)
MCV: 90.6 fL (ref 80.0–100.0)
MONO ABS: 984 {cells}/uL — AB (ref 200–950)
MPV: 9.3 fL (ref 7.5–12.5)
Monocytes Relative: 12 %
NEUTROS ABS: 5576 {cells}/uL (ref 1500–7800)
Neutrophils Relative %: 68 %
Platelets: 355 10*3/uL (ref 140–400)
RBC: 5.23 MIL/uL (ref 4.20–5.80)
RDW: 18.3 % — ABNORMAL HIGH (ref 11.0–15.0)
WBC: 8.2 10*3/uL (ref 3.8–10.8)

## 2017-01-22 MED ORDER — METOLAZONE 5 MG PO TABS: 5.0000 mg | ORAL_TABLET | Freq: Every day | ORAL | 0 refills | Status: DC

## 2017-01-22 NOTE — Progress Notes (Signed)
Subjective:    Patient ID: William Alvarado, male    DOB: 1950-10-18, 66 y.o.   MRN: [id number]  HPI Since I last saw the patient, he has gained approximately 21 pounds in less than a month. He has +2 pitting edema in both legs and increasing shortness of breath. He has been taking Lasix 40 mg in the morning and 80 mg in the evening for the last week with no improvement. He now has developed a weeping superficial venous stasis ulcer on the anterior right shin is approximately the size of a quarter. It is weeping clear serous fluid. Surrounding this are punctate small weeping blisters. He is also due for a recheck of his diabetes. He denies any polyuria, polydipsia, or blurry vision. He denies any hypoglycemia. He denies any chest pain but he does endorse shortness of breath. He also reports orthopnea but he attributed this to obesity. On examination today of his legs, he has +2 pitting edema in both legs distal to the knee. He has the venous stasis ulcer described above. He also has feet that are violaceous in color.  There are faint palpable dorsalis pedis pulses bilaterally.  The second third and fourth toes on the right foot are violaceous and swollen but I'm not sure if this is due to dependent edema. There are no ischemic ulcers. Past Medical History:  Diagnosis Date  . Allergy    allergic rhinitis  . Atrial fibrillation (HCC)   . Colon polyps   . COPD (chronic obstructive pulmonary disease) (HCC)   . Degenerative disc disease, lumbar   . Diabetes mellitus without complication (HCC)   . Hypertension   . Systolic murmur    Past Surgical History:  Procedure Laterality Date  . CARDIOVERSION N/A 01/09/2013   Procedure: CARDIOVERSION;  Surgeon: Pricilla Riffle, MD;  Location: [facility] ENDOSCOPY;  Service: Cardiovascular;  Laterality: N/A;  . TEE WITHOUT CARDIOVERSION N/A 01/09/2013   Procedure: TRANSESOPHAGEAL ECHOCARDIOGRAM (TEE);  Surgeon: Pricilla Riffle, MD;  Location: [facility] ENDOSCOPY;  Service:  Cardiovascular;  Laterality: N/A;   Current Outpatient Prescriptions on File Prior to Visit  Medication Sig Dispense Refill  . albuterol (PROVENTIL HFA;VENTOLIN HFA) 108 (90 Base) MCG/ACT inhaler Inhale 2 puffs into the lungs every 6 (six) hours as needed for wheezing or shortness of breath. 1 Inhaler 0  . fish oil-omega-3 fatty acids 1000 MG capsule Take 1 g by mouth daily.    . furosemide (LASIX) 40 MG tablet Take 2 every morning, 1 every afternoon. 90 tablet 0  . gabapentin (NEURONTIN) 300 MG capsule Take 1 capsule (300 mg total) by mouth at bedtime. 30 capsule 3  . metFORMIN (GLUCOPHAGE) 1000 MG tablet TAKE ONE TABLET BY MOUTH TWICE DAILY 60 tablet 5  . metoprolol (LOPRESSOR) 50 MG tablet TAKE ONE TABLET BY MOUTH TWICE DAILY 60 tablet 11  . potassium chloride SA (KLOR-CON M20) 20 MEQ tablet 2 tabs po bid 120 tablet 11  . warfarin (COUMADIN) 5 MG tablet TAKE AS DIRECTED 90 tablet 5   No current facility-administered medications on file prior to visit.    No Known Allergies Social History   Social History  . Marital status: Married    Spouse name: N/A  . Number of children: 2  . Years of education: N/A   Occupational History  .      Machinist   Social History Main Topics  . Smoking status: Former Smoker    Packs/day: 1.50    Types: Cigarettes  Quit date: 08/18/2016  . Smokeless tobacco: Never Used  . Alcohol use No  . Drug use: No  . Sexual activity: Not Currently   Other Topics Concern  . Not on file   Social History Narrative  . No narrative on file       Review of Systems  All other systems reviewed and are negative.      Objective:   Physical Exam  Constitutional: He appears well-developed and well-nourished.  Neck: No JVD present.  Cardiovascular: Normal rate and normal heart sounds.   Pulmonary/Chest: Effort normal. He has wheezes.  Abdominal: Soft. Bowel sounds are normal.  Musculoskeletal: He exhibits edema.  Vitals reviewed.           Assessment & Plan:  Controlled type 2 diabetes mellitus without complication, without long-term current use of insulin (HCC) - Plan: CBC with Differential/Platelet, COMPLETE METABOLIC PANEL WITH GFR, Lipid panel, Microalbumin, urine, Hemoglobin A1c  Acute on chronic diastolic heart failure (HCC)  Essential hypertension  Systolic murmur  Generalized edema  Increase Lasix to 80 mg by mouth twice a day. Begin Zaroxolyn 5 mg by mouth every morning 30 minutes before his morning dose of Lasix. Recheck here on Monday. Obtain CBC, CMP, fasting lipid panel, and hemoglobin A1c. Goal hemoglobin A1c is less than 6.5. Wound on the anterior right shin was dressed with a nonadherent gauze and then placed in an Unna boot. We'll recheck the wound on Monday. More than 30 minutes was spent with the patient today I will schedule the patient for arterial Dopplers of both legs to evaluate for peripheral vascular disease given his physical exam findings and his smoking

## 2017-01-23 LAB — HEMOGLOBIN A1C
HEMOGLOBIN A1C: 6.3 % — AB (ref <5.7)
MEAN PLASMA GLUCOSE: 134 mg/dL

## 2017-01-23 LAB — COMPLETE METABOLIC PANEL WITH GFR
ALT: 8 U/L — AB (ref 9–46)
AST: 14 U/L (ref 10–35)
Albumin: 3.5 g/dL — ABNORMAL LOW (ref 3.6–5.1)
Alkaline Phosphatase: 88 U/L (ref 40–115)
BILIRUBIN TOTAL: 0.6 mg/dL (ref 0.2–1.2)
BUN: 18 mg/dL (ref 7–25)
CALCIUM: 8.7 mg/dL (ref 8.6–10.3)
CO2: 28 mmol/L (ref 20–31)
CREATININE: 1.09 mg/dL (ref 0.70–1.25)
Chloride: 98 mmol/L (ref 98–110)
GFR, EST AFRICAN AMERICAN: 83 mL/min (ref >=60)
GFR, Est Non African American: 72 mL/min (ref >=60)
Glucose, Bld: 129 mg/dL — ABNORMAL HIGH (ref 70–99)
Potassium: 4.4 mmol/L (ref 3.5–5.3)
Sodium: 136 mmol/L (ref 135–146)
TOTAL PROTEIN: 7.7 g/dL (ref 6.1–8.1)

## 2017-01-23 LAB — LIPID PANEL
CHOL/HDL RATIO: 4 ratio (ref <5.0)
CHOLESTEROL: 93 mg/dL (ref <200)
HDL: 23 mg/dL — AB (ref >40)
LDL Cholesterol: 53 mg/dL (ref <100)
Triglycerides: 83 mg/dL (ref <150)
VLDL: 17 mg/dL (ref <30)

## 2017-01-23 LAB — MICROALBUMIN, URINE: Microalb, Ur: 6.4 mg/dL

## 2017-01-25 ENCOUNTER — Encounter: Payer: Self-pay | Admitting: Family Medicine

## 2017-01-25 ENCOUNTER — Ambulatory Visit (INDEPENDENT_AMBULATORY_CARE_PROVIDER_SITE_OTHER): Payer: BLUE CROSS/BLUE SHIELD | Admitting: Family Medicine

## 2017-01-25 VITALS — BP 128/74 | HR 86 | Temp 97.5°F | Resp 22 | Ht 73.0 in | Wt 303.0 lb

## 2017-01-25 DIAGNOSIS — R601 Generalized edema: Secondary | ICD-10-CM | POA: Diagnosis not present

## 2017-01-25 NOTE — Progress Notes (Signed)
Subjective:    Patient ID: William Alvarado, male    DOB: 10-28-1950, 66 y.o.   MRN: [id number]  Medication Refill   01/22/17 Since I last saw the patient, he has gained approximately 21 pounds in less than a month. He has +2 pitting edema in both legs and increasing shortness of breath. He has been taking Lasix 40 mg in the morning and 80 mg in the evening for the last week with no improvement. He now has developed a weeping superficial venous stasis ulcer on the anterior right shin is approximately the size of a quarter. It is weeping clear serous fluid. Surrounding this are punctate small weeping blisters. He is also due for a recheck of his diabetes. He denies any polyuria, polydipsia, or blurry vision. He denies any hypoglycemia. He denies any chest pain but he does endorse shortness of breath. He also reports orthopnea but he attributed this to obesity. On examination today of his legs, he has +2 pitting edema in both legs distal to the knee. He has the venous stasis ulcer described above. He also has feet that are violaceous in color.  There are faint palpable dorsalis pedis pulses bilaterally.  The second third and fourth toes on the right foot are violaceous and swollen but I'm not sure if this is due to dependent edema. There are no ischemic ulcers.  At that time, my plan was: Increase Lasix to 80 mg by mouth twice a day. Begin Zaroxolyn 5 mg by mouth every morning 30 minutes before his morning dose of Lasix. Recheck here on Monday. Obtain CBC, CMP, fasting lipid panel, and hemoglobin A1c. Goal hemoglobin A1c is less than 6.5. Wound on the anterior right shin was dressed with a nonadherent gauze and then placed in an Unna boot. We'll recheck the wound on Monday. More than 30 minutes was spent with the patient today I will schedule the patient for arterial Dopplers of both legs to evaluate for peripheral vascular disease given his physical exam findings and his smoking  01/25/17 Patient has lost  13 pounds since last week. The swelling in his legs is much better. However he is still not back to his dry weight. His dry weight is 297 pounds. He has a proximally 6 pounds to go. He is diuresing approximately 6 pounds every day using Zaroxolyn as a primer prior to Lasix.  Patient had been doing Zaroxolyn once a week in December when he was stable but sometime in the last 2 months he discontinued the medication for reasons he is not certain why. Past Medical History:  Diagnosis Date  . Allergy    allergic rhinitis  . Atrial fibrillation (HCC)   . Colon polyps   . COPD (chronic obstructive pulmonary disease) (HCC)   . Degenerative disc disease, lumbar   . Diabetes mellitus without complication (HCC)   . Hypertension   . Systolic murmur    Past Surgical History:  Procedure Laterality Date  . CARDIOVERSION N/A 01/09/2013   Procedure: CARDIOVERSION;  Surgeon: Pricilla Riffle, MD;  Location: [facility] ENDOSCOPY;  Service: Cardiovascular;  Laterality: N/A;  . TEE WITHOUT CARDIOVERSION N/A 01/09/2013   Procedure: TRANSESOPHAGEAL ECHOCARDIOGRAM (TEE);  Surgeon: Pricilla Riffle, MD;  Location: [facility] ENDOSCOPY;  Service: Cardiovascular;  Laterality: N/A;   Current Outpatient Prescriptions on File Prior to Visit  Medication Sig Dispense Refill  . albuterol (PROVENTIL HFA;VENTOLIN HFA) 108 (90 Base) MCG/ACT inhaler Inhale 2 puffs into the lungs every 6 (six) hours as needed for  wheezing or shortness of breath. 1 Inhaler 0  . fish oil-omega-3 fatty acids 1000 MG capsule Take 1 g by mouth daily.    . furosemide (LASIX) 40 MG tablet Take 2 every morning, 1 every afternoon. 90 tablet 0  . gabapentin (NEURONTIN) 300 MG capsule Take 1 capsule (300 mg total) by mouth at bedtime. 30 capsule 3  . metFORMIN (GLUCOPHAGE) 1000 MG tablet TAKE ONE TABLET BY MOUTH TWICE DAILY 60 tablet 5  . metolazone (ZAROXOLYN) 5 MG tablet Take 1 tablet (5 mg total) by mouth daily. Take 1 pill 30 minutes before lasix in am 30 tablet 0  .  metoprolol (LOPRESSOR) 50 MG tablet TAKE ONE TABLET BY MOUTH TWICE DAILY 60 tablet 11  . potassium chloride SA (KLOR-CON M20) 20 MEQ tablet 2 tabs po bid 120 tablet 11  . warfarin (COUMADIN) 5 MG tablet TAKE AS DIRECTED 90 tablet 5   No current facility-administered medications on file prior to visit.    No Known Allergies Social History   Social History  . Marital status: Married    Spouse name: N/A  . Number of children: 2  . Years of education: N/A   Occupational History  .      Machinist   Social History Main Topics  . Smoking status: Former Smoker    Packs/day: 1.50    Types: Cigarettes    Quit date: 08/18/2016  . Smokeless tobacco: Never Used  . Alcohol use No  . Drug use: No  . Sexual activity: Not Currently   Other Topics Concern  . Not on file   Social History Narrative  . No narrative on file       Review of Systems  All other systems reviewed and are negative.      Objective:   Physical Exam  Constitutional: He appears well-developed and well-nourished.  Neck: No JVD present.  Cardiovascular: Normal rate and normal heart sounds.   Pulmonary/Chest: Effort normal. He has wheezes.  Abdominal: Soft. Bowel sounds are normal.  Musculoskeletal: He exhibits edema.  Vitals reviewed.         Assessment & Plan:  Generalized edema  Continue Zaroxolyn as a primer through tomorrow. Continue Lasix 80 mg twice a day through tomorrow. After that decrease to 80 mg in the morning and 40 mg in the evening as we are doing previously. Resume Zaroxolyn 5 mg once a week on Mondays as a primer as this was working very well to control his weight and his edema. He needs to monitor his weight every day and if he sees that this is not working, we may need to increase the frequency at which he uses the [employer].

## 2017-01-26 ENCOUNTER — Other Ambulatory Visit: Payer: Self-pay | Admitting: Family Medicine

## 2017-01-26 DIAGNOSIS — R0989 Other specified symptoms and signs involving the circulatory and respiratory systems: Secondary | ICD-10-CM

## 2017-01-28 ENCOUNTER — Other Ambulatory Visit: Payer: Self-pay | Admitting: Family Medicine

## 2017-01-28 DIAGNOSIS — R0989 Other specified symptoms and signs involving the circulatory and respiratory systems: Secondary | ICD-10-CM

## 2017-02-19 ENCOUNTER — Telehealth: Payer: Self-pay | Admitting: Family Medicine

## 2017-02-20 ENCOUNTER — Other Ambulatory Visit: Payer: Self-pay | Admitting: Family Medicine

## 2017-02-22 ENCOUNTER — Telehealth: Payer: Self-pay | Admitting: *Deleted

## 2017-02-22 ENCOUNTER — Other Ambulatory Visit: Payer: Self-pay | Admitting: *Deleted

## 2017-02-22 DIAGNOSIS — R0989 Other specified symptoms and signs involving the circulatory and respiratory systems: Secondary | ICD-10-CM

## 2017-02-22 NOTE — Telephone Encounter (Signed)
Patient had issue with insurance which has been resolved. Still needs doppler studies done. New order put in.

## 2017-03-03 ENCOUNTER — Other Ambulatory Visit: Payer: Self-pay | Admitting: Family Medicine

## 2017-03-03 DIAGNOSIS — I2609 Other pulmonary embolism with acute cor pulmonale: Secondary | ICD-10-CM

## 2017-03-10 ENCOUNTER — Emergency Department (HOSPITAL_COMMUNITY): Payer: BLUE CROSS/BLUE SHIELD

## 2017-03-10 ENCOUNTER — Encounter (HOSPITAL_COMMUNITY): Payer: Self-pay | Admitting: Emergency Medicine

## 2017-03-10 ENCOUNTER — Inpatient Hospital Stay (HOSPITAL_COMMUNITY)
Admission: EM | Admit: 2017-03-10 | Discharge: 2017-03-15 | DRG: 871 | Disposition: A | Discharge status: Home / Self Care | Payer: BLUE CROSS/BLUE SHIELD | Attending: Internal Medicine | Admitting: Internal Medicine

## 2017-03-10 ENCOUNTER — Other Ambulatory Visit: Payer: Self-pay

## 2017-03-10 ENCOUNTER — Ambulatory Visit (INDEPENDENT_AMBULATORY_CARE_PROVIDER_SITE_OTHER)
Admission: RE | Admit: 2017-03-10 | Discharge: 2017-03-10 | Disposition: A | Discharge status: Home / Self Care | Payer: BLUE CROSS/BLUE SHIELD | Source: Ambulatory Visit | Attending: Vascular Surgery | Admitting: Vascular Surgery

## 2017-03-10 ENCOUNTER — Ambulatory Visit (INDEPENDENT_AMBULATORY_CARE_PROVIDER_SITE_OTHER): Payer: BLUE CROSS/BLUE SHIELD | Admitting: Physician Assistant

## 2017-03-10 VITALS — HR 164 | Temp 98.9°F | Resp 18 | Wt 288.8 lb

## 2017-03-10 DIAGNOSIS — I878 Other specified disorders of veins: Secondary | ICD-10-CM | POA: Diagnosis present

## 2017-03-10 DIAGNOSIS — I5033 Acute on chronic diastolic (congestive) heart failure: Secondary | ICD-10-CM | POA: Diagnosis present

## 2017-03-10 DIAGNOSIS — I2609 Other pulmonary embolism with acute cor pulmonale: Secondary | ICD-10-CM | POA: Diagnosis present

## 2017-03-10 DIAGNOSIS — F17210 Nicotine dependence, cigarettes, uncomplicated: Secondary | ICD-10-CM | POA: Diagnosis present

## 2017-03-10 DIAGNOSIS — I340 Nonrheumatic mitral (valve) insufficiency: Secondary | ICD-10-CM | POA: Diagnosis present

## 2017-03-10 DIAGNOSIS — E1151 Type 2 diabetes mellitus with diabetic peripheral angiopathy without gangrene: Secondary | ICD-10-CM | POA: Diagnosis present

## 2017-03-10 DIAGNOSIS — I482 Chronic atrial fibrillation, unspecified: Secondary | ICD-10-CM

## 2017-03-10 DIAGNOSIS — Z79899 Other long term (current) drug therapy: Secondary | ICD-10-CM

## 2017-03-10 DIAGNOSIS — J181 Lobar pneumonia, unspecified organism: Secondary | ICD-10-CM

## 2017-03-10 DIAGNOSIS — E860 Dehydration: Secondary | ICD-10-CM | POA: Diagnosis present

## 2017-03-10 DIAGNOSIS — J440 Chronic obstructive pulmonary disease with acute lower respiratory infection: Secondary | ICD-10-CM | POA: Diagnosis present

## 2017-03-10 DIAGNOSIS — J189 Pneumonia, unspecified organism: Secondary | ICD-10-CM

## 2017-03-10 DIAGNOSIS — J9601 Acute respiratory failure with hypoxia: Secondary | ICD-10-CM | POA: Diagnosis present

## 2017-03-10 DIAGNOSIS — E119 Type 2 diabetes mellitus without complications: Secondary | ICD-10-CM

## 2017-03-10 DIAGNOSIS — Z7901 Long term (current) use of anticoagulants: Secondary | ICD-10-CM

## 2017-03-10 DIAGNOSIS — R4182 Altered mental status, unspecified: Secondary | ICD-10-CM | POA: Diagnosis not present

## 2017-03-10 DIAGNOSIS — N179 Acute kidney failure, unspecified: Secondary | ICD-10-CM

## 2017-03-10 DIAGNOSIS — R0602 Shortness of breath: Secondary | ICD-10-CM

## 2017-03-10 DIAGNOSIS — E871 Hypo-osmolality and hyponatremia: Secondary | ICD-10-CM | POA: Diagnosis present

## 2017-03-10 DIAGNOSIS — I10 Essential (primary) hypertension: Secondary | ICD-10-CM

## 2017-03-10 DIAGNOSIS — A419 Sepsis, unspecified organism: Principal | ICD-10-CM | POA: Diagnosis present

## 2017-03-10 DIAGNOSIS — I110 Hypertensive heart disease with heart failure: Secondary | ICD-10-CM | POA: Diagnosis present

## 2017-03-10 DIAGNOSIS — M1990 Unspecified osteoarthritis, unspecified site: Secondary | ICD-10-CM | POA: Diagnosis present

## 2017-03-10 DIAGNOSIS — G9340 Encephalopathy, unspecified: Secondary | ICD-10-CM | POA: Diagnosis present

## 2017-03-10 DIAGNOSIS — Z7984 Long term (current) use of oral hypoglycemic drugs: Secondary | ICD-10-CM

## 2017-03-10 DIAGNOSIS — R0902 Hypoxemia: Secondary | ICD-10-CM

## 2017-03-10 DIAGNOSIS — I4891 Unspecified atrial fibrillation: Secondary | ICD-10-CM | POA: Diagnosis not present

## 2017-03-10 DIAGNOSIS — E876 Hypokalemia: Secondary | ICD-10-CM | POA: Diagnosis not present

## 2017-03-10 DIAGNOSIS — J441 Chronic obstructive pulmonary disease with (acute) exacerbation: Secondary | ICD-10-CM | POA: Diagnosis present

## 2017-03-10 DIAGNOSIS — R0989 Other specified symptoms and signs involving the circulatory and respiratory systems: Secondary | ICD-10-CM

## 2017-03-10 DIAGNOSIS — I480 Paroxysmal atrial fibrillation: Secondary | ICD-10-CM

## 2017-03-10 DIAGNOSIS — R0609 Other forms of dyspnea: Secondary | ICD-10-CM | POA: Diagnosis not present

## 2017-03-10 DIAGNOSIS — Z86711 Personal history of pulmonary embolism: Secondary | ICD-10-CM | POA: Diagnosis not present

## 2017-03-10 DIAGNOSIS — R0600 Dyspnea, unspecified: Secondary | ICD-10-CM

## 2017-03-10 DIAGNOSIS — R011 Cardiac murmur, unspecified: Secondary | ICD-10-CM | POA: Diagnosis present

## 2017-03-10 HISTORY — DX: Pneumonia, unspecified organism: J18.9

## 2017-03-10 HISTORY — DX: Sepsis, unspecified organism: A41.9

## 2017-03-10 HISTORY — DX: Type 2 diabetes mellitus without complications: E11.9

## 2017-03-10 LAB — COMPREHENSIVE METABOLIC PANEL
ALT: 15 U/L — ABNORMAL LOW (ref 17–63)
ANION GAP: 12 (ref 5–15)
AST: 27 U/L (ref 15–41)
Albumin: 3.8 g/dL (ref 3.5–5.0)
Alkaline Phosphatase: 67 U/L (ref 38–126)
BUN: 22 mg/dL — ABNORMAL HIGH (ref 6–20)
CALCIUM: 9 mg/dL (ref 8.9–10.3)
CHLORIDE: 93 mmol/L — AB (ref 101–111)
CO2: 26 mmol/L (ref 22–32)
Creatinine, Ser: 1.31 mg/dL — ABNORMAL HIGH (ref 0.61–1.24)
GFR calc non Af Amer: 56 mL/min — ABNORMAL LOW (ref >60)
Glucose, Bld: 165 mg/dL — ABNORMAL HIGH (ref 65–99)
POTASSIUM: 3.6 mmol/L (ref 3.5–5.1)
SODIUM: 131 mmol/L — AB (ref 135–145)
Total Bilirubin: 1.5 mg/dL — ABNORMAL HIGH (ref 0.3–1.2)
Total Protein: 8.1 g/dL (ref 6.5–8.1)

## 2017-03-10 LAB — CBC WITH DIFFERENTIAL/PLATELET
BASOS ABS: 0 10*3/uL (ref 0.0–0.1)
BASOS PCT: 0 %
Eosinophils Absolute: 0 10*3/uL (ref 0.0–0.7)
Eosinophils Relative: 0 %
HEMATOCRIT: 45.5 % (ref 39.0–52.0)
HEMOGLOBIN: 14.7 g/dL (ref 13.0–17.0)
Lymphocytes Relative: 4 %
Lymphs Abs: 0.8 10*3/uL (ref 0.7–4.0)
MCH: 28.7 pg (ref 26.0–34.0)
MCHC: 32.3 g/dL (ref 30.0–36.0)
MCV: 88.9 fL (ref 78.0–100.0)
Monocytes Absolute: 1.9 10*3/uL — ABNORMAL HIGH (ref 0.1–1.0)
Monocytes Relative: 10 %
NEUTROS ABS: 15.7 10*3/uL — AB (ref 1.7–7.7)
NEUTROS PCT: 86 %
Platelets: 211 10*3/uL (ref 150–400)
RBC: 5.12 MIL/uL (ref 4.22–5.81)
RDW: 18.5 % — ABNORMAL HIGH (ref 11.5–15.5)
WBC: 18.4 10*3/uL — AB (ref 4.0–10.5)

## 2017-03-10 LAB — PROTIME-INR
INR: 2.22
PROTHROMBIN TIME: 25 s — AB (ref 11.4–15.2)

## 2017-03-10 LAB — CBC
HCT: 50.1 % (ref 39.0–52.0)
Hemoglobin: 16.5 g/dL (ref 13.0–17.0)
MCH: 28.8 pg (ref 26.0–34.0)
MCHC: 32.9 g/dL (ref 30.0–36.0)
MCV: 87.6 fL (ref 78.0–100.0)
PLATELETS: 226 10*3/uL (ref 150–400)
RBC: 5.72 MIL/uL (ref 4.22–5.81)
RDW: 18.5 % — AB (ref 11.5–15.5)
WBC: 16.4 10*3/uL — ABNORMAL HIGH (ref 4.0–10.5)

## 2017-03-10 LAB — URINALYSIS, ROUTINE W REFLEX MICROSCOPIC
BILIRUBIN URINE: NEGATIVE
Bacteria, UA: NONE SEEN
Glucose, UA: NEGATIVE mg/dL
HGB URINE DIPSTICK: NEGATIVE
KETONES UR: NEGATIVE mg/dL
LEUKOCYTES UA: NEGATIVE
NITRITE: NEGATIVE
PH: 5 (ref 5.0–8.0)
Protein, ur: 100 mg/dL — AB
SPECIFIC GRAVITY, URINE: 1.021 (ref 1.005–1.030)

## 2017-03-10 LAB — DIFFERENTIAL
BASOS PCT: 0 %
Basophils Absolute: 0 10*3/uL (ref 0.0–0.1)
EOS ABS: 0 10*3/uL (ref 0.0–0.7)
EOS PCT: 0 %
Lymphocytes Relative: 3 %
Lymphs Abs: 0.5 10*3/uL — ABNORMAL LOW (ref 0.7–4.0)
MONO ABS: 1.7 10*3/uL — AB (ref 0.1–1.0)
Monocytes Relative: 10 %
Neutro Abs: 14.2 10*3/uL — ABNORMAL HIGH (ref 1.7–7.7)
Neutrophils Relative %: 87 %

## 2017-03-10 LAB — I-STAT CHEM 8, ED
BUN: 29 mg/dL — ABNORMAL HIGH (ref 6–20)
CALCIUM ION: 1.02 mmol/L — AB (ref 1.15–1.40)
CHLORIDE: 92 mmol/L — AB (ref 101–111)
Creatinine, Ser: 1.1 mg/dL (ref 0.61–1.24)
Glucose, Bld: 162 mg/dL — ABNORMAL HIGH (ref 65–99)
HCT: 51 % (ref 39.0–52.0)
Hemoglobin: 17.3 g/dL — ABNORMAL HIGH (ref 13.0–17.0)
Potassium: 3.6 mmol/L (ref 3.5–5.1)
SODIUM: 135 mmol/L (ref 135–145)
TCO2: 31 mmol/L (ref 0–100)

## 2017-03-10 LAB — I-STAT TROPONIN, ED: Troponin i, poc: 0.03 ng/mL (ref 0.00–0.08)

## 2017-03-10 LAB — BLOOD GAS, ARTERIAL
ACID-BASE DEFICIT: 0.1 mmol/L (ref 0.0–2.0)
BICARBONATE: 25.6 mmol/L (ref 20.0–28.0)
DRAWN BY: 448981
FIO2: 50
O2 Saturation: 95.9 %
PH ART: 7.293 — AB (ref 7.350–7.450)
PO2 ART: 97.1 mmHg (ref 83.0–108.0)
Patient temperature: 98.5
pCO2 arterial: 54.5 mmHg — ABNORMAL HIGH (ref 32.0–48.0)

## 2017-03-10 LAB — LACTIC ACID, PLASMA
LACTIC ACID, VENOUS: 2 mmol/L — AB (ref 0.5–1.9)
Lactic Acid, Venous: 2 mmol/L (ref 0.5–1.9)

## 2017-03-10 LAB — I-STAT CG4 LACTIC ACID, ED: LACTIC ACID, VENOUS: 2.89 mmol/L — AB (ref 0.5–1.9)

## 2017-03-10 LAB — GLUCOSE, CAPILLARY
GLUCOSE-CAPILLARY: 128 mg/dL — AB (ref 65–99)
Glucose-Capillary: 155 mg/dL — ABNORMAL HIGH (ref 65–99)

## 2017-03-10 LAB — STREP PNEUMONIAE URINARY ANTIGEN: STREP PNEUMO URINARY ANTIGEN: NEGATIVE

## 2017-03-10 LAB — PROCALCITONIN: PROCALCITONIN: 2.08 ng/mL

## 2017-03-10 LAB — APTT: aPTT: 44 seconds — ABNORMAL HIGH (ref 24–36)

## 2017-03-10 LAB — PT WITH INR/FINGERSTICK
INR FINGERSTICK: 2.3 — AB (ref 0.9–1.1)
PT FINGERSTICK: 27.7 s — AB (ref 10.5–13.1)

## 2017-03-10 LAB — MRSA PCR SCREENING: MRSA by PCR: NEGATIVE

## 2017-03-10 LAB — CBG MONITORING, ED: Glucose-Capillary: 172 mg/dL — ABNORMAL HIGH (ref 65–99)

## 2017-03-10 MED ORDER — BISACODYL 10 MG RE SUPP: 10.0000 mg | Freq: Every day | RECTAL | Status: DC | PRN

## 2017-03-10 MED ORDER — GUAIFENESIN 100 MG/5ML PO SOLN
5.0000 mL | Freq: Once | ORAL | Status: AC
  Administered 2017-03-10: 100 mg via ORAL
  Filled 2017-03-10: qty 5

## 2017-03-10 MED ORDER — ACETAMINOPHEN 650 MG RE SUPP: 650.0000 mg | Freq: Four times a day (QID) | RECTAL | Status: DC | PRN

## 2017-03-10 MED ORDER — SODIUM CHLORIDE 0.9% FLUSH
3.0000 mL | Freq: Two times a day (BID) | INTRAVENOUS | Status: DC
  Administered 2017-03-10 – 2017-03-14 (×6): 3 mL via INTRAVENOUS

## 2017-03-10 MED ORDER — SODIUM CHLORIDE 0.9 % IV BOLUS (SEPSIS)
1000.0000 mL | Freq: Once | INTRAVENOUS | Status: AC
  Administered 2017-03-10: 1000 mL via INTRAVENOUS

## 2017-03-10 MED ORDER — ACETAMINOPHEN 500 MG PO TABS
1000.0000 mg | ORAL_TABLET | Freq: Once | ORAL | Status: AC
  Administered 2017-03-10: 1000 mg via ORAL
  Filled 2017-03-10: qty 2

## 2017-03-10 MED ORDER — WARFARIN SODIUM 5 MG PO TABS
10.0000 mg | ORAL_TABLET | ORAL | Status: DC
  Administered 2017-03-11: 10 mg via ORAL
  Filled 2017-03-10: qty 2

## 2017-03-10 MED ORDER — ENOXAPARIN SODIUM 40 MG/0.4ML ~~LOC~~ SOLN: 40.0000 mg | SUBCUTANEOUS | Status: DC

## 2017-03-10 MED ORDER — ORAL CARE MOUTH RINSE
15.0000 mL | Freq: Two times a day (BID) | OROMUCOSAL | Status: DC
  Administered 2017-03-14: 15 mL via OROMUCOSAL

## 2017-03-10 MED ORDER — POTASSIUM CHLORIDE CRYS ER 20 MEQ PO TBCR
40.0000 meq | EXTENDED_RELEASE_TABLET | Freq: Two times a day (BID) | ORAL | Status: DC
  Administered 2017-03-10 – 2017-03-15 (×10): 40 meq via ORAL
  Filled 2017-03-10 (×10): qty 2

## 2017-03-10 MED ORDER — ONDANSETRON HCL 4 MG PO TABS: 4.0000 mg | ORAL_TABLET | Freq: Four times a day (QID) | ORAL | Status: DC | PRN

## 2017-03-10 MED ORDER — DEXTROSE 5 % IV SOLN
500.0000 mg | Freq: Once | INTRAVENOUS | Status: AC
  Administered 2017-03-10: 500 mg via INTRAVENOUS
  Filled 2017-03-10: qty 500

## 2017-03-10 MED ORDER — FUROSEMIDE 80 MG PO TABS: 120.0000 mg | ORAL_TABLET | Freq: Two times a day (BID) | ORAL | Status: DC

## 2017-03-10 MED ORDER — METOPROLOL TARTRATE 50 MG PO TABS: 50.0000 mg | ORAL_TABLET | Freq: Two times a day (BID) | ORAL | Status: DC

## 2017-03-10 MED ORDER — ACETAMINOPHEN 325 MG PO TABS
650.0000 mg | ORAL_TABLET | Freq: Four times a day (QID) | ORAL | Status: DC | PRN
Start: 2017-03-10 — End: 2017-03-15

## 2017-03-10 MED ORDER — CEFTRIAXONE SODIUM 1 G IJ SOLR
1.0000 g | Freq: Once | INTRAMUSCULAR | Status: AC
  Administered 2017-03-10: 1 g via INTRAVENOUS
  Filled 2017-03-10: qty 10

## 2017-03-10 MED ORDER — DEXTROSE 5 % IV SOLN
1.0000 g | INTRAVENOUS | Status: DC
  Administered 2017-03-11 – 2017-03-12 (×2): 1 g via INTRAVENOUS
  Filled 2017-03-10 (×2): qty 10

## 2017-03-10 MED ORDER — ONDANSETRON HCL 4 MG/2ML IJ SOLN: 4.0000 mg | Freq: Four times a day (QID) | INTRAMUSCULAR | Status: DC | PRN

## 2017-03-10 MED ORDER — WARFARIN - PHYSICIAN DOSING INPATIENT: Freq: Every day | Status: DC

## 2017-03-10 MED ORDER — WARFARIN - PHARMACIST DOSING INPATIENT: Freq: Every day | Status: DC

## 2017-03-10 MED ORDER — WARFARIN SODIUM 5 MG PO TABS: 5.0000 mg | ORAL_TABLET | ORAL | Status: DC

## 2017-03-10 MED ORDER — INSULIN ASPART 100 UNIT/ML ~~LOC~~ SOLN
0.0000 [IU] | Freq: Three times a day (TID) | SUBCUTANEOUS | Status: DC
  Administered 2017-03-10: 2 [IU] via SUBCUTANEOUS
  Administered 2017-03-11 (×2): 1 [IU] via SUBCUTANEOUS
  Administered 2017-03-12 – 2017-03-13 (×3): 2 [IU] via SUBCUTANEOUS
  Administered 2017-03-13 – 2017-03-14 (×4): 1 [IU] via SUBCUTANEOUS
  Administered 2017-03-15: 2 [IU] via SUBCUTANEOUS
  Administered 2017-03-15: 1 [IU] via SUBCUTANEOUS

## 2017-03-10 MED ORDER — DEXTROSE 5 % IV SOLN
500.0000 mg | INTRAVENOUS | Status: DC
  Administered 2017-03-11: 500 mg via INTRAVENOUS
  Filled 2017-03-10 (×2): qty 500

## 2017-03-10 MED ORDER — IPRATROPIUM-ALBUTEROL 0.5-2.5 (3) MG/3ML IN SOLN
3.0000 mL | RESPIRATORY_TRACT | Status: DC | PRN
Start: 2017-03-10 — End: 2017-03-13

## 2017-03-10 MED ORDER — TRAZODONE HCL 50 MG PO TABS
25.0000 mg | ORAL_TABLET | Freq: Every evening | ORAL | Status: DC | PRN
  Administered 2017-03-11: 25 mg via ORAL
  Filled 2017-03-10: qty 1

## 2017-03-10 MED ORDER — FUROSEMIDE 80 MG PO TABS
80.0000 mg | ORAL_TABLET | Freq: Two times a day (BID) | ORAL | Status: DC
  Administered 2017-03-11 – 2017-03-15 (×8): 80 mg via ORAL
  Filled 2017-03-10 (×8): qty 1

## 2017-03-10 MED ORDER — SODIUM CHLORIDE 0.9 % IV SOLN
INTRAVENOUS | Status: DC
  Administered 2017-03-10 – 2017-03-11 (×2): via INTRAVENOUS

## 2017-03-10 MED ORDER — CHLORHEXIDINE GLUCONATE 0.12 % MT SOLN
15.0000 mL | Freq: Two times a day (BID) | OROMUCOSAL | Status: DC
  Administered 2017-03-10 – 2017-03-15 (×8): 15 mL via OROMUCOSAL
  Filled 2017-03-10 (×8): qty 15

## 2017-03-10 MED ORDER — HYDRALAZINE HCL 20 MG/ML IJ SOLN: 5.0000 mg | Freq: Three times a day (TID) | INTRAMUSCULAR | Status: DC | PRN

## 2017-03-10 MED ORDER — SENNOSIDES-DOCUSATE SODIUM 8.6-50 MG PO TABS
1.0000 | ORAL_TABLET | Freq: Every evening | ORAL | Status: DC | PRN
  Administered 2017-03-12 – 2017-03-14 (×2): 1 via ORAL
  Filled 2017-03-10 (×2): qty 1

## 2017-03-10 NOTE — ED Notes (Signed)
Attempted report x1. 

## 2017-03-10 NOTE — Progress Notes (Addendum)
Patient ID: William Alvarado MRN: [id number], DOB: 05-05-1951, 66 y.o. Date of Encounter: @DATE @  Chief Complaint:  Chief Complaint  Patient presents with  . coughing  . Shortness of Breath    HPI: 66 y.o. year old male  presents with above.  He was just added to my schedule within the past hour. Spit on schedule his altered mental status. When staff was raining patient staff informed me that they were getting his oxygen saturation reading 86% and pulse 164. When try to obtain EKG he could not lie flat secondary to shortness of breath. The following history was then obtained from wife: He reports that he [facility] for [facility]. States that he had been "fine up until that point ".  Says that he developed coughing spell. Says it actually was when they return to lie him back on their table is when he had coughing spell. Then when they got home at about 10 AM he was saying things that make no sense ". Also was doing things that made no since--- something about putting an object in the washing machine---  EMS had been called as soon as I was aware of oxygen saturation and pulse reading. By this time EMS arrives and further questioning: When asked year he says 2012. When asked where he is he starts reading off his name and his home address. He is asked how many times in a dollar takes a while men he says 2012 again. When asked if he is having pain anywhere he reports that he is having no pain anywhere. No headache. No chest pain.  Reviewed that he is on Coumadin. INR checked and is therapeutic. Blood sugar checked. Reviewed that he is diabetic.  Treated with oxygen via mask.   Past Medical History:  Diagnosis Date  . Allergy    allergic rhinitis  . Atrial fibrillation (HCC)   . Colon polyps   . COPD (chronic obstructive pulmonary disease) (HCC)   . Degenerative disc disease, lumbar   . Diabetes mellitus without complication  (HCC)   . Hypertension   . Systolic murmur      Home Meds: Outpatient Medications Prior to Visit  Medication Sig Dispense Refill  . fish oil-omega-3 fatty acids 1000 MG capsule Take 1 g by mouth daily.    . furosemide (LASIX) 40 MG tablet TAKE 3 TABLETS BY MOUTH TWICE A DAY 180 tablet 3  . metFORMIN (GLUCOPHAGE) 1000 MG tablet TAKE ONE TABLET BY MOUTH TWICE DAILY 60 tablet 5  . metolazone (ZAROXOLYN) 5 MG tablet Take 1 tablet (5 mg total) by mouth daily. Take 1 pill 30 minutes before lasix in am (Patient taking differently: Take 5 mg by mouth once a week. Take 1 pill 30 minutes before lasix in am) 30 tablet 0  . metoprolol (LOPRESSOR) 50 MG tablet TAKE ONE TABLET BY MOUTH TWICE DAILY 60 tablet 11  . potassium chloride SA (KLOR-CON M20) 20 MEQ tablet 2 tabs po bid 120 tablet 11  . warfarin (COUMADIN) 5 MG tablet TAKE AS DIRECTED 90 tablet 5  . albuterol (PROVENTIL HFA;VENTOLIN HFA) 108 (90 Base) MCG/ACT inhaler Inhale 2 puffs into the lungs every 6 (six) hours as needed for wheezing or shortness of breath. 1 Inhaler 0  . gabapentin (NEURONTIN) 300 MG capsule Take 1 capsule (300 mg total) by mouth at bedtime. 30 capsule 3  . losartan (COZAAR) 100 MG tablet TAKE ONE TABLET BY MOUTH ONCE DAILY  90 tablet 3   No facility-administered medications prior to visit.     Allergies: No Known Allergies  Social History   Social History  . Marital status: Married    Spouse name: N/A  . Number of children: 2  . Years of education: N/A   Occupational History  .      Machinist   Social History Main Topics  . Smoking status: Former Smoker    Packs/day: 1.50    Types: Cigarettes    Quit date: 08/18/2016  . Smokeless tobacco: Never Used  . Alcohol use No  . Drug use: No  . Sexual activity: Not Currently   Other Topics Concern  . Not on file   Social History Narrative  . No narrative on file    Family History  Problem Relation Age of Onset  . Cancer Mother 20       breast  . Heart  disease Father 61       MI  . Cancer Brother        lung  . Stroke Brother 45     Review of Systems:  See HPI for pertinent ROS. All other ROS negative.    Physical Exam: Pulse (!) 164, temperature 98.9 F (37.2 C), temperature source Oral, resp. rate 18, weight 288 lb 12.8 oz (131 kg), SpO2 (!) 86 %., Body mass index is 38.1 kg/m. General: Obese WM. he is sweating and has sweats apparent on his forehead. We tried to lie him back to get EKG his face becomes red and he becomes short of breath. Neck: Supple. No thyromegaly. No lymphadenopathy. Lungs: Distant breath sounds. Heart: Irregular rhythm. Tachycardic. Abdomen: Protuberant. Musculoskeletal:  Strength and tone normal for age. Extremities/Skin: His feet are purplish color and dusky but this is chronic. Neuro:  He is oriented to person and states his name. He is not oriented to place or time. Cranial nerves grossly intact. No facial droop appreciated. No change in gait or strength of extremities appreciated but this is all difficult to assess at this time. Psych:  Responds to questions appropriately with a normal affect.   Results for orders placed or performed in visit on 03/10/17  PT with INR/Fingerstick  Result Value Ref Range   PT, fingerstick 27.7 (H) 10.5 - 13.1 seconds   INR, fingerstick 2.3 (H) 0.9 - 1.1     ASSESSMENT AND PLAN:  66 y.o. year old male with  1. Chronic atrial fibrillation (HCC) - PT with INR/Fingerstick  2. Altered mental status, unspecified altered mental status type  3. Anticoagulated  4. Diabetes mellitus without complication (HCC)  5. Morbid obesity (HCC)  6. SOB (shortness of breath)  EMS was called immediately after patient's arrival here. IV started. Oxygen administered via mask. INR therapeutic at 2.3. Blood sugar was also checked but I did not document that reading but think it was around 166  He left the office in stable condition with EMS going to hospital for possible code  stroke status.  60 minutes was spent in direct patient contact.  [address] [place], [place], [facility] 03/10/2017 1:04 PM

## 2017-03-10 NOTE — ED Notes (Signed)
EDP at bedside. Respiratory paged to place patient on Venti mask.

## 2017-03-10 NOTE — ED Notes (Signed)
Dr. Kirkpatrick at bedside 

## 2017-03-10 NOTE — H&P (Signed)
History and Physical    William Alvarado EAV:[id number] DOB: 12/06/1950 DOA: 03/10/2017   PCP: Donita Brooks, MD   Patient coming from:  Home    Chief Complaint: Confusion and shortness of breath   HPI: William Alvarado is a 67 y.o. male with medical history significant for chronic atrial fibrillation, COPD, DJD, diabetes, hypertension, history of systolic murmur, brought by EMS to the emergency department, after he became confused at the physicians offices, around 10 in the morning. The patient was also coughing, and having acute onset of shortness of breath. He was noted to be hypoxic, in giving 3 L via mask by EMS, and on arrival, his saturations were in the low 80s. Initially, neurology was called, suspecting code stroke, but these was ruled out, in view of hypoxia, which may have contributed to his encephalopathy. He denies any prior episodes of acute shortness of breath  Or using O2 at home. Denies productive  sputum. Denies rhinorrhea or hemoptysis. Reported fevers, chills, night sweats Denies any chest pain, chest wall pain or palpitations.Reports his grandson having a "cold"  Denies any abdominal pain. Has decreased appetite due to current symptoms.Denies nauseaor vomiting. Denies dizziness or vertigo. Reports chronic lower extremity swelling. The patient did not take his diuretic today and has been retaining some fluid.  Denies any vision changes, double vision or headaches. His confusion is now resolved and is able converse full sentences   ED Course:  BP 100/69 (BP Location: Right Arm)   Pulse (!) 123   Temp 98.5 F (36.9 C) (Oral)   Resp (!) 27   Wt 132.5 kg (292 lb 1.8 oz)   SpO2 94%   BMI 38.54 kg/m    lactic acid 2.89 white count 18.4 hemoglobin 14.7 platelets 211 and culture spending  chest x-ray showed cardiomegaly with mild pulmonary vascular congestion, and the right base infiltrate suspicious for pneumonia. Last 2-D echo showed systolic function normal, EF 60 to 65     Review of Systems:  As per HPI otherwise all other systems reviewed and are negative  Past Medical History:  Diagnosis Date  . Allergy    allergic rhinitis  . Atrial fibrillation (HCC)   . Colon polyps   . COPD (chronic obstructive pulmonary disease) (HCC)   . Degenerative disc disease, lumbar   . Hypertension   . Pneumonia ~ 2016  . Systolic murmur   . Type II diabetes mellitus (HCC)     Past Surgical History:  Procedure Laterality Date  . CARDIOVERSION N/A 01/09/2013   Procedure: CARDIOVERSION;  Surgeon: Pricilla Riffle, MD;  Location: [facility] ENDOSCOPY;  Service: Cardiovascular;  Laterality: N/A;  . TEE WITHOUT CARDIOVERSION N/A 01/09/2013   Procedure: TRANSESOPHAGEAL ECHOCARDIOGRAM (TEE);  Surgeon: Pricilla Riffle, MD;  Location: [facility] ENDOSCOPY;  Service: Cardiovascular;  Laterality: N/A;    Social History Social History   Social History  . Marital status: Married    Spouse name: N/A  . Number of children: 2  . Years of education: N/A   Occupational History  .      Machinist   Social History Main Topics  . Smoking status: Current Every Day Smoker    Packs/day: 1.00    Years: 43.00    Types: Cigarettes    Last attempt to quit: 08/18/2016  . Smokeless tobacco: Never Used  . Alcohol use No  . Drug use: No  . Sexual activity: Not Currently   Other Topics Concern  . Not on file  Social History Narrative  . No narrative on file     No Known Allergies  Family History  Problem Relation Age of Onset  . Cancer Mother 25       breast  . Heart disease Father 70       MI  . Cancer Brother        lung  . Stroke Brother 45      Prior to Admission medications   Medication Sig Start Date End Date Taking? Authorizing Provider  fish oil-omega-3 fatty acids 1000 MG capsule Take 1 g by mouth daily.    [provider]  furosemide (LASIX) 40 MG tablet TAKE 3 TABLETS BY MOUTH TWICE A DAY 03/03/17   Donita Brooks, MD  metFORMIN (GLUCOPHAGE) 1000 MG tablet TAKE  ONE TABLET BY MOUTH TWICE DAILY 10/05/16   Donita Brooks, MD  metolazone (ZAROXOLYN) 5 MG tablet Take 1 tablet (5 mg total) by mouth daily. Take 1 pill 30 minutes before lasix in am Patient taking differently: Take 5 mg by mouth once a week. Take 1 pill 30 minutes before lasix in am 01/22/17   Donita Brooks, MD  metoprolol (LOPRESSOR) 50 MG tablet TAKE ONE TABLET BY MOUTH TWICE DAILY 05/18/16   Donita Brooks, MD  potassium chloride SA (KLOR-CON M20) 20 MEQ tablet 2 tabs po bid 09/08/16   Donita Brooks, MD  warfarin (COUMADIN) 5 MG tablet TAKE AS DIRECTED 09/22/16   Donita Brooks, MD    Physical Exam:  Vitals:   03/10/17 1615 03/10/17 1630 03/10/17 1641 03/10/17 1729  BP: (!) 83/57 97/64  100/69  Pulse: (!) 147 (!) 123    Resp: (!) 22 (!) 27    Temp:   98.2 F (36.8 C) 98.5 F (36.9 C)  TempSrc:   Oral Oral  SpO2: 94% 96%  94%  Weight:       Constitutional: ill appearing , alert, able to answer questions appropriately  Eyes: PERRL, lids and conjunctivae normal ENMT: Mucous membranes are moist, without exudate or lesions  Neck: normal, supple, no masses, no thyromegaly Respiratory: decreased breath sounds at the right base,, no wheezing , rhonchi audible. No accessory muscle use Cardiovascular: tachy irregularly irrecular rate and rhythm, soft 1/6 systolic murmur  2 + Bilateral lower extremity edema.  2+ pedal pulses. No carotid bruits.  Abdomen: Soft, obese  non tender, No hepatosplenomegaly. Bowel sounds positive.  Musculoskeletal: no clubbing / cyanosis. Moves all extremities Skin: no jaundice, non healing ulcers in lower extremities, chronic.  Neurologic: Sensation intact  Strength equal in all extremities Psychiatric:   Alert and oriented x 3. Anxious      Labs on Admission: I have personally reviewed following labs and imaging studies  CBC:  Recent Labs Lab 03/10/17 1322 03/10/17 1330 03/10/17 1554  WBC 16.4*  --  18.4*  NEUTROABS 14.2*  --  15.7*  HGB  16.5 17.3* 14.7  HCT 50.1 51.0 45.5  MCV 87.6  --  88.9  PLT 226  --  211    Basic Metabolic Panel:  Recent Labs Lab 03/10/17 1322 03/10/17 1330  NA 131* 135  K 3.6 3.6  CL 93* 92*  CO2 26  --   GLUCOSE 165* 162*  BUN 22* 29*  CREATININE 1.31* 1.10  CALCIUM 9.0  --     GFR: Estimated Creatinine Clearance: 98.1 mL/min (by C-G formula based on SCr of 1.1 mg/dL).  Liver Function Tests:  Recent Labs Lab 03/10/17 1322  AST 27  ALT 15*  ALKPHOS 67  BILITOT 1.5*  PROT 8.1  ALBUMIN 3.8   No results for input(s): LIPASE, AMYLASE in the last 168 hours. No results for input(s): AMMONIA in the last 168 hours.  Coagulation Profile:  Recent Labs Lab 03/10/17 1258 03/10/17 1322  INR 2.3* 2.22    Cardiac Enzymes: No results for input(s): CKTOTAL, CKMB, CKMBINDEX, TROPONINI in the last 168 hours.  BNP (last 3 results) No results for input(s): PROBNP in the last 8760 hours.  HbA1C: No results for input(s): HGBA1C in the last 72 hours.  CBG:  Recent Labs Lab 03/10/17 1321 03/10/17 1743  GLUCAP 172* 155*    Lipid Profile: No results for input(s): CHOL, HDL, LDLCALC, TRIG, CHOLHDL, LDLDIRECT in the last 72 hours.  Thyroid Function Tests: No results for input(s): TSH, T4TOTAL, FREET4, T3FREE, THYROIDAB in the last 72 hours.  Anemia Panel: No results for input(s): VITAMINB12, FOLATE, FERRITIN, TIBC, IRON, RETICCTPCT in the last 72 hours.  Urine analysis:    Component Value Date/Time   COLORURINE YELLOW 01/11/2013 1045   APPEARANCEUR CLEAR 01/11/2013 1045   LABSPEC 1.015 01/11/2013 1045   PHURINE 7.0 01/11/2013 1045   GLUCOSEU NEG 01/11/2013 1045   HGBUR TRACE (A) 01/11/2013 1045   BILIRUBINUR NEG 01/11/2013 1045   KETONESUR NEG 01/11/2013 1045   PROTEINUR NEG 01/11/2013 1045   UROBILINOGEN 2 (H) 01/11/2013 1045   NITRITE NEG 01/11/2013 1045   LEUKOCYTESUR NEG 01/11/2013 1045    Sepsis Labs: @LABRCNTIP (procalcitonin:4,lacticidven:4) )No results  found for this or any previous visit (from the past 240 hour(s)).   Radiological Exams on Admission: Dg Chest Port 1 View  Result Date: 03/10/2017 CLINICAL DATA:  Stroke.  History of atrial fibrillation and COPD. EXAM: PORTABLE CHEST 1 VIEW COMPARISON:  No recent prior. FINDINGS: Cardiomegaly with mild pulmonary vascular prominence. Right base infiltrate. Right base pneumonia cannot be excluded. No pleural effusion or pneumothorax. No acute bony abnormality. IMPRESSION: 1.  Cardiomegaly with mild pulmonary vascular congestion. 2. Right base infiltrate.  Right base pneumonia cannot be excluded . Electronically Signed   By: Maisie Fus  Register   On: 03/10/2017 14:00   Ct Head Code Stroke W/o Cm  Result Date: 03/10/2017 CLINICAL DATA:  Code stroke.  Confusion.  Expressive aphasia EXAM: CT HEAD WITHOUT CONTRAST TECHNIQUE: Contiguous axial images were obtained from the base of the skull through the vertex without intravenous contrast. COMPARISON:  None. FINDINGS: Brain: No evidence of acute infarction, hemorrhage, hydrocephalus, extra-axial collection or mass lesion/mass effect. Vascular: No hyperdense vessel or unexpected calcification. Skull: Negative Sinuses/Orbits: Negative Other: None ASPECTS ([place]berta Stroke Program Early CT Score) - Ganglionic level infarction (caudate, lentiform nuclei, internal capsule, insula, M1-M3 cortex): 7 - Supraganglionic infarction (M4-M6 cortex): 3 Total score (0-10 with 10 being normal): 10 IMPRESSION: 1. Negative CT head 2. ASPECTS is 10 These results were called by telephone at the time of interpretation on 03/10/2017 at 1:36 pm to Dr. Amada Jupiter, who verbally acknowledged these results. Electronically Signed   By: Marlan Palau M.D.   On: 03/10/2017 13:36    EKG: Independently reviewed.  Assessment/Plan Active Problems:   Hypertension   Systolic murmur   Diabetes mellitus without complication (HCC)   Mitral regurgitation   Sepsis (HCC)    Sepsis likely due  respiratory source organism unknown Patient meets criteria given tachycardia, tachypnea, fever, hypotension, leukocytosis, and evidence of organ dysfunction.  Antibiotics delivered in the ED with Rocephin and Zithromax IV. UA pending, . T max 103.6  EKG Afib (chronic) with RVR    Received  2  l NS to date, he continues to be on Ventimask  Admit to SDU Sepsis   IV antibiotics by pharmacy with Rocephin and Zithromax  Follow lactic acid  q 3 h  Follow blood and urine cultures IV fluids at 100 cc/h.  Procalcitonin order set     Acute respiratory failure  with hypoxia with fever in a patient with COPD history  Likely due to pneumonia..  WBC elevated at 18 . CXR with mild vasc congestion and right base infiltrate   Placed on  mask due to low sats in the 80s with improvement of his oxygen levels .    Pneumonia order set  Blood cultures 2 sputum cultures.  Strep pneumonia antigen   Resp vir panel  IV antibiotics DUoneb q 4 prn  Robitussin-DM when necessary cough.   Serial lactic acid CXR in am  RT to help patient improve his respiratory status     Type II Diabetes Current blood sugar level is 162 Lab Results  Component Value Date   HGBA1C 6.3 (H) 01/22/2017   Hold home oral diabetic medications.   SSI    Hypertension, currently low in the setting of sepsis  BP 92/65   Pulse  127  Hold home anti-hypertensive medications due to low BP  Hydralazine prn if BP 160/90     Atrial Fibrillation  With MR on anticoagulation with Coumadin with therapeutic INR Last 2-D echo showed systolic function normal, EF 60 to 65   Continue meds    DVT prophylaxis: Coumadin  Code Status:   Ful  Family Communication:  Discussed with patient Disposition Plan: Expect patient to be discharged to home after condition improves Consults called:  None   Admission status:  SDU    Vianney Kopecky E, PA-C [facility]   03/10/2017, 5:57 PM

## 2017-03-10 NOTE — ED Notes (Signed)
Patient placed on 15L NRB and mentation quickly improved. Pt now A&O x 4.

## 2017-03-10 NOTE — Progress Notes (Signed)
CRITICAL VALUE ALERT  Critical Value:  Lactic Acid (2.0)  Date & Time Notied:  03/10/2017 1711  Provider Notified: Dr Konrad Dolores (Md called at 1800 and was told result)  Orders Received/Actions taken: No order received

## 2017-03-10 NOTE — ED Provider Notes (Signed)
MSE was initiated and I personally evaluated the patient and placed orders (if any) at  1:23 PM on March 10, 2017.  The patient appears stable so that the remainder of the MSE may be completed by another provider.  Met patient and a bridge for airway clearing. Has confusion with some mild hypoxia. Reportedly had tilt table test was earlier today. Protecting his airway at this time. To be taken to CT scan with neurology. Will be seen primarily and another area.   Davonna Belling, MD 03/10/17 1324

## 2017-03-10 NOTE — Progress Notes (Signed)
Code stroke called at 1305, Patient arrived to [facility] at 1318.  AS per EMS, LSN 1000.  He became confused, had a coughing fit.  Family took him to PCP where he was noted to be diaphoretic, SOB and confused.  Sats in office were noted to be 86% on RA, was placed on oxygen mask and EMS summoned.  NIHSS 2, patient with respiratory distress, Sats 96% on face mask.  Patient unable to lie flat and is SOB.  Cancelled at [employer]

## 2017-03-10 NOTE — Progress Notes (Signed)
03/10/2017 Patient came from the Emergency room to 34M. He is alert, oriented and ambulatory. Patient have discoloration bilateral lower legs , bruising on arms, foot dry and heels flaky. He arrive on a venti mask on the unit from the Emergency room, then eventually was placed on droplet. Receive a CHG bath and MRSA swab. [facility] RN.

## 2017-03-10 NOTE — Progress Notes (Signed)
PHARMACIST - PHYSICIAN COMMUNICATION DR:  Konrad Dolores CONCERNING: Pharmacy Care Issues Regarding Warfarin Labs  RECOMMENDATION (Action Taken): A baseline and daily protime for three days has been ordered to meet the [facility] Patient safety goal and comply with the current [facility] Therapeutics Committee policy.   The Pharmacy will defer all warfarin dose order changes and follow up of lab results to the prescriber unless an additional order to initiate a "pharmacy Coumadin consult" is placed.  DESCRIPTION:  While hospitalized, to be in compliance with The Joint Commission National Patient Safety Goals, all patients on warfarin must have a baseline and/or current protime prior to the administration of warfarin. Pharmacy has received your order for warfarin without these required laboratory assessments.    Georgina Pillion, PharmD, BCPS Clinical Pharmacist 03/10/2017 5:55 PM

## 2017-03-10 NOTE — Progress Notes (Signed)
CRITICAL VALUE ALERT  Critical Value:  Lactic acid 2.0, crackles in lungs  Date & Time Notied:  03/10/17 8:38 PM   Provider Notified: Craige Cotta, NP  Orders Received/Actions taken:

## 2017-03-10 NOTE — ED Triage Notes (Signed)
Per [facility]: Pt to ED from [facility] for confusion/expressive aphasia. LKW 1000 - Code Stroke called PTA d/t altered mentation. Pt's wife took him to his PCP office this morning and pt was placed on 3L simple mask - SpO2 increased from 86% RA to 88%. Upon EMS arrival, pt placed on 10L mask and SpO2 increased to 94%, but mentation did not improve. No focal symptoms noted, grip strength equal, full ROM all extremities. Patient A&O x 2 upon arrival. EMS VS: 141/79, HR 125 A-Fib on monitor. Pt follows commands appropriately. Pt reports cough starting yesterday, non-productive, but unknown if he's had fevers/chills.

## 2017-03-10 NOTE — Progress Notes (Signed)
ANTICOAGULATION CONSULT NOTE - Initial Consult  Pharmacy Consult for Coumadin Indication: atrial fibrillation  No Known Allergies  Patient Measurements: Weight: 292 lb 1.8 oz (132.5 kg)  Vital Signs: Temp: 98.5 F (36.9 C) (06/20 1729) Temp Source: Oral (06/20 1729) BP: 100/69 (06/20 1729) Pulse Rate: 123 (06/20 1630)  Labs:  Recent Labs  03/10/17 1258  03/10/17 1322 03/10/17 1330 03/10/17 1554  HGB  --   < > 16.5 17.3* 14.7  HCT  --   --  50.1 51.0 45.5  PLT  --   --  226  --  211  APTT  --   --  44*  --   --   LABPROT  --   --  25.0*  --   --   INR 2.3*  --  2.22  --   --   CREATININE  --   --  1.31* 1.10  --   < > = values in this interval not displayed.  Estimated Creatinine Clearance: 98.1 mL/min (by C-G formula based on SCr of 1.1 mg/dL).   Medical History: Past Medical History:  Diagnosis Date  . Allergy    allergic rhinitis  . Atrial fibrillation (HCC)   . Colon polyps   . COPD (chronic obstructive pulmonary disease) (HCC)   . Degenerative disc disease, lumbar   . Hypertension   . Pneumonia ~ 2016  . Systolic murmur   . Type II diabetes mellitus (HCC)     Medications:  Prescriptions Prior to Admission  Medication Sig Dispense Refill Last Dose  . fish oil-omega-3 fatty acids 1000 MG capsule Take 1 g by mouth daily.   03/10/2017 at Unknown time  . furosemide (LASIX) 40 MG tablet TAKE 3 TABLETS BY MOUTH TWICE A DAY (Patient taking differently: TAKE 2 TABLETS BY MOUTH IN THE MORNING AND 1 TABLET BY MOUTH IN THE EVENING) 180 tablet 3 03/09/2017 at Unknown time  . metFORMIN (GLUCOPHAGE) 1000 MG tablet TAKE ONE TABLET BY MOUTH TWICE DAILY 60 tablet 5 03/10/2017 at Unknown time  . metolazone (ZAROXOLYN) 5 MG tablet Take 1 tablet (5 mg total) by mouth daily. Take 1 pill 30 minutes before lasix in am (Patient taking differently: Take 5 mg by mouth once a week. Take 1 pill 30 minutes before morning medications on Mondays) 30 tablet 0 Past Week at Unknown time  .  metoprolol (LOPRESSOR) 50 MG tablet TAKE ONE TABLET BY MOUTH TWICE DAILY 60 tablet 11 03/10/2017 at 730  . potassium chloride SA (KLOR-CON M20) 20 MEQ tablet 2 tabs po bid (Patient taking differently: Take 40 mEq by mouth 2 (two) times daily. 2 tabs po bid) 120 tablet 11 03/10/2017 at Unknown time  . warfarin (COUMADIN) 5 MG tablet Take 5-10 mg by mouth See admin instructions. Pt takes 5 mg twice a day on Tuesday, Thursday, Saturday and Sunday; Pt takes 5 mg once a day on Monday, Wednesday and Friday   03/10/2017 at 0730  . naproxen sodium (ANAPROX) 220 MG tablet Take 220-440 mg by mouth See admin instructions. Take 220 mg 440 mg as needed for pain   PRN  . warfarin (COUMADIN) 5 MG tablet TAKE AS DIRECTED (Patient not taking: Reported on 03/10/2017) 90 tablet 5 Not Taking at 730    Assessment: 66 yo M brought by EMS from PCP office for possible code stroke.  Pt had AMS, HR 160s, O2 sats 80s on RA.  Code stroke ruled out.  Started on abx for sepsis.  Pt on Coumadin  PTA for afib.  INR therapeutic on home regimen.  Pharmacy asked to continue.  Goal of Therapy:  INR 2-3 Monitor platelets by anticoagulation protocol: Yes   Plan:  Continue home dose (64mg  daily x 5mg  MWF) Daily INR for now  [employer], Pharm.D., BCPS Clinical Pharmacist Pager: [phone] 03/10/2017 6:16 PM

## 2017-03-10 NOTE — Consult Note (Signed)
Neurology Consultation Reason for Consult: Altered mental status Referring Physician: Code stroke(Wentz, E attending)  CC: Altered mental status  History is obtained from: Patient, EMS  HPI: William Alvarado is a 66 y.o. male was in his normal state of health when he finished some studies at vascular Center this morning around 10 AM. Shortly after that, he began coughing and having shortness of breath. Along with this, he became confused. He went to his physician's office where he was found to be short of breath, slightly confused, and hypoxic. He was started on 3 L via mask. On EMS arrival while the patient was on 3 L of oxygen he was satting in the low 80s, but due to altered mental status he could stroke was activated.  Here he was also found to be febrile. He was awake, alert, interactive, able to name objects, just some mild confusion with tangential statements.   LKW: 10 AM tpa given?: no, not a stroke, mild symptoms   ROS: A 14 point ROS was performed and is negative except as noted in the HPI.   Past Medical History:  Diagnosis Date  . Allergy    allergic rhinitis  . Atrial fibrillation (HCC)   . Colon polyps   . COPD (chronic obstructive pulmonary disease) (HCC)   . Degenerative disc disease, lumbar   . Diabetes mellitus without complication (HCC)   . Hypertension   . Systolic murmur      Family History  Problem Relation Age of Onset  . Cancer Mother 110       breast  . Heart disease Father 58       MI  . Cancer Brother        lung  . Stroke Brother 63     Social History:  reports that he quit smoking about 6 months ago. His smoking use included Cigarettes. He smoked 1.50 packs per day. He has never used smokeless tobacco. He reports that he does not drink alcohol or use drugs.   Exam: Current vital signs: BP 105/67   Pulse (!) 121   Temp (!) 103.6 F (39.8 C)   Resp (!) 23   Wt 132.5 kg (292 lb 1.8 oz)   SpO2 99%   BMI 38.54 kg/m  Vital signs in  last 24 hours: Temp:  [98.9 F (37.2 C)-103.6 F (39.8 C)] 103.6 F (39.8 C) (06/20 1415) Pulse Rate:  [120-164] 121 (06/20 1430) Resp:  [18-31] 23 (06/20 1430) BP: (105-114)/(67-75) 105/67 (06/20 1430) SpO2:  [86 %-100 %] 99 % (06/20 1430) Weight:  [131 kg (288 lb 12.8 oz)-132.5 kg (292 lb 1.8 oz)] 132.5 kg (292 lb 1.8 oz) (06/20 1326)   Physical Exam  Constitutional: Appears well-developed and well-nourished.  Psych: Affect appropriate to situation Eyes: No scleral injection HENT: No OP obstrucion Head: Normocephalic.  Cardiovascular: Normal rate and regular rhythm.  Respiratory: Effort normal and breath sounds normal to anterior ascultation GI: Soft.  No distension. There is no tenderness.  Skin: WDI  Neuro: Mental Status: Patient is awake, alert, oriented to person, place, month, year, and situation. Patient is able to give a clear and coherent history. No signs of aphasia or neglect Cranial Nerves: II: Visual Fields are full. Pupils are equal, round, and reactive to light.   III,IV, VI: EOMI without ptosis or diploplia.  V: Facial sensation is symmetric to temperature VII: Facial movement is symmetric.  VIII: hearing is intact to voice X: Uvula elevates symmetrically XI: Shoulder shrug is symmetric.  XII: tongue is midline without atrophy or fasciculations.  Motor: Tone is normal. Bulk is normal. 5/5 strength was present in all four extremities.  Sensory: Sensation is symmetric to light touch and temperature in the arms and legs. Deep Tendon Reflexes: 2+ and symmetric in the biceps and patellae.  Plantars: Toes are downgoing bilaterally.  Cerebellar: No clear ataxia  I have reviewed labs in epic and the results pertinent to this consultation are: Satting the low 90s on 10 L   I have reviewed the images obtained: CMP-mild hyponatremia Mildly elevated creatinine  Impression: 66 year old male with mild altered mental status in the setting of febrile illness  and hypoxia. At this time I do not have any reason to suspect a primary neurological cause of his altered mental status. If following addressing his medical issues, he remains with unexplained encephalopathy then please call with further questions or concerns.  Recommendations: 1) please call with further questions or concerns, neurology will sign off.   Ritta Slot, MD [facility] [phone]  If 7pm- 7am, please page neurology on call as listed in AMION.

## 2017-03-10 NOTE — ED Provider Notes (Signed)
[facility]-EMERGENCY DEPT Provider Note   CSN: [id number] Arrival date & time: 03/10/17  1318   An emergency department physician performed an initial assessment on this suspected stroke patient at 1320.  History   Chief Complaint Chief Complaint  Patient presents with  . Code Stroke    HPI William Alvarado is a 66 y.o. male.  He presents for evaluation of confusion, which started after he had an assessment by a vascular surgeon this morning for peripheral vascular disease.  Despite a report from EMS he did not have tilt table testing earlier today.  His wife gives history because the patient is confused.  The patient has had a mild cough recently.  He was well this morning, and able to eat breakfast.  After the vascular appointment, he was home went to the bathroom, and suddenly became confused.  Because of this his wife called EMS who transferred him here.  Level 5 caveat-confusion  HPI  Past Medical History:  Diagnosis Date  . Allergy    allergic rhinitis  . Atrial fibrillation (HCC)   . Colon polyps   . COPD (chronic obstructive pulmonary disease) (HCC)   . Degenerative disc disease, lumbar   . Diabetes mellitus without complication (HCC)   . Hypertension   . Systolic murmur     Patient Active Problem List   Diagnosis Date Noted  . Atrial fibrillation (HCC) 12/26/2012  . Mitral regurgitation 12/26/2012  . Morbid obesity (HCC) 12/09/2012  . Hypertension   . Allergy   . Systolic murmur   . Colon polyps   . Diabetes mellitus without complication (HCC)   . Degenerative disc disease, lumbar     Past Surgical History:  Procedure Laterality Date  . CARDIOVERSION N/A 01/09/2013   Procedure: CARDIOVERSION;  Surgeon: Pricilla Riffle, MD;  Location: [facility] ENDOSCOPY;  Service: Cardiovascular;  Laterality: N/A;  . TEE WITHOUT CARDIOVERSION N/A 01/09/2013   Procedure: TRANSESOPHAGEAL ECHOCARDIOGRAM (TEE);  Surgeon: Pricilla Riffle, MD;  Location: [facility] ENDOSCOPY;  Service: Cardiovascular;   Laterality: N/A;       Home Medications    Prior to Admission medications   Medication Sig Start Date End Date Taking? Authorizing Provider  fish oil-omega-3 fatty acids 1000 MG capsule Take 1 g by mouth daily.    [provider]  furosemide (LASIX) 40 MG tablet TAKE 3 TABLETS BY MOUTH TWICE A DAY 03/03/17   Donita Brooks, MD  metFORMIN (GLUCOPHAGE) 1000 MG tablet TAKE ONE TABLET BY MOUTH TWICE DAILY 10/05/16   Donita Brooks, MD  metolazone (ZAROXOLYN) 5 MG tablet Take 1 tablet (5 mg total) by mouth daily. Take 1 pill 30 minutes before lasix in am Patient taking differently: Take 5 mg by mouth once a week. Take 1 pill 30 minutes before lasix in am 01/22/17   Donita Brooks, MD  metoprolol (LOPRESSOR) 50 MG tablet TAKE ONE TABLET BY MOUTH TWICE DAILY 05/18/16   Donita Brooks, MD  potassium chloride SA (KLOR-CON M20) 20 MEQ tablet 2 tabs po bid 09/08/16   Donita Brooks, MD  warfarin (COUMADIN) 5 MG tablet TAKE AS DIRECTED 09/22/16   Donita Brooks, MD    Family History Family History  Problem Relation Age of Onset  . Cancer Mother 41       breast  . Heart disease Father 44       MI  . Cancer Brother        lung  . Stroke Brother 69  Social History Social History  Substance Use Topics  . Smoking status: Former Smoker    Packs/day: 1.50    Types: Cigarettes    Quit date: 08/18/2016  . Smokeless tobacco: Never Used  . Alcohol use No     Allergies   Patient has no known allergies.   Review of Systems Review of Systems  Unable to perform ROS: Mental status change     Physical Exam Updated Vital Signs BP 105/67   Pulse (!) 121   Temp (!) 103.6 F (39.8 C)   Resp (!) 23   Wt 132.5 kg (292 lb 1.8 oz)   SpO2 99%   BMI 38.54 kg/m   Physical Exam  Constitutional: He is oriented to person, place, and time. He appears well-developed. He appears distressed (Uncomfortable).  Obese  HENT:  Head: Normocephalic and atraumatic.  Right Ear:  External ear normal.  Left Ear: External ear normal.  Eyes: Conjunctivae and EOM are normal. Pupils are equal, round, and reactive to light.  Neck: Normal range of motion and phonation normal. Neck supple.  Cardiovascular: Normal heart sounds.   Irregular tachycardia  Pulmonary/Chest: Effort normal. He exhibits no bony tenderness.  Shallow respirations, mild tachypnea  Abdominal: Soft. There is no tenderness.  Musculoskeletal: Normal range of motion.  Neurological: He is alert and oriented to person, place, and time. No cranial nerve deficit or sensory deficit. He exhibits normal muscle tone. Coordination normal.  No dysarthria or aphasia  Skin: Skin is warm and intact.  Diaphoretic  Psychiatric: He has a normal mood and affect. His behavior is normal.  Nursing note and vitals reviewed.    ED Treatments / Results  Labs (all labs ordered are listed, but only abnormal results are displayed) Labs Reviewed  PROTIME-INR - Abnormal; Notable for the following:       Result Value   Prothrombin Time 25.0 (*)    All other components within normal limits  APTT - Abnormal; Notable for the following:    aPTT 44 (*)    All other components within normal limits  CBC - Abnormal; Notable for the following:    WBC 16.4 (*)    RDW 18.5 (*)    All other components within normal limits  DIFFERENTIAL - Abnormal; Notable for the following:    Neutro Abs 14.2 (*)    Lymphs Abs 0.5 (*)    Monocytes Absolute 1.7 (*)    All other components within normal limits  COMPREHENSIVE METABOLIC PANEL - Abnormal; Notable for the following:    Sodium 131 (*)    Chloride 93 (*)    Glucose, Bld 165 (*)    BUN 22 (*)    Creatinine, Ser 1.31 (*)    ALT 15 (*)    Total Bilirubin 1.5 (*)    GFR calc non Af Amer 56 (*)    All other components within normal limits  CBG MONITORING, ED - Abnormal; Notable for the following:    Glucose-Capillary 172 (*)    All other components within normal limits  I-STAT CHEM 8,  ED - Abnormal; Notable for the following:    Chloride 92 (*)    BUN 29 (*)    Glucose, Bld 162 (*)    Calcium, Ion 1.02 (*)    Hemoglobin 17.3 (*)    All other components within normal limits  I-STAT CG4 LACTIC ACID, ED - Abnormal; Notable for the following:    Lactic Acid, Venous 2.89 (*)    All other  components within normal limits  CULTURE, BLOOD (ROUTINE X 2)  CULTURE, BLOOD (ROUTINE X 2)  URINE CULTURE  URINALYSIS, ROUTINE W REFLEX MICROSCOPIC  I-STAT TROPOININ, ED  CBG MONITORING, ED    EKG  EKG Interpretation  Date/Time:  Wednesday March 10 2017 13:36:24 EDT Ventricular Rate:  147 PR Interval:    QRS Duration: 95 QT Interval:  287 QTC Calculation: 454 R Axis:   123 Text Interpretation:  Atrial fibrillation Paired ventricular premature complexes Anterior infarct, old ST depression, probably rate related Baseline wander in lead(s) V4 V6 Since last tracing Atrial fibrillation with rapid ventricular response is new Confirmed by Mancel Bale [phone]) on 03/10/2017 2:05:07 PM       CHA2DS2/VAS Stroke Risk Points      3 >= 2 Points: High Risk  1 - 1.99 Points: Medium Risk  0 Points: Low Risk    The previous score was 2 on 04/15/2016.:  Change: no        Details    Note: External data might be a factor in metrics not marked with    Points Metrics   This score determines the patient's risk of having a stroke if the  patient has atrial fibrillation.       1 Has Congestive Heart Failure:  Yes   0 Has Vascular Disease:  No   1 Has Hypertension:  Yes   0 Age:  20   1 Has Diabetes:  Yes   0 Had Stroke:  No Had TIA:  No Had thromboembolism:  No   0 Male:  No          Radiology Dg Chest Port 1 View  Result Date: 03/10/2017 CLINICAL DATA:  Stroke.  History of atrial fibrillation and COPD. EXAM: PORTABLE CHEST 1 VIEW COMPARISON:  No recent prior. FINDINGS: Cardiomegaly with mild pulmonary vascular prominence. Right base infiltrate. Right base pneumonia cannot be  excluded. No pleural effusion or pneumothorax. No acute bony abnormality. IMPRESSION: 1.  Cardiomegaly with mild pulmonary vascular congestion. 2. Right base infiltrate.  Right base pneumonia cannot be excluded . Electronically Signed   By: Maisie Fus  Register   On: 03/10/2017 14:00   Ct Head Code Stroke W/o Cm  Result Date: 03/10/2017 CLINICAL DATA:  Code stroke.  Confusion.  Expressive aphasia EXAM: CT HEAD WITHOUT CONTRAST TECHNIQUE: Contiguous axial images were obtained from the base of the skull through the vertex without intravenous contrast. COMPARISON:  None. FINDINGS: Brain: No evidence of acute infarction, hemorrhage, hydrocephalus, extra-axial collection or mass lesion/mass effect. Vascular: No hyperdense vessel or unexpected calcification. Skull: Negative Sinuses/Orbits: Negative Other: None ASPECTS (Alberta Stroke Program Early CT Score) - Ganglionic level infarction (caudate, lentiform nuclei, internal capsule, insula, M1-M3 cortex): 7 - Supraganglionic infarction (M4-M6 cortex): 3 Total score (0-10 with 10 being normal): 10 IMPRESSION: 1. Negative CT head 2. ASPECTS is 10 These results were called by telephone at the time of interpretation on 03/10/2017 at 1:36 pm to Dr. Amada Jupiter, who verbally acknowledged these results. Electronically Signed   By: Marlan Palau M.D.   On: 03/10/2017 13:36    Procedures Procedures (including critical care time)  Medications Ordered in ED Medications  sodium chloride 0.9 % bolus 1,000 mL (0 mLs Intravenous Stopped 03/10/17 1437)    And  sodium chloride 0.9 % bolus 1,000 mL (1,000 mLs Intravenous New Bag/Given 03/10/17 1400)    And  sodium chloride 0.9 % bolus 1,000 mL (1,000 mLs Intravenous New Bag/Given 03/10/17 1438)    And  sodium chloride 0.9 % bolus 1,000 mL (not administered)  azithromycin (ZITHROMAX) 500 mg in dextrose 5 % 250 mL IVPB (500 mg Intravenous New Bag/Given 03/10/17 1438)  guaiFENesin (ROBITUSSIN) 100 MG/5ML solution 100 mg (not  administered)  cefTRIAXone (ROCEPHIN) 1 g in dextrose 5 % 50 mL IVPB (0 g Intravenous Stopped 03/10/17 1437)  acetaminophen (TYLENOL) tablet 1,000 mg (1,000 mg Oral Given 03/10/17 1420)     Initial Impression / Assessment and Plan / ED Course  I have reviewed the triage vital signs and the nursing notes.  Pertinent labs & imaging results that were available during my care of the patient were reviewed by me and considered in my medical decision making (see chart for details).  Clinical Course as of Mar 11 1503  Wed Mar 10, 2017  1408 Early findings are consistent with confusion secondary to fever with pneumonia as source.  Doubt CVA, severe sepsis or metabolic instability.  Patient has recurrent atrial fibrillation will be treated to improve stability, prior to disposition.  [EW]    Clinical Course User Index [EW] Mancel Bale, MD     Patient Vitals for the past 24 hrs:  BP Temp Temp src Pulse Resp SpO2 Weight  03/10/17 1430 105/67 - - (!) 121 (!) 23 99 % -  03/10/17 1415 114/75 (!) 103.6 F (39.8 C) - (!) 120 (!) 28 100 % -  03/10/17 1400 - - - (!) 135 (!) 28 95 % -  03/10/17 1353 - (!) 103.6 F (39.8 C) Rectal - - - -  03/10/17 1345 - - - (!) 139 (!) 31 95 % -  03/10/17 1339 - - - (!) 140 19 - -  03/10/17 1326 - - - - - - 132.5 kg (292 lb 1.8 oz)  03/10/17 1318 - - - - - 94 % -    3:04 PM Reevaluation with update and discussion. After initial assessment and treatment, an updated evaluation reveals he is somewhat sleepy at this time but alert and responsive.  Patient family updated on findings and plan.  They are in agreement.Mancel Bale L    3:09 PM-Consult complete with hospitalist. Patient case explained and discussed.  She agrees to admit patient for further evaluation and treatment. Call ended at 15: 24  15: 12-I discussed with respiratory therapy regarding initiation of Ventimask, to wean oxygen to a safe level.   CRITICAL CARE Performed by: Flint Melter Total  critical care time: 45 minutes Critical care time was exclusive of separately billable procedures and treating other patients. Critical care was necessary to treat or prevent imminent or life-threatening deterioration. Critical care was time spent personally by me on the following activities: development of treatment plan with patient and/or surrogate as well as nursing, discussions with consultants, evaluation of patient's response to treatment, examination of patient, obtaining history from patient or surrogate, ordering and performing treatments and interventions, ordering and review of laboratory studies, ordering and review of radiographic studies, pulse oximetry and re-evaluation of patient's condition.   Final Clinical Impressions(s) / ED Diagnoses   Final diagnoses:  Community acquired pneumonia of right lower lobe of lung (HCC)  Atrial fibrillation with rapid ventricular response (HCC)  Altered mental status, unspecified altered mental status type  AKI (acute kidney injury) (HCC)  Acute respiratory failure with hypoxia (HCC)    Acute confusion, with fever, and findings for pneumonia and sepsis.  Initial lactate mildly elevated, patient treated with full sepsis bolus protocol.  Clinical status indicates improvement, with persistent hypoxia.  Doubt CVA, ACS, metabolic instability.  Patient initially had oxygen requirement, and in the ED, his oxygen is being weaned to Ventimask.  Nursing Notes Reviewed/ Care Coordinated Applicable Imaging Reviewed Interpretation of Laboratory Data incorporated into ED treatment  Plan: Admit  New Prescriptions New Prescriptions   No medications on file     Mancel Bale, MD 03/10/17 1610

## 2017-03-11 ENCOUNTER — Inpatient Hospital Stay (HOSPITAL_COMMUNITY): Payer: BLUE CROSS/BLUE SHIELD

## 2017-03-11 DIAGNOSIS — I4891 Unspecified atrial fibrillation: Secondary | ICD-10-CM

## 2017-03-11 DIAGNOSIS — N179 Acute kidney failure, unspecified: Secondary | ICD-10-CM

## 2017-03-11 DIAGNOSIS — J9601 Acute respiratory failure with hypoxia: Secondary | ICD-10-CM

## 2017-03-11 DIAGNOSIS — R4182 Altered mental status, unspecified: Secondary | ICD-10-CM

## 2017-03-11 LAB — COMPREHENSIVE METABOLIC PANEL
ALBUMIN: 3.1 g/dL — AB (ref 3.5–5.0)
ALT: 11 U/L — AB (ref 17–63)
AST: 13 U/L — AB (ref 15–41)
Alkaline Phosphatase: 58 U/L (ref 38–126)
Anion gap: 11 (ref 5–15)
BILIRUBIN TOTAL: 1.1 mg/dL (ref 0.3–1.2)
BUN: 23 mg/dL — AB (ref 6–20)
CO2: 23 mmol/L (ref 22–32)
CREATININE: 1.08 mg/dL (ref 0.61–1.24)
Calcium: 7.5 mg/dL — ABNORMAL LOW (ref 8.9–10.3)
Chloride: 101 mmol/L (ref 101–111)
GFR calc Af Amer: >60 mL/min (ref >60)
GLUCOSE: 111 mg/dL — AB (ref 65–99)
POTASSIUM: 3.9 mmol/L (ref 3.5–5.1)
Sodium: 135 mmol/L (ref 135–145)
TOTAL PROTEIN: 7.1 g/dL (ref 6.5–8.1)

## 2017-03-11 LAB — BLOOD GAS, ARTERIAL
ACID-BASE EXCESS: 2.9 mmol/L — AB (ref 0.0–2.0)
Bicarbonate: 28.1 mmol/L — ABNORMAL HIGH (ref 20.0–28.0)
Drawn by: 406621
O2 CONTENT: 8 L/min
O2 SAT: 97.8 %
PCO2 ART: 53.1 mmHg — AB (ref 32.0–48.0)
PO2 ART: 111 mmHg — AB (ref 83.0–108.0)
Patient temperature: 98.6
pH, Arterial: 7.344 — ABNORMAL LOW (ref 7.350–7.450)

## 2017-03-11 LAB — RESPIRATORY PANEL BY PCR
Adenovirus: NOT DETECTED
BORDETELLA PERTUSSIS-RVPCR: NOT DETECTED
Chlamydophila pneumoniae: NOT DETECTED
Coronavirus 229E: NOT DETECTED
Coronavirus HKU1: NOT DETECTED
Coronavirus NL63: NOT DETECTED
Coronavirus OC43: NOT DETECTED
INFLUENZA B-RVPPCR: NOT DETECTED
Influenza A: NOT DETECTED
METAPNEUMOVIRUS-RVPPCR: NOT DETECTED
Mycoplasma pneumoniae: NOT DETECTED
PARAINFLUENZA VIRUS 2-RVPPCR: NOT DETECTED
PARAINFLUENZA VIRUS 3-RVPPCR: NOT DETECTED
Parainfluenza Virus 1: NOT DETECTED
Parainfluenza Virus 4: NOT DETECTED
RESPIRATORY SYNCYTIAL VIRUS-RVPPCR: NOT DETECTED
RHINOVIRUS / ENTEROVIRUS - RVPPCR: NOT DETECTED

## 2017-03-11 LAB — GLUCOSE, CAPILLARY
GLUCOSE-CAPILLARY: 125 mg/dL — AB (ref 65–99)
Glucose-Capillary: 115 mg/dL — ABNORMAL HIGH (ref 65–99)
Glucose-Capillary: 132 mg/dL — ABNORMAL HIGH (ref 65–99)
Glucose-Capillary: 175 mg/dL — ABNORMAL HIGH (ref 65–99)

## 2017-03-11 LAB — CBC
HEMATOCRIT: 44.2 % (ref 39.0–52.0)
Hemoglobin: 13.4 g/dL (ref 13.0–17.0)
MCH: 27.7 pg (ref 26.0–34.0)
MCHC: 30.3 g/dL (ref 30.0–36.0)
MCV: 91.3 fL (ref 78.0–100.0)
PLATELETS: 203 10*3/uL (ref 150–400)
RBC: 4.84 MIL/uL (ref 4.22–5.81)
RDW: 19.2 % — AB (ref 11.5–15.5)
WBC: 13.7 10*3/uL — AB (ref 4.0–10.5)

## 2017-03-11 LAB — URINE CULTURE: Culture: NO GROWTH

## 2017-03-11 LAB — PROTIME-INR
INR: 2.1
Prothrombin Time: 23.9 seconds — ABNORMAL HIGH (ref 11.4–15.2)

## 2017-03-11 LAB — MAGNESIUM: Magnesium: 1.5 mg/dL — ABNORMAL LOW (ref 1.7–2.4)

## 2017-03-11 LAB — HEMOGLOBIN A1C
HEMOGLOBIN A1C: 7.1 % — AB (ref 4.8–5.6)
Mean Plasma Glucose: 157 mg/dL

## 2017-03-11 LAB — HIV ANTIBODY (ROUTINE TESTING W REFLEX): HIV Screen 4th Generation wRfx: NONREACTIVE

## 2017-03-11 LAB — LACTIC ACID, PLASMA: LACTIC ACID, VENOUS: 1.2 mmol/L (ref 0.5–1.9)

## 2017-03-11 LAB — BRAIN NATRIURETIC PEPTIDE: B Natriuretic Peptide: 329.9 pg/mL — ABNORMAL HIGH (ref 0.0–100.0)

## 2017-03-11 MED ORDER — FUROSEMIDE 10 MG/ML IJ SOLN
40.0000 mg | Freq: Once | INTRAMUSCULAR | Status: AC
  Administered 2017-03-11: 40 mg via INTRAVENOUS
  Filled 2017-03-11: qty 4

## 2017-03-11 MED ORDER — INSULIN ASPART 100 UNIT/ML ~~LOC~~ SOLN: 0.0000 [IU] | Freq: Every day | SUBCUTANEOUS | Status: DC

## 2017-03-11 MED ORDER — INSULIN ASPART 100 UNIT/ML ~~LOC~~ SOLN: 0.0000 [IU] | Freq: Three times a day (TID) | SUBCUTANEOUS | Status: DC

## 2017-03-11 NOTE — Progress Notes (Signed)
Pt taken off bipap and placed on VM at 8L/40%. Pt states no increasd WOB, no SOB, vital signs within normal limits. RT will continue to monitor

## 2017-03-11 NOTE — Progress Notes (Signed)
Pt currently on 8 LPM [place]  and tolerating well at this time.  Pt in no noted distress, BIPAP not indicated at this time.  RT to monitor and assess as needed.

## 2017-03-11 NOTE — Progress Notes (Signed)
ANTICOAGULATION CONSULT NOTE - Follow Up Consult  Pharmacy Consult for warfarin Indication: atrial fibrillation  No Known Allergies  Patient Measurements: Height: 6\' 1"  (185.4 cm) Weight: 298 lb 9.6 oz (135.4 kg) IBW/kg (Calculated) : 79.9  Vital Signs: Temp: 98.1 F (36.7 C) (06/21 0700) Temp Source: Oral (06/21 0700) BP: 116/86 (06/21 0800) Pulse Rate: 46 (06/21 0700)  Labs:  Recent Labs  03/10/17 1258  03/10/17 1322 03/10/17 1330 03/10/17 1554 03/11/17 0346  HGB  --   < > 16.5 17.3* 14.7 13.4  HCT  --   < > 50.1 51.0 45.5 44.2  PLT  --   --  226  --  211 203  APTT  --   --  44*  --   --   --   LABPROT  --   --  25.0*  --   --  23.9*  INR 2.3*  --  2.22  --   --  2.10  CREATININE  --   --  1.31* 1.10  --  1.08  < > = values in this interval not displayed.  Estimated Creatinine Clearance: 101.1 mL/min (by C-G formula based on SCr of 1.08 mg/dL).   Medications:  Prescriptions Prior to Admission  Medication Sig Dispense Refill Last Dose  . fish oil-omega-3 fatty acids 1000 MG capsule Take 1 g by mouth daily.   03/10/2017 at Unknown time  . furosemide (LASIX) 40 MG tablet TAKE 3 TABLETS BY MOUTH TWICE A DAY (Patient taking differently: TAKE 2 TABLETS BY MOUTH IN THE MORNING AND 1 TABLET BY MOUTH IN THE EVENING) 180 tablet 3 03/09/2017 at Unknown time  . metFORMIN (GLUCOPHAGE) 1000 MG tablet TAKE ONE TABLET BY MOUTH TWICE DAILY 60 tablet 5 03/10/2017 at Unknown time  . metolazone (ZAROXOLYN) 5 MG tablet Take 1 tablet (5 mg total) by mouth daily. Take 1 pill 30 minutes before lasix in am (Patient taking differently: Take 5 mg by mouth once a week. Take 1 pill 30 minutes before morning medications on Mondays) 30 tablet 0 Past Week at Unknown time  . metoprolol (LOPRESSOR) 50 MG tablet TAKE ONE TABLET BY MOUTH TWICE DAILY 60 tablet 11 03/10/2017 at 730  . potassium chloride SA (KLOR-CON M20) 20 MEQ tablet 2 tabs po bid (Patient taking differently: Take 40 mEq by mouth 2 (two)  times daily. 2 tabs po bid) 120 tablet 11 03/10/2017 at Unknown time  . warfarin (COUMADIN) 5 MG tablet Take 5-10 mg by mouth See admin instructions. Pt takes 5 mg twice a day on Tuesday, Thursday, Saturday and Sunday; Pt takes 5 mg once a day on Monday, Wednesday and Friday   03/10/2017 at 0730  . naproxen sodium (ANAPROX) 220 MG tablet Take 220-440 mg by mouth See admin instructions. Take 220 mg 440 mg as needed for pain   PRN  . warfarin (COUMADIN) 5 MG tablet TAKE AS DIRECTED (Patient not taking: Reported on 03/10/2017) 90 tablet 5 Not Taking at 730    Assessment: 66 yo M admitted 03/10/2017 as possible Code Stroke from PCP office. Code stroke ruled out and started on antimicrobials for sepsis. Patient on warfarin prior to admission for Afib. Pharmacy consulted to dose. INR therapeutic on admission.  PTA warfarin dosing: 10 mg SunTThSat and 5 mg MWF INR 2.1 (therapeutic), Hgb 13.4, Plt wnl. No s/sx of bleeding noted.  Goal of Therapy:  INR 2-3 Monitor platelets by anticoagulation protocol: Yes   Plan:  - Continue home warfarin regimen (10 mg SunTuesThursSat  and 5 mg MWF) - Monitor daily INR, CBC and signs/symptoms of bleeding  Casilda Carls, PharmD, BCPS PGY-2 Infectious Diseases Pharmacy Resident Pager: [phone] 03/11/2017,10:58 AM

## 2017-03-11 NOTE — Progress Notes (Signed)
[facility]                                                                              Patient Demographics  William Alvarado, is a 66 y.o. male, DOB - 22-Feb-1951, YBO:[id number]  Admit date - 03/10/2017   Admitting Physician Waldemar Dickens, MD  Outpatient Primary MD for the patient is William Frizzle, MD  Outpatient specialists:   LOS - 1  days   Medical records reviewed and are as summarized below:    Chief Complaint  Patient presents with  . Code Stroke       Brief summary   Patient is a 66 year old male with chronic atrial fibrillation, COPD, DJD, diabetes, hypertension, ? Diastolic CHF, pedal edema on Lasix brought to ED after he became confused at the physician's office around 10 AM in the morning, having acute onset of shortness of breath, hypoxic with O2 sat in low 80s. Reported fevers, chills or night sweats, chronic lower activity swelling and had not taken his diuretic. WBC count 18.4, lactic acid 2.89. Chest x-ray showed cardiomegaly with mild pulmonary vascular congestion, right base infiltrate suspicious for pneumonia. Patient was admitted and eventually placed on BiPAP in the stepdown unit  Assessment & Plan    Principal Problem:   Acute respiratory failure with hypoxia ([place]), Underlying history of COPD, possibly has diastolic CHF, now has right lung pneumonia WBC count elevated at 18, chest x-ray with vascular condition and right base infiltrate. Initially placed on Venti mask, then BiPAP - Continue IV Zithromax, Rocephin, follow blood cultures and respiratory virus panel negative - Urine strep antigen negative - ABG repeated this morning, improved pH to 7.3, PCO2 53, patient alert and oriented, BiPAP removed - BNP 329 with bibasal crackles, given Lasix 40 mg IV 1, I's and O's with 5 L positive, restarted oral Lasix   Active Problems: Possible acute on chronic diastolic CHF - Prior 2-D echo in 09/2015 had shown EF of 60-65%, however chest  x-ray with vascular congestion, on high-dose Lasix at home with retaining fluid - will repeat 2-D echo, given 1 dose of IV Lasix 40 mg, restarted Oral Lasix 80 mg twice a day - Follow strict I's and O's and daily weights - hold metolazone, will not be able to tolerate it due to hypotension  Sepsis due to the pneumonia - Patient met sepsis criteria at the time of admission given tachycardia, tachypnea, fever, hypotension, leukocytosis. Tmax in ED 103.10F - Patient was started on IV Rocephin and Zithromax in ED, follow blood cultures  Atrial fibrillation with RVR - Currently BP soft, holding Lopressor, tachycardiac - Continue Coumadin per pharmacy - May have to add digoxin if rate is not well controlled  Hypertension - BP currently soft, hold antihypertensives, continue Lasix  Diabetes mellitus  - Continue sliding scale insulin, follow hemoglobin A1c  Code Status: Full CODE STATUS  DVT Prophylaxis: Warfarin  Family Communication: Discussed in detail with the patient, all imaging results, lab results explained to the patient and wife at the bedside  Disposition Plan:Continue to monitor in stepdown unit  Time Spent in minutes  35 minutes  Procedures:  Consultants:   None   Antimicrobials:   IV Zithromax 6/20  IV Rocephin 6/20   Medications  Scheduled Meds: . chlorhexidine  15 mL Mouth Rinse BID  . furosemide  80 mg Oral BID  . insulin aspart  0-9 Units Subcutaneous TID WC  . mouth rinse  15 mL Mouth Rinse q12n4p  . potassium chloride SA  40 mEq Oral BID  . sodium chloride flush  3 mL Intravenous Q12H  . warfarin  10 mg Oral Q T,Th,S,Su-1800  . [START ON 03/12/2017] warfarin  5 mg Oral Q M,W,F-1800  . Warfarin - Pharmacist Dosing Inpatient   Does not apply q1800   Continuous Infusions: . azithromycin    . cefTRIAXone (ROCEPHIN)  IV     PRN Meds:.acetaminophen **OR** acetaminophen, bisacodyl, hydrALAZINE, ipratropium-albuterol, ondansetron **OR** ondansetron  (ZOFRAN) IV, senna-docusate, traZODone   Antibiotics   Anti-infectives    Start     Dose/Rate Route Frequency Ordered Stop   03/11/17 1400  cefTRIAXone (ROCEPHIN) 1 g in dextrose 5 % 50 mL IVPB     1 g 100 mL/hr over 30 Minutes Intravenous Every 24 hours 03/10/17 1553     03/11/17 1400  azithromycin (ZITHROMAX) 500 mg in dextrose 5 % 250 mL IVPB     500 mg 250 mL/hr over 60 Minutes Intravenous Every 24 hours 03/10/17 1553     03/10/17 1345  cefTRIAXone (ROCEPHIN) 1 g in dextrose 5 % 50 mL IVPB     1 g 100 mL/hr over 30 Minutes Intravenous  Once 03/10/17 1341 03/10/17 1437   03/10/17 1345  azithromycin (ZITHROMAX) 500 mg in dextrose 5 % 250 mL IVPB     500 mg 250 mL/hr over 60 Minutes Intravenous  Once 03/10/17 1341 03/10/17 1549        Subjective:   [name] was seen and examined today.  Alert and awake, oriented to day, shortness of breath is improving, no fevers and chills this morning. BiPAP removed. Patient denies dizziness, chest pain, , abdominal pain, N/V/D/C, new weakness, numbess, tingling.   Objective:   Vitals:   03/11/17 0512 03/11/17 0600 03/11/17 0700 03/11/17 0800  BP: 114/75 109/63 119/76 116/86  Pulse: 95 100 (!) 46   Resp: 19 19 (!) 21   Temp: 98.1 F (36.7 C)  98.1 F (36.7 C)   TempSrc: Axillary  Oral   SpO2: 96% 94% 96% 98%  Weight:      Height:        Intake/Output Summary (Last 24 hours) at 03/11/17 1147 Last data filed at 03/11/17 0941  Gross per 24 hour  Intake          6094.34 ml  Output             2225 ml  Net          3869.34 ml     Wt Readings from Last 3 Encounters:  03/11/17 135.4 kg (298 lb 9.6 oz)  03/10/17 131 kg (288 lb 12.8 oz)  01/25/17 (!) 137.4 kg (303 lb)     Exam  General: Alert and oriented x 3, NAD  Eyes: PERRLA, EOMI, Anicteric Sclera,  HEENT:  Atraumatic, normocephalic, normal oropharynx  Cardiovascular: S1 S2 Clear, Tachycardia, irregularly irregular, .1+ lower extremity edema  Respiratory:  Bibasilar crackles   Gastrointestinal: Soft, nontender, nondistended, + bowel sounds  Ext: no pedal edema bilaterally  Neuro: AAOx3, Cr N's II- XII. Strength 5/5 upper and lower extremities bilaterally, speech clear, sensations grossly intact  Musculoskeletal:  No digital cyanosis, clubbing  Skin: Chronic lower extremity venous stasis changes and nonhealing ulcer   Psych: Normal affect and demeanor, alert and oriented x3    Data Reviewed:  I have personally reviewed following labs and imaging studies  Micro Results Recent Results (from the past 240 hour(s))  MRSA PCR Screening     Status: None   Collection Time: 03/10/17  5:27 PM  Result Value Ref Range Status   MRSA by PCR NEGATIVE NEGATIVE Final    Comment:        The GeneXpert MRSA Assay (FDA approved for NASAL specimens only), is one component of a comprehensive MRSA colonization surveillance program. It is not intended to diagnose MRSA infection nor to guide or monitor treatment for MRSA infections.   Respiratory Panel by PCR     Status: None   Collection Time: 03/10/17  9:00 PM  Result Value Ref Range Status   Adenovirus NOT DETECTED NOT DETECTED Final   Coronavirus 229E NOT DETECTED NOT DETECTED Final   Coronavirus HKU1 NOT DETECTED NOT DETECTED Final   Coronavirus NL63 NOT DETECTED NOT DETECTED Final   Coronavirus OC43 NOT DETECTED NOT DETECTED Final   Metapneumovirus NOT DETECTED NOT DETECTED Final   Rhinovirus / Enterovirus NOT DETECTED NOT DETECTED Final   Influenza A NOT DETECTED NOT DETECTED Final   Influenza B NOT DETECTED NOT DETECTED Final   Parainfluenza Virus 1 NOT DETECTED NOT DETECTED Final   Parainfluenza Virus 2 NOT DETECTED NOT DETECTED Final   Parainfluenza Virus 3 NOT DETECTED NOT DETECTED Final   Parainfluenza Virus 4 NOT DETECTED NOT DETECTED Final   Respiratory Syncytial Virus NOT DETECTED NOT DETECTED Final   Bordetella pertussis NOT DETECTED NOT DETECTED Final   Chlamydophila pneumoniae  NOT DETECTED NOT DETECTED Final   Mycoplasma pneumoniae NOT DETECTED NOT DETECTED Final    Radiology Reports Dg Chest Port 1 View  Result Date: 03/11/2017 CLINICAL DATA:  Cough and fever EXAM: PORTABLE CHEST 1 VIEW COMPARISON:  March 10, 2017 FINDINGS: Patchy infiltrate remains in the right base. There is slight left base atelectasis. Lungs elsewhere clear. Cardiomegaly with pulmonary venous hypertension persists. No adenopathy. No bone lesions. IMPRESSION: Persistent pulmonary vascular congestion. Patchy right base infiltrate, suspicious for pneumonia. Mild left base atelectasis. Electronically Signed   By: Lowella Grip III M.D.   On: 03/11/2017 08:29   Dg Chest Port 1 View  Result Date: 03/10/2017 CLINICAL DATA:  Stroke.  History of atrial fibrillation and COPD. EXAM: PORTABLE CHEST 1 VIEW COMPARISON:  No recent prior. FINDINGS: Cardiomegaly with mild pulmonary vascular prominence. Right base infiltrate. Right base pneumonia cannot be excluded. No pleural effusion or pneumothorax. No acute bony abnormality. IMPRESSION: 1.  Cardiomegaly with mild pulmonary vascular congestion. 2. Right base infiltrate.  Right base pneumonia cannot be excluded . Electronically Signed   By: Marcello Moores  Register   On: 03/10/2017 14:00   Ct Head Code Stroke W/o Cm  Result Date: 03/10/2017 CLINICAL DATA:  Code stroke.  Confusion.  Expressive aphasia EXAM: CT HEAD WITHOUT CONTRAST TECHNIQUE: Contiguous axial images were obtained from the base of the skull through the vertex without intravenous contrast. COMPARISON:  None. FINDINGS: Brain: No evidence of acute infarction, hemorrhage, hydrocephalus, extra-axial collection or mass lesion/mass effect. Vascular: No hyperdense vessel or unexpected calcification. Skull: Negative Sinuses/Orbits: Negative Other: None ASPECTS ([place] Stroke Program Early CT Score) - Ganglionic level infarction (caudate, lentiform nuclei, internal capsule, insula, M1-M3 cortex): 7 - Supraganglionic  infarction (M4-M6 cortex): 3 Total score (  0-10 with 10 being normal): 10 IMPRESSION: 1. Negative CT head 2. ASPECTS is 10 These results were called by telephone at the time of interpretation on 03/10/2017 at 1:36 pm to Dr. Leonel Ramsay, who verbally acknowledged these results. Electronically Signed   By: Franchot Gallo M.D.   On: 03/10/2017 13:36    Lab Data:  CBC:  Recent Labs Lab 03/10/17 1322 03/10/17 1330 03/10/17 1554 03/11/17 0346  WBC 16.4*  --  18.4* 13.7*  NEUTROABS 14.2*  --  15.7*  --   HGB 16.5 17.3* 14.7 13.4  HCT 50.1 51.0 45.5 44.2  MCV 87.6  --  88.9 91.3  PLT 226  --  211 308   Basic Metabolic Panel:  Recent Labs Lab 03/10/17 1322 03/10/17 1330 03/11/17 0346  NA 131* 135 135  K 3.6 3.6 3.9  CL 93* 92* 101  CO2 26  --  23  GLUCOSE 165* 162* 111*  BUN 22* 29* 23*  CREATININE 1.31* 1.10 1.08  CALCIUM 9.0  --  7.5*   GFR: Estimated Creatinine Clearance: 101.1 mL/min (by C-G formula based on SCr of 1.08 mg/dL). Liver Function Tests:  Recent Labs Lab 03/10/17 1322 03/11/17 0346  AST 27 13*  ALT 15* 11*  ALKPHOS 67 58  BILITOT 1.5* 1.1  PROT 8.1 7.1  ALBUMIN 3.8 3.1*   No results for input(s): LIPASE, AMYLASE in the last 168 hours. No results for input(s): AMMONIA in the last 168 hours. Coagulation Profile:  Recent Labs Lab 03/10/17 1258 03/10/17 1322 03/11/17 0346  INR 2.3* 2.22 2.10   Cardiac Enzymes: No results for input(s): CKTOTAL, CKMB, CKMBINDEX, TROPONINI in the last 168 hours. BNP (last 3 results) No results for input(s): PROBNP in the last 8760 hours. HbA1C:  Recent Labs  03/10/17 1601  HGBA1C 7.1*   CBG:  Recent Labs Lab 03/10/17 1321 03/10/17 1743 03/10/17 2214 03/11/17 0725  GLUCAP 172* 155* 128* 115*   Lipid Profile: No results for input(s): CHOL, HDL, LDLCALC, TRIG, CHOLHDL, LDLDIRECT in the last 72 hours. Thyroid Function Tests: No results for input(s): TSH, T4TOTAL, FREET4, T3FREE, THYROIDAB in the last 72  hours. Anemia Panel: No results for input(s): VITAMINB12, FOLATE, FERRITIN, TIBC, IRON, RETICCTPCT in the last 72 hours. Urine analysis:    Component Value Date/Time   COLORURINE AMBER (A) 03/10/2017 1909   APPEARANCEUR HAZY (A) 03/10/2017 1909   LABSPEC 1.021 03/10/2017 1909   PHURINE 5.0 03/10/2017 1909   GLUCOSEU NEGATIVE 03/10/2017 1909   HGBUR NEGATIVE 03/10/2017 1909   BILIRUBINUR NEGATIVE 03/10/2017 1909   KETONESUR NEGATIVE 03/10/2017 1909   PROTEINUR 100 (A) 03/10/2017 1909   UROBILINOGEN 2 (H) 01/11/2013 1045   NITRITE NEGATIVE 03/10/2017 1909   LEUKOCYTESUR NEGATIVE 03/10/2017 1909     Ripudeep Rai M.D. [facility] 03/11/2017, 11:47 AM  Pager: [phone] Between 7am to 7pm - call Pager - [phone]  After 7pm go to [email or web address] - password [email or web address]  Call night coverage person covering after 7pm

## 2017-03-11 NOTE — Progress Notes (Signed)
Patient had 9 beats of ventricular tachycardia.  Rhythm is otherwise afib.  Also noted patient heart rate increase to 130's-140's with activity.  Patient is asymptomatic.  MD on call has been made aware; continuing to monitor patient.

## 2017-03-11 NOTE — Care Management Note (Signed)
Case Management Note  Patient Details  Name: William Alvarado MRN: [id number] Date of Birth: 18-Jan-1951  Subjective/Objective:    From home with wife, presents with acute resp failure with hypoxia, poss acute on chronic chf , sepsis due to pna, afib with rvr,htn and DM, on bipap, now on HFNC 8 liters. He has [facility].  PCP Lynnea Ferrier                  Action/Plan: NCM will folllow for dc needs.   Expected Discharge Date:                  Expected Discharge Plan:     In-House Referral:     Discharge planning Services  CM Consult  Post Acute Care Choice:    Choice offered to:     DME Arranged:    DME Agency:     HH Arranged:    HH Agency:     Status of Service:  In process, will continue to follow  If discussed at Long Length of Stay Meetings, dates discussed:    Additional Comments:  Leone Haven, RN 03/11/2017, 5:06 PM

## 2017-03-12 ENCOUNTER — Inpatient Hospital Stay (HOSPITAL_COMMUNITY): Payer: BLUE CROSS/BLUE SHIELD

## 2017-03-12 ENCOUNTER — Encounter (HOSPITAL_COMMUNITY): Payer: Self-pay | Admitting: Radiology

## 2017-03-12 DIAGNOSIS — E119 Type 2 diabetes mellitus without complications: Secondary | ICD-10-CM

## 2017-03-12 DIAGNOSIS — R0609 Other forms of dyspnea: Secondary | ICD-10-CM

## 2017-03-12 DIAGNOSIS — Z86711 Personal history of pulmonary embolism: Secondary | ICD-10-CM

## 2017-03-12 DIAGNOSIS — J181 Lobar pneumonia, unspecified organism: Secondary | ICD-10-CM

## 2017-03-12 LAB — BASIC METABOLIC PANEL
ANION GAP: 7 (ref 5–15)
BUN: 16 mg/dL (ref 6–20)
CALCIUM: 8.5 mg/dL — AB (ref 8.9–10.3)
CO2: 31 mmol/L (ref 22–32)
Chloride: 96 mmol/L — ABNORMAL LOW (ref 101–111)
Creatinine, Ser: 1.03 mg/dL (ref 0.61–1.24)
Glucose, Bld: 116 mg/dL — ABNORMAL HIGH (ref 65–99)
Potassium: 4 mmol/L (ref 3.5–5.1)
SODIUM: 134 mmol/L — AB (ref 135–145)

## 2017-03-12 LAB — CBC
HCT: 42.3 % (ref 39.0–52.0)
Hemoglobin: 13.4 g/dL (ref 13.0–17.0)
MCH: 28.8 pg (ref 26.0–34.0)
MCHC: 31.7 g/dL (ref 30.0–36.0)
MCV: 90.8 fL (ref 78.0–100.0)
Platelets: 203 10*3/uL (ref 150–400)
RBC: 4.66 MIL/uL (ref 4.22–5.81)
RDW: 18.8 % — AB (ref 11.5–15.5)
WBC: 11.6 10*3/uL — AB (ref 4.0–10.5)

## 2017-03-12 LAB — GLUCOSE, CAPILLARY
GLUCOSE-CAPILLARY: 147 mg/dL — AB (ref 65–99)
GLUCOSE-CAPILLARY: 176 mg/dL — AB (ref 65–99)
Glucose-Capillary: 161 mg/dL — ABNORMAL HIGH (ref 65–99)
Glucose-Capillary: 147 mg/dL — ABNORMAL HIGH (ref 65–99)

## 2017-03-12 LAB — PROTIME-INR
INR: 1.75
PROTHROMBIN TIME: 20.6 s — AB (ref 11.4–15.2)

## 2017-03-12 LAB — HEPARIN LEVEL (UNFRACTIONATED): HEPARIN UNFRACTIONATED: 0.28 [IU]/mL — AB (ref 0.30–0.70)

## 2017-03-12 LAB — D-DIMER, QUANTITATIVE (NOT AT ARMC): D DIMER QUANT: 1.57 ug{FEU}/mL — AB (ref 0.00–0.50)

## 2017-03-12 LAB — HEMOGLOBIN A1C
HEMOGLOBIN A1C: 7.2 % — AB (ref 4.8–5.6)
Mean Plasma Glucose: 160 mg/dL

## 2017-03-12 MED ORDER — HEPARIN BOLUS VIA INFUSION
5500.0000 [IU] | Freq: Once | INTRAVENOUS | Status: AC
  Administered 2017-03-12: 5500 [IU] via INTRAVENOUS
  Filled 2017-03-12: qty 5500

## 2017-03-12 MED ORDER — VANCOMYCIN HCL 10 G IV SOLR
2000.0000 mg | Freq: Once | INTRAVENOUS | Status: AC
  Administered 2017-03-12: 2000 mg via INTRAVENOUS
  Filled 2017-03-12: qty 2000

## 2017-03-12 MED ORDER — METOLAZONE 10 MG PO TABS
5.0000 mg | ORAL_TABLET | Freq: Once | ORAL | Status: AC
  Administered 2017-03-12: 5 mg via ORAL
  Filled 2017-03-12: qty 1

## 2017-03-12 MED ORDER — DEXTROSE 5 % IV SOLN
1.0000 g | Freq: Three times a day (TID) | INTRAVENOUS | Status: DC
  Administered 2017-03-12 – 2017-03-15 (×9): 1 g via INTRAVENOUS
  Filled 2017-03-12 (×11): qty 1

## 2017-03-12 MED ORDER — HEPARIN (PORCINE) IN NACL 100-0.45 UNIT/ML-% IJ SOLN
2300.0000 [IU]/h | INTRAMUSCULAR | Status: AC
  Administered 2017-03-12: 1800 [IU]/h via INTRAVENOUS
  Administered 2017-03-13: 2300 [IU]/h via INTRAVENOUS
  Administered 2017-03-13: 2100 [IU]/h via INTRAVENOUS
  Administered 2017-03-13 – 2017-03-15 (×4): 2300 [IU]/h via INTRAVENOUS
  Filled 2017-03-12 (×7): qty 250

## 2017-03-12 MED ORDER — IOPAMIDOL (ISOVUE-370) INJECTION 76%
INTRAVENOUS | Status: AC
  Administered 2017-03-12: 100 mL
  Filled 2017-03-12: qty 100

## 2017-03-12 MED ORDER — VANCOMYCIN HCL 10 G IV SOLR
1250.0000 mg | Freq: Two times a day (BID) | INTRAVENOUS | Status: DC
  Administered 2017-03-13 – 2017-03-15 (×5): 1250 mg via INTRAVENOUS
  Filled 2017-03-12 (×6): qty 1250

## 2017-03-12 MED ORDER — FUROSEMIDE 10 MG/ML IJ SOLN
40.0000 mg | Freq: Once | INTRAMUSCULAR | Status: AC
  Administered 2017-03-12: 40 mg via INTRAVENOUS
  Filled 2017-03-12: qty 4

## 2017-03-12 MED ORDER — WARFARIN SODIUM 5 MG PO TABS: 7.5000 mg | ORAL_TABLET | Freq: Once | ORAL | Status: DC

## 2017-03-12 NOTE — Progress Notes (Signed)
*  PRELIMINARY RESULTS* Vascular Ultrasound Lower extremity venous duplex has been completed.  Preliminary findings: No evidence of DVT or baker's cyst.  Farrel Demark, RDMS, RVT  03/12/2017, 4:29 PM

## 2017-03-12 NOTE — Progress Notes (Addendum)
ANTICOAGULATION and ANTIBIOTIC CONSULT NOTE - Follow Up Consult  Pharmacy Consult for vanc/zosyn and heparin gtt Indication: Pneumonia and Acute pulmonary embolus  No Known Allergies  Patient Measurements: Height: 6\' 1"  (185.4 cm) Weight: 292 lb 15.9 oz (132.9 kg) IBW/kg (Calculated) : 79.9 Heparin Dosing Weight: 110 kg  Vital Signs: Temp: (P) 99.2 F (37.3 C) (06/22 1141) Temp Source: (P) Oral (06/22 1141) BP: 118/79 (06/22 1000) Pulse Rate: 110 (06/22 1000)  Labs:  Recent Labs  03/10/17 1322 03/10/17 1330 03/10/17 1554 03/11/17 0346 03/12/17 0442  HGB 16.5 17.3* 14.7 13.4 13.4  HCT 50.1 51.0 45.5 44.2 42.3  PLT 226  --  211 203 203  APTT 44*  --   --   --   --   LABPROT 25.0*  --   --  23.9* 20.6*  INR 2.22  --   --  2.10 1.75  CREATININE 1.31* 1.10  --  1.08 1.03    Estimated Creatinine Clearance: 105 mL/min (by C-G formula based on SCr of 1.03 mg/dL).   Medications:  Prescriptions Prior to Admission  Medication Sig Dispense Refill Last Dose  . fish oil-omega-3 fatty acids 1000 MG capsule Take 1 g by mouth daily.   03/10/2017 at Unknown time  . furosemide (LASIX) 40 MG tablet TAKE 3 TABLETS BY MOUTH TWICE A DAY (Patient taking differently: TAKE 2 TABLETS BY MOUTH IN THE MORNING AND 1 TABLET BY MOUTH IN THE EVENING) 180 tablet 3 03/09/2017 at Unknown time  . metFORMIN (GLUCOPHAGE) 1000 MG tablet TAKE ONE TABLET BY MOUTH TWICE DAILY 60 tablet 5 03/10/2017 at Unknown time  . metolazone (ZAROXOLYN) 5 MG tablet Take 1 tablet (5 mg total) by mouth daily. Take 1 pill 30 minutes before lasix in am (Patient taking differently: Take 5 mg by mouth once a week. Take 1 pill 30 minutes before morning medications on Mondays) 30 tablet 0 Past Week at Unknown time  . metoprolol (LOPRESSOR) 50 MG tablet TAKE ONE TABLET BY MOUTH TWICE DAILY 60 tablet 11 03/10/2017 at 730  . potassium chloride SA (KLOR-CON M20) 20 MEQ tablet 2 tabs po bid (Patient taking differently: Take 40 mEq by mouth  2 (two) times daily. 2 tabs po bid) 120 tablet 11 03/10/2017 at Unknown time  . warfarin (COUMADIN) 5 MG tablet Take 5-10 mg by mouth See admin instructions. Pt takes 5 mg twice a day on Tuesday, Thursday, Saturday and Sunday; Pt takes 5 mg once a day on Monday, Wednesday and Friday   03/10/2017 at 0730  . naproxen sodium (ANAPROX) 220 MG tablet Take 220-440 mg by mouth See admin instructions. Take 220 mg 440 mg as needed for pain   PRN  . warfarin (COUMADIN) 5 MG tablet TAKE AS DIRECTED (Patient not taking: Reported on 03/10/2017) 90 tablet 5 Not Taking at 730    Assessment: 67 yo M admitted 03/10/2017 on warfarin prior to admission for Afib. Patient with new acute PE and concern for multifocal pneumonia. Pharmacy consulted to dose heparin and start vancomycin/cefepime. Patient was previously on ceftriaxone/azithromycin and warfarin, will discontinue these agents. INR today was subtherapeutic at 1.75, of note his INR was therapeutic on admission.  Goal of Therapy:  Heparin level 0.3-0.7 units/ml Monitor platelets by anticoagulation protocol: Yes   Plan:  - Heparin 5500 unit bolus x1 - Heparin 1800 units/hr - 6 hour heparin level - Monitor daily heparin level, CBC and signs/symptoms of bleeding - Follow up plans to change to oral anticoagulant (likely DOAC per  Dr. Isidoro Donning)  - Vancomycin 2000 mg IV x1 loading dose - Vancomycin 1250 mg IV q12h - Cefepime 1g IV q8h - Monitor C&S, renal function and length of therapy  Casilda Carls, PharmD, BCPS PGY-2 Infectious Diseases Pharmacy Resident Pager: [phone] 03/12/2017,1:33 PM

## 2017-03-12 NOTE — Progress Notes (Signed)
ANTICOAGULATION CONSULT NOTE   Pharmacy Consult for heparin Indication: pulmonary embolus  No Known Allergies  Patient Measurements: Height: 6\' 1"  (185.4 cm) Weight: 292 lb 15.9 oz (132.9 kg) IBW/kg (Calculated) : 79.9 Heparin Dosing Weight: 110 kg  Vital Signs: Temp: 98.4 F (36.9 C) (06/22 2018) Temp Source: Oral (06/22 2018) BP: 124/82 (06/22 2018) Pulse Rate: 117 (06/22 2018)  Labs:  Recent Labs  03/10/17 1322 03/10/17 1330 03/10/17 1554 03/11/17 0346 03/12/17 0442 03/12/17 2138  HGB 16.5 17.3* 14.7 13.4 13.4  --   HCT 50.1 51.0 45.5 44.2 42.3  --   PLT 226  --  211 203 203  --   APTT 44*  --   --   --   --   --   LABPROT 25.0*  --   --  23.9* 20.6*  --   INR 2.22  --   --  2.10 1.75  --   HEPARINUNFRC  --   --   --   --   --  0.28*  CREATININE 1.31* 1.10  --  1.08 1.03  --     Estimated Creatinine Clearance: 105 mL/min (by C-G formula based on SCr of 1.03 mg/dL).   Medical History: Past Medical History:  Diagnosis Date  . Allergy    allergic rhinitis  . Atrial fibrillation (HCC)   . Colon polyps   . COPD (chronic obstructive pulmonary disease) (HCC)   . Degenerative disc disease, lumbar   . Hypertension   . Pneumonia ~ 2016  . Systolic murmur   . Type II diabetes mellitus (HCC)     Medications:  Heparin infusion at 1800 units/hr  Assessment: 66 yo M admitted 03/10/2017 on warfarin prior to admission for Afib. Patient with new acute PE.  Initial heparin level is slightly subtherapeutic at 0.28.    Goal of Therapy:  Heparin level 0.3-0.7 units/ml Monitor platelets by anticoagulation protocol: Yes   Plan:  1. Increase heparin infusion to 2100 units/hr 2. Repeat heparin level with am labs on 6/23  Pollyann Samples, PharmD, BCPS 03/12/2017, 10:29 PM

## 2017-03-12 NOTE — Progress Notes (Signed)
ANTICOAGULATION CONSULT NOTE - Follow Up Consult  Pharmacy Consult for warfarin Indication: atrial fibrillation  No Known Allergies  Patient Measurements: Height: 6\' 1"  (185.4 cm) Weight: 292 lb 15.9 oz (132.9 kg) IBW/kg (Calculated) : 79.9  Vital Signs: Temp: 98.1 F (36.7 C) (06/22 0746) Temp Source: Oral (06/22 0746) BP: 123/76 (06/22 0751) Pulse Rate: 104 (06/22 0746)  Labs:  Recent Labs  03/10/17 1322 03/10/17 1330 03/10/17 1554 03/11/17 0346 03/12/17 0442  HGB 16.5 17.3* 14.7 13.4 13.4  HCT 50.1 51.0 45.5 44.2 42.3  PLT 226  --  211 203 203  APTT 44*  --   --   --   --   LABPROT 25.0*  --   --  23.9* 20.6*  INR 2.22  --   --  2.10 1.75  CREATININE 1.31* 1.10  --  1.08 1.03    Estimated Creatinine Clearance: 105 mL/min (by C-G formula based on SCr of 1.03 mg/dL).   Medications:  Prescriptions Prior to Admission  Medication Sig Dispense Refill Last Dose  . fish oil-omega-3 fatty acids 1000 MG capsule Take 1 g by mouth daily.   03/10/2017 at Unknown time  . furosemide (LASIX) 40 MG tablet TAKE 3 TABLETS BY MOUTH TWICE A DAY (Patient taking differently: TAKE 2 TABLETS BY MOUTH IN THE MORNING AND 1 TABLET BY MOUTH IN THE EVENING) 180 tablet 3 03/09/2017 at Unknown time  . metFORMIN (GLUCOPHAGE) 1000 MG tablet TAKE ONE TABLET BY MOUTH TWICE DAILY 60 tablet 5 03/10/2017 at Unknown time  . metolazone (ZAROXOLYN) 5 MG tablet Take 1 tablet (5 mg total) by mouth daily. Take 1 pill 30 minutes before lasix in am (Patient taking differently: Take 5 mg by mouth once a week. Take 1 pill 30 minutes before morning medications on Mondays) 30 tablet 0 Past Week at Unknown time  . metoprolol (LOPRESSOR) 50 MG tablet TAKE ONE TABLET BY MOUTH TWICE DAILY 60 tablet 11 03/10/2017 at 730  . potassium chloride SA (KLOR-CON M20) 20 MEQ tablet 2 tabs po bid (Patient taking differently: Take 40 mEq by mouth 2 (two) times daily. 2 tabs po bid) 120 tablet 11 03/10/2017 at Unknown time  . warfarin  (COUMADIN) 5 MG tablet Take 5-10 mg by mouth See admin instructions. Pt takes 5 mg twice a day on Tuesday, Thursday, Saturday and Sunday; Pt takes 5 mg once a day on Monday, Wednesday and Friday   03/10/2017 at 0730  . naproxen sodium (ANAPROX) 220 MG tablet Take 220-440 mg by mouth See admin instructions. Take 220 mg 440 mg as needed for pain   PRN  . warfarin (COUMADIN) 5 MG tablet TAKE AS DIRECTED (Patient not taking: Reported on 03/10/2017) 90 tablet 5 Not Taking at 730    Assessment: 66 yo M admitted 03/10/2017 as possible Code Stroke from PCP office. Code stroke ruled out and started on antimicrobials for sepsis. Patient on warfarin prior to admission for Afib. Pharmacy consulted to dose. INR therapeutic on admission.  PTA warfarin dosing: 10 mg SunTThSat and 5 mg MWF INR 1.75 (subtherapeutic on home regimen), Hgb 13.4, Plt wnl. No s/sx of bleeding noted.  Goal of Therapy:  INR 2-3 Monitor platelets by anticoagulation protocol: Yes   Plan:  - Will give warfarin 7.5 mg PO x1 tonight - Discontinued scheduled home regimen - Monitor daily INR, CBC and signs/symptoms of bleeding  Casilda Carls, PharmD, BCPS PGY-2 Infectious Diseases Pharmacy Resident Pager: [phone] 03/12/2017,9:38 AM

## 2017-03-12 NOTE — Progress Notes (Addendum)
[facility]                                                                              Patient Demographics  William Alvarado, is a 66 y.o. male, DOB - [birth year], ASN:[id number]  Admit date - 03/10/2017   Admitting Physician Waldemar Dickens, MD  Outpatient Primary MD for the patient is [name], MD  Outpatient specialists:   LOS - 2  days   Medical records reviewed and are as summarized below:    Chief Complaint  Patient presents with  . Code Stroke       Brief summary   Patient is a 66 year old male with chronic atrial fibrillation, COPD, DJD, diabetes, hypertension, ? Diastolic CHF, pedal edema on Lasix brought to ED after he became confused at the physician's office around 10 AM in the morning, having acute onset of shortness of breath, hypoxic with O2 sat in low 80s. Reported fevers, chills or night sweats, chronic lower activity swelling and had not taken his diuretic. WBC count 18.4, lactic acid 2.89. Chest x-ray showed cardiomegaly with mild pulmonary vascular congestion, right base infiltrate suspicious for pneumonia. Patient was admitted and eventually placed on BiPAP in the stepdown unit  Assessment & Plan    Principal Problem:   Acute respiratory failure with hypoxia ([place]), Underlying history of COPD, possibly has diastolic CHF, now has right lung pneumonia WBC count elevated at 18, chest x-ray with vascular condition and right base infiltrate. Initially placed on Venti mask, then BiPAP - BiPAP off, O2 8 L now titrated down to 6 L - Continue IV Zithromax, Rocephin, Blood cultures negative so far, respiratory virus panel negative - Urine strep antigen negative - Still hypoxic, O2 the nasal cannula high flow 6 L, not on any O2 at home outpatient - D-dimer elevated 1.57, obtain CT angiogram of the chest to rule out PE (less likely as patient is on Coumadin however will also give more information about lungs) Addendum: 1:30pm Called by  radiology, Dr Tery Sanfilippo. Patient has small nonocclusive pulmonary embolism in the segmental and subsegmental pulmonary arteries to the lingula (patient is on warfarin prior to admission hence a warfarin failure) Also noted patchy airspace disease bilaterally right greater than left suspicious for multifocal pneumonia with mild bilateral hilar lymphadenopathy right greater than left may be reactive, follow-up recommended - Will DC IV Zithromax and Rocephin and broadened coverage with IV vancomycin and cefepime -  Clearly the patient has warfarin failure. Discussed with pharmacy, will place on IV heparin and change to xarelto or eliquis after case management checks with insurance    Active Problems: Possible acute on chronic diastolic CHF - Prior 2-D echo in 09/2015 had shown EF of 60-65%, however chest x-ray with vascular congestion, on high-dose Lasix at home with retaining fluid - Continue Lasix 80 mg twice a day, still positive balance 2.8 L  - will give 1 more dose of IV Lasix 40 mg 1, add metolazone 17m x1 - 2-D echo pending  Sepsis due to the pneumonia - Patient met sepsis criteria at the time of admission given tachycardia, tachypnea, fever, hypotension, leukocytosis. Tmax in ED  103.75F - Patient was started on IV Rocephin and Zithromax in ED, blood cultures negative  Atrial fibrillation with RVR - BP soft, holding Lopressor  - Continue Coumadin per pharmacy - May have to add digoxin if rate is not well controlled  Hypertension - BP currently soft, hold antihypertensives, continue Lasix  Diabetes mellitus  - Continue sliding scale insulin, hemoglobin A1c 7.2  Code Status: Full CODE STATUS  DVT Prophylaxis: Warfarin  Family Communication: Discussed in detail with the patient, all imaging results, lab results explained to the patient  Disposition Plan:Continue to monitor in stepdown unit   Time Spent in minutes  35 minutes  Procedures:    Consultants:   None    Antimicrobials:   IV Zithromax 6/20  IV Rocephin 6/20   Medications  Scheduled Meds: . chlorhexidine  15 mL Mouth Rinse BID  . furosemide  80 mg Oral BID  . insulin aspart  0-5 Units Subcutaneous QHS  . insulin aspart  0-9 Units Subcutaneous TID WC  . mouth rinse  15 mL Mouth Rinse q12n4p  . potassium chloride SA  40 mEq Oral BID  . sodium chloride flush  3 mL Intravenous Q12H  . warfarin  7.5 mg Oral ONCE-1800  . Warfarin - Pharmacist Dosing Inpatient   Does not apply q1800   Continuous Infusions: . azithromycin Stopped (03/11/17 1529)  . cefTRIAXone (ROCEPHIN)  IV Stopped (03/11/17 1336)   PRN Meds:.acetaminophen **OR** acetaminophen, bisacodyl, hydrALAZINE, ipratropium-albuterol, ondansetron **OR** ondansetron (ZOFRAN) IV, senna-docusate, traZODone   Antibiotics   Anti-infectives    Start     Dose/Rate Route Frequency Ordered Stop   03/11/17 1400  cefTRIAXone (ROCEPHIN) 1 g in dextrose 5 % 50 mL IVPB     1 g 100 mL/hr over 30 Minutes Intravenous Every 24 hours 03/10/17 1553     03/11/17 1400  azithromycin (ZITHROMAX) 500 mg in dextrose 5 % 250 mL IVPB     500 mg 250 mL/hr over 60 Minutes Intravenous Every 24 hours 03/10/17 1553     03/10/17 1345  cefTRIAXone (ROCEPHIN) 1 g in dextrose 5 % 50 mL IVPB     1 g 100 mL/hr over 30 Minutes Intravenous  Once 03/10/17 1341 03/10/17 1437   03/10/17 1345  azithromycin (ZITHROMAX) 500 mg in dextrose 5 % 250 mL IVPB     500 mg 250 mL/hr over 60 Minutes Intravenous  Once 03/10/17 1341 03/10/17 1549        Subjective:   William Alvarado was seen and examined today. Still on high flow O2, 8 L via nasal cannula, titrated down to 6 L. Shortness of breath is improving. No chest pain. No fevers or chills. Did not need BiPAP overnight. Patient denies dizziness, chest pain, abdominal pain, N/V/D/C, new weakness, numbess, tingling.   Objective:   Vitals:   03/12/17 0425 03/12/17 0600 03/12/17 0746 03/12/17 0751  BP: 116/71   123/76 123/76  Pulse:   (!) 104   Resp: (!) 25  19   Temp:   98.1 F (36.7 C)   TempSrc:   Oral   SpO2: 96%  100%   Weight:  132.9 kg (292 lb 15.9 oz)    Height:        Intake/Output Summary (Last 24 hours) at 03/12/17 1056 Last data filed at 03/12/17 0600  Gross per 24 hour  Intake              740 ml  Output  1775 ml  Net            -1035 ml     Wt Readings from Last 3 Encounters:  03/12/17 132.9 kg (292 lb 15.9 oz)  03/10/17 131 kg (288 lb 12.8 oz)  01/25/17 (!) 137.4 kg (303 lb)     Exam  General: Alert and oriented x 3, NAD  Eyes: PERRLA, EOMI  HEENT: normocephalic, atraumatic  Cardiovascular: S1 and S2 clear, irregularly irregular  Respiratory: Bibasilar crackles with rhonchi  Gastrointestinal: Soft, NT, ND, NBS  Ext: trace pedal edema  Neuro: no new deficits  Musculoskeletal: No cyanosis, clubbing  Skin: Chronic lower extremity venous stasis changes and nonhealing ulcer   Psych: alert and oriented 3, normal affect   Data Reviewed:  I have personally reviewed following labs and imaging studies  Micro Results Recent Results (from the past 240 hour(s))  Blood Culture (routine x 2)     Status: None (Preliminary result)   Collection Time: 03/10/17  1:43 PM  Result Value Ref Range Status   Specimen Description BLOOD RIGHT ARM  Final   Special Requests   Final    BOTTLES DRAWN AEROBIC AND ANAEROBIC Blood Culture adequate volume   Culture NO GROWTH 1 DAY  Final   Report Status PENDING  Incomplete  Blood Culture (routine x 2)     Status: None (Preliminary result)   Collection Time: 03/10/17  1:47 PM  Result Value Ref Range Status   Specimen Description BLOOD LEFT HAND  Final   Special Requests   Final    BOTTLES DRAWN AEROBIC AND ANAEROBIC Blood Culture adequate volume   Culture NO GROWTH 1 DAY  Final   Report Status PENDING  Incomplete  MRSA PCR Screening     Status: None   Collection Time: 03/10/17  5:27 PM  Result Value Ref Range  Status   MRSA by PCR NEGATIVE NEGATIVE Final    Comment:        The GeneXpert MRSA Assay (FDA approved for NASAL specimens only), is one component of a comprehensive MRSA colonization surveillance program. It is not intended to diagnose MRSA infection nor to guide or monitor treatment for MRSA infections.   Urine culture     Status: None   Collection Time: 03/10/17  7:10 PM  Result Value Ref Range Status   Specimen Description URINE, RANDOM  Final   Special Requests NONE  Final   Culture NO GROWTH  Final   Report Status 03/11/2017 FINAL  Final  Respiratory Panel by PCR     Status: None   Collection Time: 03/10/17  9:00 PM  Result Value Ref Range Status   Adenovirus NOT DETECTED NOT DETECTED Final   Coronavirus 229E NOT DETECTED NOT DETECTED Final   Coronavirus HKU1 NOT DETECTED NOT DETECTED Final   Coronavirus NL63 NOT DETECTED NOT DETECTED Final   Coronavirus OC43 NOT DETECTED NOT DETECTED Final   Metapneumovirus NOT DETECTED NOT DETECTED Final   Rhinovirus / Enterovirus NOT DETECTED NOT DETECTED Final   Influenza A NOT DETECTED NOT DETECTED Final   Influenza B NOT DETECTED NOT DETECTED Final   Parainfluenza Virus 1 NOT DETECTED NOT DETECTED Final   Parainfluenza Virus 2 NOT DETECTED NOT DETECTED Final   Parainfluenza Virus 3 NOT DETECTED NOT DETECTED Final   Parainfluenza Virus 4 NOT DETECTED NOT DETECTED Final   Respiratory Syncytial Virus NOT DETECTED NOT DETECTED Final   Bordetella pertussis NOT DETECTED NOT DETECTED Final   Chlamydophila pneumoniae NOT DETECTED NOT  DETECTED Final   Mycoplasma pneumoniae NOT DETECTED NOT DETECTED Final    Radiology Reports Dg Chest Port 1 View  Result Date: 03/11/2017 CLINICAL DATA:  Cough and fever EXAM: PORTABLE CHEST 1 VIEW COMPARISON:  March 10, 2017 FINDINGS: Patchy infiltrate remains in the right base. There is slight left base atelectasis. Lungs elsewhere clear. Cardiomegaly with pulmonary venous hypertension persists. No  adenopathy. No bone lesions. IMPRESSION: Persistent pulmonary vascular congestion. Patchy right base infiltrate, suspicious for pneumonia. Mild left base atelectasis. Electronically Signed   By: Lowella Grip III M.D.   On: 03/11/2017 08:29   Dg Chest Port 1 View  Result Date: 03/10/2017 CLINICAL DATA:  Stroke.  History of atrial fibrillation and COPD. EXAM: PORTABLE CHEST 1 VIEW COMPARISON:  No recent prior. FINDINGS: Cardiomegaly with mild pulmonary vascular prominence. Right base infiltrate. Right base pneumonia cannot be excluded. No pleural effusion or pneumothorax. No acute bony abnormality. IMPRESSION: 1.  Cardiomegaly with mild pulmonary vascular congestion. 2. Right base infiltrate.  Right base pneumonia cannot be excluded . Electronically Signed   By: Marcello Moores  Register   On: 03/10/2017 14:00   Ct Head Code Stroke W/o Cm  Result Date: 03/10/2017 CLINICAL DATA:  Code stroke.  Confusion.  Expressive aphasia EXAM: CT HEAD WITHOUT CONTRAST TECHNIQUE: Contiguous axial images were obtained from the base of the skull through the vertex without intravenous contrast. COMPARISON:  None. FINDINGS: Brain: No evidence of acute infarction, hemorrhage, hydrocephalus, extra-axial collection or mass lesion/mass effect. Vascular: No hyperdense vessel or unexpected calcification. Skull: Negative Sinuses/Orbits: Negative Other: None ASPECTS ([place] Stroke Program Early CT Score) - Ganglionic level infarction (caudate, lentiform nuclei, internal capsule, insula, M1-M3 cortex): 7 - Supraganglionic infarction (M4-M6 cortex): 3 Total score (0-10 with 10 being normal): 10 IMPRESSION: 1. Negative CT head 2. ASPECTS is 10 These results were called by telephone at the time of interpretation on 03/10/2017 at 1:36 pm to Dr. Leonel Ramsay, who verbally acknowledged these results. Electronically Signed   By: Franchot Gallo M.D.   On: 03/10/2017 13:36    Lab Data:  CBC:  Recent Labs Lab 03/10/17 1322 03/10/17 1330  03/10/17 1554 03/11/17 0346 03/12/17 0442  WBC 16.4*  --  18.4* 13.7* 11.6*  NEUTROABS 14.2*  --  15.7*  --   --   HGB 16.5 17.3* 14.7 13.4 13.4  HCT 50.1 51.0 45.5 44.2 42.3  MCV 87.6  --  88.9 91.3 90.8  PLT 226  --  211 203 353   Basic Metabolic Panel:  Recent Labs Lab 03/10/17 1322 03/10/17 1330 03/11/17 0346 03/12/17 0442  NA 131* 135 135 134*  K 3.6 3.6 3.9 4.0  CL 93* 92* 101 96*  CO2 26  --  23 31  GLUCOSE 165* 162* 111* 116*  BUN 22* 29* 23* 16  CREATININE 1.31* 1.10 1.08 1.03  CALCIUM 9.0  --  7.5* 8.5*  MG  --   --  1.5*  --    GFR: Estimated Creatinine Clearance: 105 mL/min (by C-G formula based on SCr of 1.03 mg/dL). Liver Function Tests:  Recent Labs Lab 03/10/17 1322 03/11/17 0346  AST 27 13*  ALT 15* 11*  ALKPHOS 67 58  BILITOT 1.5* 1.1  PROT 8.1 7.1  ALBUMIN 3.8 3.1*   No results for input(s): LIPASE, AMYLASE in the last 168 hours. No results for input(s): AMMONIA in the last 168 hours. Coagulation Profile:  Recent Labs Lab 03/10/17 1258 03/10/17 1322 03/11/17 0346 03/12/17 0442  INR 2.3* 2.22 2.10  1.75   Cardiac Enzymes: No results for input(s): CKTOTAL, CKMB, CKMBINDEX, TROPONINI in the last 168 hours. BNP (last 3 results) No results for input(s): PROBNP in the last 8760 hours. HbA1C:  Recent Labs  03/10/17 1601 03/11/17 1229  HGBA1C 7.1* 7.2*   CBG:  Recent Labs Lab 03/11/17 0725 03/11/17 1232 03/11/17 1632 03/11/17 2154 03/12/17 0748  GLUCAP 115* 132* 125* 175* 147*   Lipid Profile: No results for input(s): CHOL, HDL, LDLCALC, TRIG, CHOLHDL, LDLDIRECT in the last 72 hours. Thyroid Function Tests: No results for input(s): TSH, T4TOTAL, FREET4, T3FREE, THYROIDAB in the last 72 hours. Anemia Panel: No results for input(s): VITAMINB12, FOLATE, FERRITIN, TIBC, IRON, RETICCTPCT in the last 72 hours. Urine analysis:    Component Value Date/Time   COLORURINE AMBER (A) 03/10/2017 1909   APPEARANCEUR HAZY (A)  03/10/2017 1909   LABSPEC 1.021 03/10/2017 1909   PHURINE 5.0 03/10/2017 1909   GLUCOSEU NEGATIVE 03/10/2017 1909   HGBUR NEGATIVE 03/10/2017 1909   BILIRUBINUR NEGATIVE 03/10/2017 1909   KETONESUR NEGATIVE 03/10/2017 1909   PROTEINUR 100 (A) 03/10/2017 1909   UROBILINOGEN 2 (H) 01/11/2013 1045   NITRITE NEGATIVE 03/10/2017 1909   LEUKOCYTESUR NEGATIVE 03/10/2017 1909     Ripudeep Rai M.D. [facility] 03/12/2017, 10:56 AM  Pager: [phone] Between 7am to 7pm - call Pager - [phone]  After 7pm go to [email or web address] - password [email or web address]  Call night coverage person covering after 7pm

## 2017-03-13 ENCOUNTER — Inpatient Hospital Stay (HOSPITAL_COMMUNITY): Payer: BLUE CROSS/BLUE SHIELD

## 2017-03-13 DIAGNOSIS — I340 Nonrheumatic mitral (valve) insufficiency: Secondary | ICD-10-CM

## 2017-03-13 DIAGNOSIS — I10 Essential (primary) hypertension: Secondary | ICD-10-CM

## 2017-03-13 LAB — CBC
HCT: 44.6 % (ref 39.0–52.0)
HEMOGLOBIN: 13.9 g/dL (ref 13.0–17.0)
MCH: 28.2 pg (ref 26.0–34.0)
MCHC: 31.2 g/dL (ref 30.0–36.0)
MCV: 90.5 fL (ref 78.0–100.0)
Platelets: 231 10*3/uL (ref 150–400)
RBC: 4.93 MIL/uL (ref 4.22–5.81)
RDW: 18.1 % — ABNORMAL HIGH (ref 11.5–15.5)
WBC: 10.4 10*3/uL (ref 4.0–10.5)

## 2017-03-13 LAB — BASIC METABOLIC PANEL
Anion gap: 11 (ref 5–15)
BUN: 14 mg/dL (ref 6–20)
CHLORIDE: 86 mmol/L — AB (ref 101–111)
CO2: 36 mmol/L — ABNORMAL HIGH (ref 22–32)
CREATININE: 1.04 mg/dL (ref 0.61–1.24)
Calcium: 9.1 mg/dL (ref 8.9–10.3)
GFR calc non Af Amer: >60 mL/min (ref >60)
Glucose, Bld: 108 mg/dL — ABNORMAL HIGH (ref 65–99)
POTASSIUM: 3.1 mmol/L — AB (ref 3.5–5.1)
SODIUM: 133 mmol/L — AB (ref 135–145)

## 2017-03-13 LAB — ECHOCARDIOGRAM COMPLETE
HEIGHTINCHES: 73 in
Weight: 4540.8 oz

## 2017-03-13 LAB — HEPARIN LEVEL (UNFRACTIONATED)
HEPARIN UNFRACTIONATED: 0.32 [IU]/mL (ref 0.30–0.70)
Heparin Unfractionated: 0.43 IU/mL (ref 0.30–0.70)

## 2017-03-13 LAB — GLUCOSE, CAPILLARY
GLUCOSE-CAPILLARY: 148 mg/dL — AB (ref 65–99)
Glucose-Capillary: 123 mg/dL — ABNORMAL HIGH (ref 65–99)
Glucose-Capillary: 143 mg/dL — ABNORMAL HIGH (ref 65–99)
Glucose-Capillary: 112 mg/dL — ABNORMAL HIGH (ref 65–99)

## 2017-03-13 LAB — MAGNESIUM: MAGNESIUM: 1.8 mg/dL (ref 1.7–2.4)

## 2017-03-13 MED ORDER — IPRATROPIUM-ALBUTEROL 0.5-2.5 (3) MG/3ML IN SOLN: 3.0000 mL | Freq: Four times a day (QID) | RESPIRATORY_TRACT | Status: DC

## 2017-03-13 MED ORDER — IPRATROPIUM-ALBUTEROL 0.5-2.5 (3) MG/3ML IN SOLN
3.0000 mL | RESPIRATORY_TRACT | Status: DC | PRN
Start: 2017-03-13 — End: 2017-03-15

## 2017-03-13 MED ORDER — POTASSIUM CHLORIDE CRYS ER 20 MEQ PO TBCR
20.0000 meq | EXTENDED_RELEASE_TABLET | Freq: Once | ORAL | Status: AC
  Administered 2017-03-13: 20 meq via ORAL
  Filled 2017-03-13: qty 1

## 2017-03-13 NOTE — Progress Notes (Signed)
Patient stable on 3L at this time. BiPAP not placed on at this time. RT will continue to monitor as needed.

## 2017-03-13 NOTE — Progress Notes (Signed)
  Echocardiogram 2D Echocardiogram has been performed.  Arvil Chaco 03/13/2017, 9:30 AM

## 2017-03-13 NOTE — Progress Notes (Signed)
[facility]                                                                              Patient Demographics  William Alvarado, is a 66 y.o. male, DOB - 02/08/1951, LTJ:[id number]  Admit date - 03/10/2017   Admitting Physician Waldemar Dickens, MD  Outpatient Primary MD for the patient is [name], MD  Outpatient specialists:   LOS - 3  days   Medical records reviewed and are as summarized below:    Chief Complaint  Patient presents with  . Code Stroke       Brief summary   Patient is a 66 year old male with chronic atrial fibrillation, COPD, DJD, diabetes, hypertension, ? Diastolic CHF, pedal edema on Lasix brought to ED after he became confused at the physician's office around 10 AM in the morning, having acute onset of shortness of breath, hypoxic with O2 sat in low 80s. Reported fevers, chills or night sweats, chronic lower activity swelling and had not taken his diuretic. WBC count 18.4, lactic acid 2.89. Chest x-ray showed cardiomegaly with mild pulmonary vascular congestion, right base infiltrate suspicious for pneumonia. Patient was admitted and eventually placed on BiPAP in the stepdown unit  Assessment & Plan    Principal Problem:   Acute respiratory failure with hypoxia (Emily), COPD, possibly has diastolic CHF,Multifocal pneumonia now has right lung pneumonia WBC count elevated at 18, chest x-ray with vascular condition and right base infiltrate. Initially placed on Venti mask, then BiPAP - Continue to wean down O2 currently on 3 L - CTA chest showed multifocal pneumonia, placed on IV vancomycin and cefepime on 6/22  Acute pulmonary embolism - CT angiogram of the chest showed small nonocclusive pulmonary embolism in the segmental and subsegmental pulmonary arteries to the lingula (patient is on warfarin prior to admission hence a warfarin failure) - Placed on IV heparin for now, warfarin discontinued - Discussed in detail with patient and his  wife about warfarin failure and likely need to be on NOAC's, discussed about risks and benefits.  - Case management consult has been placed regarding the copays of xarelto vs eliquis  Acute on chronic diastolic CHF - Prior 2-D echo in 09/2015 had shown EF of 60-65%, however chest x-ray with vascular congestion, on high-dose Lasix at home with retaining fluid - Much improved today, negative balance of 1.8 L - will continue Lasix 80 mg twice a day, received metolazone 6/22 - 2-D echo showed EF of 55%  Sepsis due to the pneumonia - Patient met sepsis criteria at the time of admission given tachycardia, tachypnea, fever, hypotension, leukocytosis. Tmax in ED 103.75F - Sepsis physiology improved  Atrial fibrillation with RVR - Rate controlled, continue current management  - Warfarin discontinued, continue IV heparin  Hypertension - BP currently soft, hold antihypertensives, continue Lasix  Diabetes mellitus  - Continue sliding scale insulin, hemoglobin A1c 7.2  Code Status: Full CODE STATUS  DVT Prophylaxis: Warfarin  Family Communication: Discussed in detail with the patient, all imaging results, lab results explained to the patient and wife at bedside  Disposition Plan: transfer to TELE   Time Spent in  minutes  35 minutes  Procedures:    Consultants:   None   Antimicrobials:   IV Zithromax 6/20 > 6/22  IV Rocephin 6/20> 6/22  IV vancomycin 6/22  IV cefepime 6/22   Medications  Scheduled Meds: . chlorhexidine  15 mL Mouth Rinse BID  . furosemide  80 mg Oral BID  . insulin aspart  0-5 Units Subcutaneous QHS  . insulin aspart  0-9 Units Subcutaneous TID WC  . mouth rinse  15 mL Mouth Rinse q12n4p  . potassium chloride SA  40 mEq Oral BID  . sodium chloride flush  3 mL Intravenous Q12H   Continuous Infusions: . ceFEPime (MAXIPIME) IV 1 g (03/13/17 1310)  . heparin 2,300 Units/hr (03/13/17 1159)  . vancomycin Stopped (03/13/17 0516)   PRN Meds:.acetaminophen  **OR** acetaminophen, bisacodyl, hydrALAZINE, ipratropium-albuterol, ondansetron **OR** ondansetron (ZOFRAN) IV, senna-docusate, traZODone   Antibiotics   Anti-infectives    Start     Dose/Rate Route Frequency Ordered Stop   03/13/17 0300  vancomycin (VANCOCIN) 1,250 mg in sodium chloride 0.9 % 250 mL IVPB     1,250 mg 166.7 mL/hr over 90 Minutes Intravenous Every 12 hours 03/12/17 1343     03/12/17 1500  vancomycin (VANCOCIN) 2,000 mg in sodium chloride 0.9 % 500 mL IVPB     2,000 mg 250 mL/hr over 120 Minutes Intravenous  Once 03/12/17 1343 03/12/17 1916   03/12/17 1400  ceFEPIme (MAXIPIME) 1 g in dextrose 5 % 50 mL IVPB     1 g 100 mL/hr over 30 Minutes Intravenous Every 8 hours 03/12/17 1343     03/11/17 1400  cefTRIAXone (ROCEPHIN) 1 g in dextrose 5 % 50 mL IVPB  Status:  Discontinued     1 g 100 mL/hr over 30 Minutes Intravenous Every 24 hours 03/10/17 1553 03/12/17 1343   03/11/17 1400  azithromycin (ZITHROMAX) 500 mg in dextrose 5 % 250 mL IVPB  Status:  Discontinued     500 mg 250 mL/hr over 60 Minutes Intravenous Every 24 hours 03/10/17 1553 03/12/17 1343   03/10/17 1345  cefTRIAXone (ROCEPHIN) 1 g in dextrose 5 % 50 mL IVPB     1 g 100 mL/hr over 30 Minutes Intravenous  Once 03/10/17 1341 03/10/17 1437   03/10/17 1345  azithromycin (ZITHROMAX) 500 mg in dextrose 5 % 250 mL IVPB     500 mg 250 mL/hr over 60 Minutes Intravenous  Once 03/10/17 1341 03/10/17 1549        Subjective:   William Alvarado was seen and examined today. Feeling a lot better today, shortness of breath is improving however still wheezing. O2 been down to 3 L via nasal cannula, no chest pain no fevers. Patient denies dizziness, chest pain, abdominal pain, N/V/D/C, new weakness, numbess, tingling. Did not need BiPAP overnight.  Objective:   Vitals:   03/13/17 0800 03/13/17 0801 03/13/17 1000 03/13/17 1200  BP: 116/81   107/76  Pulse: 93  94 92  Resp: (!) 25  20 (!) 31  Temp: 98.6 F (37 C) 99  F (37.2 C)  98.1 F (36.7 C)  TempSrc: Oral Oral  Oral  SpO2: 91%  (!) 82% 95%  Weight:      Height:        Intake/Output Summary (Last 24 hours) at 03/13/17 1354 Last data filed at 03/13/17 1318  Gross per 24 hour  Intake          1449.23 ml  Output  5925 ml  Net         -4475.77 ml     Wt Readings from Last 3 Encounters:  03/13/17 128.7 kg (283 lb 12.8 oz)  03/10/17 131 kg (288 lb 12.8 oz)  01/25/17 (!) 137.4 kg (303 lb)     Exam  General: Alert and oriented x 3, NAD  Eyes: PERRLA, EOMI  HEENT: normocephalic, atraumatic  Cardiovascular: S1-S2 clear, irregularly irregular  Respiratory: Bilateral expiratory wheezing  Gastrointestinal: Soft, nontender, nondistended, normal bowel sounds  Ext: trace pedal edema  Neuro: no new deficits  Musculoskeletal: No cyanosis, clubbing  Skin: Chronic lower extremity venous stasis changes  Psych: normal affect, alert and oriented   Data Reviewed:  I have personally reviewed following labs and imaging studies  Micro Results Recent Results (from the past 240 hour(s))  Blood Culture (routine x 2)     Status: None (Preliminary result)   Collection Time: 03/10/17  1:43 PM  Result Value Ref Range Status   Specimen Description BLOOD RIGHT ARM  Final   Special Requests   Final    BOTTLES DRAWN AEROBIC AND ANAEROBIC Blood Culture adequate volume   Culture NO GROWTH 3 DAYS  Final   Report Status PENDING  Incomplete  Blood Culture (routine x 2)     Status: None (Preliminary result)   Collection Time: 03/10/17  1:47 PM  Result Value Ref Range Status   Specimen Description BLOOD LEFT HAND  Final   Special Requests   Final    BOTTLES DRAWN AEROBIC AND ANAEROBIC Blood Culture adequate volume   Culture NO GROWTH 3 DAYS  Final   Report Status PENDING  Incomplete  MRSA PCR Screening     Status: None   Collection Time: 03/10/17  5:27 PM  Result Value Ref Range Status   MRSA by PCR NEGATIVE NEGATIVE Final    Comment:         The GeneXpert MRSA Assay (FDA approved for NASAL specimens only), is one component of a comprehensive MRSA colonization surveillance program. It is not intended to diagnose MRSA infection nor to guide or monitor treatment for MRSA infections.   Urine culture     Status: None   Collection Time: 03/10/17  7:10 PM  Result Value Ref Range Status   Specimen Description URINE, RANDOM  Final   Special Requests NONE  Final   Culture NO GROWTH  Final   Report Status 03/11/2017 FINAL  Final  Respiratory Panel by PCR     Status: None   Collection Time: 03/10/17  9:00 PM  Result Value Ref Range Status   Adenovirus NOT DETECTED NOT DETECTED Final   Coronavirus 229E NOT DETECTED NOT DETECTED Final   Coronavirus HKU1 NOT DETECTED NOT DETECTED Final   Coronavirus NL63 NOT DETECTED NOT DETECTED Final   Coronavirus OC43 NOT DETECTED NOT DETECTED Final   Metapneumovirus NOT DETECTED NOT DETECTED Final   Rhinovirus / Enterovirus NOT DETECTED NOT DETECTED Final   Influenza A NOT DETECTED NOT DETECTED Final   Influenza B NOT DETECTED NOT DETECTED Final   Parainfluenza Virus 1 NOT DETECTED NOT DETECTED Final   Parainfluenza Virus 2 NOT DETECTED NOT DETECTED Final   Parainfluenza Virus 3 NOT DETECTED NOT DETECTED Final   Parainfluenza Virus 4 NOT DETECTED NOT DETECTED Final   Respiratory Syncytial Virus NOT DETECTED NOT DETECTED Final   Bordetella pertussis NOT DETECTED NOT DETECTED Final   Chlamydophila pneumoniae NOT DETECTED NOT DETECTED Final   Mycoplasma pneumoniae NOT DETECTED NOT  DETECTED Final    Radiology Reports Ct Angio Chest Pe W Or Wo Contrast  Result Date: 03/12/2017 CLINICAL DATA:  Hypoxia EXAM: CT ANGIOGRAPHY CHEST WITH CONTRAST TECHNIQUE: Multidetector CT imaging of the chest was performed using the standard protocol during bolus administration of intravenous contrast. Multiplanar CT image reconstructions and MIPs were obtained to evaluate the vascular anatomy. CONTRAST:   100 cc Isovue 370 COMPARISON:  Chest x-ray 05/11/2017 FINDINGS: Cardiovascular: The heart is enlarged. No pericardial effusion. Mitral annular calcification is evident. Coronary artery calcification is noted. Atherosclerotic calcification is noted in the wall of the thoracic aorta. No large central pulmonary embolus is evident. There is a nonobstructive filling defects in segmental and subsegmental pulmonary arteries to the lingula. Mediastinum/Nodes: No mediastinal lymphadenopathy. 12 mm short axis right hilar lymph node is associated with any 17 mm short axis lymph node in the right hilum. The esophagus has normal imaging features. There is no axillary lymphadenopathy. Lungs/Pleura: Patchy ground-glass attenuation is identified in both lungs, right greater than left. This has a somewhat central and dependent distribution. Focal airspace consolidation noted inferior right middle lobe and posterior right lower lobe. No substantial pleural effusion. Upper abdomen:  Unremarkable. Musculoskeletal: Bone windows reveal no worrisome lytic or sclerotic osseous lesions. Review of the MIP images confirms the above findings. IMPRESSION: 1. Small nonocclusive pulmonary embolus identified in segmental and subsegmental pulmonary arteries to the lingula. 2. Patchy airspace disease bilaterally, right greater than left, suspicious for multifocal pneumonia. 3. Mild bilateral hilar lymphadenopathy, right greater than left. This may be reactive, but follow-up recommended to ensure resolution. Critical Value/emergent results were called by me at the time of interpretation on 03/12/2017 at 1:08 pm to Dr. Nira Conn Danila Eddie , who verbally acknowledged these results. Electronically Signed   By: Misty Stanley M.D.   On: 03/12/2017 13:09   Dg Chest Port 1 View  Result Date: 03/11/2017 CLINICAL DATA:  Cough and fever EXAM: PORTABLE CHEST 1 VIEW COMPARISON:  March 10, 2017 FINDINGS: Patchy infiltrate remains in the right base. There is slight  left base atelectasis. Lungs elsewhere clear. Cardiomegaly with pulmonary venous hypertension persists. No adenopathy. No bone lesions. IMPRESSION: Persistent pulmonary vascular congestion. Patchy right base infiltrate, suspicious for pneumonia. Mild left base atelectasis. Electronically Signed   By: Lowella Grip III M.D.   On: 03/11/2017 08:29   Dg Chest Port 1 View  Result Date: 03/10/2017 CLINICAL DATA:  Stroke.  History of atrial fibrillation and COPD. EXAM: PORTABLE CHEST 1 VIEW COMPARISON:  No recent prior. FINDINGS: Cardiomegaly with mild pulmonary vascular prominence. Right base infiltrate. Right base pneumonia cannot be excluded. No pleural effusion or pneumothorax. No acute bony abnormality. IMPRESSION: 1.  Cardiomegaly with mild pulmonary vascular congestion. 2. Right base infiltrate.  Right base pneumonia cannot be excluded . Electronically Signed   By: Marcello Moores  Register   On: 03/10/2017 14:00   Ct Head Code Stroke W/o Cm  Result Date: 03/10/2017 CLINICAL DATA:  Code stroke.  Confusion.  Expressive aphasia EXAM: CT HEAD WITHOUT CONTRAST TECHNIQUE: Contiguous axial images were obtained from the base of the skull through the vertex without intravenous contrast. COMPARISON:  None. FINDINGS: Brain: No evidence of acute infarction, hemorrhage, hydrocephalus, extra-axial collection or mass lesion/mass effect. Vascular: No hyperdense vessel or unexpected calcification. Skull: Negative Sinuses/Orbits: Negative Other: None ASPECTS ([place] Stroke Program Early CT Score) - Ganglionic level infarction (caudate, lentiform nuclei, internal capsule, insula, M1-M3 cortex): 7 - Supraganglionic infarction (M4-M6 cortex): 3 Total score (0-10 with 10 being normal):  10 IMPRESSION: 1. Negative CT head 2. ASPECTS is 10 These results were called by telephone at the time of interpretation on 03/10/2017 at 1:36 pm to Dr. Leonel Ramsay, who verbally acknowledged these results. Electronically Signed   By: Franchot Gallo  M.D.   On: 03/10/2017 13:36    Lab Data:  CBC:  Recent Labs Lab 03/10/17 1322 03/10/17 1330 03/10/17 1554 03/11/17 0346 03/12/17 0442 03/13/17 0344  WBC 16.4*  --  18.4* 13.7* 11.6* 10.4  NEUTROABS 14.2*  --  15.7*  --   --   --   HGB 16.5 17.3* 14.7 13.4 13.4 13.9  HCT 50.1 51.0 45.5 44.2 42.3 44.6  MCV 87.6  --  88.9 91.3 90.8 90.5  PLT 226  --  211 203 203 711   Basic Metabolic Panel:  Recent Labs Lab 03/10/17 1322 03/10/17 1330 03/11/17 0346 03/12/17 0442 03/13/17 0344  NA 131* 135 135 134* 133*  K 3.6 3.6 3.9 4.0 3.1*  CL 93* 92* 101 96* 86*  CO2 26  --  23 31 36*  GLUCOSE 165* 162* 111* 116* 108*  BUN 22* 29* 23* 16 14  CREATININE 1.31* 1.10 1.08 1.03 1.04  CALCIUM 9.0  --  7.5* 8.5* 9.1  MG  --   --  1.5*  --  1.8   GFR: Estimated Creatinine Clearance: 102.2 mL/min (by C-G formula based on SCr of 1.04 mg/dL). Liver Function Tests:  Recent Labs Lab 03/10/17 1322 03/11/17 0346  AST 27 13*  ALT 15* 11*  ALKPHOS 67 58  BILITOT 1.5* 1.1  PROT 8.1 7.1  ALBUMIN 3.8 3.1*   No results for input(s): LIPASE, AMYLASE in the last 168 hours. No results for input(s): AMMONIA in the last 168 hours. Coagulation Profile:  Recent Labs Lab 03/10/17 1258 03/10/17 1322 03/11/17 0346 03/12/17 0442  INR 2.3* 2.22 2.10 1.75   Cardiac Enzymes: No results for input(s): CKTOTAL, CKMB, CKMBINDEX, TROPONINI in the last 168 hours. BNP (last 3 results) No results for input(s): PROBNP in the last 8760 hours. HbA1C:  Recent Labs  03/10/17 1601 03/11/17 1229  HGBA1C 7.1* 7.2*   CBG:  Recent Labs Lab 03/12/17 1139 03/12/17 1542 03/12/17 2123 03/13/17 0759 03/13/17 1223  GLUCAP 161* 176* 147* 123* 148*   Lipid Profile: No results for input(s): CHOL, HDL, LDLCALC, TRIG, CHOLHDL, LDLDIRECT in the last 72 hours. Thyroid Function Tests: No results for input(s): TSH, T4TOTAL, FREET4, T3FREE, THYROIDAB in the last 72 hours. Anemia Panel: No results for  input(s): VITAMINB12, FOLATE, FERRITIN, TIBC, IRON, RETICCTPCT in the last 72 hours. Urine analysis:    Component Value Date/Time   COLORURINE AMBER (A) 03/10/2017 1909   APPEARANCEUR HAZY (A) 03/10/2017 1909   LABSPEC 1.021 03/10/2017 1909   PHURINE 5.0 03/10/2017 1909   GLUCOSEU NEGATIVE 03/10/2017 1909   HGBUR NEGATIVE 03/10/2017 1909   BILIRUBINUR NEGATIVE 03/10/2017 1909   KETONESUR NEGATIVE 03/10/2017 1909   PROTEINUR 100 (A) 03/10/2017 1909   UROBILINOGEN 2 (H) 01/11/2013 1045   NITRITE NEGATIVE 03/10/2017 1909   LEUKOCYTESUR NEGATIVE 03/10/2017 1909     Safari Cinque M.D. [facility] 03/13/2017, 1:54 PM  Pager: [phone] Between 7am to 7pm - call Pager - [phone]  After 7pm go to [email or web address] - password [email or web address]  Call night coverage person covering after 7pm

## 2017-03-13 NOTE — Progress Notes (Signed)
ANTICOAGULATION CONSULT NOTE - Follow Up Consult  Pharmacy Consult for Heparin Indication: pulmonary embolus  No Known Allergies  Patient Measurements: Height: 6\' 1"  (185.4 cm) Weight: 283 lb 12.8 oz (128.7 kg) IBW/kg (Calculated) : 79.9 Heparin Dosing Weight: 110kg  Vital Signs: Temp: 98.4 F (36.9 C) (06/23 1600) Temp Source: Oral (06/23 1600) BP: 111/78 (06/23 1600) Pulse Rate: 101 (06/23 1600)  Labs:  Recent Labs  03/11/17 0346 03/12/17 0442 03/12/17 2138 03/13/17 0344 03/13/17 1506  HGB 13.4 13.4  --  13.9  --   HCT 44.2 42.3  --  44.6  --   PLT 203 203  --  231  --   LABPROT 23.9* 20.6*  --   --   --   INR 2.10 1.75  --   --   --   HEPARINUNFRC  --   --  0.28* 0.32 0.43  CREATININE 1.08 1.03  --  1.04  --     Estimated Creatinine Clearance: 102.2 mL/min (by C-G formula based on SCr of 1.04 mg/dL).   Medications:  Heparin @ 2300 units/hr  Assessment: 65yom continues on heparin for new PE while coumadin on hold. Confirmatory heparin level is therapeutic at 0.43. No bleeding.  Goal of Therapy:  Heparin level 0.3-0.7 units/ml Monitor platelets by anticoagulation protocol: Yes   Plan:  1) Continue heparin at 2300 units/hr 2) Follow up daily heparin leel and CBC  Fredrik Rigger 03/13/2017,4:27 PM

## 2017-03-13 NOTE — Progress Notes (Signed)
ANTICOAGULATION CONSULT NOTE - Follow Up Consult  Pharmacy Consult for  heparin gtt Indication: Acute pulmonary embolus  No Known Allergies  Patient Measurements: Height: 6\' 1"  (185.4 cm) Weight: 283 lb 12.8 oz (128.7 kg) IBW/kg (Calculated) : 79.9 Heparin Dosing Weight: 110 kg  Vital Signs: Temp: 97.9 F (36.6 C) (06/23 0721) Temp Source: Oral (06/23 0721) BP: 117/80 (06/23 0721) Pulse Rate: 111 (06/23 0721)  Labs:  Recent Labs  03/10/17 1322  03/11/17 0346 03/12/17 0442 03/12/17 2138 03/13/17 0344  HGB 16.5  < > 13.4 13.4  --  13.9  HCT 50.1  < > 44.2 42.3  --  44.6  PLT 226  < > 203 203  --  231  APTT 44*  --   --   --   --   --   LABPROT 25.0*  --  23.9* 20.6*  --   --   INR 2.22  --  2.10 1.75  --   --   HEPARINUNFRC  --   --   --   --  0.28* 0.32  CREATININE 1.31*  < > 1.08 1.03  --  1.04  < > = values in this interval not displayed.  Estimated Creatinine Clearance: 102.2 mL/min (by C-G formula based on SCr of 1.04 mg/dL).   Medications:  Prescriptions Prior to Admission  Medication Sig Dispense Refill Last Dose  . fish oil-omega-3 fatty acids 1000 MG capsule Take 1 g by mouth daily.   03/10/2017 at Unknown time  . furosemide (LASIX) 40 MG tablet TAKE 3 TABLETS BY MOUTH TWICE A DAY (Patient taking differently: TAKE 2 TABLETS BY MOUTH IN THE MORNING AND 1 TABLET BY MOUTH IN THE EVENING) 180 tablet 3 03/09/2017 at Unknown time  . metFORMIN (GLUCOPHAGE) 1000 MG tablet TAKE ONE TABLET BY MOUTH TWICE DAILY 60 tablet 5 03/10/2017 at Unknown time  . metolazone (ZAROXOLYN) 5 MG tablet Take 1 tablet (5 mg total) by mouth daily. Take 1 pill 30 minutes before lasix in am (Patient taking differently: Take 5 mg by mouth once a week. Take 1 pill 30 minutes before morning medications on Mondays) 30 tablet 0 Past Week at Unknown time  . metoprolol (LOPRESSOR) 50 MG tablet TAKE ONE TABLET BY MOUTH TWICE DAILY 60 tablet 11 03/10/2017 at 730  . potassium chloride SA (KLOR-CON M20) 20  MEQ tablet 2 tabs po bid (Patient taking differently: Take 40 mEq by mouth 2 (two) times daily. 2 tabs po bid) 120 tablet 11 03/10/2017 at Unknown time  . warfarin (COUMADIN) 5 MG tablet Take 5-10 mg by mouth See admin instructions. Pt takes 5 mg twice a day on Tuesday, Thursday, Saturday and Sunday; Pt takes 5 mg once a day on Monday, Wednesday and Friday   03/10/2017 at 0730  . naproxen sodium (ANAPROX) 220 MG tablet Take 220-440 mg by mouth See admin instructions. Take 220 mg 440 mg as needed for pain   PRN  . warfarin (COUMADIN) 5 MG tablet TAKE AS DIRECTED (Patient not taking: Reported on 03/10/2017) 90 tablet 5 Not Taking at 730    Assessment: 66 yo M admitted 03/10/2017 on warfarin prior to admission for Afib. Patient with new acute PE and concern for multifocal pneumonia. Patient was previously on warfarin, which has been stopped. -HL therapeutic at 0.32, but given we are treating PE, I would like to see this higher -CBC stable  Goal of Therapy:  Heparin level 0.3-0.7 units/ml Monitor platelets by anticoagulation protocol: Yes   Plan:  -  Increase heparin to 2300 units/hr - 6 hour heparin level - Monitor daily heparin level, CBC and signs/symptoms of bleeding - Follow up plans to change to oral anticoagulant (likely DOAC per Dr. Isidoro Donning)  Carroll Sage), PharmD  [id number] Pharmacy Resident Pager: [phone] 03/13/2017 9:02 AM

## 2017-03-14 LAB — GLUCOSE, CAPILLARY
GLUCOSE-CAPILLARY: 149 mg/dL — AB (ref 65–99)
Glucose-Capillary: 128 mg/dL — ABNORMAL HIGH (ref 65–99)
Glucose-Capillary: 122 mg/dL — ABNORMAL HIGH (ref 65–99)
Glucose-Capillary: 132 mg/dL — ABNORMAL HIGH (ref 65–99)

## 2017-03-14 LAB — BASIC METABOLIC PANEL
Anion gap: 13 (ref 5–15)
BUN: 20 mg/dL (ref 6–20)
CO2: 37 mmol/L — ABNORMAL HIGH (ref 22–32)
Calcium: 9.5 mg/dL (ref 8.9–10.3)
Chloride: 85 mmol/L — ABNORMAL LOW (ref 101–111)
Creatinine, Ser: 1.04 mg/dL (ref 0.61–1.24)
GFR calc Af Amer: >60 mL/min (ref >60)
GLUCOSE: 113 mg/dL — AB (ref 65–99)
POTASSIUM: 3.3 mmol/L — AB (ref 3.5–5.1)
Sodium: 135 mmol/L (ref 135–145)

## 2017-03-14 LAB — CBC
HCT: 47 % (ref 39.0–52.0)
Hemoglobin: 15 g/dL (ref 13.0–17.0)
MCH: 28.3 pg (ref 26.0–34.0)
MCHC: 31.9 g/dL (ref 30.0–36.0)
MCV: 88.7 fL (ref 78.0–100.0)
PLATELETS: 218 10*3/uL (ref 150–400)
RBC: 5.3 MIL/uL (ref 4.22–5.81)
RDW: 17.6 % — AB (ref 11.5–15.5)
WBC: 9.4 10*3/uL (ref 4.0–10.5)

## 2017-03-14 LAB — HEPARIN LEVEL (UNFRACTIONATED): Heparin Unfractionated: 0.56 IU/mL (ref 0.30–0.70)

## 2017-03-14 NOTE — Care Management (Signed)
MD to fax NOAC prescriptions to pharmacy to determine copay amount - due to weekend and inability to perform benefit check

## 2017-03-14 NOTE — Progress Notes (Signed)
[facility]                                                                              Patient Demographics  William Alvarado, is a 66 y.o. male, DOB - 1951-09-14, FXT:[id number]  Admit date - 03/10/2017   Admitting Physician Waldemar Dickens, MD  Outpatient Primary MD for the patient is [name], MD  Outpatient specialists:   LOS - 4  days   Medical records reviewed and are as summarized below:    Chief Complaint  Patient presents with  . Code Stroke       Brief summary   Patient is a 66 year old male with chronic atrial fibrillation, COPD, DJD, diabetes, hypertension, ? Diastolic CHF, pedal edema on Lasix brought to ED after he became confused at the physician's office around 10 AM in the morning, having acute onset of shortness of breath, hypoxic with O2 sat in low 80s. Reported fevers, chills or night sweats, chronic lower activity swelling and had not taken his diuretic. WBC count 18.4, lactic acid 2.89. Chest x-ray showed cardiomegaly with mild pulmonary vascular congestion, right base infiltrate suspicious for pneumonia. Patient was admitted and eventually placed on BiPAP in the stepdown unit  Assessment & Plan    Principal Problem:   Acute respiratory failure with hypoxia ([place]), COPD, possibly has diastolic CHF,Multifocal pneumonia now has right lung pneumonia WBC count elevated at 18, chest x-ray with vascular condition and right base infiltrate. Initially placed on Venti mask, then BiPAP - Improving, continue to wean O2 as tolerated  - Currently sats 93% on 3 L. Will need home O2 evaluation at discharge.  - CTA chest showed multifocal pneumonia, placed on IV vancomycin and cefepime on 6/22  Acute pulmonary embolism - CT angiogram of the chest showed small nonocclusive pulmonary embolism in the segmental and subsegmental pulmonary arteries to the lingula (patient is on warfarin prior to admission hence a warfarin failure) - Continue IV  warfarin per pharmacy, warfarin discontinued. - Case management consult has been placed regarding the copays of xarelto vs eliquis, awaiting copay results  Acute on chronic diastolic CHF - Prior 2-D echo in 09/2015 had shown EF of 60-65%, however chest x-ray with vascular congestion, on high-dose Lasix at home with retaining fluid -Improving negative balance of 4 L, weight down from 298 on 6/21 -> 280 lbs  - continue Lasix 80 mg twice a day, received metolazone 6/22 - 2-D echo showed EF of 55%  Sepsis due to the pneumonia - Patient met sepsis criteria at the time of admission given tachycardia, tachypnea, fever, hypotension, leukocytosis. Tmax in ED 103.1F - Sepsis physiology improved  Atrial fibrillation with RVR - Rate controlled, continue current management  - Warfarin discontinued, continue IV heparin - Awaiting decision about the co-pays xarelto vs eliquis   Hypertension - BP currently soft, hold antihypertensives, continue Lasix  Diabetes mellitus  - Continue sliding scale insulin, hemoglobin A1c 7.2  Hypokalemia - Continue scheduled potassium replacement  Code Status: Full CODE STATUS  DVT Prophylaxis: Warfarin  Family Communication: Discussed in detail with the patient, all imaging results, lab results explained to the patient  Disposition Plan: Awaiting case management decision regarding co-pays, will need home O2 evaluation, likely DC home in a.m.  Time Spent in minutes 25 minutes  Procedures:    Consultants:   None   Antimicrobials:   IV Zithromax 6/20 > 6/22  IV Rocephin 6/20> 6/22  IV vancomycin 6/22  IV cefepime 6/22   Medications  Scheduled Meds: . chlorhexidine  15 mL Mouth Rinse BID  . furosemide  80 mg Oral BID  . insulin aspart  0-5 Units Subcutaneous QHS  . insulin aspart  0-9 Units Subcutaneous TID WC  . mouth rinse  15 mL Mouth Rinse q12n4p  . potassium chloride SA  40 mEq Oral BID  . sodium chloride flush  3 mL Intravenous Q12H    Continuous Infusions: . ceFEPime (MAXIPIME) IV 1 g (03/14/17 0600)  . heparin 2,300 Units/hr (03/14/17 1035)  . vancomycin 1,250 mg (03/14/17 0405)   PRN Meds:.acetaminophen **OR** acetaminophen, bisacodyl, hydrALAZINE, ipratropium-albuterol, ondansetron **OR** ondansetron (ZOFRAN) IV, senna-docusate, traZODone   Antibiotics   Anti-infectives    Start     Dose/Rate Route Frequency Ordered Stop   03/13/17 0300  vancomycin (VANCOCIN) 1,250 mg in sodium chloride 0.9 % 250 mL IVPB     1,250 mg 166.7 mL/hr over 90 Minutes Intravenous Every 12 hours 03/12/17 1343     03/12/17 1500  vancomycin (VANCOCIN) 2,000 mg in sodium chloride 0.9 % 500 mL IVPB     2,000 mg 250 mL/hr over 120 Minutes Intravenous  Once 03/12/17 1343 03/12/17 1916   03/12/17 1400  ceFEPIme (MAXIPIME) 1 g in dextrose 5 % 50 mL IVPB     1 g 100 mL/hr over 30 Minutes Intravenous Every 8 hours 03/12/17 1343     03/11/17 1400  cefTRIAXone (ROCEPHIN) 1 g in dextrose 5 % 50 mL IVPB  Status:  Discontinued     1 g 100 mL/hr over 30 Minutes Intravenous Every 24 hours 03/10/17 1553 03/12/17 1343   03/11/17 1400  azithromycin (ZITHROMAX) 500 mg in dextrose 5 % 250 mL IVPB  Status:  Discontinued     500 mg 250 mL/hr over 60 Minutes Intravenous Every 24 hours 03/10/17 1553 03/12/17 1343   03/10/17 1345  cefTRIAXone (ROCEPHIN) 1 g in dextrose 5 % 50 mL IVPB     1 g 100 mL/hr over 30 Minutes Intravenous  Once 03/10/17 1341 03/10/17 1437   03/10/17 1345  azithromycin (ZITHROMAX) 500 mg in dextrose 5 % 250 mL IVPB     500 mg 250 mL/hr over 60 Minutes Intravenous  Once 03/10/17 1341 03/10/17 1549        Subjective:   William Alvarado was seen and examined today. Feeling better, shortness of breath has significantly improved from admission, sats improving, on 3 L O2, no fevers or chills.  Patient denies dizziness, chest pain, abdominal pain, N/V/D/C, new weakness, numbess, tingling  Objective:   Vitals:   03/13/17 2021  03/13/17 2205 03/13/17 2219 03/14/17 0534  BP: 114/89  134/84 105/75  Pulse: (!) 108  94 100  Resp: (!) '24  18 18  '$ Temp: 98.4 F (36.9 C)  98.6 F (37 C) 98.5 F (36.9 C)  TempSrc: Oral     SpO2: 94%  95% 94%  Weight:  127 kg (280 lb)    Height:  '6\' 1"'$  (1.854 m)      Intake/Output Summary (Last 24 hours) at 03/14/17 1143 Last data filed at 03/14/17 0910  Gross per 24 hour  Intake  640 ml  Output             3325 ml  Net            -2685 ml     Wt Readings from Last 3 Encounters:  03/13/17 127 kg (280 lb)  03/10/17 131 kg (288 lb 12.8 oz)  01/25/17 (!) 137.4 kg (303 lb)     Exam  General: Alert and oriented x 3, NAD  Eyes: PERRLA, EOMI  HEENT: normocephalic, atraumatic  Cardiovascular: S1-S2 clear, irregularly irregular  Respiratory: Fairly clear to auscultation bilaterally  Gastrointestinal: Soft, NT, ND, NBS  Ext: trace pedal edema bilaterally  Neuro: no new deficits  Musculoskeletal: No cyanosis and clubbing  Skin: Chronic lower extremity venous stasis changes  Psych: alert and oriented 3, normal affect   Data Reviewed:  I have personally reviewed following labs and imaging studies  Micro Results Recent Results (from the past 240 hour(s))  Blood Culture (routine x 2)     Status: None (Preliminary result)   Collection Time: 03/10/17  1:43 PM  Result Value Ref Range Status   Specimen Description BLOOD RIGHT ARM  Final   Special Requests   Final    BOTTLES DRAWN AEROBIC AND ANAEROBIC Blood Culture adequate volume   Culture NO GROWTH 4 DAYS  Final   Report Status PENDING  Incomplete  Blood Culture (routine x 2)     Status: None (Preliminary result)   Collection Time: 03/10/17  1:47 PM  Result Value Ref Range Status   Specimen Description BLOOD LEFT HAND  Final   Special Requests   Final    BOTTLES DRAWN AEROBIC AND ANAEROBIC Blood Culture adequate volume   Culture NO GROWTH 4 DAYS  Final   Report Status PENDING  Incomplete  MRSA  PCR Screening     Status: None   Collection Time: 03/10/17  5:27 PM  Result Value Ref Range Status   MRSA by PCR NEGATIVE NEGATIVE Final    Comment:        The GeneXpert MRSA Assay (FDA approved for NASAL specimens only), is one component of a comprehensive MRSA colonization surveillance program. It is not intended to diagnose MRSA infection nor to guide or monitor treatment for MRSA infections.   Urine culture     Status: None   Collection Time: 03/10/17  7:10 PM  Result Value Ref Range Status   Specimen Description URINE, RANDOM  Final   Special Requests NONE  Final   Culture NO GROWTH  Final   Report Status 03/11/2017 FINAL  Final  Respiratory Panel by PCR     Status: None   Collection Time: 03/10/17  9:00 PM  Result Value Ref Range Status   Adenovirus NOT DETECTED NOT DETECTED Final   Coronavirus 229E NOT DETECTED NOT DETECTED Final   Coronavirus HKU1 NOT DETECTED NOT DETECTED Final   Coronavirus NL63 NOT DETECTED NOT DETECTED Final   Coronavirus OC43 NOT DETECTED NOT DETECTED Final   Metapneumovirus NOT DETECTED NOT DETECTED Final   Rhinovirus / Enterovirus NOT DETECTED NOT DETECTED Final   Influenza A NOT DETECTED NOT DETECTED Final   Influenza B NOT DETECTED NOT DETECTED Final   Parainfluenza Virus 1 NOT DETECTED NOT DETECTED Final   Parainfluenza Virus 2 NOT DETECTED NOT DETECTED Final   Parainfluenza Virus 3 NOT DETECTED NOT DETECTED Final   Parainfluenza Virus 4 NOT DETECTED NOT DETECTED Final   Respiratory Syncytial Virus NOT DETECTED NOT DETECTED Final   Bordetella pertussis NOT  DETECTED NOT DETECTED Final   Chlamydophila pneumoniae NOT DETECTED NOT DETECTED Final   Mycoplasma pneumoniae NOT DETECTED NOT DETECTED Final    Radiology Reports Ct Angio Chest Pe W Or Wo Contrast  Result Date: 03/12/2017 CLINICAL DATA:  Hypoxia EXAM: CT ANGIOGRAPHY CHEST WITH CONTRAST TECHNIQUE: Multidetector CT imaging of the chest was performed using the standard protocol during  bolus administration of intravenous contrast. Multiplanar CT image reconstructions and MIPs were obtained to evaluate the vascular anatomy. CONTRAST:  100 cc Isovue 370 COMPARISON:  Chest x-ray 05/11/2017 FINDINGS: Cardiovascular: The heart is enlarged. No pericardial effusion. Mitral annular calcification is evident. Coronary artery calcification is noted. Atherosclerotic calcification is noted in the wall of the thoracic aorta. No large central pulmonary embolus is evident. There is a nonobstructive filling defects in segmental and subsegmental pulmonary arteries to the lingula. Mediastinum/Nodes: No mediastinal lymphadenopathy. 12 mm short axis right hilar lymph node is associated with any 17 mm short axis lymph node in the right hilum. The esophagus has normal imaging features. There is no axillary lymphadenopathy. Lungs/Pleura: Patchy ground-glass attenuation is identified in both lungs, right greater than left. This has a somewhat central and dependent distribution. Focal airspace consolidation noted inferior right middle lobe and posterior right lower lobe. No substantial pleural effusion. Upper abdomen:  Unremarkable. Musculoskeletal: Bone windows reveal no worrisome lytic or sclerotic osseous lesions. Review of the MIP images confirms the above findings. IMPRESSION: 1. Small nonocclusive pulmonary embolus identified in segmental and subsegmental pulmonary arteries to the lingula. 2. Patchy airspace disease bilaterally, right greater than left, suspicious for multifocal pneumonia. 3. Mild bilateral hilar lymphadenopathy, right greater than left. This may be reactive, but follow-up recommended to ensure resolution. Critical Value/emergent results were called by me at the time of interpretation on 03/12/2017 at 1:08 pm to Dr. Andres Labrum Doral Digangi , who verbally acknowledged these results. Electronically Signed   By: Kennith Center M.D.   On: 03/12/2017 13:09   Dg Chest Port 1 View  Result Date: 03/11/2017 CLINICAL  DATA:  Cough and fever EXAM: PORTABLE CHEST 1 VIEW COMPARISON:  March 10, 2017 FINDINGS: Patchy infiltrate remains in the right base. There is slight left base atelectasis. Lungs elsewhere clear. Cardiomegaly with pulmonary venous hypertension persists. No adenopathy. No bone lesions. IMPRESSION: Persistent pulmonary vascular congestion. Patchy right base infiltrate, suspicious for pneumonia. Mild left base atelectasis. Electronically Signed   By: Bretta Bang III M.D.   On: 03/11/2017 08:29   Dg Chest Port 1 View  Result Date: 03/10/2017 CLINICAL DATA:  Stroke.  History of atrial fibrillation and COPD. EXAM: PORTABLE CHEST 1 VIEW COMPARISON:  No recent prior. FINDINGS: Cardiomegaly with mild pulmonary vascular prominence. Right base infiltrate. Right base pneumonia cannot be excluded. No pleural effusion or pneumothorax. No acute bony abnormality. IMPRESSION: 1.  Cardiomegaly with mild pulmonary vascular congestion. 2. Right base infiltrate.  Right base pneumonia cannot be excluded . Electronically Signed   By: Maisie Fus  Register   On: 03/10/2017 14:00   Ct Head Code Stroke W/o Cm  Result Date: 03/10/2017 CLINICAL DATA:  Code stroke.  Confusion.  Expressive aphasia EXAM: CT HEAD WITHOUT CONTRAST TECHNIQUE: Contiguous axial images were obtained from the base of the skull through the vertex without intravenous contrast. COMPARISON:  None. FINDINGS: Brain: No evidence of acute infarction, hemorrhage, hydrocephalus, extra-axial collection or mass lesion/mass effect. Vascular: No hyperdense vessel or unexpected calcification. Skull: Negative Sinuses/Orbits: Negative Other: None ASPECTS ([place] Stroke Program Early CT Score) - Ganglionic level infarction (caudate, lentiform  nuclei, internal capsule, insula, M1-M3 cortex): 7 - Supraganglionic infarction (M4-M6 cortex): 3 Total score (0-10 with 10 being normal): 10 IMPRESSION: 1. Negative CT head 2. ASPECTS is 10 These results were called by telephone at the  time of interpretation on 03/10/2017 at 1:36 pm to Dr. Leonel Ramsay, who verbally acknowledged these results. Electronically Signed   By: Franchot Gallo M.D.   On: 03/10/2017 13:36    Lab Data:  CBC:  Recent Labs Lab 03/10/17 1322  03/10/17 1554 03/11/17 0346 03/12/17 0442 03/13/17 0344 03/14/17 0557  WBC 16.4*  --  18.4* 13.7* 11.6* 10.4 9.4  NEUTROABS 14.2*  --  15.7*  --   --   --   --   HGB 16.5  < > 14.7 13.4 13.4 13.9 15.0  HCT 50.1  < > 45.5 44.2 42.3 44.6 47.0  MCV 87.6  --  88.9 91.3 90.8 90.5 88.7  PLT 226  --  211 203 203 231 218  < > = values in this interval not displayed. Basic Metabolic Panel:  Recent Labs Lab 03/10/17 1322 03/10/17 1330 03/11/17 0346 03/12/17 0442 03/13/17 0344 03/14/17 0557  NA 131* 135 135 134* 133* 135  K 3.6 3.6 3.9 4.0 3.1* 3.3*  CL 93* 92* 101 96* 86* 85*  CO2 26  --  23 31 36* 37*  GLUCOSE 165* 162* 111* 116* 108* 113*  BUN 22* 29* 23* '16 14 20  '$ CREATININE 1.31* 1.10 1.08 1.03 1.04 1.04  CALCIUM 9.0  --  7.5* 8.5* 9.1 9.5  MG  --   --  1.5*  --  1.8  --    GFR: Estimated Creatinine Clearance: 101.5 mL/min (by C-G formula based on SCr of 1.04 mg/dL). Liver Function Tests:  Recent Labs Lab 03/10/17 1322 03/11/17 0346  AST 27 13*  ALT 15* 11*  ALKPHOS 67 58  BILITOT 1.5* 1.1  PROT 8.1 7.1  ALBUMIN 3.8 3.1*   No results for input(s): LIPASE, AMYLASE in the last 168 hours. No results for input(s): AMMONIA in the last 168 hours. Coagulation Profile:  Recent Labs Lab 03/10/17 1258 03/10/17 1322 03/11/17 0346 03/12/17 0442  INR 2.3* 2.22 2.10 1.75   Cardiac Enzymes: No results for input(s): CKTOTAL, CKMB, CKMBINDEX, TROPONINI in the last 168 hours. BNP (last 3 results) No results for input(s): PROBNP in the last 8760 hours. HbA1C:  Recent Labs  03/11/17 1229  HGBA1C 7.2*   CBG:  Recent Labs Lab 03/13/17 0759 03/13/17 1223 03/13/17 1636 03/13/17 2226 03/14/17 0829  GLUCAP 123* 148* 143* 112* 128*    Lipid Profile: No results for input(s): CHOL, HDL, LDLCALC, TRIG, CHOLHDL, LDLDIRECT in the last 72 hours. Thyroid Function Tests: No results for input(s): TSH, T4TOTAL, FREET4, T3FREE, THYROIDAB in the last 72 hours. Anemia Panel: No results for input(s): VITAMINB12, FOLATE, FERRITIN, TIBC, IRON, RETICCTPCT in the last 72 hours. Urine analysis:    Component Value Date/Time   COLORURINE AMBER (A) 03/10/2017 1909   APPEARANCEUR HAZY (A) 03/10/2017 1909   LABSPEC 1.021 03/10/2017 1909   PHURINE 5.0 03/10/2017 1909   GLUCOSEU NEGATIVE 03/10/2017 1909   HGBUR NEGATIVE 03/10/2017 1909   BILIRUBINUR NEGATIVE 03/10/2017 1909   KETONESUR NEGATIVE 03/10/2017 1909   PROTEINUR 100 (A) 03/10/2017 1909   UROBILINOGEN 2 (H) 01/11/2013 1045   NITRITE NEGATIVE 03/10/2017 1909   LEUKOCYTESUR NEGATIVE 03/10/2017 1909     Temeca Somma M.D. [facility] 03/14/2017, 11:43 AM  Pager: [phone] Between 7am to 7pm - call Pager -  [phone]  After 7pm go to [email or web address] - password [email or web address]  Call night coverage person covering after 7pm

## 2017-03-14 NOTE — Progress Notes (Signed)
ANTICOAGULATION CONSULT NOTE - Follow Up Consult  Pharmacy Consult for Heparin Indication: pulmonary embolus  No Known Allergies  Patient Measurements: Height: 6\' 1"  (185.4 cm) Weight: 280 lb (127 kg) IBW/kg (Calculated) : 79.9 Heparin Dosing Weight: 110kg  Vital Signs: Temp: 98.5 F (36.9 C) (06/24 0534) BP: 105/75 (06/24 0534) Pulse Rate: 100 (06/24 0534)  Labs:  Recent Labs  03/12/17 0442  03/13/17 0344 03/13/17 1506 03/14/17 0557  HGB 13.4  --  13.9  --  15.0  HCT 42.3  --  44.6  --  47.0  PLT 203  --  231  --  218  LABPROT 20.6*  --   --   --   --   INR 1.75  --   --   --   --   HEPARINUNFRC  --   < > 0.32 0.43 0.56  CREATININE 1.03  --  1.04  --  1.04  < > = values in this interval not displayed.  Estimated Creatinine Clearance: 101.5 mL/min (by C-G formula based on SCr of 1.04 mg/dL).   Medications:  Heparin @ 2300 units/hr  Assessment: 65yom continues on heparin for new PE while coumadin on hold.  Heparin level remains therapeutic at 0.56 on Heparin drip 2300 units/hr. No bleeding.  Goal of Therapy:  Heparin level 0.3-0.7 units/ml Monitor platelets by anticoagulation protocol: Yes   Plan:  1) Continue heparin at 2300 units/hr 2) Follow up daily heparin leel and CBC  Noah Delaine, RPh Clinical Pharmacist Pager: [phone] today After 4pm call Main pharmacy [phone] 03/14/2017,11:57 AM

## 2017-03-15 LAB — CBC
HCT: 48.1 % (ref 39.0–52.0)
Hemoglobin: 15.7 g/dL (ref 13.0–17.0)
MCH: 28.6 pg (ref 26.0–34.0)
MCHC: 32.6 g/dL (ref 30.0–36.0)
MCV: 87.8 fL (ref 78.0–100.0)
PLATELETS: 224 10*3/uL (ref 150–400)
RBC: 5.48 MIL/uL (ref 4.22–5.81)
RDW: 17.7 % — ABNORMAL HIGH (ref 11.5–15.5)
WBC: 10 10*3/uL (ref 4.0–10.5)

## 2017-03-15 LAB — CULTURE, BLOOD (ROUTINE X 2)
Culture: NO GROWTH
Culture: NO GROWTH
SPECIAL REQUESTS: ADEQUATE
Special Requests: ADEQUATE

## 2017-03-15 LAB — GLUCOSE, CAPILLARY
Glucose-Capillary: 152 mg/dL — ABNORMAL HIGH (ref 65–99)
Glucose-Capillary: 127 mg/dL — ABNORMAL HIGH (ref 65–99)

## 2017-03-15 LAB — HEPARIN LEVEL (UNFRACTIONATED): Heparin Unfractionated: 0.44 IU/mL (ref 0.30–0.70)

## 2017-03-15 MED ORDER — TRAMADOL HCL 50 MG PO TABS: 50.0000 mg | ORAL_TABLET | Freq: Two times a day (BID) | ORAL | 0 refills | Status: DC | PRN

## 2017-03-15 MED ORDER — METOPROLOL TARTRATE 25 MG PO TABS
25.0000 mg | ORAL_TABLET | Freq: Two times a day (BID) | ORAL | Status: DC
  Administered 2017-03-15: 25 mg via ORAL
  Filled 2017-03-15: qty 1

## 2017-03-15 MED ORDER — ALBUTEROL SULFATE HFA 108 (90 BASE) MCG/ACT IN AERS: 2.0000 | INHALATION_SPRAY | Freq: Four times a day (QID) | RESPIRATORY_TRACT | 2 refills | Status: DC | PRN

## 2017-03-15 MED ORDER — METOLAZONE 5 MG PO TABS
5.0000 mg | ORAL_TABLET | Freq: Every day | ORAL | Status: DC
  Administered 2017-03-15: 5 mg via ORAL
  Filled 2017-03-15: qty 1

## 2017-03-15 MED ORDER — RIVAROXABAN 20 MG PO TABS: 20.0000 mg | ORAL_TABLET | Freq: Every day | ORAL | 4 refills | Status: DC

## 2017-03-15 MED ORDER — LEVOFLOXACIN 750 MG PO TABS: 750.0000 mg | ORAL_TABLET | Freq: Every day | ORAL | 0 refills | Status: DC

## 2017-03-15 MED ORDER — RIVAROXABAN (XARELTO) VTE STARTER PACK (15 & 20 MG): ORAL_TABLET | ORAL | 0 refills | Status: DC

## 2017-03-15 MED ORDER — RIVAROXABAN (XARELTO) EDUCATION KIT FOR DVT/PE PATIENTS
PACK | Freq: Once | Status: AC
  Administered 2017-03-15: 13:00:00
  Filled 2017-03-15: qty 1

## 2017-03-15 MED ORDER — METOPROLOL TARTRATE 25 MG PO TABS: 25.0000 mg | ORAL_TABLET | Freq: Two times a day (BID) | ORAL | 3 refills | Status: DC

## 2017-03-15 MED ORDER — METOLAZONE 5 MG PO TABS: 5.0000 mg | ORAL_TABLET | Freq: Every day | ORAL | Status: DC

## 2017-03-15 MED ORDER — RIVAROXABAN 15 MG PO TABS
15.0000 mg | ORAL_TABLET | Freq: Two times a day (BID) | ORAL | Status: DC
  Administered 2017-03-15: 15 mg via ORAL
  Filled 2017-03-15: qty 1

## 2017-03-15 MED ORDER — LEVOFLOXACIN 750 MG PO TABS
750.0000 mg | ORAL_TABLET | Freq: Every day | ORAL | Status: DC
  Administered 2017-03-15: 750 mg via ORAL
  Filled 2017-03-15: qty 1

## 2017-03-15 NOTE — Progress Notes (Signed)
Referring-Pickard, Priscille Heidelberg, MD Reason for referral-Atrial fibrillation  HPI: 66 year old male for evaluation of atrial fibrillation at request Donita Brooks, MD. Patient seen in this office previously but not since 2014. Patient underwent cardioversion in 2014. Echocardiogram June 2018 showed normal LV function, mild aortic stenosis with mean gradient 9 mmHg, trace aortic insufficiency, mild mitral stenosis/mild mitral regurgitation, biatrial enlargement. ABIs June 2018 normal. Patient was recently admitted with respiratory failure/hypoxia. This was felt secondary to COPD, acute on chronic diastolic congestive heart failure and pneumonia. He was treated with antibiotics and diuretics. CTA June 2018 showed small nonocclusive pulmonary embolus. Placed on anticoagulation. Prior to recent admission patient developed productive cough, dyspnea and confusion. His diuretics were increased in the hospital as he apparently was hydrated for his pneumonia and pulmonary embolus. Since discharge his dyspnea is improving. There is no orthopnea, PND and his pedal edema has completely resolved. There is no chest pain, palpitations or syncope. Because of the above we were asked to evaluate.  Current Outpatient Prescriptions  Medication Sig Dispense Refill  . albuterol (PROVENTIL HFA;VENTOLIN HFA) 108 (90 Base) MCG/ACT inhaler Inhale 2 puffs into the lungs every 6 (six) hours as needed for wheezing or shortness of breath. 1 Inhaler 2  . fish oil-omega-3 fatty acids 1000 MG capsule Take 1 g by mouth daily.    . furosemide (LASIX) 40 MG tablet TAKE 3 TABLETS BY MOUTH TWICE A DAY (Patient taking differently: \) 180 tablet 3  . levofloxacin (LEVAQUIN) 750 MG tablet Take 1 tablet (750 mg total) by mouth daily. X 1 week 7 tablet 0  . metFORMIN (GLUCOPHAGE) 1000 MG tablet TAKE ONE TABLET BY MOUTH TWICE DAILY 60 tablet 5  . metolazone (ZAROXOLYN) 5 MG tablet Take 1 tablet (5 mg total) by mouth daily. Take 1 pill 30  minutes before lasix in am (Patient taking differently: Take 5 mg by mouth once a week. ) 30 tablet 0  . metoprolol tartrate (LOPRESSOR) 25 MG tablet Take 1 tablet (25 mg total) by mouth 2 (two) times daily. 60 tablet 3  . potassium chloride SA (KLOR-CON M20) 20 MEQ tablet 2 tabs po bid (Patient taking differently: Take 40 mEq by mouth 2 (two) times daily. 2 tabs po bid) 120 tablet 11  . [START ON 04/05/2017] rivaroxaban (XARELTO) 20 MG TABS tablet Take 1 tablet (20 mg total) by mouth daily with supper. Start only when starter pack is finished on 7/16 30 tablet 4  . Rivaroxaban 15 & 20 MG TBPK Take as directed on package: Start with one 15mg  tablet by mouth twice a day with food. On Day 22 (July16th), switch to one 20mg  tablet once a day with supper. 51 each 0  . traMADol (ULTRAM) 50 MG tablet Take 1 tablet (50 mg total) by mouth every 12 (twelve) hours as needed for moderate pain or severe pain. 20 tablet 0   No current facility-administered medications for this visit.     No Known Allergies   Past Medical History:  Diagnosis Date  . Allergy    allergic rhinitis  . Atrial fibrillation (HCC)   . Colon polyps   . COPD (chronic obstructive pulmonary disease) (HCC)   . Degenerative disc disease, lumbar   . Hypertension   . Pneumonia ~ 2016  . Sleep apnea   . Systolic murmur   . Type II diabetes mellitus (HCC)     Past Surgical History:  Procedure Laterality Date  . CARDIOVERSION N/A 01/09/2013   Procedure: CARDIOVERSION;  Surgeon: Pricilla Riffle, MD;  Location: [facility] ENDOSCOPY;  Service: Cardiovascular;  Laterality: N/A;  . TEE WITHOUT CARDIOVERSION N/A 01/09/2013   Procedure: TRANSESOPHAGEAL ECHOCARDIOGRAM (TEE);  Surgeon: Pricilla Riffle, MD;  Location: [facility] ENDOSCOPY;  Service: Cardiovascular;  Laterality: N/A;    Social History   Social History  . Marital status: Married    Spouse name: N/A  . Number of children: 2  . Years of education: N/A   Occupational History  .      Machinist     Social History Main Topics  . Smoking status: Former Smoker    Packs/day: 1.00    Years: 43.00    Types: Cigarettes    Quit date: 03/08/2017  . Smokeless tobacco: Never Used  . Alcohol use No     Comment: Rare  . Drug use: No  . Sexual activity: Not Currently   Other Topics Concern  . Not on file   Social History Narrative  . No narrative on file    Family History  Problem Relation Age of Onset  . Cancer Mother 28       breast  . Heart disease Father 39       MI  . Cancer Brother        lung  . Stroke Brother 45    ROS: Recent pneumonia but hemoptysis, dysphasia, odynophagia, melena, hematochezia, dysuria, hematuria, rash, seizure activity, orthopnea, PND, pedal edema, claudication. Remaining systems are negative.  Physical Exam:   Blood pressure 110/64, pulse (!) 117, height 6\' 1"  (1.854 m), weight 123.4 kg (272 lb).  General:  Well developed/obese in NAD Skin warm/dry Patient not depressed No peripheral clubbing Back-normal HEENT-normal/normal eyelids Neck supple/normal carotid upstroke bilaterally; no bruits; no JVD; no thyromegaly chest - CTA/ normal expansion CV - irregular and tachycardic/normal S1 and S2; no murmurs, rubs or gallops;  PMI nondisplaced Abdomen -obese, NT/ND, no HSM, no mass, + bowel sounds, no bruit 2+ femoral pulses, no bruits Ext-no edema, chords; chronic skin changes. Diminished distal pulses. Neuro-grossly nonfocal  ECG - Atrial fibrillation with rapid ventricular response at 117. Cannot rule out prior anterior infarct. personally reviewed  A/P  1 Atrial fibrillation-patient is in atrial fibrillation and his rate is elevated. Increase metoprolol to 50 mg twice a day. CHADSvasc - 2; continue xarelto. It is unclear to me how long he has been in atrial fibrillation. He had successful cardioversion in 2014. I have no electrocardiogram until his recent hospital admission. I wonder whether his recent pneumonia caused recurrent atrial  fibrillation. I would like to see if he would hold sinus rhythm. I will arrange cardioversion in 3 weeks. If he does not hold sinus rhythm we will plan rate control and anticoagulation.  2 valvular heart disease with mild aortic stenosis, mild mitral stenosis and mild mitral regurgitation-patient will need follow-up echocardiograms in the future.  3 hypertension- blood pressure is controlled. Continue present medications.  4 morbid obesity-patient counseled on weight loss. I think he needs a sleep study and CPAP if indicated. He would like to consider this.   5 chronic diastolic congestive heart failure-Patient's diuretics were increased markedly during recent admission. He was taking metolazone 5 mg weekly prior to admission and is now on his daily. I will decrease it to once weekly. I will decrease Lasix to 80 mg twice a day. Check potassium and renal function in 1 week. Follow closely for recurrent edema/CHF.   6 small recent pulmonary embolus-continue anticoagulation as prescribed.  7 tobacco  abuse-patient states he has not smoked for 1 week and I encouraged discontinuation.   Olga Millers, MD

## 2017-03-15 NOTE — Progress Notes (Signed)
Benefits check  In process for Eliquis and Xaralto. CM to f/u with results.  Gae Gallop  RN, BSN,CM

## 2017-03-15 NOTE — Evaluation (Signed)
Physical Therapy Evaluation Patient Details Name: William Alvarado MRN: [id number] DOB: May 30, 1951 Today's Date: 03/15/2017   History of Present Illness  65 year old male with chronic atrial fibrillation, COPD, DJD, diabetes, hypertension, ? Diastolic CHF, pedal edema presents for acute CHF exacerbation.  Clinical Impression  Orders received for PT evaluation. Patient demonstrates deficits in functional mobility as indicated below. Will benefit from continued skilled PT to address deficits and maximize function. Will see as indicated and progress as tolerated.   Modest instability noted, ambulated on room air with saturations dropping to 89% and HR elevating to upper 120s with activity. Cues for pursed lip breathing. Improvements noted. Reapplied 2 liters [place] and saturations increased to 95% within 30 seconds    Follow Up Recommendations No PT follow up    Equipment Recommendations  None recommended by PT    Recommendations for Other Services       Precautions / Restrictions Precautions Precautions: Fall Precaution Comments: watch O2 saturations Restrictions Weight Bearing Restrictions: No      Mobility  Bed Mobility               General bed mobility comments: received in chair  Transfers Overall transfer level: Modified independent Equipment used: None             General transfer comment: increased time to elevate to standing, no physical assist required  Ambulation/Gait Ambulation/Gait assistance: Supervision Ambulation Distance (Feet): 140 Feet Assistive device: None Gait Pattern/deviations: Step-through pattern;Decreased stride length;Drifts right/left Gait velocity: decreased Gait velocity interpretation: Below normal speed for age/gender General Gait Details: modest instability noted, ambulated on room air with saturations dropping to 89% and HR elevating to upper 120s with activity. Cues for pursed lip breathing. Improvements noted. Reapplied 2  liters [place] and saturations increased to 95% within 30 seconds  Stairs            Wheelchair Mobility    Modified Rankin (Stroke Patients Only)       Balance Overall balance assessment: No apparent balance deficits (not formally assessed)                                           Pertinent Vitals/Pain      Home Living Family/patient expects to be discharged to:: Private residence Living Arrangements: Spouse/significant other Available Help at Discharge: Family Type of Home: House Home Access: Stairs to enter Entrance Stairs-Rails: Can reach both Entrance Stairs-Number of Steps: 5 Home Layout: One level Home Equipment: Cane - single point      Prior Function Level of Independence: Independent         Comments: ocassional use of cane     Hand Dominance        Extremity/Trunk Assessment   Upper Extremity Assessment Upper Extremity Assessment: Overall WFL for tasks assessed    Lower Extremity Assessment Lower Extremity Assessment:  (noted Bilateral LE edema and dis-coloration)       Communication   Communication: No difficulties  Cognition Arousal/Alertness: Awake/alert Behavior During Therapy: WFL for tasks assessed/performed Overall Cognitive Status: Within Functional Limits for tasks assessed                                        General Comments      Exercises     Assessment/Plan  PT Assessment Patient needs continued PT services  PT Problem List Decreased activity tolerance;Decreased mobility;Cardiopulmonary status limiting activity       PT Treatment Interventions DME instruction;Gait training;Stair training;Therapeutic activities;Functional mobility training;Therapeutic exercise;Balance training;Patient/family education    PT Goals (Current goals can be found in the Care Plan section)  Acute Rehab PT Goals Patient Stated Goal: to go home PT Goal Formulation: With patient Time For Goal  Achievement: 03/29/17 Potential to Achieve Goals: Good    Frequency Min 3X/week   Barriers to discharge        Co-evaluation               AM-PAC PT "6 Clicks" Daily Activity  Outcome Measure Difficulty turning over in bed (including adjusting bedclothes, sheets and blankets)?: A Little Difficulty moving from lying on back to sitting on the side of the bed? : A Little Difficulty sitting down on and standing up from a chair with arms (e.g., wheelchair, bedside commode, etc,.)?: A Little Help needed moving to and from a bed to chair (including a wheelchair)?: A Little Help needed walking in hospital room?: A Little Help needed climbing 3-5 steps with a railing? : A Little 6 Click Score: 18    End of Session Equipment Utilized During Treatment: Oxygen Activity Tolerance: Patient tolerated treatment well Patient left: in chair;with call bell/phone within reach Nurse Communication: Mobility status PT Visit Diagnosis: Unsteadiness on feet (R26.81)    Time: 6812-7517 PT Time Calculation (min) (ACUTE ONLY): 17 min   Charges:   PT Evaluation $PT Eval Moderate Complexity: 1 Procedure     PT G Codes:        Charlotte Crumb, PT DPT  [id number]   Fabio Asa 03/15/2017, 11:07 AM

## 2017-03-15 NOTE — Discharge Summary (Signed)
Physician Discharge Summary   Patient ID: William Alvarado MRN: [id number] DOB/AGE: 1951/07/12 66 y.o.  Admit date: 03/10/2017 Discharge date: 03/15/2017  Primary Care Physician:  William Frizzle, MD  Discharge Diagnoses:       Acute hypoxic respiratory failure   Multifocal pneumonia   Acute pulmonary embolism   COPD exacerbation  Acute on chronic diastolic CHF  Chronic Atrial fibrillation  . Sepsis ([place]) . Hypertension . Systolic murmur . Mitral regurgitation   Diabetes mellitus   Hypokalemia   Consults:  None  Recommendations for Outpatient Follow-up:  1. Home O2 2 L ranged 2. Please repeat CBC/BMET at next visit   DIET: Carb modified,    Allergies:  No Known Allergies   DISCHARGE MEDICATIONS: Current Discharge Medication List    START taking these medications   Details  albuterol (PROVENTIL HFA;VENTOLIN HFA) 108 (90 Base) MCG/ACT inhaler Inhale 2 puffs into the lungs every 6 (six) hours as needed for wheezing or shortness of breath. Qty: 1 Inhaler, Refills: 2    levofloxacin (LEVAQUIN) 750 MG tablet Take 1 tablet (750 mg total) by mouth daily. X 1 week Qty: 7 tablet, Refills: 0    rivaroxaban (XARELTO) 20 MG TABS tablet Take 1 tablet (20 mg total) by mouth daily with supper. Start only when starter pack is finished on 7/16 Qty: 30 tablet, Refills: 4    Rivaroxaban 15 & 20 MG TBPK Take as directed on package: Start with one 89m tablet by mouth twice a day with food. On Day 282(July16th), switch to one 266mtablet once a day with supper. Qty: 51 each, Refills: 0    traMADol (ULTRAM) 50 MG tablet Take 1 tablet (50 mg total) by mouth every 12 (twelve) hours as needed for moderate pain or severe pain. Qty: 20 tablet, Refills: 0      CONTINUE these medications which have CHANGED   Details  metoprolol tartrate (LOPRESSOR) 25 MG tablet Take 1 tablet (25 mg total) by mouth 2 (two) times daily. Qty: 60 tablet, Refills: 3      CONTINUE these medications  which have NOT CHANGED   Details  fish oil-omega-3 fatty acids 1000 MG capsule Take 1 g by mouth daily.    furosemide (LASIX) 40 MG tablet TAKE 3 TABLETS BY MOUTH TWICE A DAY Qty: 180 tablet, Refills: 3   Associated Diagnoses: Acute cor pulmonale (HCC)    metFORMIN (GLUCOPHAGE) 1000 MG tablet TAKE ONE TABLET BY MOUTH TWICE DAILY Qty: 60 tablet, Refills: 5    metolazone (ZAROXOLYN) 5 MG tablet Take 1 tablet (5 mg total) by mouth daily. Take 1 pill 30 minutes before lasix in am Qty: 30 tablet, Refills: 0    potassium chloride SA (KLOR-CON M20) 20 MEQ tablet 2 tabs po bid Qty: 120 tablet, Refills: 11      STOP taking these medications     warfarin (COUMADIN) 5 MG tablet      naproxen sodium (ANAPROX) 220 MG tablet      warfarin (COUMADIN) 5 MG tablet          Brief H and P: For complete details please refer to admission H and P, but in brief Patient is a [age]ear old male with chronic atrial fibrillation, COPD, DJD, diabetes, hypertension, ? Diastolic CHF, pedal edema on Lasix brought to ED after he became confused at the physician's office around 10 AM in the morning, having acute onset of shortness of breath, hypoxic with O2 sat in low 80s. Reported fevers, chills or  night sweats, chronic lower activity swelling and had not taken his diuretic. WBC count 18.4, lactic acid 2.89. Chest x-ray showed cardiomegaly with mild pulmonary vascular congestion, right base infiltrate suspicious for pneumonia. Patient was admitted and eventually placed on BiPAP in the stepdown unit.   Hospital Course:    Acute respiratory failure with hypoxia (HCC) secondary to COPD, acute on chronic diastolic CHF,Multifocal pneumonia -On admission WBC count elevated at 18, chest x-ray with vascular condition and right base infiltrate. Initially placed on Venti mask, then BiPAP - Currently Improving, home O2 evaluation done, needs 2 L O2 on ambulation - CTA chest showed multifocal pneumonia, placed on IV  vancomycin and cefepime on 6/22, transitioned to oral Levaquin for a week  - The patient will need a repeat chest x-ray or a CT chest in 2-3 weeks to ensure complete resolution of multifocal pneumonia   Acute pulmonary embolism - Due to persistent hypoxia, CTA chest was done. CT angiogram of the chest showed small nonocclusive pulmonary embolism in the segmental and subsegmental pulmonary arteries to the lingula (patient is on warfarin prior to admission hence a warfarin failure) - Patient was placed on IV heparin. Discussed in detail with patient and his wife about warfarin failure and likely need to be on NOAC's, discussed about risks and benefits. Patient and his wife agreeable with NOAC's - After receiving copays from the case management, patient was started on xarelto.    Acute on chronic diastolic CHF - Prior 2-D echo in 09/2015 had shown EF of 60-65%, however chest x-ray with vascular congestion, on high-dose Lasix at home with retaining fluid -Improving now with negative balance of 4.7 L, weight down from 298 on 6/21 -> 281 lbs at the time of discharge - continue Lasix 80 mg twice a day, received metolazone 6/22 - 2-D echo showed EF of 55% - Patient is in a high-dose Lasix and metolazone and has not been following with cardiology outpatient. I requested cardiology to follow outpatient, appointment scheduled on 03/18/17 with Dr. Stanford Breed.   Sepsis due to the pneumonia - Patient met sepsis criteria at the time of admission given tachycardia, tachypnea, fever, hypotension, leukocytosis. Tmax in ED 103.67F - Sepsis physiology improved  Atrial fibrillation, chronic  - Rate controlled, continue metoprolol - Warfarin discontinued as patient had pulmonary embolism with therapeutic INR - Continue xarelto, [place] vasc score 4    Hypertension - BP currently improving, placed on low-dose metoprolol, continue Lasix and metolazone  Diabetes mellitus  - Continue metformin, hemoglobin A1c  7.2 - Outpatient follow-up with PCP  Hypokalemia - Continue scheduled potassium replacement  Day of Discharge BP 119/84 (BP Location: Left Arm)   Pulse 87   Temp 97.8 F (36.6 C) (Oral)   Resp 18   Ht '6\' 1"'  (1.854 m)   Wt 127.6 kg (281 lb 4.9 oz)   SpO2 94%   BMI 37.11 kg/m   Physical Exam: General: Alert and awake oriented x3 not in any acute distress. HEENT: anicteric sclera, pupils reactive to light and accommodation CVS: S1-S2 clear no murmur rubs or gallops Chest: Decreased breath sounds at the bases, much improved from admission  Abdomen: soft nontender, nondistended, normal bowel sounds Extremities: no cyanosis, clubbing, 1+ edema noted bilaterally Neuro: Cranial nerves II-XII intact, no focal neurological deficits   The results of significant diagnostics from this hospitalization (including imaging, microbiology, ancillary and laboratory) are listed below for reference.    LAB RESULTS: Basic Metabolic Panel:  Recent Labs Lab 03/13/17 0344 03/14/17  0557  NA 133* 135  K 3.1* 3.3*  CL 86* 85*  CO2 36* 37*  GLUCOSE 108* 113*  BUN 14 20  CREATININE 1.04 1.04  CALCIUM 9.1 9.5  MG 1.8  --    Liver Function Tests:  Recent Labs Lab 03/10/17 1322 03/11/17 0346  AST 27 13*  ALT 15* 11*  ALKPHOS 67 58  BILITOT 1.5* 1.1  PROT 8.1 7.1  ALBUMIN 3.8 3.1*   No results for input(s): LIPASE, AMYLASE in the last 168 hours. No results for input(s): AMMONIA in the last 168 hours. CBC:  Recent Labs Lab 03/10/17 1554  03/14/17 0557 03/15/17 0521  WBC 18.4*  < > 9.4 10.0  NEUTROABS 15.7*  --   --   --   HGB 14.7  < > 15.0 15.7  HCT 45.5  < > 47.0 48.1  MCV 88.9  < > 88.7 87.8  PLT 211  < > 218 224  < > = values in this interval not displayed. Cardiac Enzymes: No results for input(s): CKTOTAL, CKMB, CKMBINDEX, TROPONINI in the last 168 hours. BNP: Invalid input(s): POCBNP CBG:  Recent Labs Lab 03/15/17 0741 03/15/17 1228  GLUCAP 152* 127*     Significant Diagnostic Studies:  Dg Chest Port 1 View  Result Date: 03/11/2017 CLINICAL DATA:  Cough and fever EXAM: PORTABLE CHEST 1 VIEW COMPARISON:  March 10, 2017 FINDINGS: Patchy infiltrate remains in the right base. There is slight left base atelectasis. Lungs elsewhere clear. Cardiomegaly with pulmonary venous hypertension persists. No adenopathy. No bone lesions. IMPRESSION: Persistent pulmonary vascular congestion. Patchy right base infiltrate, suspicious for pneumonia. Mild left base atelectasis. Electronically Signed   By: Lowella Grip III M.D.   On: 03/11/2017 08:29   Dg Chest Port 1 View  Result Date: 03/10/2017 CLINICAL DATA:  Stroke.  History of atrial fibrillation and COPD. EXAM: PORTABLE CHEST 1 VIEW COMPARISON:  No recent prior. FINDINGS: Cardiomegaly with mild pulmonary vascular prominence. Right base infiltrate. Right base pneumonia cannot be excluded. No pleural effusion or pneumothorax. No acute bony abnormality. IMPRESSION: 1.  Cardiomegaly with mild pulmonary vascular congestion. 2. Right base infiltrate.  Right base pneumonia cannot be excluded . Electronically Signed   By: Marcello Moores  Register   On: 03/10/2017 14:00   Ct Head Code Stroke W/o Cm  Result Date: 03/10/2017 CLINICAL DATA:  Code stroke.  Confusion.  Expressive aphasia EXAM: CT HEAD WITHOUT CONTRAST TECHNIQUE: Contiguous axial images were obtained from the base of the skull through the vertex without intravenous contrast. COMPARISON:  None. FINDINGS: Brain: No evidence of acute infarction, hemorrhage, hydrocephalus, extra-axial collection or mass lesion/mass effect. Vascular: No hyperdense vessel or unexpected calcification. Skull: Negative Sinuses/Orbits: Negative Other: None ASPECTS ([place] Stroke Program Early CT Score) - Ganglionic level infarction (caudate, lentiform nuclei, internal capsule, insula, M1-M3 cortex): 7 - Supraganglionic infarction (M4-M6 cortex): 3 Total score (0-10 with 10 being normal): 10  IMPRESSION: 1. Negative CT head 2. ASPECTS is 10 These results were called by telephone at the time of interpretation on 03/10/2017 at 1:36 pm to Dr. Leonel Ramsay, who verbally acknowledged these results. Electronically Signed   By: Franchot Gallo M.D.   On: 03/10/2017 13:36    2D ECHO: Study Conclusions  - Left ventricle: Wall thickness was increased in a pattern of mild   LVH. The estimated ejection fraction was 55%. The study is not   technically sufficient to allow evaluation of LV diastolic   function. - Aortic valve: There was mild stenosis. There  was trivial   regurgitation. Valve area (VTI): 2.05 cm^2. Valve area (Vmax):   2.06 cm^2. Valve area (Vmean): 1.91 cm^2. - Mitral valve: Severely calcified annulus. Moderately thickened,   mildly calcified leaflets . The findings are consistent with mild   stenosis. There was mild regurgitation. - Left atrium: The atrium was severely dilated. - Right atrium: The atrium was moderately dilated. - Atrial septum: No defect or patent foramen ovale was identified. - Pulmonary arteries: PA peak pressure: 44 mm Hg (S).  Disposition and Follow-up: Discharge Instructions    (HEART FAILURE PATIENTS) Call MD:  Anytime you have any of the following symptoms: 1) 3 pound weight gain in 24 hours or 5 pounds in 1 week 2) shortness of breath, with or without a dry hacking cough 3) swelling in the hands, feet or stomach 4) if you have to sleep on extra pillows at night in order to breathe.    Complete by:  As directed    Diet - low sodium heart healthy    Complete by:  As directed    Diet Carb Modified    Complete by:  As directed    Discharge instructions    Complete by:  As directed    Please continue xarelto as directed on the starter pack. 15 mg twice a day with food for 3 weeks. On day #22 (July 16), switched to 20 mg once a day with supper.  Please avoid aspirin, daily Aleve, Motrin or ibuprofen while on xarelto   Increase activity slowly     Complete by:  As directed        DISPOSITION:  Home    DISCHARGE FOLLOW-UP Follow-up Information    Lelon Perla, MD Follow up on 03/18/2017.   Specialty:  Cardiology Why:  at 3:30pm for your follow up appt.  Contact information: [address] [place] [address] [phone]        William Frizzle, MD. Schedule an appointment as soon as possible for a visit in 2 week(s).   Specialty:  Family Medicine Contact information: [address] [place] [address] [phone]            Time spent on Discharge: 45 mins   Signed:   Estill Cotta M.D. [facility] 03/15/2017, 12:49 PM Pager: [phone]

## 2017-03-15 NOTE — Progress Notes (Signed)
SATURATION QUALIFICATIONS: (This note is used to comply with regulatory documentation for home oxygen)  Patient Saturations on Room Air at Rest = 88%  Patient Saturations on Room Air while Ambulating = 88%  Patient Saturations on 2 Liters of oxygen while Ambulating = 96%  Please briefly explain why patient needs home oxygen: decreased O2 saturations.

## 2017-03-15 NOTE — Progress Notes (Signed)
Benefits check for Eliquis and Xaralto: Theo Dills, RN        Both medications have a co-pay of $104.04 for a 30 day supply until deductible/ out of pocket is met.   There is co-pay cards available for both meds   Patient can use any pharmacy   Previous Messages    ----- Message -----  From: Cecille Rubin, RN  Sent: 03/15/2017  8:41 AM  To: [facility] Case Mgr Assistant  Subject: Bbenefits check                  Please check :  1.Eliquis 46m twice a day for 21 days then 57mtwice day  2. Xaralto 1529mwice a day for 21 days then 42m44meryday. Thanks     AngeWhitman HeroBSN,CM

## 2017-03-15 NOTE — Care Management Note (Signed)
Case Management Note  Patient Details  Name: William Alvarado MRN: [id number] Date of Birth: 08-12-1951  Subjective/Objective:         Admitted with acute respiratory failure with hypoxia, hx of chronic atrial fibrillation, COPD, DJD, diabetes, hypertension, ? Diastolic CHF, pedal edema.  From home with wife.    Sidharth Sharum (Spouse) Ginger Carne (Daughter)     [phone]     PCP: Lynnea Ferrier  Action/Plan: Plan is to d/c to home today with oxygen. Portable tank will be delivered to bedside prior to d/c. Wife will rovide transportation to home.    Expected Discharge Date:  03/15/17               Expected Discharge Plan:  Home/Self Care  In-House Referral:     Discharge planning Services  CM Consult  Post Acute Care Choice:    Choice offered to:  Patient  DME Arranged:  Oxygen DME Agency:  [facility] referral made with [facility] Arranged:    [facility] Agency:     Status of Service:  Completed, signed off  If discussed at Long Length of Stay Meetings, dates discussed:    Additional Comments:  Epifanio Lesches, RN 03/15/2017, 12:36 PM

## 2017-03-17 ENCOUNTER — Telehealth: Payer: Self-pay | Admitting: Family Medicine

## 2017-03-17 NOTE — Telephone Encounter (Signed)
Spoke to pt's wife on 03/16/17 for post hospital f/u and she states that pt is doing well with no questions. She was wondering what the results were with his ultrasound that was done on his legs. Pt's wife informed of results and pt has an appt to see Dr. Tanya Nones on 03/29/17 for Hospital f/u.

## 2017-03-18 ENCOUNTER — Ambulatory Visit (INDEPENDENT_AMBULATORY_CARE_PROVIDER_SITE_OTHER): Payer: BLUE CROSS/BLUE SHIELD | Admitting: Cardiology

## 2017-03-18 ENCOUNTER — Encounter: Payer: Self-pay | Admitting: *Deleted

## 2017-03-18 ENCOUNTER — Encounter: Payer: Self-pay | Admitting: Cardiology

## 2017-03-18 VITALS — BP 110/64 | HR 117 | Ht 73.0 in | Wt 272.0 lb

## 2017-03-18 DIAGNOSIS — I10 Essential (primary) hypertension: Secondary | ICD-10-CM

## 2017-03-18 DIAGNOSIS — I5032 Chronic diastolic (congestive) heart failure: Secondary | ICD-10-CM | POA: Diagnosis not present

## 2017-03-18 DIAGNOSIS — I481 Persistent atrial fibrillation: Secondary | ICD-10-CM | POA: Diagnosis not present

## 2017-03-18 DIAGNOSIS — I2609 Other pulmonary embolism with acute cor pulmonale: Secondary | ICD-10-CM

## 2017-03-18 DIAGNOSIS — I4819 Other persistent atrial fibrillation: Secondary | ICD-10-CM

## 2017-03-18 MED ORDER — METOLAZONE 5 MG PO TABS: ORAL_TABLET | ORAL | 0 refills | Status: DC

## 2017-03-18 MED ORDER — METOPROLOL TARTRATE 50 MG PO TABS: 50.0000 mg | ORAL_TABLET | Freq: Two times a day (BID) | ORAL | 3 refills | Status: DC

## 2017-03-18 MED ORDER — FUROSEMIDE 40 MG PO TABS: 80.0000 mg | ORAL_TABLET | Freq: Two times a day (BID) | ORAL | 3 refills | Status: DC

## 2017-03-18 NOTE — Addendum Note (Signed)
Addended by: Freddi Starr on: 03/18/2017 05:14 PM   Modules accepted: Orders, SmartSet

## 2017-03-18 NOTE — Patient Instructions (Addendum)
Medication Instructions:   INCREASE METOPROLOL TO 50 MG TWICE DAILY= 2 OF THE 25 MG TABLETS TWICE DAILY  TAKE METOLAZONE Monday MORNING ONLY  DECREASE FUROSEMIDE TO 80 MG TWICE DAILY= 2 OF THE 40 MG TABLETS TWICE DAILY  Labwork:  Your physician recommends that you return for lab work in: ONE WEEK  Testing/Procedures:  Your physician has recommended that you have a Cardioversion (DCCV). Electrical Cardioversion uses a jolt of electricity to your heart either through paddles or wired patches attached to your chest. This is a controlled, usually prescheduled, procedure. Defibrillation is done under light anesthesia in the hospital, and you usually go home the day of the procedure. This is done to get your heart back into a normal rhythm. You are not awake for the procedure. Please see the instruction sheet given to you today.    Follow-Up:  Your physician recommends that you schedule a follow-up appointment in: ONE WEEK WITH APP  Your physician recommends that you schedule a follow-up appointment in: 8-12 WEEKS AFTER DCCV WITH DR Jens Som

## 2017-03-22 NOTE — Addendum Note (Signed)
Addended by: Beryle Quant on: 03/22/2017 10:58 AM   Modules accepted: Orders

## 2017-03-26 ENCOUNTER — Ambulatory Visit (INDEPENDENT_AMBULATORY_CARE_PROVIDER_SITE_OTHER): Payer: BLUE CROSS/BLUE SHIELD | Admitting: Cardiology

## 2017-03-26 ENCOUNTER — Encounter: Payer: Self-pay | Admitting: Cardiology

## 2017-03-26 VITALS — BP 102/62 | HR 99 | Ht 73.0 in | Wt 274.6 lb

## 2017-03-26 DIAGNOSIS — Z7901 Long term (current) use of anticoagulants: Secondary | ICD-10-CM | POA: Diagnosis not present

## 2017-03-26 DIAGNOSIS — I481 Persistent atrial fibrillation: Secondary | ICD-10-CM

## 2017-03-26 DIAGNOSIS — I10 Essential (primary) hypertension: Secondary | ICD-10-CM | POA: Diagnosis not present

## 2017-03-26 DIAGNOSIS — J181 Lobar pneumonia, unspecified organism: Secondary | ICD-10-CM

## 2017-03-26 DIAGNOSIS — J9601 Acute respiratory failure with hypoxia: Secondary | ICD-10-CM | POA: Diagnosis not present

## 2017-03-26 DIAGNOSIS — E119 Type 2 diabetes mellitus without complications: Secondary | ICD-10-CM

## 2017-03-26 DIAGNOSIS — Z86711 Personal history of pulmonary embolism: Secondary | ICD-10-CM

## 2017-03-26 DIAGNOSIS — E669 Obesity, unspecified: Secondary | ICD-10-CM | POA: Diagnosis not present

## 2017-03-26 DIAGNOSIS — J189 Pneumonia, unspecified organism: Secondary | ICD-10-CM

## 2017-03-26 DIAGNOSIS — I4891 Unspecified atrial fibrillation: Secondary | ICD-10-CM | POA: Diagnosis not present

## 2017-03-26 DIAGNOSIS — I4819 Other persistent atrial fibrillation: Secondary | ICD-10-CM

## 2017-03-26 DIAGNOSIS — Z01812 Encounter for preprocedural laboratory examination: Secondary | ICD-10-CM

## 2017-03-26 HISTORY — DX: Long term (current) use of anticoagulants: Z79.01

## 2017-03-26 HISTORY — DX: Personal history of pulmonary embolism: Z86.711

## 2017-03-26 NOTE — Assessment & Plan Note (Signed)
Pt seen today in follow up from his office visit 03/18/17- rate is under better control on increased Lopressor

## 2017-03-26 NOTE — Patient Instructions (Addendum)
Medication Instructions:  Your physician recommends that you continue on your current medications as directed. Please refer to the Current Medication list given to you today.  *Do not miss any doses of Xarelto*  Labwork: Return for lab work week of July 16th - any day, no appt needed.  M-F 8-4:30 closed for lunch 12:45-1:45.   (CBC, BMET)  Testing/Procedures: Cardioversion is scheduled for 7/24  Follow-Up: After cardioversion with Dr. Jens Som  Any Other Special Instructions Will Be Listed Below (If Applicable).     If you need a refill on your cardiac medications before your next appointment, please call your pharmacy.

## 2017-03-26 NOTE — Assessment & Plan Note (Signed)
Currently on BID Xarelto Rx

## 2017-03-26 NOTE — Assessment & Plan Note (Signed)
Controlled.  

## 2017-03-26 NOTE — Progress Notes (Signed)
03/26/2017 William Alvarado   1951-03-14  [id number]  Primary Physician Tanya Nones Priscille Heidelberg, MD Primary Cardiologist: Dr Jens Som  HPI:  66 year old obese male who had been seen in this office previously but not since 2014. Patient underwent cardioversion in 2014. Patient was recently admitted 03/10/17 with respiratory failure/hypoxia. He was sent from his PCP's office lethargic and hypoxic. This was felt secondary to COPD, acute on chronic diastolic congestive heart failure and pneumonia. He was treated with antibiotics and diuretics. Because of continued hypoxia he had a CTA 03/12/17 showed a small nonocclusive pulmonary embolus despite therapeutic INR. He was subsequently placed on Xarelto anticoagulation at treatment dose. During that admission he was noted to be in atrial fibrilation. He saw Dr Antoine Poche 03/18/17 and Lopressor was increased. The pt is scheduled for OP DCCV 04/13/17. I'm seeing him today to make sure his rate is under control.    Pt remains on O2. His rate is 98. He is tolerating this well. He almost assuredly has sleep apnea but seem reluctant to pursue this. His wife says he snores, and the pt frequently falls asleep during the day and doesn't sleep well at night. We discuss pursing a sleep study after he recovers from his recent illness and his cardioversion.    Current Outpatient Prescriptions  Medication Sig Dispense Refill  . albuterol (PROVENTIL HFA;VENTOLIN HFA) 108 (90 Base) MCG/ACT inhaler Inhale 2 puffs into the lungs every 6 (six) hours as needed for wheezing or shortness of breath. 1 Inhaler 2  . fish oil-omega-3 fatty acids 1000 MG capsule Take 1 g by mouth daily.    . furosemide (LASIX) 40 MG tablet Take 2 tablets (80 mg total) by mouth 2 (two) times daily. 180 tablet 3  . metFORMIN (GLUCOPHAGE) 1000 MG tablet TAKE ONE TABLET BY MOUTH TWICE DAILY 60 tablet 5  . metolazone (ZAROXOLYN) 5 MG tablet Take 1 pill 30 minutes before lasix on Monday only 30 tablet 0  .  metoprolol tartrate (LOPRESSOR) 50 MG tablet Take 1 tablet (50 mg total) by mouth 2 (two) times daily. 60 tablet 3  . potassium chloride SA (K-DUR,KLOR-CON) 20 MEQ tablet Take 40 mEq by mouth 2 (two) times daily.    . Rivaroxaban 15 & 20 MG TBPK Take as directed on package: Start with one 15mg  tablet by mouth twice a day with food. On Day 22 (July16th), switch to one 20mg  tablet once a day with supper. 51 each 0  . [START ON 04/05/2017] rivaroxaban (XARELTO) 20 MG TABS tablet Take 1 tablet (20 mg total) by mouth daily with supper. Start only when starter pack is finished on 7/16 (Patient not taking: Reported on 03/26/2017) 30 tablet 4   No current facility-administered medications for this visit.     No Known Allergies  Past Medical History:  Diagnosis Date  . Allergy    allergic rhinitis  . Atrial fibrillation (HCC)   . Colon polyps   . COPD (chronic obstructive pulmonary disease) (HCC)   . Degenerative disc disease, lumbar   . Hypertension   . Pneumonia ~ 2016  . Sleep apnea   . Systolic murmur   . Type II diabetes mellitus (HCC)     Social History   Social History  . Marital status: Married    Spouse name: N/A  . Number of children: 2  . Years of education: N/A   Occupational History  .      Machinist   Social History Main Topics  . Smoking  status: Former Smoker    Packs/day: 1.00    Years: 43.00    Types: Cigarettes    Quit date: 03/08/2017  . Smokeless tobacco: Never Used  . Alcohol use No     Comment: Rare  . Drug use: No  . Sexual activity: Not Currently   Other Topics Concern  . Not on file   Social History Narrative  . No narrative on file     Family History  Problem Relation Age of Onset  . Cancer Mother 73       breast  . Heart disease Father 18       MI  . Cancer Brother        lung  . Stroke Brother 45     Review of Systems: General: negative for chills, fever, night sweats or weight changes.  Cardiovascular: negative for chest pain,  dyspnea on exertion, edema, orthopnea, palpitations, paroxysmal nocturnal dyspnea or shortness of breath Dermatological: negative for rash Respiratory: negative for cough or wheezing Urologic: negative for hematuria Abdominal: negative for nausea, vomiting, diarrhea, bright red blood per rectum, melena, or hematemesis Neurologic: negative for visual changes, syncope, or dizziness All other systems reviewed and are otherwise negative except as noted above.    Blood pressure 102/62, pulse 99, height 6\' 1"  (1.854 m), weight 274 lb 9.6 oz (124.6 kg), SpO2 98 %.  General appearance: alert, cooperative, no distress and morbidly obese Neck: no carotid bruit and no JVD Lungs: scattered rhonchi Heart: irregularly irregular rhythm Abdomen: obese, non tender Extremities: venous stasis dermatitis noted and no edema Pulses: 2+ and symmetric Skin: chronic venous skin changes LE Neurologic: Grossly normal  EKG AF with VR 98, PVCs  ASSESSMENT AND PLAN:   Atrial fibrillation with rapid ventricular response (HCC) Pt seen today in follow up from his office visit 03/18/17- rate is under better control on increased Lopressor  Acute respiratory failure with hypoxia (HCC) Hospitalized 03/11/17 with acute respiratory failure secondary to CAP, diastolic CHF, and pulmonary embolism  Obesity (BMI 30-39.9) Suspected sleep apnea  Community acquired pneumonia of right lower lobe of lung (HCC) Recent hospitalization for CAP  Diabetes mellitus without complication (HCC) Type 2 NIDDM  Hypertension Controlled  Chronic anticoagulation Failed Coumadin (PE with therapeutic INR). Now on Xarelto   PLAN  Proceed with OP DCCV as scheduled 7/24. He'll need to be set up for a sleep study in the future.   Corine Shelter PA-C 03/26/2017 3:16 PM

## 2017-03-26 NOTE — Assessment & Plan Note (Signed)
Suspected sleep apnea

## 2017-03-26 NOTE — Assessment & Plan Note (Signed)
Hospitalized 03/11/17 with acute respiratory failure secondary to CAP, diastolic CHF, and pulmonary embolism

## 2017-03-26 NOTE — Assessment & Plan Note (Signed)
Failed Coumadin (PE with therapeutic INR). Now on Xarelto

## 2017-03-26 NOTE — Assessment & Plan Note (Signed)
Recent hospitalization for CAP

## 2017-03-26 NOTE — Assessment & Plan Note (Signed)
Type 2 NIDDM 

## 2017-03-29 ENCOUNTER — Ambulatory Visit: Payer: BLUE CROSS/BLUE SHIELD | Admitting: Family Medicine

## 2017-03-29 ENCOUNTER — Encounter: Payer: Self-pay | Admitting: Family Medicine

## 2017-03-29 ENCOUNTER — Ambulatory Visit (INDEPENDENT_AMBULATORY_CARE_PROVIDER_SITE_OTHER): Payer: BLUE CROSS/BLUE SHIELD | Admitting: Family Medicine

## 2017-03-29 VITALS — BP 126/80 | HR 78 | Temp 98.3°F | Resp 22 | Ht 73.0 in | Wt 279.0 lb

## 2017-03-29 DIAGNOSIS — Z86711 Personal history of pulmonary embolism: Secondary | ICD-10-CM

## 2017-03-29 DIAGNOSIS — Z09 Encounter for follow-up examination after completed treatment for conditions other than malignant neoplasm: Secondary | ICD-10-CM | POA: Diagnosis not present

## 2017-03-29 DIAGNOSIS — I5033 Acute on chronic diastolic (congestive) heart failure: Secondary | ICD-10-CM

## 2017-03-29 DIAGNOSIS — I482 Chronic atrial fibrillation, unspecified: Secondary | ICD-10-CM

## 2017-03-29 DIAGNOSIS — J159 Unspecified bacterial pneumonia: Secondary | ICD-10-CM | POA: Diagnosis not present

## 2017-03-29 DIAGNOSIS — R601 Generalized edema: Secondary | ICD-10-CM

## 2017-03-29 LAB — COMPLETE METABOLIC PANEL WITH GFR
ALBUMIN: 4 g/dL (ref 3.6–5.1)
ALT: 15 U/L (ref 9–46)
AST: 18 U/L (ref 10–35)
Alkaline Phosphatase: 67 U/L (ref 40–115)
BILIRUBIN TOTAL: 0.6 mg/dL (ref 0.2–1.2)
BUN: 30 mg/dL — AB (ref 7–25)
CO2: 28 mmol/L (ref 20–31)
Calcium: 9.3 mg/dL (ref 8.6–10.3)
Chloride: 92 mmol/L — ABNORMAL LOW (ref 98–110)
Creat: 1.35 mg/dL — ABNORMAL HIGH (ref 0.70–1.25)
GFR, EST NON AFRICAN AMERICAN: 55 mL/min — AB (ref >=60)
GFR, Est African American: 64 mL/min (ref >=60)
GLUCOSE: 100 mg/dL — AB (ref 70–99)
POTASSIUM: 4.2 mmol/L (ref 3.5–5.3)
SODIUM: 135 mmol/L (ref 135–146)
TOTAL PROTEIN: 7.7 g/dL (ref 6.1–8.1)

## 2017-03-29 LAB — CBC WITH DIFFERENTIAL/PLATELET
BASOS ABS: 94 {cells}/uL (ref 0–200)
Basophils Relative: 1 %
EOS PCT: 4 %
Eosinophils Absolute: 376 cells/uL (ref 15–500)
HCT: 45.5 % (ref 38.5–50.0)
HEMOGLOBIN: 14.7 g/dL (ref 13.0–17.0)
LYMPHS ABS: 1598 {cells}/uL (ref 850–3900)
Lymphocytes Relative: 17 %
MCH: 28.8 pg (ref 27.0–33.0)
MCHC: 32.3 g/dL (ref 32.0–36.0)
MCV: 89 fL (ref 80.0–100.0)
MONOS PCT: 12 %
MPV: 10.2 fL (ref 7.5–12.5)
Monocytes Absolute: 1128 cells/uL — ABNORMAL HIGH (ref 200–950)
NEUTROS ABS: 6204 {cells}/uL (ref 1500–7800)
Neutrophils Relative %: 66 %
PLATELETS: 266 10*3/uL (ref 140–400)
RBC: 5.11 MIL/uL (ref 4.20–5.80)
RDW: 17.3 % — ABNORMAL HIGH (ref 11.0–15.0)
WBC: 9.4 10*3/uL (ref 3.8–10.8)

## 2017-03-29 NOTE — Progress Notes (Signed)
Subjective:    Patient ID: William Alvarado, male    DOB: 1951-06-16, 66 y.o.   MRN: [id number]  HPI Patient was recently admitted to the hospital with community-acquired pneumonia, altered mental status secondary to hypoxia, pulmonary embolism, and acute on chronic diastolic heart failure with fluid overload. I have copied relevant portions of the discharge summary and included them below for my reference:  Admit date: 03/10/2017 Discharge date: 03/15/2017  Primary Care Physician:  [name], MD  Discharge Diagnoses:       Acute hypoxic respiratory failure   Multifocal pneumonia   Acute pulmonary embolism   COPD exacerbation  Acute on chronic diastolic CHF  Chronic Atrial fibrillation  . Sepsis ([place]) . Hypertension . Systolic murmur . Mitral regurgitation   Diabetes mellitus   Hypokalemia  Brief H and P: For complete details please refer to admission H and P, but in brief Patient is a 66 year old male with chronic atrial fibrillation, COPD, DJD, diabetes, hypertension, ? Diastolic CHF, pedal edema on Lasix brought to ED after he became confused at the physician's office around 10 AM in the morning, having acute onset of shortness of breath, hypoxic with O2 sat in low 80s. Reported fevers, chills or night sweats, chronic lower activity swelling and had not taken his diuretic. WBC count 18.4, lactic acid 2.89. Chest x-ray showed cardiomegaly with mild pulmonary vascular congestion, right base infiltrate suspicious for pneumonia. Patient was admitted and eventually placed on BiPAP in the stepdown unit.   Hospital Course:   Acute respiratory failure with hypoxia (HCC) secondary to COPD, acute on chronic diastolic CHF,Multifocal pneumonia -On admission WBC count elevated at 18, chest x-ray with vascular condition and right base infiltrate. Initially placed on Venti mask, then BiPAP - Currently Improving, home O2 evaluation done, needs 2 L O2 on ambulation - CTA chest  showed multifocal pneumonia, placed on IV vancomycin and cefepime on 6/22, transitioned to oral Levaquin for a week  - The patient will need a repeat chest x-ray or a CT chest in 2-3 weeks to ensure complete resolution of multifocal pneumonia   Acute pulmonary embolism - Due to persistent hypoxia, CTA chest was done. CT angiogram of the chest showed small nonocclusive pulmonary embolism in the segmental and subsegmental pulmonary arteries to the lingula (patient is on warfarin prior to admission hence a warfarin failure) - Patient was placed on IV heparin. Discussed in detail with patient and his wife about warfarin failure and likely need to be on NOAC's, discussed about risks and benefits. Patient and his wife agreeable with NOAC's - After receiving copays from the case management, patient was started on xarelto.    Acute on chronic diastolic CHF - Prior 2-D echo in 09/2015 had shown EF of 60-65%, however chest x-ray with vascular congestion, on high-dose Lasix at home with retaining fluid -Improving now with negative balance of 4.7 L, weight down from 298 on 6/21 ->281 lbs at the time of discharge - continue Lasix 80 mg twice a day, received metolazone 6/22 - 2-D echo showed EF of 55% - Patient is in a high-dose Lasix and metolazone and has not been following with cardiology outpatient. I requested cardiology to follow outpatient, appointment scheduled on 03/18/17 with Dr. Stanford Breed.   Sepsis due to the pneumonia - Patient met sepsis criteria at the time of admission given tachycardia, tachypnea, fever, hypotension, leukocytosis. Tmax in ED 103.82F - Sepsis physiology improved  Atrial fibrillation, chronic  - Rate controlled, continue metoprolol - Warfarin  discontinued as patient had pulmonary embolism with therapeutic INR - Continue xarelto, [place] vasc score 4    Hypertension - BP currently improving, placed on low-dose metoprolol, continue Lasix and metolazone  Diabetes mellitus    - Continue metformin, hemoglobin A1c 7.2 - Outpatient follow-up with PCP  Hypokalemia - Continue scheduled potassium replacement   He is here today for follow-up. Overall he states that his breathing is better. He would like to discontinue oxygen. He has chronic venous stasis changes in both legs but there is no pitting edema. There is no weeping ulcer as it is essentially healed on his anterior right shin. There are no pulmonary crackles on exam. He does not appear to be fluid overloaded weight according to our scales is 297 lbs.  Antibiotics have helped the shortness of breath. He denies any fevers or chills. He has quit smoking which I congratulated him on. On his exam, this is the best his lungs have ever sounded. There are no expiratory wheezes. He is in nature fibrillation this morning but he is rate controlled. He is not wearing his compression hose however he is being compliant with his diuretic therapy. He was diagnosed with a pulmonary embolism despite being on an adequate dose of Coumadin. Therefore he was switched to xarelto 15 mg pobid for 3 weeks then 20 mg poqday thereafter.  He is being compliant with this. He denies any bleeding or bruising.  Past Medical History:  Diagnosis Date  . Allergy    allergic rhinitis  . Atrial fibrillation ([place])   . Colon polyps   . COPD (chronic obstructive pulmonary disease) ([place])   . Degenerative disc disease, lumbar   . Hypertension   . Pneumonia ~ 2016  . Sleep apnea   . Systolic murmur   . Type II diabetes mellitus ([place])    Past Surgical History:  Procedure Laterality Date  . CARDIOVERSION N/A 01/09/2013   Procedure: CARDIOVERSION;  Surgeon: Fay Records, MD;  Location: [facility] ENDOSCOPY;  Service: Cardiovascular;  Laterality: N/A;  . TEE WITHOUT CARDIOVERSION N/A 01/09/2013   Procedure: TRANSESOPHAGEAL ECHOCARDIOGRAM (TEE);  Surgeon: Fay Records, MD;  [facility] ENDOSCOPY;  Service: Cardiovascular;  Laterality: N/A;   Current Outpatient  Prescriptions on File Prior to Visit  Medication Sig Dispense Refill  . albuterol (PROVENTIL HFA;VENTOLIN HFA) 108 (90 Base) MCG/ACT inhaler Inhale 2 puffs into the lungs every 6 (six) hours as needed for wheezing or shortness of breath. 1 Inhaler 2  . fish oil-omega-3 fatty acids 1000 MG capsule Take 1 g by mouth daily.    . furosemide (LASIX) 40 MG tablet Take 2 tablets (80 mg total) by mouth 2 (two) times daily. 180 tablet 3  . metFORMIN (GLUCOPHAGE) 1000 MG tablet TAKE ONE TABLET BY MOUTH TWICE DAILY 60 tablet 5  . metolazone (ZAROXOLYN) 5 MG tablet Take 1 pill 30 minutes before lasix on Monday only 30 tablet 0  . metoprolol tartrate (LOPRESSOR) 50 MG tablet Take 1 tablet (50 mg total) by mouth 2 (two) times daily. 60 tablet 3  . potassium chloride SA (K-DUR,KLOR-CON) 20 MEQ tablet Take 40 mEq by mouth 2 (two) times daily.    . Rivaroxaban 15 & 20 MG TBPK Take as directed on package: Start with one 49m tablet by mouth twice a day with food. On Day 218(July16th), switch to one 212mtablet once a day with supper. 51 each 0  . [START ON 04/05/2017] rivaroxaban (XARELTO) 20 MG TABS tablet Take 1 tablet (20  mg total) by mouth daily with supper. Start only when starter pack is finished on 7/16 (Patient not taking: Reported on 03/26/2017) 30 tablet 4   No current facility-administered medications on file prior to visit.    No Known Allergies Social History   Social History  . Marital status: Married    Spouse name: N/A  . Number of children: 2  . Years of education: N/A   Occupational History  .      Machinist   Social History Main Topics  . Smoking status: Former Smoker    Packs/day: 1.00    Years: 43.00    Types: Cigarettes    Quit date: 03/08/2017  . Smokeless tobacco: Never Used  . Alcohol use No     Comment: Rare  . Drug use: No  . Sexual activity: Not Currently   Other Topics Concern  . Not on file   Social History Narrative  . No narrative on file      Review of  Systems  All other systems reviewed and are negative.      Objective:   Physical Exam  Constitutional: He appears well-developed and well-nourished. No distress.  Neck: Neck supple. No JVD present.  Cardiovascular: Normal rate.   Pulmonary/Chest: Effort normal. He has no wheezes. He has no rales.  Abdominal: Soft. Bowel sounds are normal.  Musculoskeletal: He exhibits no edema.  Skin: Rash noted. He is not diaphoretic. There is erythema.          Assessment & Plan:  Hospital discharge follow-up - Plan: CBC with Differential/Platelet, COMPLETE METABOLIC PANEL WITH GFR  Chronic atrial fibrillation (HCC)  Acute on chronic diastolic heart failure (HCC)  Generalized edema  History of pulmonary embolism  Community acquired bacterial pneumonia    Regarding his pneumonia, he needs to have a chest xray scheduled for 3 weeks. I will get this arranged. Clinically however he has no evidence of pneumonia on his exam today. Regarding his pulmonary embolism, he is on appropriate anticoagulant. He will be switching to 20 mg a day saying. I will check a CBC to rule out any acute anemia or evidence of bleeding. Regarding his COPD, he is quit smoking which is the mainstay of therapy. His exam today is unremarkable. This is the best his lungs sounded in years. I will continue him on oxygen for the next month and then if he is no longer hypoxic I will discontinue it at that point. Regarding his chronic diastolic heart failure, I believe is due to a combination of obesity, COPD, smoking. I also believe that he likely has obstructive sleep apnea. I have mentioned this the patient on numerous occasions. He is now finally willing to allow me to schedule him for a sleep study to evaluate further to that we can adequately treat him  if present.

## 2017-04-06 ENCOUNTER — Other Ambulatory Visit: Payer: Self-pay | Admitting: Family Medicine

## 2017-04-07 ENCOUNTER — Ambulatory Visit
Admission: RE | Admit: 2017-04-07 | Discharge: 2017-04-07 | Disposition: A | Discharge status: Home / Self Care | Payer: BLUE CROSS/BLUE SHIELD | Source: Ambulatory Visit | Attending: Family Medicine | Admitting: Family Medicine

## 2017-04-07 DIAGNOSIS — J159 Unspecified bacterial pneumonia: Secondary | ICD-10-CM

## 2017-04-12 ENCOUNTER — Telehealth: Payer: Self-pay | Admitting: Cardiology

## 2017-04-12 ENCOUNTER — Telehealth: Payer: Self-pay | Admitting: *Deleted

## 2017-04-12 DIAGNOSIS — Z01812 Encounter for preprocedural laboratory examination: Secondary | ICD-10-CM

## 2017-04-12 DIAGNOSIS — I4819 Other persistent atrial fibrillation: Secondary | ICD-10-CM

## 2017-04-12 LAB — CBC
Hematocrit: 41.2 % (ref 37.5–51.0)
Hemoglobin: 14 g/dL (ref 13.0–17.7)
MCH: 29 pg (ref 26.6–33.0)
MCHC: 34 g/dL (ref 31.5–35.7)
MCV: 86 fL (ref 79–97)
Platelets: 246 10*3/uL (ref 150–379)
RBC: 4.82 x10E6/uL (ref 4.14–5.80)
RDW: 16.6 % — ABNORMAL HIGH (ref 12.3–15.4)
WBC: 8.4 10*3/uL (ref 3.4–10.8)

## 2017-04-12 LAB — BASIC METABOLIC PANEL
BUN/Creatinine Ratio: 17 (ref 10–24)
BUN: 23 mg/dL (ref 8–27)
CO2: 28 mmol/L (ref 20–29)
Calcium: 9.7 mg/dL (ref 8.6–10.2)
Chloride: 93 mmol/L — ABNORMAL LOW (ref 96–106)
Creatinine, Ser: 1.35 mg/dL — ABNORMAL HIGH (ref 0.76–1.27)
GFR calc Af Amer: 64 mL/min/{1.73_m2} (ref >59)
GFR calc non Af Amer: 55 mL/min/{1.73_m2} — ABNORMAL LOW (ref >59)
Glucose: 98 mg/dL (ref 65–99)
Potassium: 4.6 mmol/L (ref 3.5–5.2)
Sodium: 137 mmol/L (ref 134–144)

## 2017-04-12 LAB — PROTIME-INR
INR: 1.1 (ref 0.8–1.2)
Prothrombin Time: 11.5 s (ref 9.1–12.0)

## 2017-04-12 NOTE — Telephone Encounter (Signed)
Received call from wife-states patients PCP informed her that he needs an order for lab work.    Faxed lab orders to Dr. Felisa Bonier office-called and verified with office that lab orders were received.   Informed that they cannot take orders from other offices, patient will need to come to office to have drawn.     Wife aware, correct orders placed.

## 2017-04-12 NOTE — Telephone Encounter (Signed)
Returned call to wife (ok per DPR)-advised that per protocol labs must be within 14 days (verified with DOD).  Patient will have labs repeated at PCP today in prep for DCCV tomorrow.     Verbalized understanding.

## 2017-04-12 NOTE — Telephone Encounter (Signed)
New message     Pt had lab work done last week at [facility] , does he need to have more before his cardio version?

## 2017-04-13 ENCOUNTER — Encounter (HOSPITAL_COMMUNITY): Payer: Self-pay | Admitting: *Deleted

## 2017-04-13 ENCOUNTER — Encounter (HOSPITAL_COMMUNITY)
Admission: RE | Disposition: A | Discharge status: Home / Self Care | Payer: Self-pay | Source: Ambulatory Visit | Attending: Cardiology

## 2017-04-13 ENCOUNTER — Ambulatory Visit (HOSPITAL_COMMUNITY): Payer: BLUE CROSS/BLUE SHIELD | Admitting: Anesthesiology

## 2017-04-13 ENCOUNTER — Ambulatory Visit (HOSPITAL_COMMUNITY)
Admission: RE | Admit: 2017-04-13 | Discharge: 2017-04-13 | Disposition: A | Discharge status: Home / Self Care | Payer: BLUE CROSS/BLUE SHIELD | Source: Ambulatory Visit | Attending: Cardiology | Admitting: Cardiology

## 2017-04-13 DIAGNOSIS — Z87891 Personal history of nicotine dependence: Secondary | ICD-10-CM | POA: Insufficient documentation

## 2017-04-13 DIAGNOSIS — I4891 Unspecified atrial fibrillation: Secondary | ICD-10-CM | POA: Insufficient documentation

## 2017-04-13 DIAGNOSIS — I2699 Other pulmonary embolism without acute cor pulmonale: Secondary | ICD-10-CM | POA: Insufficient documentation

## 2017-04-13 DIAGNOSIS — Z7901 Long term (current) use of anticoagulants: Secondary | ICD-10-CM | POA: Diagnosis not present

## 2017-04-13 DIAGNOSIS — I481 Persistent atrial fibrillation: Secondary | ICD-10-CM | POA: Diagnosis not present

## 2017-04-13 DIAGNOSIS — I110 Hypertensive heart disease with heart failure: Secondary | ICD-10-CM | POA: Diagnosis not present

## 2017-04-13 DIAGNOSIS — I454 Nonspecific intraventricular block: Secondary | ICD-10-CM | POA: Diagnosis not present

## 2017-04-13 DIAGNOSIS — I4819 Other persistent atrial fibrillation: Secondary | ICD-10-CM

## 2017-04-13 DIAGNOSIS — E119 Type 2 diabetes mellitus without complications: Secondary | ICD-10-CM | POA: Insufficient documentation

## 2017-04-13 DIAGNOSIS — Z86010 Personal history of colonic polyps: Secondary | ICD-10-CM | POA: Diagnosis not present

## 2017-04-13 DIAGNOSIS — I491 Atrial premature depolarization: Secondary | ICD-10-CM | POA: Diagnosis not present

## 2017-04-13 DIAGNOSIS — Z6837 Body mass index (BMI) 37.0-37.9, adult: Secondary | ICD-10-CM | POA: Diagnosis not present

## 2017-04-13 DIAGNOSIS — I5033 Acute on chronic diastolic (congestive) heart failure: Secondary | ICD-10-CM | POA: Insufficient documentation

## 2017-04-13 DIAGNOSIS — J449 Chronic obstructive pulmonary disease, unspecified: Secondary | ICD-10-CM | POA: Insufficient documentation

## 2017-04-13 DIAGNOSIS — I080 Rheumatic disorders of both mitral and aortic valves: Secondary | ICD-10-CM | POA: Insufficient documentation

## 2017-04-13 DIAGNOSIS — M5136 Other intervertebral disc degeneration, lumbar region: Secondary | ICD-10-CM | POA: Diagnosis not present

## 2017-04-13 DIAGNOSIS — G4730 Sleep apnea, unspecified: Secondary | ICD-10-CM | POA: Diagnosis not present

## 2017-04-13 DIAGNOSIS — M1990 Unspecified osteoarthritis, unspecified site: Secondary | ICD-10-CM | POA: Insufficient documentation

## 2017-04-13 HISTORY — PX: CARDIOVERSION: SHX1299

## 2017-04-13 LAB — GLUCOSE, CAPILLARY: Glucose-Capillary: 108 mg/dL — ABNORMAL HIGH (ref 65–99)

## 2017-04-13 SURGERY — CARDIOVERSION
Anesthesia: Monitor Anesthesia Care

## 2017-04-13 MED ORDER — MEPERIDINE HCL 100 MG/ML IJ SOLN: 6.2500 mg | INTRAMUSCULAR | Status: DC | PRN

## 2017-04-13 MED ORDER — PROPOFOL 10 MG/ML IV BOLUS
INTRAVENOUS | Status: DC | PRN
  Administered 2017-04-13: 30 mg via INTRAVENOUS
  Administered 2017-04-13: 70 mg via INTRAVENOUS
  Administered 2017-04-13: 50 mg via INTRAVENOUS

## 2017-04-13 MED ORDER — SODIUM CHLORIDE 0.9% FLUSH: 3.0000 mL | Freq: Two times a day (BID) | INTRAVENOUS | Status: DC

## 2017-04-13 MED ORDER — SODIUM CHLORIDE 0.9 % IV SOLN
250.0000 mL | INTRAVENOUS | Status: DC
  Administered 2017-04-13: 250 mL via INTRAVENOUS

## 2017-04-13 MED ORDER — FENTANYL CITRATE (PF) 100 MCG/2ML IJ SOLN: 25.0000 ug | INTRAMUSCULAR | Status: DC | PRN

## 2017-04-13 MED ORDER — SODIUM CHLORIDE 0.9% FLUSH: 3.0000 mL | INTRAVENOUS | Status: DC | PRN

## 2017-04-13 MED ORDER — ONDANSETRON HCL 4 MG/2ML IJ SOLN: 4.0000 mg | Freq: Once | INTRAMUSCULAR | Status: DC | PRN

## 2017-04-13 NOTE — CV Procedure (Signed)
   Electrical Cardioversion Procedure Note [name] [id number] August 22, 1951  Procedure: Electrical Cardioversion Indications:  Atrial Fibrillation  Time Out: Verified patient identification, verified procedure,medications/allergies/relevent history reviewed, required imaging and test results available.  Performed  Procedure Details  The patient was NPO after midnight. Anesthesia was administered at the beside  by Dr. Mercie Eon with 150mg  of propofol.  Cardioversion was done with synchronized biphasic defibrillation with AP pads with 150watts.  The patient converted to normal sinus rhythm. The patient tolerated the procedure well   IMPRESSION:  Successful cardioversion of atrial fibrillation    William Alvarado 04/13/2017, 10:45 AM

## 2017-04-13 NOTE — Transfer of Care (Signed)
Immediate Anesthesia Transfer of Care Note  Patient: William Alvarado  Procedure(s) Performed: Procedure(s): CARDIOVERSION (N/A)  Patient Location: PACU and Endoscopy Unit  Anesthesia Type:MAC  Level of Consciousness: awake, patient cooperative and responds to stimulation  Airway & Oxygen Therapy: Patient Spontanous Breathing  Post-op Assessment: Report given to RN and Post -op Vital signs reviewed and stable  Post vital signs: Reviewed and stable  Last Vitals:  Vitals:   04/13/17 1058  BP: 104/78  Pulse: 93  Resp: 18  Temp: 36.8 C    Last Pain:  Vitals:   04/13/17 1058  TempSrc: Oral         Complications: No apparent anesthesia complications

## 2017-04-13 NOTE — Anesthesia Postprocedure Evaluation (Signed)
Anesthesia Post Note  Patient: SHAMOND SKELTON  Procedure(s) Performed: Procedure(s) (LRB): CARDIOVERSION (N/A)     Patient location during evaluation: PACU Anesthesia Type: MAC Level of consciousness: awake and alert Pain management: pain level controlled Vital Signs Assessment: post-procedure vital signs reviewed and stable Respiratory status: spontaneous breathing, nonlabored ventilation, respiratory function stable and patient connected to nasal cannula oxygen Cardiovascular status: blood pressure returned to baseline and stable Postop Assessment: no signs of nausea or vomiting Anesthetic complications: no    Last Vitals:  Vitals:   04/13/17 1058 04/13/17 1210  BP: 104/78 100/65  Pulse: 93 60  Resp: 18 (!) 21  Temp: 36.8 C 37 C    Last Pain:  Vitals:   04/13/17 1210  TempSrc: Oral                 Salimata Christenson

## 2017-04-13 NOTE — Interval H&P Note (Signed)
History and Physical Interval Note:  04/13/2017 10:45 AM  William Alvarado  has presented today for surgery, with the diagnosis of afib  The various methods of treatment have been discussed with the patient and family. After consideration of risks, benefits and other options for treatment, the patient has consented to  Procedure(s): CARDIOVERSION (N/A) as a surgical intervention .  The patient's history has been reviewed, patient examined, no change in status, stable for surgery.  I have reviewed the patient's chart and labs.  Questions were answered to the patient's satisfaction.     [name]

## 2017-04-13 NOTE — H&P (View-Only) (Signed)
  Referring-Pickard, Warren T, MD Reason for referral-Atrial fibrillation  HPI: 65-year-old male for evaluation of atrial fibrillation at request Warren T Pickard, MD. Patient seen in this office previously but not since 2014. Patient underwent cardioversion in 2014. Echocardiogram June 2018 showed normal LV function, mild aortic stenosis with mean gradient 9 mmHg, trace aortic insufficiency, mild mitral stenosis/mild mitral regurgitation, biatrial enlargement. ABIs June 2018 normal. Patient was recently admitted with respiratory failure/hypoxia. This was felt secondary to COPD, acute on chronic diastolic congestive heart failure and pneumonia. He was treated with antibiotics and diuretics. CTA June 2018 showed small nonocclusive pulmonary embolus. Placed on anticoagulation. Prior to recent admission patient developed productive cough, dyspnea and confusion. His diuretics were increased in the hospital as he apparently was hydrated for his pneumonia and pulmonary embolus. Since discharge his dyspnea is improving. There is no orthopnea, PND and his pedal edema has completely resolved. There is no chest pain, palpitations or syncope. Because of the above we were asked to evaluate.  Current Outpatient Prescriptions  Medication Sig Dispense Refill  . albuterol (PROVENTIL HFA;VENTOLIN HFA) 108 (90 Base) MCG/ACT inhaler Inhale 2 puffs into the lungs every 6 (six) hours as needed for wheezing or shortness of breath. 1 Inhaler 2  . fish oil-omega-3 fatty acids 1000 MG capsule Take 1 g by mouth daily.    . furosemide (LASIX) 40 MG tablet TAKE 3 TABLETS BY MOUTH TWICE A DAY (Patient taking differently: \) 180 tablet 3  . levofloxacin (LEVAQUIN) 750 MG tablet Take 1 tablet (750 mg total) by mouth daily. X 1 week 7 tablet 0  . metFORMIN (GLUCOPHAGE) 1000 MG tablet TAKE ONE TABLET BY MOUTH TWICE DAILY 60 tablet 5  . metolazone (ZAROXOLYN) 5 MG tablet Take 1 tablet (5 mg total) by mouth daily. Take 1 pill 30  minutes before lasix in am (Patient taking differently: Take 5 mg by mouth once a week. ) 30 tablet 0  . metoprolol tartrate (LOPRESSOR) 25 MG tablet Take 1 tablet (25 mg total) by mouth 2 (two) times daily. 60 tablet 3  . potassium chloride SA (KLOR-CON M20) 20 MEQ tablet 2 tabs po bid (Patient taking differently: Take 40 mEq by mouth 2 (two) times daily. 2 tabs po bid) 120 tablet 11  . [START ON 04/05/2017] rivaroxaban (XARELTO) 20 MG TABS tablet Take 1 tablet (20 mg total) by mouth daily with supper. Start only when starter pack is finished on 7/16 30 tablet 4  . Rivaroxaban 15 & 20 MG TBPK Take as directed on package: Start with one 15mg tablet by mouth twice a day with food. On Day 22 (July16th), switch to one 20mg tablet once a day with supper. 51 each 0  . traMADol (ULTRAM) 50 MG tablet Take 1 tablet (50 mg total) by mouth every 12 (twelve) hours as needed for moderate pain or severe pain. 20 tablet 0   No current facility-administered medications for this visit.     No Known Allergies   Past Medical History:  Diagnosis Date  . Allergy    allergic rhinitis  . Atrial fibrillation (HCC)   . Colon polyps   . COPD (chronic obstructive pulmonary disease) (HCC)   . Degenerative disc disease, lumbar   . Hypertension   . Pneumonia ~ 2016  . Sleep apnea   . Systolic murmur   . Type II diabetes mellitus (HCC)     Past Surgical History:  Procedure Laterality Date  . CARDIOVERSION N/A 01/09/2013   Procedure: CARDIOVERSION;    Surgeon: Paula V Ross, MD;  Location: [facility] ENDOSCOPY;  Service: Cardiovascular;  Laterality: N/A;  . TEE WITHOUT CARDIOVERSION N/A 01/09/2013   Procedure: TRANSESOPHAGEAL ECHOCARDIOGRAM (TEE);  Surgeon: Paula V Ross, MD;  Location: [facility] ENDOSCOPY;  Service: Cardiovascular;  Laterality: N/A;    Social History   Social History  . Marital status: Married    Spouse name: N/A  . Number of children: 2  . Years of education: N/A   Occupational History  .      Machinist     Social History Main Topics  . Smoking status: Former Smoker    Packs/day: 1.00    Years: 43.00    Types: Cigarettes    Quit date: 03/08/2017  . Smokeless tobacco: Never Used  . Alcohol use No     Comment: Rare  . Drug use: No  . Sexual activity: Not Currently   Other Topics Concern  . Not on file   Social History Narrative  . No narrative on file    Family History  Problem Relation Age of Onset  . Cancer Mother 50       breast  . Heart disease Father 50       MI  . Cancer Brother        lung  . Stroke Brother 45    ROS: Recent pneumonia but hemoptysis, dysphasia, odynophagia, melena, hematochezia, dysuria, hematuria, rash, seizure activity, orthopnea, PND, pedal edema, claudication. Remaining systems are negative.  Physical Exam:   Blood pressure 110/64, pulse (!) 117, height 6' 1" (1.854 m), weight 123.4 kg (272 lb).  General:  Well developed/obese in NAD Skin warm/dry Patient not depressed No peripheral clubbing Back-normal HEENT-normal/normal eyelids Neck supple/normal carotid upstroke bilaterally; no bruits; no JVD; no thyromegaly chest - CTA/ normal expansion CV - irregular and tachycardic/normal S1 and S2; no murmurs, rubs or gallops;  PMI nondisplaced Abdomen -obese, NT/ND, no HSM, no mass, + bowel sounds, no bruit 2+ femoral pulses, no bruits Ext-no edema, chords; chronic skin changes. Diminished distal pulses. Neuro-grossly nonfocal  ECG - Atrial fibrillation with rapid ventricular response at 117. Cannot rule out prior anterior infarct. personally reviewed  A/P  1 Atrial fibrillation-patient is in atrial fibrillation and his rate is elevated. Increase metoprolol to 50 mg twice a day. CHADSvasc - 2; continue xarelto. It is unclear to me how long he has been in atrial fibrillation. He had successful cardioversion in 2014. I have no electrocardiogram until his recent hospital admission. I wonder whether his recent pneumonia caused recurrent atrial  fibrillation. I would like to see if he would hold sinus rhythm. I will arrange cardioversion in 3 weeks. If he does not hold sinus rhythm we will plan rate control and anticoagulation.  2 valvular heart disease with mild aortic stenosis, mild mitral stenosis and mild mitral regurgitation-patient will need follow-up echocardiograms in the future.  3 hypertension- blood pressure is controlled. Continue present medications.  4 morbid obesity-patient counseled on weight loss. I think he needs a sleep study and CPAP if indicated. He would like to consider this.   5 chronic diastolic congestive heart failure-Patient's diuretics were increased markedly during recent admission. He was taking metolazone 5 mg weekly prior to admission and is now on his daily. I will decrease it to once weekly. I will decrease Lasix to 80 mg twice a day. Check potassium and renal function in 1 week. Follow closely for recurrent edema/CHF.   6 small recent pulmonary embolus-continue anticoagulation as prescribed.  7 tobacco   abuse-patient states he has not smoked for 1 week and I encouraged discontinuation.   Brian Crenshaw, MD  

## 2017-04-13 NOTE — Anesthesia Preprocedure Evaluation (Addendum)
Anesthesia Evaluation  Patient identified by MRN, date of birth, ID band Patient unresponsive  General Assessment Comment:Pt already sedated and unresponsive upon my arrival to endoscopy suite.  Reviewed: Allergy & Precautions, H&P , NPO status , Patient's Chart, lab work & pertinent test results  History of Anesthesia Complications (+) PONV  Airway Mallampati: III   Neck ROM: limited    Dental   Pulmonary former smoker,    breath sounds clear to auscultation       Cardiovascular hypertension, + dysrhythmias Atrial Fibrillation + Valvular Problems/Murmurs MR and AS  Rhythm:Regular Rate:Normal     Neuro/Psych    GI/Hepatic   Endo/Other  diabetes, Type 2Morbid obesity  Renal/GU      Musculoskeletal  (+) Arthritis ,   Abdominal   Peds  Hematology   Anesthesia Other Findings ECHO 6/18  - Left ventricle: Wall thickness was increased in a pattern of mild   LVH. The estimated ejection fraction was 55%. The study is not   technically sufficient to allow evaluation of LV diastolic   function. - Aortic valve: There was mild stenosis. There was trivial   regurgitation. Valve area (VTI): 2.05 cm^2. Valve area (Vmax):   2.06 cm^2. Valve area (Vmean): 1.91 cm^2. - Mitral valve: Severely calcified annulus. Moderately thickened,   mildly calcified leaflets . The findings are consistent with mild   stenosis. There was mild regurgitation. - Left atrium: The atrium was severely dilated. - Right atrium: The atrium was moderately dilated. - Atrial septum: No defect or patent foramen ovale was identified. - Pulmonary arteries: PA peak pressure: 44 mm Hg (S).  Reproductive/Obstetrics                          Anesthesia Physical  Anesthesia Plan  ASA: III  Anesthesia Plan: MAC   Post-op Pain Management:    Induction: Intravenous  PONV Risk Score and Plan:   Airway Management Planned: Mask  Additional  Equipment:   Intra-op Plan:   Post-operative Plan:   Informed Consent: I have reviewed the patients History and Physical, chart, labs and discussed the procedure including the risks, benefits and alternatives for the proposed anesthesia with the patient or authorized representative who has indicated his/her understanding and acceptance.     Plan Discussed with: CRNA, Anesthesiologist and Surgeon  Anesthesia Plan Comments:         Anesthesia Quick Evaluation

## 2017-04-13 NOTE — Discharge Instructions (Signed)
Electrical Cardioversion, Care After °This sheet gives you information about how to care for yourself after your procedure. Your health care provider may also give you more specific instructions. If you have problems or questions, contact your health care provider. °What can I expect after the procedure? °After the procedure, it is common to have: °· Some redness on the skin where the shocks were given. ° °Follow these instructions at home: °· Do not drive for 24 hours if you were given a medicine to help you relax (sedative). °· Take over-the-counter and prescription medicines only as told by your health care provider. °· Ask your health care provider how to check your pulse. Check it often. °· Rest for 48 hours after the procedure or as told by your health care provider. °· Avoid or limit your caffeine use as told by your health care provider. °Contact a health care provider if: °· You feel like your heart is beating too quickly or your pulse is not regular. °· You have a serious muscle cramp that does not go away. °Get help right away if: °· You have discomfort in your chest. °· You are dizzy or you feel faint. °· You have trouble breathing or you are short of breath. °· Your speech is slurred. °· You have trouble moving an arm or leg on one side of your body. °· Your fingers or toes turn cold or blue. °This information is not intended to replace advice given to you by your health care provider. Make sure you discuss any questions you have with your health care provider. °Document Released: 06/28/2013 Document Revised: 04/10/2016 Document Reviewed: 03/13/2016 °Elsevier Interactive Patient Education © 2018 [employer] ° °

## 2017-04-15 ENCOUNTER — Encounter (HOSPITAL_COMMUNITY): Payer: Self-pay | Admitting: Cardiology

## 2017-04-22 ENCOUNTER — Other Ambulatory Visit: Payer: Self-pay | Admitting: Family Medicine

## 2017-04-22 MED ORDER — METOPROLOL TARTRATE 50 MG PO TABS: 50.0000 mg | ORAL_TABLET | Freq: Two times a day (BID) | ORAL | 11 refills | Status: DC

## 2017-04-23 ENCOUNTER — Ambulatory Visit (INDEPENDENT_AMBULATORY_CARE_PROVIDER_SITE_OTHER): Payer: BLUE CROSS/BLUE SHIELD | Admitting: Cardiology

## 2017-04-23 ENCOUNTER — Encounter: Payer: Self-pay | Admitting: Cardiology

## 2017-04-23 VITALS — BP 120/66 | HR 66 | Ht 73.0 in | Wt 279.8 lb

## 2017-04-23 DIAGNOSIS — I480 Paroxysmal atrial fibrillation: Secondary | ICD-10-CM

## 2017-04-23 DIAGNOSIS — E119 Type 2 diabetes mellitus without complications: Secondary | ICD-10-CM

## 2017-04-23 DIAGNOSIS — Z7901 Long term (current) use of anticoagulants: Secondary | ICD-10-CM

## 2017-04-23 DIAGNOSIS — E669 Obesity, unspecified: Secondary | ICD-10-CM | POA: Diagnosis not present

## 2017-04-23 DIAGNOSIS — I10 Essential (primary) hypertension: Secondary | ICD-10-CM | POA: Diagnosis not present

## 2017-04-23 DIAGNOSIS — I4891 Unspecified atrial fibrillation: Secondary | ICD-10-CM

## 2017-04-23 NOTE — Progress Notes (Signed)
04/23/2017 William Alvarado   06/14/1951  [id number]  Primary Physician Tanya Nones Priscille Heidelberg, MD Primary Cardiologist: Dr Jens Som  HPI:  66 year old obese male who has a history of PAF. Patient underwent cardioversion in 2014. He has been on Coumadin. We had not seen him till he was admitted in June 2018. Patient was admitted 03/10/17 with respiratory failure/hypoxia. This was felt secondary to COPD, acute on chronic diastolic congestive heart failure and pneumonia. He was treated with antibiotics and diuretics. Because of continued hypoxia he had a CTA 03/12/17 showed a small nonocclusive pulmonary embolus despite having a therapeutic INR. He was subsequently placed on Xarelto anticoagulation at treatment dose. During that admission he was noted to be in atrial fibrilation. He saw Dr Antoine Poche 03/18/17 and his Lopressor was increased. The pt was seen in follow up 03/26/17 and set up for OP DCCV which was done 04/13/17. He is in the office today for follow up. He has been doing well. He feels better now that he is in NSR. He remains on O2. He has a sleep study scheduled for the next two weeks.   Current Outpatient Prescriptions  Medication Sig Dispense Refill  . albuterol (PROVENTIL HFA;VENTOLIN HFA) 108 (90 Base) MCG/ACT inhaler Inhale 2 puffs into the lungs every 6 (six) hours as needed for wheezing or shortness of breath. 1 Inhaler 2  . fish oil-omega-3 fatty acids 1000 MG capsule Take 1 g by mouth daily.    . furosemide (LASIX) 40 MG tablet Take 2 tablets (80 mg total) by mouth 2 (two) times daily. 180 tablet 3  . metFORMIN (GLUCOPHAGE) 1000 MG tablet TAKE ONE TABLET BY MOUTH TWICE DAILY 60 tablet 5  . metolazone (ZAROXOLYN) 5 MG tablet Take 1 pill 30 minutes before lasix on Monday only 30 tablet 0  . metoprolol tartrate (LOPRESSOR) 50 MG tablet Take 1 tablet (50 mg total) by mouth 2 (two) times daily. 60 tablet 11  . potassium chloride SA (K-DUR,KLOR-CON) 20 MEQ tablet Take 40 mEq by mouth 2 (two)  times daily.    . rivaroxaban (XARELTO) 20 MG TABS tablet Take 1 tablet (20 mg total) by mouth daily with supper. Start only when starter pack is finished on 7/16 30 tablet 4   No current facility-administered medications for this visit.     No Known Allergies  Past Medical History:  Diagnosis Date  . Allergy    allergic rhinitis  . Atrial fibrillation (HCC)   . Colon polyps   . COPD (chronic obstructive pulmonary disease) (HCC)   . Degenerative disc disease, lumbar   . Hypertension   . Pneumonia ~ 2016  . Sleep apnea   . Systolic murmur   . Type II diabetes mellitus (HCC)     Social History   Social History  . Marital status: Married    Spouse name: N/A  . Number of children: 2  . Years of education: N/A   Occupational History  .      Machinist   Social History Main Topics  . Smoking status: Former Smoker    Packs/day: 1.00    Years: 43.00    Types: Cigarettes    Quit date: 03/08/2017  . Smokeless tobacco: Never Used  . Alcohol use No     Comment: Rare  . Drug use: No  . Sexual activity: Not Currently   Other Topics Concern  . Not on file   Social History Narrative  . No narrative on file  Family History  Problem Relation Age of Onset  . Cancer Mother 46       breast  . Heart disease Father 32       MI  . Cancer Brother        lung  . Stroke Brother 45     Review of Systems: General: negative for chills, fever, night sweats or weight changes.  Cardiovascular: negative for chest pain, dyspnea on exertion, edema, orthopnea, palpitations, paroxysmal nocturnal dyspnea or shortness of breath Dermatological: negative for rash Respiratory: negative for cough or wheezing Urologic: negative for hematuria Abdominal: negative for nausea, vomiting, diarrhea, bright red blood per rectum, melena, or hematemesis Neurologic: negative for visual changes, syncope, or dizziness All other systems reviewed and are otherwise negative except as noted  above.    Blood pressure 120/66, pulse 66, height 6\' 1"  (1.854 m), weight 279 lb 12.8 oz (126.9 kg).  General appearance: alert, cooperative, no distress and moderately obese Lungs: clear to auscultation bilaterally Heart: regular rate and rhythm Neurologic: Grossly normal  EKG [id number]-66  ASSESSMENT AND PLAN:   Atrial fibrillation with rapid ventricular response (HCC) DCCV in 2014 with recurrent PAF in setting of respiratory failure June 2018. S/P DCCV 04/13/17 to NSR  Acute respiratory failure with hypoxia [facility]) Hospitalized 03/11/17 with acute respiratory failure secondary to CAP, diastolic CHF, and pulmonary embolism  Obesity (BMI 30-39.9) Suspected sleep apnea-sleep study scheduled  Diabetes mellitus without complication (HCC) Type 2 NIDDM  Hypertension Controlled  Chronic anticoagulation Failed Coumadin (PE with therapeutic INR). Now on Xarelto  PLAN  I encouraged him to follow through with sleep study. If he has sleep apnea and its treated it may lower his risk of recurrent PAF. F/U with Dr Jens Som in 3 months.   Corine Shelter PA-C 04/23/2017 11:10 AM

## 2017-04-23 NOTE — Patient Instructions (Signed)
Medication Instructions:  Your physician recommends that you continue on your current medications as directed. Please refer to the Current Medication list given to you today. If you need a refill on your cardiac medications before your next appointment, please call your pharmacy.  Follow-Up: Your physician wants you to follow-up in: 3 MONTHS WITH DR Jens Som.   Thank you for choosing [facility] at [facility]!!

## 2017-04-23 NOTE — Assessment & Plan Note (Signed)
DCCV in 2014 with recurrent PAF in setting of respiratory failure June 2018. S/P DCCV 04/13/17 to NSR

## 2017-04-28 ENCOUNTER — Other Ambulatory Visit: Payer: BLUE CROSS/BLUE SHIELD

## 2017-04-29 ENCOUNTER — Encounter: Payer: Self-pay | Admitting: Family Medicine

## 2017-04-29 ENCOUNTER — Other Ambulatory Visit: Payer: BLUE CROSS/BLUE SHIELD

## 2017-04-29 ENCOUNTER — Ambulatory Visit (INDEPENDENT_AMBULATORY_CARE_PROVIDER_SITE_OTHER): Payer: BLUE CROSS/BLUE SHIELD | Admitting: Family Medicine

## 2017-04-29 VITALS — BP 100/64 | HR 62 | Temp 98.8°F | Resp 18 | Ht 73.0 in | Wt 284.0 lb

## 2017-04-29 DIAGNOSIS — Z09 Encounter for follow-up examination after completed treatment for conditions other than malignant neoplasm: Secondary | ICD-10-CM | POA: Diagnosis not present

## 2017-04-29 DIAGNOSIS — J159 Unspecified bacterial pneumonia: Secondary | ICD-10-CM

## 2017-04-29 NOTE — Progress Notes (Signed)
Subjective:    Patient ID: William Alvarado, male    DOB: 12/25/1950, 66 y.o.   MRN: [id number]  HPI  03/2017 Patient was recently admitted to the hospital with community-acquired pneumonia, altered mental status secondary to hypoxia, pulmonary embolism, and acute on chronic diastolic heart failure with fluid overload. I have copied relevant portions of the discharge summary and included them below for my reference:  Admit date: 03/10/2017 Discharge date: 03/15/2017  Primary Care Physician:  [name], MD  Discharge Diagnoses:       Acute hypoxic respiratory failure   Multifocal pneumonia   Acute pulmonary embolism   COPD exacerbation  Acute on chronic diastolic CHF  Chronic Atrial fibrillation  . Sepsis ([place]) . Hypertension . Systolic murmur . Mitral regurgitation   Diabetes mellitus   Hypokalemia  Brief H and P: For complete details please refer to admission H and P, but in brief Patient is a 66 year old male with chronic atrial fibrillation, COPD, DJD, diabetes, hypertension, ? Diastolic CHF, pedal edema on Lasix brought to ED after he became confused at the physician's office around 10 AM in the morning, having acute onset of shortness of breath, hypoxic with O2 sat in low 80s. Reported fevers, chills or night sweats, chronic lower activity swelling and had not taken his diuretic. WBC count 18.4, lactic acid 2.89. Chest x-ray showed cardiomegaly with mild pulmonary vascular congestion, right base infiltrate suspicious for pneumonia. Patient was admitted and eventually placed on BiPAP in the stepdown unit.   Hospital Course:   Acute respiratory failure with hypoxia (HCC) secondary to COPD, acute on chronic diastolic CHF,Multifocal pneumonia -On admission WBC count elevated at 18, chest x-ray with vascular condition and right base infiltrate. Initially placed on Venti mask, then BiPAP - Currently Improving, home O2 evaluation done, needs 2 L O2 on ambulation - CTA  chest showed multifocal pneumonia, placed on IV vancomycin and cefepime on 6/22, transitioned to oral Levaquin for a week  - The patient will need a repeat chest x-ray or a CT chest in 2-3 weeks to ensure complete resolution of multifocal pneumonia   Acute pulmonary embolism - Due to persistent hypoxia, CTA chest was done. CT angiogram of the chest showed small nonocclusive pulmonary embolism in the segmental and subsegmental pulmonary arteries to the lingula (patient is on warfarin prior to admission hence a warfarin failure) - Patient was placed on IV heparin. Discussed in detail with patient and his wife about warfarin failure and likely need to be on NOAC's, discussed about risks and benefits. Patient and his wife agreeable with NOAC's - After receiving copays from the case management, patient was started on xarelto.    Acute on chronic diastolic CHF - Prior 2-D echo in 09/2015 had shown EF of 60-65%, however chest x-ray with vascular congestion, on high-dose Lasix at home with retaining fluid -Improving now with negative balance of 4.7 L, weight down from 298 on 6/21 ->281 lbs at the time of discharge - continue Lasix 80 mg twice a day, received metolazone 6/22 - 2-D echo showed EF of 55% - Patient is in a high-dose Lasix and metolazone and has not been following with cardiology outpatient. I requested cardiology to follow outpatient, appointment scheduled on 03/18/17 with Dr. Stanford Breed.   Sepsis due to the pneumonia - Patient met sepsis criteria at the time of admission given tachycardia, tachypnea, fever, hypotension, leukocytosis. Tmax in ED 103.105F - Sepsis physiology improved  Atrial fibrillation, chronic  - Rate controlled, continue metoprolol -  Warfarin discontinued as patient had pulmonary embolism with therapeutic INR - Continue xarelto, [place] vasc score 4    Hypertension - BP currently improving, placed on low-dose metoprolol, continue Lasix and metolazone  Diabetes  mellitus  - Continue metformin, hemoglobin A1c 7.2 - Outpatient follow-up with PCP  Hypokalemia - Continue scheduled potassium replacement   He is here today for follow-up. Overall he states that his breathing is better. He would like to discontinue oxygen. He has chronic venous stasis changes in both legs but there is no pitting edema. There is no weeping ulcer as it is essentially healed on his anterior right shin. There are no pulmonary crackles on exam. He does not appear to be fluid overloaded weight according to our scales is 297 lbs.  Antibiotics have helped the shortness of breath. He denies any fevers or chills. He has quit smoking which I congratulated him on. On his exam, this is the best his lungs have ever sounded. There are no expiratory wheezes. He is in nature fibrillation this morning but he is rate controlled. He is not wearing his compression hose however he is being compliant with his diuretic therapy. He was diagnosed with a pulmonary embolism despite being on an adequate dose of Coumadin. Therefore he was switched to xarelto 15 mg pobid for 3 weeks then 20 mg poqday thereafter.  He is being compliant with this. He denies any bleeding or bruising. At that time, my plan was: Regarding his pneumonia, he needs to have a chest xray scheduled for 3 weeks. I will get this arranged. Clinically however he has no evidence of pneumonia on his exam today. Regarding his pulmonary embolism, he is on appropriate anticoagulant. He will be switching to 20 mg a day saying. I will check a CBC to rule out any acute anemia or evidence of bleeding. Regarding his COPD, he is quit smoking which is the mainstay of therapy. His exam today is unremarkable. This is the best his lungs sounded in years. I will continue him on oxygen for the next month and then if he is no longer hypoxic I will discontinue it at that point. Regarding his chronic diastolic heart failure, I believe is due to a combination of  obesity, COPD, smoking. I also believe that he likely has obstructive sleep apnea. I have mentioned this the patient on numerous occasions. He is now finally willing to allow me to schedule him for a sleep study to evaluate further to that we can adequately treat him  if present.  04/29/17 Patient has quit smoking. I congratulated him. His breathing is much better. He is 98% on room air. I ambulated with the patient for more than 200 feet and his oxygen saturations remained above 96%. There is no additional wheezing. He is wearing compression hose. The swelling in his legs is under control. There is no pitting edema in his legs. There is no venous stasis ulcers weeping edema coming from his legs. He denies any chest pain. He does report dyspnea on exertion consistent with deconditioning. He is not getting any exercise. He is relatively sedentary due to pain in his back knees and feet. Past Medical History:  Diagnosis Date  . Allergy    allergic rhinitis  . Atrial fibrillation ([place])   . Colon polyps   . COPD (chronic obstructive pulmonary disease) ([place])   . Degenerative disc disease, lumbar   . Hypertension   . Pneumonia ~ 2016  . Sleep apnea   . Systolic murmur   .  Type II diabetes mellitus ([place])    Past Surgical History:  Procedure Laterality Date  . CARDIOVERSION N/A 01/09/2013   Procedure: CARDIOVERSION;  Surgeon: Fay Records, MD;  Location: [place];  Service: Cardiovascular;  Laterality: N/A;  . CARDIOVERSION N/A 04/13/2017   Procedure: CARDIOVERSION;  Surgeon: Sueanne Margarita, MD;  [facility] ENDOSCOPY;  Service: Cardiovascular;  Laterality: N/A;  . TEE WITHOUT CARDIOVERSION N/A 01/09/2013   Procedure: TRANSESOPHAGEAL ECHOCARDIOGRAM (TEE);  Surgeon: Fay Records, MD;  Location: [facility] ENDOSCOPY;  Service: Cardiovascular;  Laterality: N/A;   Current Outpatient Prescriptions on File Prior to Visit  Medication Sig Dispense Refill  . albuterol (PROVENTIL HFA;VENTOLIN HFA) 108 (90 Base)  MCG/ACT inhaler Inhale 2 puffs into the lungs every 6 (six) hours as needed for wheezing or shortness of breath. 1 Inhaler 2  . fish oil-omega-3 fatty acids 1000 MG capsule Take 1 g by mouth daily.    . furosemide (LASIX) 40 MG tablet Take 2 tablets (80 mg total) by mouth 2 (two) times daily. 180 tablet 3  . metFORMIN (GLUCOPHAGE) 1000 MG tablet TAKE ONE TABLET BY MOUTH TWICE DAILY 60 tablet 5  . metolazone (ZAROXOLYN) 5 MG tablet Take 1 pill 30 minutes before lasix on Monday only 30 tablet 0  . metoprolol tartrate (LOPRESSOR) 50 MG tablet Take 1 tablet (50 mg total) by mouth 2 (two) times daily. 60 tablet 11  . potassium chloride SA (K-DUR,KLOR-CON) 20 MEQ tablet Take 40 mEq by mouth 2 (two) times daily.    . rivaroxaban (XARELTO) 20 MG TABS tablet Take 1 tablet (20 mg total) by mouth daily with supper. Start only when starter pack is finished on 7/16 30 tablet 4   No current facility-administered medications on file prior to visit.    No Known Allergies Social History   Social History  . Marital status: Married    Spouse name: N/A  . Number of children: 2  . Years of education: N/A   Occupational History  .      Machinist   Social History Main Topics  . Smoking status: Former Smoker    Packs/day: 1.00    Years: 43.00    Types: Cigarettes    Quit date: 03/08/2017  . Smokeless tobacco: Never Used  . Alcohol use No     Comment: Rare  . Drug use: No  . Sexual activity: Not Currently   Other Topics Concern  . Not on file   Social History Narrative  . No narrative on file      Review of Systems  All other systems reviewed and are negative.      Objective:   Physical Exam  Constitutional: He appears well-developed and well-nourished. No distress.  Neck: Neck supple. No JVD present.  Cardiovascular: Normal rate.   Pulmonary/Chest: Effort normal. He has no wheezes. He has no rales.  Abdominal: Soft. Bowel sounds are normal.  Musculoskeletal: He exhibits no edema.    Skin: No rash noted. He is not diaphoretic. No erythema.          Assessment & Plan:  Follow-up from hospitalization due to community-acquired pneumonia Hypoxia requiring oxygen   Clinically the patient has improved dramatically. He is back at his baseline. He can discontinue oxygen at this point. He is scheduled for the sleep study later this month. He may benefit from CPAP. Congratulated the patient on quitting smoking. I encouraged him to get 30 minutes a day 5 days a week of aerobic exercise. He will  need to build on this gradually. I recommended water aerobics and gradually increasing his walking and exercise in the pool until he can do it for 30 minutes and then begin to exercise more vigorously outside the pool. Patient is due to recheck hemoglobin A1c fasting lipid panel and fasting lab work in 3 months.

## 2017-05-04 ENCOUNTER — Encounter: Payer: Self-pay | Admitting: Neurology

## 2017-05-04 ENCOUNTER — Ambulatory Visit (INDEPENDENT_AMBULATORY_CARE_PROVIDER_SITE_OTHER): Payer: BLUE CROSS/BLUE SHIELD | Admitting: Neurology

## 2017-05-04 VITALS — BP 110/72 | HR 76 | Ht 73.0 in | Wt 277.0 lb

## 2017-05-04 DIAGNOSIS — E669 Obesity, unspecified: Secondary | ICD-10-CM | POA: Diagnosis not present

## 2017-05-04 DIAGNOSIS — G4719 Other hypersomnia: Secondary | ICD-10-CM | POA: Diagnosis not present

## 2017-05-04 DIAGNOSIS — J449 Chronic obstructive pulmonary disease, unspecified: Secondary | ICD-10-CM | POA: Diagnosis not present

## 2017-05-04 DIAGNOSIS — R0683 Snoring: Secondary | ICD-10-CM | POA: Diagnosis not present

## 2017-05-04 DIAGNOSIS — Z9981 Dependence on supplemental oxygen: Secondary | ICD-10-CM

## 2017-05-04 DIAGNOSIS — R351 Nocturia: Secondary | ICD-10-CM | POA: Diagnosis not present

## 2017-05-04 DIAGNOSIS — I5033 Acute on chronic diastolic (congestive) heart failure: Secondary | ICD-10-CM

## 2017-05-04 DIAGNOSIS — I4891 Unspecified atrial fibrillation: Secondary | ICD-10-CM

## 2017-05-04 NOTE — Progress Notes (Signed)
Subjective:    Patient ID: William Alvarado is a 66 y.o. male.  HPI     Huston Foley, MD, PhD [facility] [address], [address] [address] [place], [place] [address]  Dear Dr. Tanya Nones,   I saw your patient, William Alvarado, upon your kind request in my neurologic clinic today for initial consultation of his sleep disorder, in particular, concern for underlying obstructive sleep apnea. The patient is accompanied by his wife and his 2 yo grandson today. As you know, William Alvarado is a 66 year old right-handed gentleman with an underlying complex medical history of A. fib, with status post cardioversion, COPD, hypertension, history of pneumonia, history of PE, congestive heart failure, type 2 diabetes, allergic rhinitis, recent smoking cessation, and obesity, who reports snoring and excessive daytime somnolence. I reviewed your office note from 04/29/2017. His Epworth sleepiness score is 18 out of 24 today, fatigue score is 56 out of 63. He lives with his wife, he is retired. He has 2 grown children. He does not drink alcohol or use illicit drugs and limits his caffeine to one cup of coffee per day and one or 2  Diet sodas, not always caffeine. He goes to bed around 9:30 and wake up time is around 7. His son has sleep apnea, he has 2 cousins with sleep apnea. His weight has come down. Sleep is interrupted secondary to nocturia which is about 5 times per average night and also otherwise. He denies morning headaches or telltale symptoms of restless legs, does not wake up rested, wife does not typically sleep in the same bedroom with him because of his disturbed sleep and his snoring. He has been using oxygen at night since his hospital discharge but was told to wean off of it. He has reduced it to 1.5 L/m, feels like it may still help at night which is why he has not completely discontinued it.  His Past Medical History Is Significant For: Past Medical History:  Diagnosis Date  .  Allergy    allergic rhinitis  . Atrial fibrillation (HCC)   . Colon polyps   . COPD (chronic obstructive pulmonary disease) (HCC)   . Degenerative disc disease, lumbar   . Hypertension   . Pneumonia ~ 2016  . Sleep apnea   . Systolic murmur   . Type II diabetes mellitus (HCC)     His Past Surgical History Is Significant For: Past Surgical History:  Procedure Laterality Date  . CARDIOVERSION N/A 01/09/2013   Procedure: CARDIOVERSION;  Surgeon: Pricilla Riffle, MD;  Location: [facility] ENDOSCOPY;  Service: Cardiovascular;  Laterality: N/A;  . CARDIOVERSION N/A 04/13/2017   Procedure: CARDIOVERSION;  Surgeon: Quintella Reichert, MD;  Location: [facility] ENDOSCOPY;  Service: Cardiovascular;  Laterality: N/A;  . TEE WITHOUT CARDIOVERSION N/A 01/09/2013   Procedure: TRANSESOPHAGEAL ECHOCARDIOGRAM (TEE);  Surgeon: Pricilla Riffle, MD;  Location: [facility] ENDOSCOPY;  Service: Cardiovascular;  Laterality: N/A;    His Family History Is Significant For: Family History  Problem Relation Age of Onset  . Cancer Mother 26       breast  . Heart disease Father 49       MI  . Cancer Brother        lung  . Stroke Brother 45    His Social History Is Significant For: Social History   Social History  . Marital status: Married    Spouse name: N/A  . Number of children: 2  . Years of education: N/A  Occupational History  .      Machinist   Social History Main Topics  . Smoking status: Former Smoker    Packs/day: 1.00    Years: 43.00    Types: Cigarettes    Quit date: 03/08/2017  . Smokeless tobacco: Never Used  . Alcohol use No     Comment: Rare  . Drug use: No  . Sexual activity: Not Currently   Other Topics Concern  . None   Social History Narrative  . None    His Allergies Are:  No Known Allergies:   His Current Medications Are:  Outpatient Encounter Prescriptions as of 05/04/2017  Medication Sig  . fish oil-omega-3 fatty acids 1000 MG capsule Take 1 g by mouth daily.  . furosemide (LASIX) 40 MG  tablet Take 2 tablets (80 mg total) by mouth 2 (two) times daily.  . metFORMIN (GLUCOPHAGE) 1000 MG tablet TAKE ONE TABLET BY MOUTH TWICE DAILY  . metolazone (ZAROXOLYN) 5 MG tablet Take 1 pill 30 minutes before lasix on Monday only  . metoprolol tartrate (LOPRESSOR) 50 MG tablet Take 1 tablet (50 mg total) by mouth 2 (two) times daily.  . potassium chloride SA (K-DUR,KLOR-CON) 20 MEQ tablet Take 40 mEq by mouth 2 (two) times daily.  . rivaroxaban (XARELTO) 20 MG TABS tablet Take 1 tablet (20 mg total) by mouth daily with supper. Start only when starter pack is finished on 7/16   No facility-administered encounter medications on file as of 05/04/2017.   :  Review of Systems:  Out of a complete 14 point review of systems, all are reviewed and negative with the exception of these symptoms as listed below: Review of Systems  Neurological:       Pt presents today to discuss his sleep. Pt has never had a sleep study but does endorse snoring.  Epworth Sleepiness Scale 0= would never doze 1= slight chance of dozing 2= moderate chance of dozing 3= high chance of dozing  Sitting and reading: 3 Watching TV: 3 Sitting inactive in a public place (ex. Theater or meeting): 3 As a passenger in a car for an hour without a break: 3 Lying down to rest in the afternoon: 2 Sitting and talking to someone: 1 Sitting quietly after lunch (no alcohol): 3 In a car, while stopped in traffic: 0 Total: 18     Objective:  Neurological Exam  Physical Exam Physical Examination:   Vitals:   05/04/17 1105  BP: 110/72  Pulse: 76    General Examination: The patient is a very pleasant 66 y.o. male in no acute distress. He appears well-developed and well-nourished and well groomed.   HEENT: Normocephalic, atraumatic, pupils are equal, round and reactive to light and accommodation. Extraocular tracking is good without limitation to gaze excursion or nystagmus noted. Normal smooth pursuit is noted. Hearing is  grossly intact. Face is symmetric with normal facial animation and normal facial sensation. Speech is clear with no dysarthria noted. There is no hypophonia. There is no lip, neck/head, jaw or voice tremor. Neck is supple with full range of passive and active motion. There are no carotid bruits on auscultation. Oropharynx exam reveals: mild mouth dryness, adequate dental hygiene with dentures in place, and mild airway crowding, due to smaller airway entry. Tonsils are on the small side, uvula is on the smaller side as well. Mallampati is class II.neck circumference is 18-1/8 inches. Tongue protrudes centrally and palate elevates symmetrically.   Chest: Clear to auscultation without wheezing, rhonchi  or crackles noted.  Heart: S1+S2+0, regular and normal without murmurs, rubs or gallops noted.   Abdomen: Soft, non-tender and non-distended with normal bowel sounds appreciated on auscultation.  Extremities: There is trace pitting edema in the distal lower extremities bilaterally. He is wearing compression socks up to knees bilaterally.  Skin: Warm and dry without trophic changes noted.   Musculoskeletal: exam reveals no obvious joint deformities, tenderness or joint swelling or erythema.   Neurologically:  Mental status: The patient is awake, alert and oriented in all 4 spheres. His immediate and remote memory, attention, language skills and fund of knowledge are appropriate. There is no evidence of aphasia, agnosia, apraxia or anomia. Speech is clear with normal prosody and enunciation. Thought process is linear. Mood is normal and affect is normal.  Cranial nerves II - XII are as described above under HEENT exam. In addition: shoulder shrug is normal with equal shoulder height noted. Motor exam: Normal bulk, strength and tone is noted. There is no drift, tremor or rebound. Romberg is negative. Reflexes are 1+ throughout. Fine motor skills and coordination: intact with normal finger taps, normal hand  movements, normal rapid alternating patting, normal foot taps and normal foot agility.  Cerebellar testing: No dysmetria or intention tremor on finger to nose testing. Heel to shin is unremarkable bilaterally. There is no truncal or gait ataxia.  Sensory exam: intact to light touch in the upper and lower extremities.  Gait, station and balance: He stands easily. No veering to one side is noted. No leaning to one side is noted. Posture is age-appropriate and stance is narrow based. Gait shows normal stride length and pace. No problems turning are noted.   Assessment and Plan:  In summary, ALEC JAROS is a very pleasant 66 y.o.-year old male with an underlying complex medical history of A. fib, with status post cardioversion, COPD, hypertension, history of pneumonia, history of PE, congestive heart failure, type 2 diabetes, allergic rhinitis, recent smoking cessation, and obesity, whose history and physical exam are concerning for obstructive sleep apnea (OSA). I had a long chat with the patient and his wife about my findings and the diagnosis of OSA, its prognosis and treatment options. We talked about medical treatments, surgical interventions and non-pharmacological approaches. I explained in particular the risks and ramifications of untreated moderate to severe OSA, especially with respect to developing cardiovascular disease down the Road, including congestive heart failure, difficult to treat hypertension, cardiac arrhythmias, or stroke. Even type 2 diabetes has, in part, been linked to untreated OSA. Symptoms of untreated OSA include daytime sleepiness, memory problems, mood irritability and mood disorder such as depression and anxiety, lack of energy, as well as recurrent headaches, especially morning headaches. We talked about ongoing smoking cessation and trying to maintain a healthy lifestyle in general, as well as the importance of weight control. I encouraged the patient to eat healthy,  exercise daily and keep well hydrated, to keep a scheduled bedtime and wake time routine, to not skip any meals and eat healthy snacks in between meals. I advised the patient not to drive when feeling sleepy. I recommended the following at this time: sleep study with potential positive airway pressure titration. (We will score hypopneas at 3%).   I explained the sleep test procedure to the patient and also outlined the CPAP treatment option to the patient, who indicated that he would be willing to try CPAP if the need arises. I explained the importance of being compliant with PAP treatment, not  only for insurance purposes but primarily to improve His symptoms, and for the patient's long term health benefit, including to reduce His cardiovascular risks. I answered all their questions today and the patient and his wife were in agreement. I would like to see him back after the sleep study is completed and encouraged him to call with any interim questions, concerns, problems or updates.   Thank you very much for allowing me to participate in the care of this nice patient. If I can be of any further assistance to you please do not hesitate to call me at [phone].  Sincerely,   Huston Foley, MD, PhD

## 2017-05-04 NOTE — Patient Instructions (Addendum)
Based on your symptoms and your exam I believe you are at risk for obstructive sleep apnea or OSA, and I think we should proceed with a sleep study to determine whether you do or do not have OSA and how severe it is. If you have more than mild OSA, I want you to consider treatment with CPAP. Please remember, the risks and ramifications of moderate to severe obstructive sleep apnea or OSA are: Cardiovascular disease, including congestive heart failure, stroke, difficult to control hypertension, arrhythmias, and even type 2 diabetes has been linked to untreated OSA. Sleep apnea causes disruption of sleep and sleep deprivation in most cases, which, in turn, can cause recurrent headaches, problems with memory, mood, concentration, focus, and vigilance. Most people with untreated sleep apnea report excessive daytime sleepiness, which can affect their ability to drive. Please do not drive if you feel sleepy.   I will likely see you back after your sleep study to go over the test results and where to go from there. We will call you after your sleep study to advise about the results (most likely, you will hear from Diana, my nurse) and to set up an appointment at the time, as necessary.    Our sleep lab administrative assistant, Dawn will meet with you or call you to schedule your sleep study. If you don't hear back from her by 2 weeks from now, please feel free to call her at [phone]. This is her direct line and please leave a message with your phone number to call back if you get the voicemail box. She will call back as soon as possible.   

## 2017-06-18 ENCOUNTER — Ambulatory Visit (INDEPENDENT_AMBULATORY_CARE_PROVIDER_SITE_OTHER): Payer: BLUE CROSS/BLUE SHIELD | Admitting: Neurology

## 2017-06-18 DIAGNOSIS — R0683 Snoring: Secondary | ICD-10-CM

## 2017-06-18 DIAGNOSIS — Z9981 Dependence on supplemental oxygen: Secondary | ICD-10-CM

## 2017-06-18 DIAGNOSIS — J449 Chronic obstructive pulmonary disease, unspecified: Secondary | ICD-10-CM

## 2017-06-18 DIAGNOSIS — G4733 Obstructive sleep apnea (adult) (pediatric): Secondary | ICD-10-CM | POA: Diagnosis not present

## 2017-06-18 DIAGNOSIS — G4720 Circadian rhythm sleep disorder, unspecified type: Secondary | ICD-10-CM

## 2017-06-18 DIAGNOSIS — I4891 Unspecified atrial fibrillation: Secondary | ICD-10-CM

## 2017-06-18 DIAGNOSIS — G4719 Other hypersomnia: Secondary | ICD-10-CM

## 2017-06-18 DIAGNOSIS — R351 Nocturia: Secondary | ICD-10-CM

## 2017-06-18 DIAGNOSIS — E669 Obesity, unspecified: Secondary | ICD-10-CM

## 2017-06-18 DIAGNOSIS — I5033 Acute on chronic diastolic (congestive) heart failure: Secondary | ICD-10-CM

## 2017-06-18 DIAGNOSIS — R9431 Abnormal electrocardiogram [ECG] [EKG]: Secondary | ICD-10-CM

## 2017-06-18 DIAGNOSIS — G4761 Periodic limb movement disorder: Secondary | ICD-10-CM

## 2017-06-21 ENCOUNTER — Telehealth: Payer: Self-pay

## 2017-06-21 NOTE — Progress Notes (Signed)
Patient referred by Dr. Tanya Nones, seen by me on 05/04/17, diagnostic PSG on 06/18/17.    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 Treatment with positive airway pressure in the form of CPAP is recommended. This will require a repeat sleep study for proper titration and mask fitting. Please explain to patient and arrange for a CPAP titration study. I have placed an order in the chart. Thanks, and please route to [facility] for scheduling next sleep study.  Huston Foley, MD, PhD [facility])

## 2017-06-21 NOTE — Telephone Encounter (Signed)
I called pt, spoke to pt's wife, Eunice Blase, per DPR. I advised pt's wife that Dr. Frances Furbish reviewed pt's sleep study results and found that pt does have moderate to severe osa. Dr. Frances Furbish recommends that pt return for a repeat sleep study in order to properly titrate the cpap and ensure a good mask fit. Pt's wife is agreeable to pt returning for a titration study. I advised pt's wife that our sleep lab will file with pt's insurance and call pt to schedule the sleep study when we hear back from the pt's insurance regarding coverage of this sleep study. Pt's wife verbalized understanding of results. Pt's wife had no questions at this time but was encouraged to call back if questions arise. Pt's wife mentioned that they want an order for the "so clean" machine to clean the cpap when Dr.Athar orders pt's cpap after the titration study.

## 2017-06-21 NOTE — Telephone Encounter (Signed)
-----   Message from Huston Foley, MD sent at 06/21/2017  8:37 AM EDT ----- Patient referred by Dr. Tanya Nones, seen by me on 05/04/17, diagnostic PSG on 06/18/17.    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 Treatment with positive airway pressure in the form of CPAP is recommended. This will require a repeat sleep study for proper titration and mask fitting. Please explain to patient and arrange for a CPAP titration study. I have placed an order in the chart. Thanks, and please route to [facility] for scheduling next sleep study.  Huston Foley, MD, PhD [facility])

## 2017-06-21 NOTE — Procedures (Signed)
PATIENT'S NAME:  William Alvarado, William Alvarado DOB:      09-Dec-1950      MR#:    [id number]     DATE OF RECORDING: 06/18/2017 REFERRING M.D.:  Lynnea Ferrier, MD Study Performed:   Baseline Polysomnogram HISTORY: 66 year old man with a history of A. fib, with status post cardioversion, COPD, hypertension, history of pneumonia, history of PE, congestive heart failure, type 2 diabetes, allergic rhinitis, recent smoking cessation, and obesity, who reports snoring and excessive daytime somnolence. The patient endorsed the Epworth Sleepiness Scale at 18 points. The patient's weight 277 pounds with a height of 73 (inches), resulting in a BMI of 36.8 kg/m2. The patient's neck circumference measured 18 inches.  CURRENT MEDICATIONS: Fish oil, Lasix, Glucophage, Zaroxolyn, Lopressor, K-Dur, Xarelto, Oxygen   PROCEDURE:  This is a multichannel digital polysomnogram utilizing the Somnostar 11.2 system.  Electrodes and sensors were applied and monitored per AASM Specifications.   EEG, EOG, Chin and Limb EMG, were sampled at 200 Hz.  ECG, Snore and Nasal Pressure, Thermal Airflow, Respiratory Effort, CPAP Flow and Pressure, Oximetry was sampled at 50 Hz. Digital video and audio were recorded.      BASELINE STUDY  The study was conducted without supplemental O2. Patient slept on 2 pillows. Lights Out was at 20:42 and Lights On at 04:59.  Total recording time (TRT) was 497.5 minutes, with a total sleep time (TST) of  353.5 minutes.   The patient's sleep latency was 32 minutes.  REM latency was 271 minutes, which is markedly delayed. The sleep efficiency was 71.1%.     SLEEP ARCHITECTURE: WASO (Wake after sleep onset) was 129 minutes with moderate to severe sleep fragmentation noted. There were 20.5 minutes in Stage N1, 260 minutes Stage N2, 15 minutes Stage N3 and 58 minutes in Stage REM.  The percentage of Stage N1 was 5.8%, Stage N2 was 73.6%, which is increased, Stage N3 was 4.2% and Stage R (REM sleep) was 16.4%, which is  mildly reduced. The arousals were noted as: 76 were spontaneous, 16 were associated with PLMs, 102 were associated with respiratory events. Audio and video analysis did not show any abnormal or unusual movements, behaviors, phonations or vocalizations. The patient took bathroom 2 breaks. Mild to moderate snoring was noted. The EKG showed irregularities including PVCs and questionable atrial flutter.   RESPIRATORY ANALYSIS:  There were a total of 124 respiratory events:  18 obstructive apneas, 0 central apneas and 1 mixed apneas with a total of 19 apneas and an apnea index (AI) of 3.2 /hour. There were 105 hypopneas with a hypopnea index of 17.8 /hour. The patient also had 0 respiratory event related arousals (RERAs).      The total APNEA/HYPOPNEA INDEX (AHI) was 21./hour and the total RESPIRATORY DISTURBANCE INDEX was 21. /hour.  8 events occurred in REM sleep and 195 events in NREM. The REM AHI was 8.3 /hour, versus a non-REM AHI of 23.6. The patient spent 60 minutes of total sleep time in the supine position and 294 minutes in non-supine.. The supine AHI was 31.0 versus a non-supine AHI of 19.0.  OXYGEN SATURATION & C02:  The Wake baseline 02 saturation was 92%, with the lowest being 68%. Time spent below 89% saturation equaled 68 minutes, however, this may be an overestimation as the sensor was at times off or showed faulty signal. The nadir of 68% appeared correct.   PERIODIC LIMB MOVEMENTS: The patient had a total of 100 Periodic Limb Movements.  The Periodic Limb Movement (PLM)  index was 17/hour and the PLM Arousal index was 2.7/hour. Post-study, the patient indicated that sleep was the same as usual.   IMPRESSION: 1. Obstructive Sleep Apnea (OSA) 2. Periodic Limb Movement Disorder (PLMD) 3. Non-specific abnormal EKG 4. Dysfunctions associated with sleep stages or arousal from sleep  RECOMMENDATIONS: 1. This study demonstrates moderate to severe obstructive sleep apnea, with a total AHI of  21/hour, REM AHI of 8.3/hour, supine AHI of 31/hour and O2 nadir of 68%. Treatment with positive airway pressure in the form of CPAP is recommended. This will require a full night titration study to optimize therapy. Other treatment options may include avoidance of supine sleep position along with weight loss, upper airway or jaw surgery in selected patients or the use of an oral appliance in certain patients. ENT evaluation and/or consultation with a maxillofacial surgeon or dentist may be feasible in some instances.    2. Please note that untreated obstructive sleep apnea carries additional perioperative morbidity. Patients with significant obstructive sleep apnea should receive perioperative PAP therapy and the surgeons and particularly the anesthesiologist should be informed of the diagnosis and the severity of the sleep disordered breathing. 3. Mild PLMs (periodic limb movements of sleep) were noted during the study without significant arousals; clinical correlation is recommended.  4. The study showed abnormalities on single lead EKG; clinical correlation is recommended and follow up with cardiology may be feasible.  5. This study shows sleep fragmentation and abnormal sleep stage percentages; these are nonspecific findings and per se do not signify an intrinsic sleep disorder or a cause for the patient's sleep-related symptoms. Causes include (but are not limited to) the first night effect of the sleep study, circadian rhythm disturbances, medication effect or an underlying mood disorder or medical problem.  6. The patient should be cautioned not to drive, work at heights, or operate dangerous or heavy equipment when tired or sleepy. Review and reiteration of good sleep hygiene measures should be pursued with any patient. 7. The patient will be seen in follow-up by Dr. Frances Furbish at [facility] for discussion of the test results and further management strategies. The referring provider will be notified of the test  results.  I certify that I have reviewed the entire raw data recording prior to the issuance of this report in accordance with the Standards of Accreditation of the American Academy of Sleep Medicine (AASM)   Huston Foley, MD, PhD Diplomat, American Board of Psychiatry and Neurology (Neurology and Sleep Medicine)

## 2017-06-21 NOTE — Addendum Note (Signed)
Addended by: Huston Foley on: 06/21/2017 08:37 AM   Modules accepted: Orders

## 2017-06-29 ENCOUNTER — Telehealth: Payer: Self-pay | Admitting: Neurology

## 2017-06-29 DIAGNOSIS — G4733 Obstructive sleep apnea (adult) (pediatric): Secondary | ICD-10-CM

## 2017-06-29 NOTE — Telephone Encounter (Signed)
[employer] denied CPAP titration °

## 2017-06-30 ENCOUNTER — Telehealth: Payer: Self-pay

## 2017-06-30 NOTE — Telephone Encounter (Signed)
patient wife is calling and requesting another sleep study test done because they need to analyze the pressure on the test and ins will not cover unless provider orders another test Is it ok to place the order

## 2017-07-01 NOTE — Telephone Encounter (Signed)
Ok will ask kim to order.

## 2017-07-05 NOTE — Telephone Encounter (Signed)
We will set patient up with autoPAP at home, as insurance denied in house titration study for OSA, despite severe desats noted on PST and AHI in the moderate to severe range. Pls process order and notify patient and set up FU in 10 weeks with me or NP.  Will order So Clean machine as well.

## 2017-07-05 NOTE — Addendum Note (Signed)
Addended by: Huston Foley on: 07/05/2017 05:14 PM   Modules accepted: Orders

## 2017-07-06 ENCOUNTER — Telehealth: Payer: Self-pay | Admitting: Neurology

## 2017-07-06 NOTE — Telephone Encounter (Signed)
Order is in at [facility] sleep.  They have submitted to insurance for approval and are waiting for answer.  Spoke to wife and she now has me confused as to the process. LMTCB at sleep lab

## 2017-07-06 NOTE — Telephone Encounter (Signed)
[employer] denied CPAP °

## 2017-07-06 NOTE — Telephone Encounter (Signed)
Sleep lab has called to say that insurance has denied titration sleep study.

## 2017-07-06 NOTE — Telephone Encounter (Signed)
I called pt to discuss. No answer, left a message asking him to call me back. 

## 2017-07-08 NOTE — Telephone Encounter (Signed)
Pt wife(on DPR) calling to inform insurance has denied 2nd sleep study so she is asking for a call back re: what the next plan of action is for pt.

## 2017-07-10 ENCOUNTER — Other Ambulatory Visit: Payer: Self-pay | Admitting: Family Medicine

## 2017-07-10 DIAGNOSIS — I2609 Other pulmonary embolism with acute cor pulmonale: Secondary | ICD-10-CM

## 2017-07-12 NOTE — Telephone Encounter (Signed)
I called pt. I advised him that his insurance denied the in-lab sleep study for a cpap titration. Dr. Frances Furbish recommends that pt start an auto pap at home. I reviewed PAP compliance expectations with the pt. Pt is agreeable to starting an auto-PAP. I advised pt that an order will be sent to a DME, [facility], and [facility] will call the pt within about one week after they file with the pt's insurance. [facility] will show the pt how to use the machine, fit for masks, and troubleshoot the auto-PAP if needed. A follow up appt was made for insurance purposes with Dr. Frances Furbish on 10/13/17 at 9:30am. Pt has a very difficult schedule and was hard pressed to find a date and time that worked for him. Pt verbalized understanding to arrive 15 minutes early and bring their auto-PAP. A letter with all of this information in it will be mailed to the pt as a reminder. I verified with the pt that the address we have on file is correct. Pt verbalized understanding of results. Pt had no questions at this time but was encouraged to call back if questions arise.

## 2017-07-12 NOTE — Telephone Encounter (Signed)
See other phone note from today.

## 2017-07-21 NOTE — Progress Notes (Signed)
HPI: Fu atrial fibrillation. Patient underwent cardioversion in 2014. Echocardiogram June 2018 showed normal LV function, mild aortic stenosis with mean gradient 9 mmHg, trace aortic insufficiency, mild mitral stenosis/mild mitral regurgitation, biatrial enlargement. ABIs June 2018 normal. CTA June 2018 showed small nonocclusive pulmonary embolus. Placed on anticoagulation. Patient noted to be in atrial fibrillation at office visit in June 2018. Underwent successful cardioversion July 2018. Since last seen, patient has some dyspnea on exertion but no orthopnea, PND, pedal edema, chest pain or syncope. No bleeding.  Current Outpatient Medications  Medication Sig Dispense Refill  . fish oil-omega-3 fatty acids 1000 MG capsule Take 1 g by mouth daily.    . furosemide (LASIX) 40 MG tablet Take 2 tablets (80 mg total) by mouth 2 (two) times daily. 180 tablet 3  . metFORMIN (GLUCOPHAGE) 1000 MG tablet TAKE ONE TABLET BY MOUTH TWICE DAILY 60 tablet 5  . metolazone (ZAROXOLYN) 5 MG tablet Take 1 pill 30 minutes before lasix on Monday only 30 tablet 0  . metoprolol tartrate (LOPRESSOR) 50 MG tablet Take 1 tablet (50 mg total) by mouth 2 (two) times daily. 60 tablet 11  . potassium chloride SA (K-DUR,KLOR-CON) 20 MEQ tablet Take 40 mEq by mouth 2 (two) times daily.    . rivaroxaban (XARELTO) 20 MG TABS tablet Take 1 tablet (20 mg total) by mouth daily with supper. Start only when starter pack is finished on 7/16 30 tablet 4   No current facility-administered medications for this visit.      Past Medical History:  Diagnosis Date  . Acute respiratory failure with hypoxia (HCC)    Hospitalized with acute respiratory failure secondary to CAP, diastolic CHF, and pulmonary embolism  . AKI (acute kidney injury) (HCC)   . Allergy    allergic rhinitis  . Altered mental status   . Atrial fibrillation (HCC)   . Chronic anticoagulation 03/26/2017   Failed Coumadin (PE with therapeutic INR). Now on  Xarelto  . Colon polyps   . Community acquired pneumonia of right lower lobe of lung (HCC)   . COPD (chronic obstructive pulmonary disease) (HCC)   . Degenerative disc disease, lumbar   . Diabetes mellitus without complication (HCC)   . History of pulmonary embolus (PE) 03/26/2017  . Hypertension   . Mitral regurgitation 12/26/2012  . Obesity (BMI 30-39.9) 12/09/2012  . PAF (paroxysmal atrial fibrillation) (HCC)    DCCV in 2014 with recurrent PAF in setting of respiratory failure June 2018. S/P DCCV 04/13/17 to NSR  . Persistent atrial fibrillation (HCC)   . Pneumonia ~ 2016  . Sepsis (HCC) 03/10/2017  . Sleep apnea   . Systolic murmur   . Type II diabetes mellitus (HCC)     History reviewed. No pertinent surgical history.  Social History   Socioeconomic History  . Marital status: Married    Spouse name: Not on file  . Number of children: 2  . Years of education: Not on file  . Highest education level: Not on file  Social Needs  . Financial resource strain: Not on file  . Food insecurity - worry: Not on file  . Food insecurity - inability: Not on file  . Transportation needs - medical: Not on file  . Transportation needs - non-medical: Not on file  Occupational History    Comment: Machinist  Tobacco Use  . Smoking status: Former Smoker    Packs/day: 1.00    Years: 43.00    Pack years: 43.00  Types: Cigarettes    Last attempt to quit: 03/08/2017    Years since quitting: 0.4  . Smokeless tobacco: Never Used  Substance and Sexual Activity  . Alcohol use: No    Comment: Rare  . Drug use: No  . Sexual activity: Not Currently  Other Topics Concern  . Not on file  Social History Narrative  . Not on file    Family History  Problem Relation Age of Onset  . Cancer Mother 3250       breast  . Heart disease Father 2050       MI  . Cancer Brother        lung  . Stroke Brother 45    ROS: no fevers or chills, productive cough, hemoptysis, dysphasia, odynophagia, melena,  hematochezia, dysuria, hematuria, rash, seizure activity, orthopnea, PND, pedal edema, claudication. Remaining systems are negative.  Physical Exam: Well-developed obese in no acute distress.  Skin is warm and dry.  HEENT is normal.  Neck is supple.  Chest is clear to auscultation with normal expansion.  Cardiovascular exam is regular rate and rhythm.  Abdominal exam nontender or distended. No masses palpated. Extremities show no edema; chronic skin changes neuro grossly intact   ECG- sinus rhythm at a rate of 64. First degree AV block. Cannot rule out prior septal infarct.personally reviewed  A/P  1 paroxysmal atrial fibrillation-patient remains in sinus rhythm today. Continue xarelto. Continue metoprolol for rate control if atrial fibrillation recurs.  2 prior pulmonary embolus-continue anticoagulation both for pulmonary embolus and paroxysmal atrial fibrillation.   3 hypertension-blood pressure controlled. Continue present medications.  4 valvular heart disease-patient has history of mild aortic and mitral stenosis and mild mitral regurgitation. He will need follow-up echo is in the future.  5 morbid obesity-patient needs weight loss.   6 tobacco abuse-patient has continued.  7 chronic diastolic congestive heart failure-Euvolemic on exam. Continue present dose of Lasix.   8 obstructive sleep apnea-patient has been initiated on CPAP.  Olga MillersBrian Glennie Rodda, MD

## 2017-07-30 ENCOUNTER — Encounter: Payer: Self-pay | Admitting: Family Medicine

## 2017-07-30 ENCOUNTER — Ambulatory Visit: Payer: BLUE CROSS/BLUE SHIELD | Admitting: Family Medicine

## 2017-07-30 ENCOUNTER — Other Ambulatory Visit: Payer: Self-pay

## 2017-07-30 VITALS — BP 120/64 | HR 62 | Temp 98.2°F | Resp 20 | Ht 73.0 in | Wt 298.0 lb

## 2017-07-30 DIAGNOSIS — I10 Essential (primary) hypertension: Secondary | ICD-10-CM | POA: Diagnosis not present

## 2017-07-30 DIAGNOSIS — I482 Chronic atrial fibrillation, unspecified: Secondary | ICD-10-CM

## 2017-07-30 DIAGNOSIS — Z23 Encounter for immunization: Secondary | ICD-10-CM | POA: Diagnosis not present

## 2017-07-30 DIAGNOSIS — E119 Type 2 diabetes mellitus without complications: Secondary | ICD-10-CM

## 2017-07-30 NOTE — Addendum Note (Signed)
Addended by: Legrand Rams B on: 07/30/2017 12:41 PM   Modules accepted: Orders

## 2017-07-30 NOTE — Progress Notes (Signed)
Subjective:    Patient ID: William Alvarado, male    DOB: Jul 29, 1951, 66 y.o.   MRN: [id number]  HPI  03/2017 Patient was recently admitted to the hospital with community-acquired pneumonia, altered mental status secondary to hypoxia, pulmonary embolism, and acute on chronic diastolic heart failure with fluid overload. I have copied relevant portions of the discharge summary and included them below for my reference:  Admit date: 03/10/2017 Discharge date: 03/15/2017  Primary Care Physician:  [name], MD  Discharge Diagnoses:       Acute hypoxic respiratory failure   Multifocal pneumonia   Acute pulmonary embolism   COPD exacerbation  Acute on chronic diastolic CHF  Chronic Atrial fibrillation  . Sepsis ([place]) . Hypertension . Systolic murmur . Mitral regurgitation   Diabetes mellitus   Hypokalemia  Brief H and P: For complete details please refer to admission H and P, but in brief Patient is a 66 year old male with chronic atrial fibrillation, COPD, DJD, diabetes, hypertension, ? Diastolic CHF, pedal edema on Lasix brought to ED after he became confused at the physician's office around 10 AM in the morning, having acute onset of shortness of breath, hypoxic with O2 sat in low 80s. Reported fevers, chills or night sweats, chronic lower activity swelling and had not taken his diuretic. WBC count 18.4, lactic acid 2.89. Chest x-ray showed cardiomegaly with mild pulmonary vascular congestion, right base infiltrate suspicious for pneumonia. Patient was admitted and eventually placed on BiPAP in the stepdown unit.   Hospital Course:   Acute respiratory failure with hypoxia (HCC) secondary to COPD, acute on chronic diastolic CHF,Multifocal pneumonia -On admission WBC count elevated at 18, chest x-ray with vascular condition and right base infiltrate. Initially placed on Venti mask, then BiPAP - Currently Improving, home O2 evaluation done, needs 2 L O2 on ambulation - CTA  chest showed multifocal pneumonia, placed on IV vancomycin and cefepime on 6/22, transitioned to oral Levaquin for a week  - The patient will need a repeat chest x-ray or a CT chest in 2-3 weeks to ensure complete resolution of multifocal pneumonia   Acute pulmonary embolism - Due to persistent hypoxia, CTA chest was done. CT angiogram of the chest showed small nonocclusive pulmonary embolism in the segmental and subsegmental pulmonary arteries to the lingula (patient is on warfarin prior to admission hence a warfarin failure) - Patient was placed on IV heparin. Discussed in detail with patient and his wife about warfarin failure and likely need to be on NOAC's, discussed about risks and benefits. Patient and his wife agreeable with NOAC's - After receiving copays from the case management, patient was started on xarelto.    Acute on chronic diastolic CHF - Prior 2-D echo in 09/2015 had shown EF of 60-65%, however chest x-ray with vascular congestion, on high-dose Lasix at home with retaining fluid -Improving now with negative balance of 4.7 L, weight down from 298 on 6/21 ->281 lbs at the time of discharge - continue Lasix 80 mg twice a day, received metolazone 6/22 - 2-D echo showed EF of 55% - Patient is in a high-dose Lasix and metolazone and has not been following with cardiology outpatient. I requested cardiology to follow outpatient, appointment scheduled on 03/18/17 with Dr. Stanford Breed.   Sepsis due to the pneumonia - Patient met sepsis criteria at the time of admission given tachycardia, tachypnea, fever, hypotension, leukocytosis. Tmax in ED 103.17F - Sepsis physiology improved  Atrial fibrillation, chronic  - Rate controlled, continue metoprolol -  Warfarin discontinued as patient had pulmonary embolism with therapeutic INR - Continue xarelto, [place] vasc score 4    Hypertension - BP currently improving, placed on low-dose metoprolol, continue Lasix and metolazone  Diabetes  mellitus  - Continue metformin, hemoglobin A1c 7.2 - Outpatient follow-up with PCP  Hypokalemia - Continue scheduled potassium replacement   He is here today for follow-up. Overall he states that his breathing is better. He would like to discontinue oxygen. He has chronic venous stasis changes in both legs but there is no pitting edema. There is no weeping ulcer as it is essentially healed on his anterior right shin. There are no pulmonary crackles on exam. He does not appear to be fluid overloaded weight according to our scales is 297 lbs.  Antibiotics have helped the shortness of breath. He denies any fevers or chills. He has quit smoking which I congratulated him on. On his exam, this is the best his lungs have ever sounded. There are no expiratory wheezes. He is in nature fibrillation this morning but he is rate controlled. He is not wearing his compression hose however he is being compliant with his diuretic therapy. He was diagnosed with a pulmonary embolism despite being on an adequate dose of Coumadin. Therefore he was switched to xarelto 15 mg pobid for 3 weeks then 20 mg poqday thereafter.  He is being compliant with this. He denies any bleeding or bruising. At that time, my plan was: Regarding his pneumonia, he needs to have a chest xray scheduled for 3 weeks. I will get this arranged. Clinically however he has no evidence of pneumonia on his exam today. Regarding his pulmonary embolism, he is on appropriate anticoagulant. He will be switching to 20 mg a day saying. I will check a CBC to rule out any acute anemia or evidence of bleeding. Regarding his COPD, he is quit smoking which is the mainstay of therapy. His exam today is unremarkable. This is the best his lungs sounded in years. I will continue him on oxygen for the next month and then if he is no longer hypoxic I will discontinue it at that point. Regarding his chronic diastolic heart failure, I believe is due to a combination of  obesity, COPD, smoking. I also believe that he likely has obstructive sleep apnea. I have mentioned this the patient on numerous occasions. He is now finally willing to allow me to schedule him for a sleep study to evaluate further to that we can adequately treat him  if present.  04/29/17 Patient has quit smoking. I congratulated him. His breathing is much better. He is 98% on room air. I ambulated with the patient for more than 200 feet and his oxygen saturations remained above 96%. There is no additional wheezing. He is wearing compression hose. The swelling in his legs is under control. There is no pitting edema in his legs. There is no venous stasis ulcers weeping edema coming from his legs. He denies any chest pain. He does report dyspnea on exertion consistent with deconditioning. He is not getting any exercise. He is relatively sedentary due to pain in his back knees and feet.  At that time, my plan was: Clinically the patient has improved dramatically. He is back at his baseline. He can discontinue oxygen at this point. He is scheduled for the sleep study later this month. He may benefit from CPAP. Congratulated the patient on quitting smoking. I encouraged him to get 30 minutes a day 5 days a week  of aerobic exercise. He will need to build on this gradually. I recommended water aerobics and gradually increasing his walking and exercise in the pool until he can do it for 30 minutes and then begin to exercise more vigorously outside the pool. Patient is due to recheck hemoglobin A1c fasting lipid panel and fasting lab work in 3 months.  07/30/17 Patient is here today to recheck his diabetes.  He denies any polyuria, polydipsia, or blurry vision.  He denies any neuropathy in his feet.  He is due for a diabetic eye exam and I recommended he schedule that at.  Diabetic foot exam is performed today and reveals mild decreased sensation to 10 g monofilament on the ball of his right foot.  He is also due for  urine microalbumin.  Regarding his chronic diastolic heart failure, his weight is stable.  There is no pitting edema in his legs.  Since I last saw him, he has quit smoking and I am extremely proud of him for this.  He has not had any recent COPD exacerbations since his last hospitalization.  He also recently went for a sleep study and was diagnosed with obstructive sleep apnea.  He is now compliant wearing his CPAP machine for the last week and does feel better.  He is due for fasting lab work.  Regarding his atrial fibrillation, he is in normal sinus rhythm today but he is still appropriately anticoagulated with Xarelto.  I will discontinue/cancel his INR order Past Medical History:  Diagnosis Date  . Acute respiratory failure with hypoxia (HCC)    Hospitalized with acute respiratory failure secondary to CAP, diastolic CHF, and pulmonary embolism  . AKI (acute kidney injury) ([place])   . Allergy    allergic rhinitis  . Altered mental status   . Atrial fibrillation ([place])   . Chronic anticoagulation 03/26/2017   Failed Coumadin (PE with therapeutic INR). Now on Xarelto  . Colon polyps   . Community acquired pneumonia of right lower lobe of lung ([place])   . COPD (chronic obstructive pulmonary disease) ([place])   . Degenerative disc disease, lumbar   . Diabetes mellitus without complication ([place])   . History of pulmonary embolus (PE) 03/26/2017  . Hypertension   . Mitral regurgitation 12/26/2012  . Obesity (BMI 30-39.9) 12/09/2012  . PAF (paroxysmal atrial fibrillation) ([place])    DCCV in 2014 with recurrent PAF in setting of respiratory failure June 2018. S/P DCCV 04/13/17 to NSR  . Persistent atrial fibrillation ([place])   . Pneumonia ~ 2016  . Sepsis ([place]) 03/10/2017  . Sleep apnea   . Systolic murmur   . Type II diabetes mellitus ([place])    No past surgical history on file. Current Outpatient Medications on File Prior to Visit  Medication Sig Dispense Refill  . fish oil-omega-3 fatty acids 1000 MG capsule  Take 1 g by mouth daily.    . furosemide (LASIX) 40 MG tablet Take 2 tablets (80 mg total) by mouth 2 (two) times daily. 180 tablet 3  . furosemide (LASIX) 40 MG tablet TAKE 3 TABLETS BY MOUTH TWICE A DAY 180 tablet 3  . metFORMIN (GLUCOPHAGE) 1000 MG tablet TAKE ONE TABLET BY MOUTH TWICE DAILY 60 tablet 5  . metolazone (ZAROXOLYN) 5 MG tablet Take 1 pill 30 minutes before lasix on Monday only 30 tablet 0  . metoprolol tartrate (LOPRESSOR) 50 MG tablet Take 1 tablet (50 mg total) by mouth 2 (two) times daily. 60 tablet 11  . potassium chloride  SA (K-DUR,KLOR-CON) 20 MEQ tablet Take 40 mEq by mouth 2 (two) times daily.    . rivaroxaban (XARELTO) 20 MG TABS tablet Take 1 tablet (20 mg total) by mouth daily with supper. Start only when starter pack is finished on 7/16 30 tablet 4   No current facility-administered medications on file prior to visit.    No Known Allergies Social History   Socioeconomic History  . Marital status: Married    Spouse name: Not on file  . Number of children: 2  . Years of education: Not on file  . Highest education level: Not on file  Social Needs  . Financial resource strain: Not on file  . Food insecurity - worry: Not on file  . Food insecurity - inability: Not on file  . Transportation needs - medical: Not on file  . Transportation needs - non-medical: Not on file  Occupational History    Comment: Machinist  Tobacco Use  . Smoking status: Former Smoker    Packs/day: 1.00    Years: 43.00    Pack years: 43.00    Types: Cigarettes    Last attempt to quit: 03/08/2017    Years since quitting: 0.3  . Smokeless tobacco: Never Used  Substance and Sexual Activity  . Alcohol use: No    Comment: Rare  . Drug use: No  . Sexual activity: Not Currently  Other Topics Concern  . Not on file  Social History Narrative  . Not on file      Review of Systems  All other systems reviewed and are negative.      Objective:   Physical Exam  Constitutional: He  appears well-developed and well-nourished. No distress.  Neck: Neck supple. No JVD present.  Cardiovascular: Normal rate.  Pulmonary/Chest: Effort normal. He has no wheezes. He has no rales.  Abdominal: Soft. Bowel sounds are normal.  Musculoskeletal: He exhibits no edema.  Skin: No rash noted. He is not diaphoretic. No erythema.          Assessment & Plan:  Controlled type 2 diabetes mellitus without complication, without long-term current use of insulin ([place]) - Plan: CBC with Differential/Platelet, COMPLETE METABOLIC PANEL WITH GFR, Lipid panel, Microalbumin, urine, Hemoglobin A1c  Chronic atrial fibrillation (HCC) - Plan: Protime-INR  Morbid obesity ([place])  Essential hypertension  Recommended diabetic eye exam.  Diabetic foot exam reveals mildly diminished sensation in the right foot.  I will check a hemoglobin A1c.  I will check a urine microalbumin.  I will check a fasting lipid panel.  Patient is no longer on Coumadin and I will canceled INR order placed by my nurse.  Continue to encourage aerobic exercise and weight loss.  Blood pressure today is excellent.

## 2017-07-31 LAB — COMPLETE METABOLIC PANEL WITHOUT GFR
AG Ratio: 1.3 (calc) (ref 1.0–2.5)
ALT: 25 U/L (ref 9–46)
AST: 20 U/L (ref 10–35)
Albumin: 4.3 g/dL (ref 3.6–5.1)
Alkaline phosphatase (APISO): 66 U/L (ref 40–115)
BUN/Creatinine Ratio: 19 (calc) (ref 6–22)
BUN: 32 mg/dL — ABNORMAL HIGH (ref 7–25)
CO2: 33 mmol/L — ABNORMAL HIGH (ref 20–32)
Calcium: 9.6 mg/dL (ref 8.6–10.3)
Chloride: 93 mmol/L — ABNORMAL LOW (ref 98–110)
Creat: 1.68 mg/dL — ABNORMAL HIGH (ref 0.70–1.25)
GFR, Est African American: 49 mL/min/1.73m2 — ABNORMAL LOW
GFR, Est Non African American: 42 mL/min/1.73m2 — ABNORMAL LOW
Globulin: 3.3 g/dL (ref 1.9–3.7)
Glucose, Bld: 153 mg/dL — ABNORMAL HIGH (ref 65–99)
Potassium: 4.3 mmol/L (ref 3.5–5.3)
Sodium: 136 mmol/L (ref 135–146)
Total Bilirubin: 0.5 mg/dL (ref 0.2–1.2)
Total Protein: 7.6 g/dL (ref 6.1–8.1)

## 2017-07-31 LAB — CBC WITH DIFFERENTIAL/PLATELET
Basophils Absolute: 104 {cells}/uL (ref 0–200)
Basophils Relative: 1 %
Eosinophils Absolute: 354 {cells}/uL (ref 15–500)
Eosinophils Relative: 3.4 %
HCT: 42.5 % (ref 38.5–50.0)
Hemoglobin: 14.4 g/dL (ref 13.2–17.1)
Lymphs Abs: 1934 {cells}/uL (ref 850–3900)
MCH: 31.8 pg (ref 27.0–33.0)
MCHC: 33.9 g/dL (ref 32.0–36.0)
MCV: 93.8 fL (ref 80.0–100.0)
MPV: 10.5 fL (ref 7.5–12.5)
Monocytes Relative: 10.9 %
Neutro Abs: 6874 {cells}/uL (ref 1500–7800)
Neutrophils Relative %: 66.1 %
Platelets: 310 Thousand/uL (ref 140–400)
RBC: 4.53 Million/uL (ref 4.20–5.80)
RDW: 13 % (ref 11.0–15.0)
Total Lymphocyte: 18.6 %
WBC mixed population: 1134 {cells}/uL — ABNORMAL HIGH (ref 200–950)
WBC: 10.4 Thousand/uL (ref 3.8–10.8)

## 2017-07-31 LAB — LIPID PANEL
Cholesterol: 169 mg/dL
HDL: 35 mg/dL — ABNORMAL LOW
LDL Cholesterol (Calc): 103 mg/dL — ABNORMAL HIGH
Non-HDL Cholesterol (Calc): 134 mg/dL — ABNORMAL HIGH
Total CHOL/HDL Ratio: 4.8 (calc)
Triglycerides: 192 mg/dL — ABNORMAL HIGH

## 2017-07-31 LAB — MICROALBUMIN, URINE: MICROALB UR: 7.5 mg/dL

## 2017-07-31 LAB — HEMOGLOBIN A1C
EAG (MMOL/L): 8.5 (calc)
Hgb A1c MFr Bld: 7 % of total Hgb — ABNORMAL HIGH (ref <5.7)
MEAN PLASMA GLUCOSE: 154 (calc)

## 2017-07-31 LAB — PROTIME-INR
INR: 1.1
Prothrombin Time: 11.8 s — ABNORMAL HIGH (ref 9.0–11.5)

## 2017-08-02 ENCOUNTER — Ambulatory Visit: Payer: BLUE CROSS/BLUE SHIELD | Admitting: Cardiology

## 2017-08-02 ENCOUNTER — Encounter: Payer: Self-pay | Admitting: Cardiology

## 2017-08-02 ENCOUNTER — Other Ambulatory Visit: Payer: Self-pay | Admitting: Family Medicine

## 2017-08-02 VITALS — BP 130/72 | HR 64 | Ht 73.0 in | Wt 304.0 lb

## 2017-08-02 DIAGNOSIS — I5032 Chronic diastolic (congestive) heart failure: Secondary | ICD-10-CM | POA: Diagnosis not present

## 2017-08-02 DIAGNOSIS — I10 Essential (primary) hypertension: Secondary | ICD-10-CM | POA: Diagnosis not present

## 2017-08-02 DIAGNOSIS — I480 Paroxysmal atrial fibrillation: Secondary | ICD-10-CM

## 2017-08-02 DIAGNOSIS — E669 Obesity, unspecified: Secondary | ICD-10-CM | POA: Diagnosis not present

## 2017-08-02 MED ORDER — SITAGLIPTIN PHOSPHATE 100 MG PO TABS: 100.0000 mg | ORAL_TABLET | Freq: Every day | ORAL | 3 refills | Status: DC

## 2017-08-02 NOTE — Progress Notes (Signed)
januvia

## 2017-08-02 NOTE — Patient Instructions (Signed)
Your physician wants you to follow-up in: 6 MONTHS WITH DR CRENSHAW You will receive a reminder letter in the mail two months in advance. If you don't receive a letter, please call our office to schedule the follow-up appointment.   If you need a refill on your cardiac medications before your next appointment, please call your pharmacy.  

## 2017-08-04 ENCOUNTER — Encounter: Payer: Self-pay | Admitting: Family Medicine

## 2017-08-05 ENCOUNTER — Telehealth: Payer: Self-pay | Admitting: Family Medicine

## 2017-08-05 DIAGNOSIS — Z0100 Encounter for examination of eyes and vision without abnormal findings: Principal | ICD-10-CM

## 2017-08-05 DIAGNOSIS — E119 Type 2 diabetes mellitus without complications: Secondary | ICD-10-CM

## 2017-08-05 NOTE — Telephone Encounter (Signed)
Marchelle Folks with [facility] calling to request we do a referral for pt, diabetic eye exam.

## 2017-08-06 NOTE — Telephone Encounter (Signed)
Referral placed.

## 2017-09-08 ENCOUNTER — Other Ambulatory Visit: Payer: Self-pay | Admitting: Family Medicine

## 2017-09-08 MED ORDER — POTASSIUM CHLORIDE CRYS ER 20 MEQ PO TBCR: 40.0000 meq | EXTENDED_RELEASE_TABLET | Freq: Two times a day (BID) | ORAL | 3 refills | Status: DC

## 2017-09-17 ENCOUNTER — Other Ambulatory Visit: Payer: Self-pay | Admitting: *Deleted

## 2017-09-17 MED ORDER — RIVAROXABAN 20 MG PO TABS: 20.0000 mg | ORAL_TABLET | Freq: Every day | ORAL | 3 refills | Status: DC

## 2017-09-21 ENCOUNTER — Other Ambulatory Visit: Payer: Self-pay | Admitting: Family Medicine

## 2017-09-22 NOTE — Telephone Encounter (Signed)
Received this notice from [facility]: "This patient returned his Cpap today AMA due to not being able to get use to it.  He advised that he takes Fluid pills and gets up multiple times per night and was never able to wear enough. I just wanted to make you and Dr. Frances Furbish aware. "

## 2017-09-22 NOTE — Telephone Encounter (Signed)
I called pt and explained this to him. Pt says that he has a lot going on right now and does not want to be "tied down" with a machine while he is sleeping. He is taking a diuretic and gets up several times per night to use the restroom and he subsequently has to hook up the auto-pap machine again. Pt says that he will consider the cpap titration study but wants to discuss it further at the office visit on 10/13/17 with Dr. Frances Furbish. I offered pt a sooner appt on tomorrow, but pt declined. Pt verbalized understanding of the recommendations on treating his sleep apnea and the possible risks and ramifications of untreated sleep apnea.

## 2017-09-22 NOTE — Telephone Encounter (Signed)
Please call pt: For moderate to severe OSA, CPAP is the best treatment option. Please advise patient that he could potentially qualify for a in lab titration study as his insurance only allowed him to be on AutoPap. We can request a titration study based on difficulty tolerating AutoPap. Leaving his sleep apnea untreated is unfortunately going to increase his risk for cardiovascular complications including going into A. fib. He has a history of A. fib and congestive heart failure. I would like to at least offer him a CPAP titration study which we can submit to insurance for authorization.

## 2017-10-04 ENCOUNTER — Telehealth: Payer: Self-pay | Admitting: Neurology

## 2017-10-04 NOTE — Telephone Encounter (Signed)
I called pt and pt's wife to discuss, no answer at either number, left a message asking them to call me back.

## 2017-10-04 NOTE — Telephone Encounter (Signed)
Please offer FU appt to discuss alternative treatment options for sleep apnea.  He has moderate to severe OSA and should not remain untreated.

## 2017-10-04 NOTE — Telephone Encounter (Signed)
Pt's wife calling the pt was not able to tolerate the auto pap. She said he tried it in the bed but he felt constricted, tried it sitting in his recliner in the living room, he would use it for about 1/2 hour and the take it off.He turned it back in the 1st of January.  Appt c/a for 10/13/17.  FYI

## 2017-10-05 ENCOUNTER — Encounter: Payer: Self-pay | Admitting: Family Medicine

## 2017-10-05 ENCOUNTER — Ambulatory Visit: Payer: BLUE CROSS/BLUE SHIELD | Admitting: Family Medicine

## 2017-10-05 VITALS — BP 122/80 | HR 80 | Temp 98.4°F | Resp 20 | Ht 73.0 in | Wt 310.0 lb

## 2017-10-05 DIAGNOSIS — J439 Emphysema, unspecified: Secondary | ICD-10-CM | POA: Diagnosis not present

## 2017-10-05 DIAGNOSIS — I480 Paroxysmal atrial fibrillation: Secondary | ICD-10-CM | POA: Diagnosis not present

## 2017-10-05 DIAGNOSIS — R0609 Other forms of dyspnea: Secondary | ICD-10-CM

## 2017-10-05 DIAGNOSIS — R0600 Dyspnea, unspecified: Secondary | ICD-10-CM

## 2017-10-05 NOTE — Progress Notes (Signed)
Subjective:    Patient ID: William Alvarado, male    DOB: 1951-06-04, 67 y.o.   MRN: [id number]  Medication Refill     03/2017 Patient was recently admitted to the hospital with community-acquired pneumonia, altered mental status secondary to hypoxia, pulmonary embolism, and acute on chronic diastolic heart failure with fluid overload. I have copied relevant portions of the discharge summary and included them below for my reference:  Admit date: 03/10/2017 Discharge date: 03/15/2017  Primary Care Physician:  [name], MD  Discharge Diagnoses:       Acute hypoxic respiratory failure   Multifocal pneumonia   Acute pulmonary embolism   COPD exacerbation  Acute on chronic diastolic CHF  Chronic Atrial fibrillation  . Sepsis ([place]) . Hypertension . Systolic murmur . Mitral regurgitation   Diabetes mellitus   Hypokalemia  Brief H and P: For complete details please refer to admission H and P, but in brief Patient is a 67 year old male with chronic atrial fibrillation, COPD, DJD, diabetes, hypertension, ? Diastolic CHF, pedal edema on Lasix brought to ED after he became confused at the physician's office around 10 AM in the morning, having acute onset of shortness of breath, hypoxic with O2 sat in low 80s. Reported fevers, chills or night sweats, chronic lower activity swelling and had not taken his diuretic. WBC count 18.4, lactic acid 2.89. Chest x-ray showed cardiomegaly with mild pulmonary vascular congestion, right base infiltrate suspicious for pneumonia. Patient was admitted and eventually placed on BiPAP in the stepdown unit.   Hospital Course:   Acute respiratory failure with hypoxia (HCC) secondary to COPD, acute on chronic diastolic CHF,Multifocal pneumonia -On admission WBC count elevated at 18, chest x-ray with vascular condition and right base infiltrate. Initially placed on Venti mask, then BiPAP - Currently Improving, home O2 evaluation done, needs 2 L O2  on ambulation - CTA chest showed multifocal pneumonia, placed on IV vancomycin and cefepime on 6/22, transitioned to oral Levaquin for a week  - The patient will need a repeat chest x-ray or a CT chest in 2-3 weeks to ensure complete resolution of multifocal pneumonia   Acute pulmonary embolism - Due to persistent hypoxia, CTA chest was done. CT angiogram of the chest showed small nonocclusive pulmonary embolism in the segmental and subsegmental pulmonary arteries to the lingula (patient is on warfarin prior to admission hence a warfarin failure) - Patient was placed on IV heparin. Discussed in detail with patient and his wife about warfarin failure and likely need to be on NOAC's, discussed about risks and benefits. Patient and his wife agreeable with NOAC's - After receiving copays from the case management, patient was started on xarelto.    Acute on chronic diastolic CHF - Prior 2-D echo in 09/2015 had shown EF of 60-65%, however chest x-ray with vascular congestion, on high-dose Lasix at home with retaining fluid -Improving now with negative balance of 4.7 L, weight down from 298 on 6/21 ->281 lbs at the time of discharge - continue Lasix 80 mg twice a day, received metolazone 6/22 - 2-D echo showed EF of 55% - Patient is in a high-dose Lasix and metolazone and has not been following with cardiology outpatient. I requested cardiology to follow outpatient, appointment scheduled on 03/18/17 with Dr. Stanford Breed.   Sepsis due to the pneumonia - Patient met sepsis criteria at the time of admission given tachycardia, tachypnea, fever, hypotension, leukocytosis. Tmax in ED 103.51F - Sepsis physiology improved  Atrial fibrillation, chronic  -  Rate controlled, continue metoprolol - Warfarin discontinued as patient had pulmonary embolism with therapeutic INR - Continue xarelto, [place] vasc score 4    Hypertension - BP currently improving, placed on low-dose metoprolol, continue Lasix and  metolazone  Diabetes mellitus  - Continue metformin, hemoglobin A1c 7.2 - Outpatient follow-up with PCP  Hypokalemia - Continue scheduled potassium replacement   He is here today for follow-up. Overall he states that his breathing is better. He would like to discontinue oxygen. He has chronic venous stasis changes in both legs but there is no pitting edema. There is no weeping ulcer as it is essentially healed on his anterior right shin. There are no pulmonary crackles on exam. He does not appear to be fluid overloaded weight according to our scales is 297 lbs.  Antibiotics have helped the shortness of breath. He denies any fevers or chills. He has quit smoking which I congratulated him on. On his exam, this is the best his lungs have ever sounded. There are no expiratory wheezes. He is in nature fibrillation this morning but he is rate controlled. He is not wearing his compression hose however he is being compliant with his diuretic therapy. He was diagnosed with a pulmonary embolism despite being on an adequate dose of Coumadin. Therefore he was switched to xarelto 15 mg pobid for 3 weeks then 20 mg poqday thereafter.  He is being compliant with this. He denies any bleeding or bruising. At that time, my plan was: Regarding his pneumonia, he needs to have a chest xray scheduled for 3 weeks. I will get this arranged. Clinically however he has no evidence of pneumonia on his exam today. Regarding his pulmonary embolism, he is on appropriate anticoagulant. He will be switching to 20 mg a day saying. I will check a CBC to rule out any acute anemia or evidence of bleeding. Regarding his COPD, he is quit smoking which is the mainstay of therapy. His exam today is unremarkable. This is the best his lungs sounded in years. I will continue him on oxygen for the next month and then if he is no longer hypoxic I will discontinue it at that point. Regarding his chronic diastolic heart failure, I believe is due to  a combination of obesity, COPD, smoking. I also believe that he likely has obstructive sleep apnea. I have mentioned this the patient on numerous occasions. He is now finally willing to allow me to schedule him for a sleep study to evaluate further to that we can adequately treat him  if present.  04/29/17 Patient has quit smoking. I congratulated him. His breathing is much better. He is 98% on room air. I ambulated with the patient for more than 200 feet and his oxygen saturations remained above 96%. There is no additional wheezing. He is wearing compression hose. The swelling in his legs is under control. There is no pitting edema in his legs. There is no venous stasis ulcers weeping edema coming from his legs. He denies any chest pain. He does report dyspnea on exertion consistent with deconditioning. He is not getting any exercise. He is relatively sedentary due to pain in his back knees and feet.  At that time, my plan was: Clinically the patient has improved dramatically. He is back at his baseline. He can discontinue oxygen at this point. He is scheduled for the sleep study later this month. He may benefit from CPAP. Congratulated the patient on quitting smoking. I encouraged him to get 30 minutes a  day 5 days a week of aerobic exercise. He will need to build on this gradually. I recommended water aerobics and gradually increasing his walking and exercise in the pool until he can do it for 30 minutes and then begin to exercise more vigorously outside the pool. Patient is due to recheck hemoglobin A1c fasting lipid panel and fasting lab work in 3 months.  07/30/17 Patient is here today to recheck his diabetes.  He denies any polyuria, polydipsia, or blurry vision.  He denies any neuropathy in his feet.  He is due for a diabetic eye exam and I recommended he schedule that at.  Diabetic foot exam is performed today and reveals mild decreased sensation to 10 g monofilament on the ball of his right foot.  He  is also due for urine microalbumin.  Regarding his chronic diastolic heart failure, his weight is stable.  There is no pitting edema in his legs.  Since I last saw him, he has quit smoking and I am extremely proud of him for this.  He has not had any recent COPD exacerbations since his last hospitalization.  He also recently went for a sleep study and was diagnosed with obstructive sleep apnea.  He is now compliant wearing his CPAP machine for the last week and does feel better.  He is due for fasting lab work.  Regarding his atrial fibrillation, he is in normal sinus rhythm today but he is still appropriately anticoagulated with Xarelto.  I will discontinue/cancel his INR order.  At that time, my plan was: Recommended diabetic eye exam.  Diabetic foot exam reveals mildly diminished sensation in the right foot.  I will check a hemoglobin A1c.  I will check a urine microalbumin.  I will check a fasting lipid panel.  Patient is no longer on Coumadin and I will canceled INR order placed by my nurse.  Continue to encourage aerobic exercise and weight loss.  Blood pressure today is excellent.  10/05/17 Since I last saw the patient, he has gained approximately 12 pounds.  He reports increasing dyspnea on exertion.  However there is no peripheral edema on his exam today.  Furthermore there are no crackles on his pulmonary exam.  Clinically the patient appears euvolemic with no evidence of fluid overload.  He does have faint expiratory wheezes and diminished breath sounds.  Patient has a history of COPD and has had frequent COPD exacerbations in the past.  He is currently not on a maintenance inhaler as his breathing had improved after smoking cessation.  He denies any chest pain.  He denies any orthopnea.  He denies any paroxysmal nocturnal dyspnea.  On exam today, he has an irregularly irregular cardiac rhythm suggesting recurrence of atrial fibrillation.  However EKG does in fact confirm P waves and demonstrates that  the patient is in normal sinus rhythm with frequent PACs.  There is no evidence of ischemia or infarction. Past Medical History:  Diagnosis Date  . Acute respiratory failure with hypoxia (HCC)    Hospitalized with acute respiratory failure secondary to CAP, diastolic CHF, and pulmonary embolism  . AKI (acute kidney injury) ([place])   . Allergy    allergic rhinitis  . Altered mental status   . Atrial fibrillation ([place])   . Chronic anticoagulation 03/26/2017   Failed Coumadin (PE with therapeutic INR). Now on Xarelto  . Colon polyps   . Community acquired pneumonia of right lower lobe of lung ([place])   . COPD (chronic obstructive pulmonary disease) (  [place])   . Degenerative disc disease, lumbar   . Diabetes mellitus without complication ([place])   . History of pulmonary embolus (PE) 03/26/2017  . Hypertension   . Mitral regurgitation 12/26/2012  . Obesity (BMI 30-39.9) 12/09/2012  . PAF (paroxysmal atrial fibrillation) ([place])    DCCV in 2014 with recurrent PAF in setting of respiratory failure June 2018. S/P DCCV 04/13/17 to NSR  . Persistent atrial fibrillation ([place])   . Pneumonia ~ 2016  . Sepsis ([place]) 03/10/2017  . Sleep apnea   . Systolic murmur   . Type II diabetes mellitus ([place])    Past Surgical History:  Procedure Laterality Date  . CARDIOVERSION N/A 01/09/2013   Procedure: CARDIOVERSION;  Surgeon: Fay Records, MD;  Location: [place];  Service: Cardiovascular;  Laterality: N/A;  . CARDIOVERSION N/A 04/13/2017   Procedure: CARDIOVERSION;  Surgeon: Sueanne Margarita, MD;  Location: [facility] ENDOSCOPY;  Service: Cardiovascular;  Laterality: N/A;  . TEE WITHOUT CARDIOVERSION N/A 01/09/2013   Procedure: TRANSESOPHAGEAL ECHOCARDIOGRAM (TEE);  Surgeon: Fay Records, MD;  Location: [facility] ENDOSCOPY;  Service: Cardiovascular;  Laterality: N/A;   Current Outpatient Medications on File Prior to Visit  Medication Sig Dispense Refill  . fish oil-omega-3 fatty acids 1000 MG capsule Take 1 g by mouth daily.    .  furosemide (LASIX) 40 MG tablet Take 2 tablets (80 mg total) by mouth 2 (two) times daily. (Patient taking differently: Take 40 mg 2 (two) times daily by mouth. ) 180 tablet 3  . metFORMIN (GLUCOPHAGE) 1000 MG tablet TAKE 1 TABLET BY MOUTH TWICE DAILY 180 tablet 1  . metolazone (ZAROXOLYN) 5 MG tablet Take 1 pill 30 minutes before lasix on Monday only 30 tablet 0  . metoprolol tartrate (LOPRESSOR) 50 MG tablet Take 1 tablet (50 mg total) by mouth 2 (two) times daily. 60 tablet 11  . potassium chloride SA (K-DUR,KLOR-CON) 20 MEQ tablet Take 2 tablets (40 mEq total) by mouth 2 (two) times daily. 360 tablet 3  . rivaroxaban (XARELTO) 20 MG TABS tablet Take 1 tablet (20 mg total) by mouth daily with supper. 90 tablet 3  . sitaGLIPtin (JANUVIA) 100 MG tablet Take 1 tablet (100 mg total) daily by mouth. 30 tablet 3   No current facility-administered medications on file prior to visit.    No Known Allergies Social History   Socioeconomic History  . Marital status: Married    Spouse name: Not on file  . Number of children: 2  . Years of education: Not on file  . Highest education level: Not on file  Social Needs  . Financial resource strain: Not on file  . Food insecurity - worry: Not on file  . Food insecurity - inability: Not on file  . Transportation needs - medical: Not on file  . Transportation needs - non-medical: Not on file  Occupational History    Comment: Machinist  Tobacco Use  . Smoking status: Former Smoker    Packs/day: 1.00    Years: 43.00    Pack years: 43.00    Types: Cigarettes    Last attempt to quit: 03/08/2017    Years since quitting: 0.5  . Smokeless tobacco: Never Used  Substance and Sexual Activity  . Alcohol use: No    Comment: Rare  . Drug use: No  . Sexual activity: Not Currently  Other Topics Concern  . Not on file  Social History Narrative  . Not on file      Review of Systems  All other systems reviewed and are negative.      Objective:    Physical Exam  Constitutional: He appears well-developed and well-nourished. No distress.  Neck: Neck supple. No JVD present.  Cardiovascular: Normal rate. An irregularly irregular rhythm present.  Pulmonary/Chest: Effort normal. He has decreased breath sounds. He has wheezes. He has no rales.  Abdominal: Soft. Bowel sounds are normal.  Musculoskeletal: He exhibits no edema.  Skin: No rash noted. He is not diaphoretic. No erythema.          Assessment & Plan:  Dyspnea on exertion - Plan: EKG 12-Lead, CBC with Differential/Platelet, COMPLETE METABOLIC PANEL WITH GFR, Brain natriuretic peptide  PAF (paroxysmal atrial fibrillation) (HCC) - Plan: EKG 12-Lead  Pulmonary emphysema, unspecified emphysema type ([place])  I believe his dyspnea on exertion is likely related to morbid obesity, deconditioning, and underlying COPD.  EKG today shows no recurrence of his atrial fibrillation.  There is no evidence of pulmonary edema on exam of fluid overload on exam.  I will check a CBC to rule out anemia.  I will check a BNP to rule out evidence of fluid overload.  I will start the patient on anoro 1 inhalation a day and reassess the patient in 1 month to see if he is experiencing improvement.  EKG today is reassuring.

## 2017-10-05 NOTE — Telephone Encounter (Signed)
I called pt's wife Eunice Blase, per DPR. I advised her that Dr. Frances Furbish recommends a follow up to discuss alternative tx options for sleep apnea to treat his moderate to severe osa and this should not remain untreated. Pt's wife is agreeable to a follow up appt with Dr. Frances Furbish to discuss treatment options. An appt was made for 10/28/17 at 9:30am. Pt's wife asked me to mail them a letter with this appt date and time.

## 2017-10-06 ENCOUNTER — Telehealth: Payer: Self-pay | Admitting: Family Medicine

## 2017-10-06 LAB — CBC WITH DIFFERENTIAL/PLATELET
Basophils Absolute: 86 cells/uL (ref 0–200)
Basophils Relative: 0.8 %
EOS ABS: 300 {cells}/uL (ref 15–500)
Eosinophils Relative: 2.8 %
HEMATOCRIT: 42.9 % (ref 38.5–50.0)
HEMOGLOBIN: 14.5 g/dL (ref 13.2–17.1)
LYMPHS ABS: 2054 {cells}/uL (ref 850–3900)
MCH: 30.7 pg (ref 27.0–33.0)
MCHC: 33.8 g/dL (ref 32.0–36.0)
MCV: 90.7 fL (ref 80.0–100.0)
MPV: 11.5 fL (ref 7.5–12.5)
Monocytes Relative: 9.5 %
Neutro Abs: 7244 cells/uL (ref 1500–7800)
Neutrophils Relative %: 67.7 %
PLATELETS: 302 10*3/uL (ref 140–400)
RBC: 4.73 10*6/uL (ref 4.20–5.80)
RDW: 13.5 % (ref 11.0–15.0)
TOTAL LYMPHOCYTE: 19.2 %
WBC: 10.7 10*3/uL (ref 3.8–10.8)
WBCMIX: 1017 {cells}/uL — AB (ref 200–950)

## 2017-10-06 LAB — COMPLETE METABOLIC PANEL WITH GFR
AG RATIO: 1.3 (calc) (ref 1.0–2.5)
ALBUMIN MSPROF: 4.4 g/dL (ref 3.6–5.1)
ALT: 30 U/L (ref 9–46)
AST: 29 U/L (ref 10–35)
Alkaline phosphatase (APISO): 69 U/L (ref 40–115)
BUN / CREAT RATIO: 21 (calc) (ref 6–22)
BUN: 33 mg/dL — ABNORMAL HIGH (ref 7–25)
CALCIUM: 10.1 mg/dL (ref 8.6–10.3)
CO2: 34 mmol/L — ABNORMAL HIGH (ref 20–32)
Chloride: 90 mmol/L — ABNORMAL LOW (ref 98–110)
Creat: 1.56 mg/dL — ABNORMAL HIGH (ref 0.70–1.25)
GFR, EST AFRICAN AMERICAN: 54 mL/min/{1.73_m2} — AB (ref >=60)
GFR, EST NON AFRICAN AMERICAN: 46 mL/min/{1.73_m2} — AB (ref >=60)
GLOBULIN: 3.5 g/dL (ref 1.9–3.7)
Glucose, Bld: 151 mg/dL — ABNORMAL HIGH (ref 65–99)
Potassium: 4 mmol/L (ref 3.5–5.3)
SODIUM: 135 mmol/L (ref 135–146)
TOTAL PROTEIN: 7.9 g/dL (ref 6.1–8.1)
Total Bilirubin: 0.4 mg/dL (ref 0.2–1.2)

## 2017-10-06 LAB — BRAIN NATRIURETIC PEPTIDE: Brain Natriuretic Peptide: 216 pg/mL — ABNORMAL HIGH (ref <100)

## 2017-10-06 NOTE — Telephone Encounter (Signed)
Patient is calling to talk to you regarding her husbands diagnosis yesterday, would like a call tomorrow when you have time  [phone]

## 2017-10-07 NOTE — Telephone Encounter (Signed)
Pt's wife aware of below and recommendations

## 2017-10-07 NOTE — Telephone Encounter (Signed)
Patient's EKG did not show A fib.  The irregular heart beat I heard was frequent PAC's.  Nothing bad.  I think his sob is weight related and copd and I want to try the inhaler anoro once a day for 1 month to see if it helps.  No need to contact his cardiologist at the present time.

## 2017-10-11 ENCOUNTER — Other Ambulatory Visit: Payer: Self-pay | Admitting: Family Medicine

## 2017-10-11 DIAGNOSIS — R0602 Shortness of breath: Secondary | ICD-10-CM

## 2017-10-13 ENCOUNTER — Ambulatory Visit: Payer: Self-pay | Admitting: Neurology

## 2017-10-15 ENCOUNTER — Ambulatory Visit
Admission: RE | Admit: 2017-10-15 | Discharge: 2017-10-15 | Disposition: A | Discharge status: Home / Self Care | Payer: BLUE CROSS/BLUE SHIELD | Source: Ambulatory Visit | Attending: Family Medicine | Admitting: Family Medicine

## 2017-10-15 DIAGNOSIS — R0602 Shortness of breath: Secondary | ICD-10-CM

## 2017-10-28 ENCOUNTER — Ambulatory Visit: Payer: BLUE CROSS/BLUE SHIELD | Admitting: Family Medicine

## 2017-10-28 ENCOUNTER — Ambulatory Visit: Payer: Self-pay | Admitting: Neurology

## 2017-11-05 ENCOUNTER — Encounter: Payer: Self-pay | Admitting: Family Medicine

## 2017-11-05 ENCOUNTER — Ambulatory Visit: Payer: BLUE CROSS/BLUE SHIELD | Admitting: Family Medicine

## 2017-11-05 VITALS — BP 130/90 | HR 86 | Temp 97.8°F | Resp 22 | Ht 73.0 in | Wt 316.0 lb

## 2017-11-05 DIAGNOSIS — R0600 Dyspnea, unspecified: Secondary | ICD-10-CM

## 2017-11-05 DIAGNOSIS — R0609 Other forms of dyspnea: Secondary | ICD-10-CM

## 2017-11-05 DIAGNOSIS — I509 Heart failure, unspecified: Secondary | ICD-10-CM | POA: Diagnosis not present

## 2017-11-05 NOTE — Progress Notes (Signed)
Subjective:    Patient ID: William Alvarado, male    DOB: 1951-06-04, 67 y.o.   MRN: [id number]  Medication Refill     03/2017 Patient was recently admitted to the hospital with community-acquired pneumonia, altered mental status secondary to hypoxia, pulmonary embolism, and acute on chronic diastolic heart failure with fluid overload. I have copied relevant portions of the discharge summary and included them below for my reference:  Admit date: 03/10/2017 Discharge date: 03/15/2017  Primary Care Physician:  [name], MD  Discharge Diagnoses:       Acute hypoxic respiratory failure   Multifocal pneumonia   Acute pulmonary embolism   COPD exacerbation  Acute on chronic diastolic CHF  Chronic Atrial fibrillation  . Sepsis ([place]) . Hypertension . Systolic murmur . Mitral regurgitation   Diabetes mellitus   Hypokalemia  Brief H and P: For complete details please refer to admission H and P, but in brief Patient is a 67 year old male with chronic atrial fibrillation, COPD, DJD, diabetes, hypertension, ? Diastolic CHF, pedal edema on Lasix brought to ED after he became confused at the physician's office around 10 AM in the morning, having acute onset of shortness of breath, hypoxic with O2 sat in low 80s. Reported fevers, chills or night sweats, chronic lower activity swelling and had not taken his diuretic. WBC count 18.4, lactic acid 2.89. Chest x-ray showed cardiomegaly with mild pulmonary vascular congestion, right base infiltrate suspicious for pneumonia. Patient was admitted and eventually placed on BiPAP in the stepdown unit.   Hospital Course:   Acute respiratory failure with hypoxia (HCC) secondary to COPD, acute on chronic diastolic CHF,Multifocal pneumonia -On admission WBC count elevated at 18, chest x-ray with vascular condition and right base infiltrate. Initially placed on Venti mask, then BiPAP - Currently Improving, home O2 evaluation done, needs 2 L O2  on ambulation - CTA chest showed multifocal pneumonia, placed on IV vancomycin and cefepime on 6/22, transitioned to oral Levaquin for a week  - The patient will need a repeat chest x-ray or a CT chest in 2-3 weeks to ensure complete resolution of multifocal pneumonia   Acute pulmonary embolism - Due to persistent hypoxia, CTA chest was done. CT angiogram of the chest showed small nonocclusive pulmonary embolism in the segmental and subsegmental pulmonary arteries to the lingula (patient is on warfarin prior to admission hence a warfarin failure) - Patient was placed on IV heparin. Discussed in detail with patient and his wife about warfarin failure and likely need to be on NOAC's, discussed about risks and benefits. Patient and his wife agreeable with NOAC's - After receiving copays from the case management, patient was started on xarelto.    Acute on chronic diastolic CHF - Prior 2-D echo in 09/2015 had shown EF of 60-65%, however chest x-ray with vascular congestion, on high-dose Lasix at home with retaining fluid -Improving now with negative balance of 4.7 L, weight down from 298 on 6/21 ->281 lbs at the time of discharge - continue Lasix 80 mg twice a day, received metolazone 6/22 - 2-D echo showed EF of 55% - Patient is in a high-dose Lasix and metolazone and has not been following with cardiology outpatient. I requested cardiology to follow outpatient, appointment scheduled on 03/18/17 with Dr. Stanford Breed.   Sepsis due to the pneumonia - Patient met sepsis criteria at the time of admission given tachycardia, tachypnea, fever, hypotension, leukocytosis. Tmax in ED 103.51F - Sepsis physiology improved  Atrial fibrillation, chronic  -  Rate controlled, continue metoprolol - Warfarin discontinued as patient had pulmonary embolism with therapeutic INR - Continue xarelto, [place] vasc score 4    Hypertension - BP currently improving, placed on low-dose metoprolol, continue Lasix and  metolazone  Diabetes mellitus  - Continue metformin, hemoglobin A1c 7.2 - Outpatient follow-up with PCP  Hypokalemia - Continue scheduled potassium replacement   He is here today for follow-up. Overall he states that his breathing is better. He would like to discontinue oxygen. He has chronic venous stasis changes in both legs but there is no pitting edema. There is no weeping ulcer as it is essentially healed on his anterior right shin. There are no pulmonary crackles on exam. He does not appear to be fluid overloaded weight according to our scales is 297 lbs.  Antibiotics have helped the shortness of breath. He denies any fevers or chills. He has quit smoking which I congratulated him on. On his exam, this is the best his lungs have ever sounded. There are no expiratory wheezes. He is in nature fibrillation this morning but he is rate controlled. He is not wearing his compression hose however he is being compliant with his diuretic therapy. He was diagnosed with a pulmonary embolism despite being on an adequate dose of Coumadin. Therefore he was switched to xarelto 15 mg pobid for 3 weeks then 20 mg poqday thereafter.  He is being compliant with this. He denies any bleeding or bruising. At that time, my plan was: Regarding his pneumonia, he needs to have a chest xray scheduled for 3 weeks. I will get this arranged. Clinically however he has no evidence of pneumonia on his exam today. Regarding his pulmonary embolism, he is on appropriate anticoagulant. He will be switching to 20 mg a day saying. I will check a CBC to rule out any acute anemia or evidence of bleeding. Regarding his COPD, he is quit smoking which is the mainstay of therapy. His exam today is unremarkable. This is the best his lungs sounded in years. I will continue him on oxygen for the next month and then if he is no longer hypoxic I will discontinue it at that point. Regarding his chronic diastolic heart failure, I believe is due to  a combination of obesity, COPD, smoking. I also believe that he likely has obstructive sleep apnea. I have mentioned this the patient on numerous occasions. He is now finally willing to allow me to schedule him for a sleep study to evaluate further to that we can adequately treat him  if present.  04/29/17 Patient has quit smoking. I congratulated him. His breathing is much better. He is 98% on room air. I ambulated with the patient for more than 200 feet and his oxygen saturations remained above 96%. There is no additional wheezing. He is wearing compression hose. The swelling in his legs is under control. There is no pitting edema in his legs. There is no venous stasis ulcers weeping edema coming from his legs. He denies any chest pain. He does report dyspnea on exertion consistent with deconditioning. He is not getting any exercise. He is relatively sedentary due to pain in his back knees and feet.  At that time, my plan was: Clinically the patient has improved dramatically. He is back at his baseline. He can discontinue oxygen at this point. He is scheduled for the sleep study later this month. He may benefit from CPAP. Congratulated the patient on quitting smoking. I encouraged him to get 30 minutes a  day 5 days a week of aerobic exercise. He will need to build on this gradually. I recommended water aerobics and gradually increasing his walking and exercise in the pool until he can do it for 30 minutes and then begin to exercise more vigorously outside the pool. Patient is due to recheck hemoglobin A1c fasting lipid panel and fasting lab work in 3 months.  07/30/17 Patient is here today to recheck his diabetes.  He denies any polyuria, polydipsia, or blurry vision.  He denies any neuropathy in his feet.  He is due for a diabetic eye exam and I recommended he schedule that at.  Diabetic foot exam is performed today and reveals mild decreased sensation to 10 g monofilament on the ball of his right foot.  He  is also due for urine microalbumin.  Regarding his chronic diastolic heart failure, his weight is stable.  There is no pitting edema in his legs.  Since I last saw him, he has quit smoking and I am extremely proud of him for this.  He has not had any recent COPD exacerbations since his last hospitalization.  He also recently went for a sleep study and was diagnosed with obstructive sleep apnea.  He is now compliant wearing his CPAP machine for the last week and does feel better.  He is due for fasting lab work.  Regarding his atrial fibrillation, he is in normal sinus rhythm today but he is still appropriately anticoagulated with Xarelto.  I will discontinue/cancel his INR order.  At that time, my plan was: Recommended diabetic eye exam.  Diabetic foot exam reveals mildly diminished sensation in the right foot.  I will check a hemoglobin A1c.  I will check a urine microalbumin.  I will check a fasting lipid panel.  Patient is no longer on Coumadin and I will canceled INR order placed by my nurse.  Continue to encourage aerobic exercise and weight loss.  Blood pressure today is excellent.  10/05/17 Since I last saw the patient, he has gained approximately 12 pounds.  He reports increasing dyspnea on exertion.  However there is no peripheral edema on his exam today.  Furthermore there are no crackles on his pulmonary exam.  Clinically the patient appears euvolemic with no evidence of fluid overload.  He does have faint expiratory wheezes and diminished breath sounds.  Patient has a history of COPD and has had frequent COPD exacerbations in the past.  He is currently not on a maintenance inhaler as his breathing had improved after smoking cessation.  He denies any chest pain.  He denies any orthopnea.  He denies any paroxysmal nocturnal dyspnea.  On exam today, he has an irregularly irregular cardiac rhythm suggesting recurrence of atrial fibrillation.  However EKG does in fact confirm P waves and demonstrates that  the patient is in normal sinus rhythm with frequent PACs.  There is no evidence of ischemia or infarction.  At that time, my plan was: I believe his dyspnea on exertion is likely related to morbid obesity, deconditioning, and underlying COPD.  EKG today shows no recurrence of his atrial fibrillation.  There is no evidence of pulmonary edema on exam of fluid overload on exam.  I will check a CBC to rule out anemia.  I will check a BNP to rule out evidence of fluid overload.  I will start the patient on anoro 1 inhalation a day and reassess the patient in 1 month to see if he is experiencing improvement.  EKG  today is reassuring.  11/05/17 At the patient's last visit, his BNP was over 200.  He has seen no benefit since trying anoro.  He is also gained 6 pounds since his last visit.  Altogether since last summer when he was hospitalized with heart failure, he is gradually gained 27 pounds.  He continues to report dyspnea on exertion.  He denies orthopnea or paroxysmal nocturnal dyspnea.  He denies any chest pain. Past Medical History:  Diagnosis Date  . Acute respiratory failure with hypoxia (HCC)    Hospitalized with acute respiratory failure secondary to CAP, diastolic CHF, and pulmonary embolism  . AKI (acute kidney injury) ([place])   . Allergy    allergic rhinitis  . Altered mental status   . Atrial fibrillation ([place])   . Chronic anticoagulation 03/26/2017   Failed Coumadin (PE with therapeutic INR). Now on Xarelto  . Colon polyps   . Community acquired pneumonia of right lower lobe of lung ([place])   . COPD (chronic obstructive pulmonary disease) ([place])   . Degenerative disc disease, lumbar   . Diabetes mellitus without complication ([place])   . History of pulmonary embolus ([facility]) 03/26/2017  . Hypertension   . Mitral regurgitation 12/26/2012  . Obesity (BMI 30-39.9) 12/09/2012  . PAF (paroxysmal atrial fibrillation) ([place])    DCCV in 2014 with recurrent PAF in setting of respiratory failure June 2018. S/P  DCCV 04/13/17 to NSR  . Persistent atrial fibrillation ([place])   . Pneumonia ~ 2016  . Sepsis ([place]) 03/10/2017  . Sleep apnea   . Systolic murmur   . Type II diabetes mellitus ([place])    Past Surgical History:  Procedure Laterality Date  . CARDIOVERSION N/A 01/09/2013   Procedure: CARDIOVERSION;  Surgeon: Fay Records, MD;  Location: [facility];  Service: Cardiovascular;  Laterality: N/A;  . CARDIOVERSION N/A 04/13/2017   Procedure: CARDIOVERSION;  Surgeon: Sueanne Margarita, MD;  Location: [facility] ENDOSCOPY;  Service: Cardiovascular;  Laterality: N/A;  . TEE WITHOUT CARDIOVERSION N/A 01/09/2013   Procedure: TRANSESOPHAGEAL ECHOCARDIOGRAM (TEE);  Surgeon: Fay Records, MD;  Location: [facility] ENDOSCOPY;  Service: Cardiovascular;  Laterality: N/A;   Current Outpatient Medications on File Prior to Visit  Medication Sig Dispense Refill  . fish oil-omega-3 fatty acids 1000 MG capsule Take 1 g by mouth daily.    . furosemide (LASIX) 40 MG tablet Take 2 tablets (80 mg total) by mouth 2 (two) times daily. (Patient taking differently: Take 40 mg 2 (two) times daily by mouth. ) 180 tablet 3  . metFORMIN (GLUCOPHAGE) 1000 MG tablet TAKE 1 TABLET BY MOUTH TWICE DAILY 180 tablet 1  . metolazone (ZAROXOLYN) 5 MG tablet Take 1 pill 30 minutes before lasix on Monday only 30 tablet 0  . metoprolol tartrate (LOPRESSOR) 50 MG tablet Take 1 tablet (50 mg total) by mouth 2 (two) times daily. 60 tablet 11  . potassium chloride SA (K-DUR,KLOR-CON) 20 MEQ tablet Take 2 tablets (40 mEq total) by mouth 2 (two) times daily. 360 tablet 3  . rivaroxaban (XARELTO) 20 MG TABS tablet Take 1 tablet (20 mg total) by mouth daily with supper. 90 tablet 3  . sitaGLIPtin (JANUVIA) 100 MG tablet Take 1 tablet (100 mg total) daily by mouth. 30 tablet 3  . umeclidinium-vilanterol (ANORO ELLIPTA) 62.5-25 MCG/INH AEPB Inhale 1 puff into the lungs daily.     No current facility-administered medications on file prior to visit.    No Known  Allergies Social History   Socioeconomic History  .  Marital status: Married    Spouse name: Not on file  . Number of children: 2  . Years of education: Not on file  . Highest education level: Not on file  Social Needs  . Financial resource strain: Not on file  . Food insecurity - worry: Not on file  . Food insecurity - inability: Not on file  . Transportation needs - medical: Not on file  . Transportation needs - non-medical: Not on file  Occupational History    Comment: Machinist  Tobacco Use  . Smoking status: Former Smoker    Packs/day: 1.00    Years: 43.00    Pack years: 43.00    Types: Cigarettes    Last attempt to quit: 03/08/2017    Years since quitting: 0.6  . Smokeless tobacco: Never Used  Substance and Sexual Activity  . Alcohol use: No    Comment: Rare  . Drug use: No  . Sexual activity: Not Currently  Other Topics Concern  . Not on file  Social History Narrative  . Not on file      Review of Systems  All other systems reviewed and are negative.      Objective:   Physical Exam  Constitutional: He appears well-developed and well-nourished. No distress.  Neck: Neck supple. No JVD present.  Cardiovascular: Normal rate. An irregularly irregular rhythm present.  Pulmonary/Chest: Effort normal. He has decreased breath sounds. He has wheezes. He has rhonchi. He has no rales.  Abdominal: Soft. Bowel sounds are normal.  Musculoskeletal: He exhibits no edema.  Skin: No rash noted. He is not diaphoretic. No erythema.          Assessment & Plan:  DOE/CHF  Treatment for COPD has not helped his dyspnea on exertion.  I believe I was incorrect at his last visit.  I believe that with his gradual weight gain, the patient is retaining fluid and he is experiencing dyspnea on exertion due to that.  I recommended he increase his Lasix.  He is currently taking 40 mg twice daily.  I recommended increasing to 80 mg twice daily and rechecking in 1 week to monitor his  weight as well as his kidney function and potassium.  Reassess in 1 week or sooner if worse

## 2017-11-11 ENCOUNTER — Ambulatory Visit: Payer: BLUE CROSS/BLUE SHIELD | Admitting: Family Medicine

## 2017-11-11 ENCOUNTER — Encounter: Payer: Self-pay | Admitting: Family Medicine

## 2017-11-11 VITALS — BP 130/80 | HR 74 | Temp 98.0°F | Resp 20 | Ht 73.0 in | Wt 307.0 lb

## 2017-11-11 DIAGNOSIS — R0609 Other forms of dyspnea: Secondary | ICD-10-CM

## 2017-11-11 DIAGNOSIS — I509 Heart failure, unspecified: Secondary | ICD-10-CM | POA: Diagnosis not present

## 2017-11-11 DIAGNOSIS — E119 Type 2 diabetes mellitus without complications: Secondary | ICD-10-CM

## 2017-11-11 DIAGNOSIS — R0600 Dyspnea, unspecified: Secondary | ICD-10-CM

## 2017-11-11 NOTE — Progress Notes (Signed)
Subjective:    Patient ID: William Alvarado, male    DOB: 1951-06-04, 67 y.o.   MRN: [id number]  Medication Refill     03/2017 Patient was recently admitted to the hospital with community-acquired pneumonia, altered mental status secondary to hypoxia, pulmonary embolism, and acute on chronic diastolic heart failure with fluid overload. I have copied relevant portions of the discharge summary and included them below for my reference:  Admit date: 03/10/2017 Discharge date: 03/15/2017  Primary Care Physician:  [name], MD  Discharge Diagnoses:       Acute hypoxic respiratory failure   Multifocal pneumonia   Acute pulmonary embolism   COPD exacerbation  Acute on chronic diastolic CHF  Chronic Atrial fibrillation  . Sepsis ([place]) . Hypertension . Systolic murmur . Mitral regurgitation   Diabetes mellitus   Hypokalemia  Brief H and P: For complete details please refer to admission H and P, but in brief Patient is a 67 year old male with chronic atrial fibrillation, COPD, DJD, diabetes, hypertension, ? Diastolic CHF, pedal edema on Lasix brought to ED after he became confused at the physician's office around 10 AM in the morning, having acute onset of shortness of breath, hypoxic with O2 sat in low 80s. Reported fevers, chills or night sweats, chronic lower activity swelling and had not taken his diuretic. WBC count 18.4, lactic acid 2.89. Chest x-ray showed cardiomegaly with mild pulmonary vascular congestion, right base infiltrate suspicious for pneumonia. Patient was admitted and eventually placed on BiPAP in the stepdown unit.   Hospital Course:   Acute respiratory failure with hypoxia (HCC) secondary to COPD, acute on chronic diastolic CHF,Multifocal pneumonia -On admission WBC count elevated at 18, chest x-ray with vascular condition and right base infiltrate. Initially placed on Venti mask, then BiPAP - Currently Improving, home O2 evaluation done, needs 2 L O2  on ambulation - CTA chest showed multifocal pneumonia, placed on IV vancomycin and cefepime on 6/22, transitioned to oral Levaquin for a week  - The patient will need a repeat chest x-ray or a CT chest in 2-3 weeks to ensure complete resolution of multifocal pneumonia   Acute pulmonary embolism - Due to persistent hypoxia, CTA chest was done. CT angiogram of the chest showed small nonocclusive pulmonary embolism in the segmental and subsegmental pulmonary arteries to the lingula (patient is on warfarin prior to admission hence a warfarin failure) - Patient was placed on IV heparin. Discussed in detail with patient and his wife about warfarin failure and likely need to be on NOAC's, discussed about risks and benefits. Patient and his wife agreeable with NOAC's - After receiving copays from the case management, patient was started on xarelto.    Acute on chronic diastolic CHF - Prior 2-D echo in 09/2015 had shown EF of 60-65%, however chest x-ray with vascular congestion, on high-dose Lasix at home with retaining fluid -Improving now with negative balance of 4.7 L, weight down from 298 on 6/21 ->281 lbs at the time of discharge - continue Lasix 80 mg twice a day, received metolazone 6/22 - 2-D echo showed EF of 55% - Patient is in a high-dose Lasix and metolazone and has not been following with cardiology outpatient. I requested cardiology to follow outpatient, appointment scheduled on 03/18/17 with Dr. Stanford Breed.   Sepsis due to the pneumonia - Patient met sepsis criteria at the time of admission given tachycardia, tachypnea, fever, hypotension, leukocytosis. Tmax in ED 103.51F - Sepsis physiology improved  Atrial fibrillation, chronic  -  Rate controlled, continue metoprolol - Warfarin discontinued as patient had pulmonary embolism with therapeutic INR - Continue xarelto, [place] vasc score 4    Hypertension - BP currently improving, placed on low-dose metoprolol, continue Lasix and  metolazone  Diabetes mellitus  - Continue metformin, hemoglobin A1c 7.2 - Outpatient follow-up with PCP  Hypokalemia - Continue scheduled potassium replacement   He is here today for follow-up. Overall he states that his breathing is better. He would like to discontinue oxygen. He has chronic venous stasis changes in both legs but there is no pitting edema. There is no weeping ulcer as it is essentially healed on his anterior right shin. There are no pulmonary crackles on exam. He does not appear to be fluid overloaded weight according to our scales is 297 lbs.  Antibiotics have helped the shortness of breath. He denies any fevers or chills. He has quit smoking which I congratulated him on. On his exam, this is the best his lungs have ever sounded. There are no expiratory wheezes. He is in nature fibrillation this morning but he is rate controlled. He is not wearing his compression hose however he is being compliant with his diuretic therapy. He was diagnosed with a pulmonary embolism despite being on an adequate dose of Coumadin. Therefore he was switched to xarelto 15 mg pobid for 3 weeks then 20 mg poqday thereafter.  He is being compliant with this. He denies any bleeding or bruising. At that time, my plan was: Regarding his pneumonia, he needs to have a chest xray scheduled for 3 weeks. I will get this arranged. Clinically however he has no evidence of pneumonia on his exam today. Regarding his pulmonary embolism, he is on appropriate anticoagulant. He will be switching to 20 mg a day saying. I will check a CBC to rule out any acute anemia or evidence of bleeding. Regarding his COPD, he is quit smoking which is the mainstay of therapy. His exam today is unremarkable. This is the best his lungs sounded in years. I will continue him on oxygen for the next month and then if he is no longer hypoxic I will discontinue it at that point. Regarding his chronic diastolic heart failure, I believe is due to  a combination of obesity, COPD, smoking. I also believe that he likely has obstructive sleep apnea. I have mentioned this the patient on numerous occasions. He is now finally willing to allow me to schedule him for a sleep study to evaluate further to that we can adequately treat him  if present.  04/29/17 Patient has quit smoking. I congratulated him. His breathing is much better. He is 98% on room air. I ambulated with the patient for more than 200 feet and his oxygen saturations remained above 96%. There is no additional wheezing. He is wearing compression hose. The swelling in his legs is under control. There is no pitting edema in his legs. There is no venous stasis ulcers weeping edema coming from his legs. He denies any chest pain. He does report dyspnea on exertion consistent with deconditioning. He is not getting any exercise. He is relatively sedentary due to pain in his back knees and feet.  At that time, my plan was: Clinically the patient has improved dramatically. He is back at his baseline. He can discontinue oxygen at this point. He is scheduled for the sleep study later this month. He may benefit from CPAP. Congratulated the patient on quitting smoking. I encouraged him to get 30 minutes a  day 5 days a week of aerobic exercise. He will need to build on this gradually. I recommended water aerobics and gradually increasing his walking and exercise in the pool until he can do it for 30 minutes and then begin to exercise more vigorously outside the pool. Patient is due to recheck hemoglobin A1c fasting lipid panel and fasting lab work in 3 months.  07/30/17 Patient is here today to recheck his diabetes.  He denies any polyuria, polydipsia, or blurry vision.  He denies any neuropathy in his feet.  He is due for a diabetic eye exam and I recommended he schedule that at.  Diabetic foot exam is performed today and reveals mild decreased sensation to 10 g monofilament on the ball of his right foot.  He  is also due for urine microalbumin.  Regarding his chronic diastolic heart failure, his weight is stable.  There is no pitting edema in his legs.  Since I last saw him, he has quit smoking and I am extremely proud of him for this.  He has not had any recent COPD exacerbations since his last hospitalization.  He also recently went for a sleep study and was diagnosed with obstructive sleep apnea.  He is now compliant wearing his CPAP machine for the last week and does feel better.  He is due for fasting lab work.  Regarding his atrial fibrillation, he is in normal sinus rhythm today but he is still appropriately anticoagulated with Xarelto.  I will discontinue/cancel his INR order.  At that time, my plan was: Recommended diabetic eye exam.  Diabetic foot exam reveals mildly diminished sensation in the right foot.  I will check a hemoglobin A1c.  I will check a urine microalbumin.  I will check a fasting lipid panel.  Patient is no longer on Coumadin and I will canceled INR order placed by my nurse.  Continue to encourage aerobic exercise and weight loss.  Blood pressure today is excellent.  10/05/17 Since I last saw the patient, he has gained approximately 12 pounds.  He reports increasing dyspnea on exertion.  However there is no peripheral edema on his exam today.  Furthermore there are no crackles on his pulmonary exam.  Clinically the patient appears euvolemic with no evidence of fluid overload.  He does have faint expiratory wheezes and diminished breath sounds.  Patient has a history of COPD and has had frequent COPD exacerbations in the past.  He is currently not on a maintenance inhaler as his breathing had improved after smoking cessation.  He denies any chest pain.  He denies any orthopnea.  He denies any paroxysmal nocturnal dyspnea.  On exam today, he has an irregularly irregular cardiac rhythm suggesting recurrence of atrial fibrillation.  However EKG does in fact confirm P waves and demonstrates that  the patient is in normal sinus rhythm with frequent PACs.  There is no evidence of ischemia or infarction.  At that time, my plan was: I believe his dyspnea on exertion is likely related to morbid obesity, deconditioning, and underlying COPD.  EKG today shows no recurrence of his atrial fibrillation.  There is no evidence of pulmonary edema on exam of fluid overload on exam.  I will check a CBC to rule out anemia.  I will check a BNP to rule out evidence of fluid overload.  I will start the patient on anoro 1 inhalation a day and reassess the patient in 1 month to see if he is experiencing improvement.  EKG  today is reassuring.  11/05/17 At the patient's last visit, his BNP was over 200.  He has seen no benefit since trying anoro.  He is also gained 6 pounds since his last visit.  Altogether since last summer when he was hospitalized with heart failure, he is gradually gained 27 pounds.  He continues to report dyspnea on exertion.  He denies orthopnea or paroxysmal nocturnal dyspnea.  He denies any chest pain.  At that time, my plan was: Treatment for COPD has not helped his dyspnea on exertion.  I believe I was incorrect at his last visit.  I believe that with his gradual weight gain, the patient is retaining fluid and he is experiencing dyspnea on exertion due to that.  I recommended he increase his Lasix.  He is currently taking 40 mg twice daily.  I recommended increasing to 80 mg twice daily and rechecking in 1 week to monitor his weight as well as his kidney function and potassium.  Reassess in 1 week or sooner if worse  11/11/17 Wt Readings from Last 3 Encounters:  11/11/17 (!) 307 lb (139.3 kg)  11/05/17 (!) 316 lb (143.3 kg)  10/05/17 (!) 310 lb (140.6 kg)   Has lost 9 lbs. he states that his breathing is better.  He is not yet quite back to his dry weight.  I suspect that his dry weight is 299-300 pounds on our scales.  Therefore he needs to continue to diurese approximately 7 8 pounds but is  definitely doing better.  He is also due for lab work to assess his diabetes.  He denies any blurry vision.  He denies any hypoglycemic episodes.  His diabetic eye exam has been scheduled for March.  If he is overdue for a colonoscopy but he defers that at the present time.  He also declines HIV and hepatitis C screening due to lack of risk factors. Past Medical History:  Diagnosis Date  . Acute respiratory failure with hypoxia (HCC)    Hospitalized with acute respiratory failure secondary to CAP, diastolic CHF, and pulmonary embolism  . AKI (acute kidney injury) ([place])   . Allergy    allergic rhinitis  . Altered mental status   . Atrial fibrillation ([place])   . Chronic anticoagulation 03/26/2017   Failed Coumadin (PE with therapeutic INR). Now on Xarelto  . Colon polyps   . Community acquired pneumonia of right lower lobe of lung ([place])   . COPD (chronic obstructive pulmonary disease) ([place])   . Degenerative disc disease, lumbar   . Diabetes mellitus without complication ([place])   . History of pulmonary embolus (PE) 03/26/2017  . Hypertension   . Mitral regurgitation 12/26/2012  . Obesity (BMI 30-39.9) 12/09/2012  . PAF (paroxysmal atrial fibrillation) ([place])    DCCV in 2014 with recurrent PAF in setting of respiratory failure June 2018. S/P DCCV 04/13/17 to NSR  . Persistent atrial fibrillation ([place])   . Pneumonia ~ 2016  . Sepsis ([place]) 03/10/2017  . Sleep apnea   . Systolic murmur   . Type II diabetes mellitus ([place])    Past Surgical History:  Procedure Laterality Date  . CARDIOVERSION N/A 01/09/2013   Procedure: CARDIOVERSION;  Surgeon: Fay Records, MD;  Location: [facility];  Service: Cardiovascular;  Laterality: N/A;  . CARDIOVERSION N/A 04/13/2017   Procedure: CARDIOVERSION;  Surgeon: Sueanne Margarita, MD;  Location: [facility] ENDOSCOPY;  Service: Cardiovascular;  Laterality: N/A;  . TEE WITHOUT CARDIOVERSION N/A 01/09/2013   Procedure: TRANSESOPHAGEAL ECHOCARDIOGRAM (TEE);  Surgeon: Fay Records,  MD;  Location: [facility] ENDOSCOPY;  Service: Cardiovascular;  Laterality: N/A;   Current Outpatient Medications on File Prior to Visit  Medication Sig Dispense Refill  . fish oil-omega-3 fatty acids 1000 MG capsule Take 1 g by mouth daily.    . furosemide (LASIX) 40 MG tablet Take 2 tablets (80 mg total) by mouth 2 (two) times daily. 180 tablet 3  . metFORMIN (GLUCOPHAGE) 1000 MG tablet TAKE 1 TABLET BY MOUTH TWICE DAILY 180 tablet 1  . metolazone (ZAROXOLYN) 5 MG tablet Take 1 pill 30 minutes before lasix on Monday only 30 tablet 0  . metoprolol tartrate (LOPRESSOR) 50 MG tablet Take 1 tablet (50 mg total) by mouth 2 (two) times daily. 60 tablet 11  . potassium chloride SA (K-DUR,KLOR-CON) 20 MEQ tablet Take 2 tablets (40 mEq total) by mouth 2 (two) times daily. 360 tablet 3  . rivaroxaban (XARELTO) 20 MG TABS tablet Take 1 tablet (20 mg total) by mouth daily with supper. 90 tablet 3  . sitaGLIPtin (JANUVIA) 100 MG tablet Take 1 tablet (100 mg total) daily by mouth. 30 tablet 3  . umeclidinium-vilanterol (ANORO ELLIPTA) 62.5-25 MCG/INH AEPB Inhale 1 puff into the lungs daily.     No current facility-administered medications on file prior to visit.    No Known Allergies Social History   Socioeconomic History  . Marital status: Married    Spouse name: Not on file  . Number of children: 2  . Years of education: Not on file  . Highest education level: Not on file  Social Needs  . Financial resource strain: Not on file  . Food insecurity - worry: Not on file  . Food insecurity - inability: Not on file  . Transportation needs - medical: Not on file  . Transportation needs - non-medical: Not on file  Occupational History    Comment: Machinist  Tobacco Use  . Smoking status: Former Smoker    Packs/day: 1.00    Years: 43.00    Pack years: 43.00    Types: Cigarettes    Last attempt to quit: 03/08/2017    Years since quitting: 0.6  . Smokeless tobacco: Never Used  Substance and Sexual  Activity  . Alcohol use: No    Comment: Rare  . Drug use: No  . Sexual activity: Not Currently  Other Topics Concern  . Not on file  Social History Narrative  . Not on file      Review of Systems  All other systems reviewed and are negative.      Objective:   Physical Exam  Constitutional: He appears well-developed and well-nourished. No distress.  Neck: Neck supple. No JVD present.  Cardiovascular: Normal rate. An irregularly irregular rhythm present.  Pulmonary/Chest: Effort normal. He has decreased breath sounds. He has wheezes. He has rhonchi. He has no rales.  Abdominal: Soft. Bowel sounds are normal.  Musculoskeletal: He exhibits no edema.  Skin: No rash noted. He is not diaphoretic. No erythema.          Assessment & Plan:  Dyspnea on exertion - Plan: Brain natriuretic peptide, BASIC METABOLIC PANEL WITH GFR  Chronic congestive heart failure, unspecified heart failure type (HCC) - Plan: Brain natriuretic peptide, BASIC METABOLIC PANEL WITH GFR  Controlled type 2 diabetes mellitus without complication, without long-term current use of insulin (HCC) - Plan: Hemoglobin A1c, Lipid panel, Microalbumin, urine  Patient is doing better.  Continue Lasix 2 tablets p.o. twice daily indefinitely.  Explained  to the patient that his dry weight is near 299 pounds.  He needs to weigh himself every day and notify us if he sees weight gain beyond his dry weight.  I will check hemoglobin A1c, fasting lipid panel, and a urine microalbumin.  Recommended a colonoscopy but he declined at the present time

## 2017-11-12 LAB — BASIC METABOLIC PANEL WITH GFR
BUN / CREAT RATIO: 25 (calc) — AB (ref 6–22)
BUN: 46 mg/dL — AB (ref 7–25)
CHLORIDE: 88 mmol/L — AB (ref 98–110)
CO2: 32 mmol/L (ref 20–32)
Calcium: 9.9 mg/dL (ref 8.6–10.3)
Creat: 1.86 mg/dL — ABNORMAL HIGH (ref 0.70–1.25)
GFR, Est African American: 43 mL/min/{1.73_m2} — ABNORMAL LOW (ref >=60)
GFR, Est Non African American: 37 mL/min/{1.73_m2} — ABNORMAL LOW (ref >=60)
Glucose, Bld: 170 mg/dL — ABNORMAL HIGH (ref 65–99)
Potassium: 4 mmol/L (ref 3.5–5.3)
SODIUM: 133 mmol/L — AB (ref 135–146)

## 2017-11-12 LAB — LIPID PANEL
Cholesterol: 176 mg/dL (ref <200)
HDL: 35 mg/dL — ABNORMAL LOW (ref >40)
LDL CHOLESTEROL (CALC): 109 mg/dL — AB
Non-HDL Cholesterol (Calc): 141 mg/dL (calc) — ABNORMAL HIGH (ref <130)
TRIGLYCERIDES: 201 mg/dL — AB (ref <150)
Total CHOL/HDL Ratio: 5 (calc) — ABNORMAL HIGH (ref <5.0)

## 2017-11-12 LAB — HEMOGLOBIN A1C
HEMOGLOBIN A1C: 7.7 %{Hb} — AB (ref <5.7)
MEAN PLASMA GLUCOSE: 174 (calc)
eAG (mmol/L): 9.7 (calc)

## 2017-11-12 LAB — MICROALBUMIN, URINE: Microalb, Ur: 35.9 mg/dL

## 2017-11-12 LAB — BRAIN NATRIURETIC PEPTIDE: BRAIN NATRIURETIC PEPTIDE: 54 pg/mL (ref <100)

## 2017-11-17 ENCOUNTER — Other Ambulatory Visit: Payer: Self-pay | Admitting: Family Medicine

## 2017-11-17 MED ORDER — LISINOPRIL 10 MG PO TABS: 10.0000 mg | ORAL_TABLET | Freq: Every day | ORAL | 3 refills | Status: DC

## 2017-11-17 MED ORDER — DULAGLUTIDE 1.5 MG/0.5ML ~~LOC~~ SOAJ: 1.5000 mg | SUBCUTANEOUS | 3 refills | Status: DC

## 2017-11-17 NOTE — Progress Notes (Signed)
Lisinopril 

## 2017-11-22 ENCOUNTER — Telehealth: Payer: Self-pay | Admitting: Family Medicine

## 2017-11-22 LAB — HM DIABETES EYE EXAM

## 2017-11-22 NOTE — Telephone Encounter (Signed)
Pt called and states that he is taking Furosemide 40mg  2 tabs po bid and his weight is not coming down. It is fluctuating between 2-3 pounds but nothing significant. He said you wanted him down 6 pounds and he was up 1 lbs this am.

## 2017-11-23 NOTE — Telephone Encounter (Signed)
I would add spironolactone 25 mg a day and recheck weight on Friday

## 2017-11-23 NOTE — Telephone Encounter (Signed)
Call placed to patient he has been taking his metolazone 5mg   as well as his furosemide 40 mg  his weight on 2/21 was 307lb this morning it is 304lb

## 2017-11-23 NOTE — Telephone Encounter (Signed)
Did it improve yesterday after he took metolazone?  He usually takes that Monday.

## 2017-11-24 ENCOUNTER — Encounter: Payer: Self-pay | Admitting: Family Medicine

## 2017-11-24 MED ORDER — SPIRONOLACTONE 25 MG PO TABS: 25.0000 mg | ORAL_TABLET | Freq: Every day | ORAL | 0 refills | Status: DC

## 2017-11-24 NOTE — Telephone Encounter (Signed)
Call placed to patient he is aware of new medication spironolactone to start and then he will call to report his weight to PCP

## 2017-11-30 ENCOUNTER — Telehealth: Payer: Self-pay | Admitting: Family Medicine

## 2017-11-30 NOTE — Telephone Encounter (Signed)
Nothing in this note

## 2017-12-01 ENCOUNTER — Encounter: Payer: Self-pay | Admitting: *Deleted

## 2017-12-02 ENCOUNTER — Encounter: Payer: Self-pay | Admitting: Family Medicine

## 2017-12-02 ENCOUNTER — Ambulatory Visit (INDEPENDENT_AMBULATORY_CARE_PROVIDER_SITE_OTHER): Payer: BLUE CROSS/BLUE SHIELD | Admitting: Family Medicine

## 2017-12-02 VITALS — BP 128/84 | HR 99 | Temp 98.0°F | Resp 20 | Ht 73.0 in | Wt 308.0 lb

## 2017-12-02 DIAGNOSIS — R0609 Other forms of dyspnea: Secondary | ICD-10-CM | POA: Diagnosis not present

## 2017-12-02 DIAGNOSIS — R0600 Dyspnea, unspecified: Secondary | ICD-10-CM

## 2017-12-02 DIAGNOSIS — I509 Heart failure, unspecified: Secondary | ICD-10-CM

## 2017-12-02 NOTE — Progress Notes (Signed)
Subjective:    Patient ID: William Alvarado, male    DOB: 1951-06-04, 67 y.o.   MRN: [id number]  Medication Refill     03/2017 Patient was recently admitted to the hospital with community-acquired pneumonia, altered mental status secondary to hypoxia, pulmonary embolism, and acute on chronic diastolic heart failure with fluid overload. I have copied relevant portions of the discharge summary and included them below for my reference:  Admit date: 03/10/2017 Discharge date: 03/15/2017  Primary Care Physician:  [name], MD  Discharge Diagnoses:       Acute hypoxic respiratory failure   Multifocal pneumonia   Acute pulmonary embolism   COPD exacerbation  Acute on chronic diastolic CHF  Chronic Atrial fibrillation  . Sepsis ([place]) . Hypertension . Systolic murmur . Mitral regurgitation   Diabetes mellitus   Hypokalemia  Brief H and P: For complete details please refer to admission H and P, but in brief Patient is a 67 year old male with chronic atrial fibrillation, COPD, DJD, diabetes, hypertension, ? Diastolic CHF, pedal edema on Lasix brought to ED after he became confused at the physician's office around 10 AM in the morning, having acute onset of shortness of breath, hypoxic with O2 sat in low 80s. Reported fevers, chills or night sweats, chronic lower activity swelling and had not taken his diuretic. WBC count 18.4, lactic acid 2.89. Chest x-ray showed cardiomegaly with mild pulmonary vascular congestion, right base infiltrate suspicious for pneumonia. Patient was admitted and eventually placed on BiPAP in the stepdown unit.   Hospital Course:   Acute respiratory failure with hypoxia (HCC) secondary to COPD, acute on chronic diastolic CHF,Multifocal pneumonia -On admission WBC count elevated at 18, chest x-ray with vascular condition and right base infiltrate. Initially placed on Venti mask, then BiPAP - Currently Improving, home O2 evaluation done, needs 2 L O2  on ambulation - CTA chest showed multifocal pneumonia, placed on IV vancomycin and cefepime on 6/22, transitioned to oral Levaquin for a week  - The patient will need a repeat chest x-ray or a CT chest in 2-3 weeks to ensure complete resolution of multifocal pneumonia   Acute pulmonary embolism - Due to persistent hypoxia, CTA chest was done. CT angiogram of the chest showed small nonocclusive pulmonary embolism in the segmental and subsegmental pulmonary arteries to the lingula (patient is on warfarin prior to admission hence a warfarin failure) - Patient was placed on IV heparin. Discussed in detail with patient and his wife about warfarin failure and likely need to be on NOAC's, discussed about risks and benefits. Patient and his wife agreeable with NOAC's - After receiving copays from the case management, patient was started on xarelto.    Acute on chronic diastolic CHF - Prior 2-D echo in 09/2015 had shown EF of 60-65%, however chest x-ray with vascular congestion, on high-dose Lasix at home with retaining fluid -Improving now with negative balance of 4.7 L, weight down from 298 on 6/21 ->281 lbs at the time of discharge - continue Lasix 80 mg twice a day, received metolazone 6/22 - 2-D echo showed EF of 55% - Patient is in a high-dose Lasix and metolazone and has not been following with cardiology outpatient. I requested cardiology to follow outpatient, appointment scheduled on 03/18/17 with Dr. Stanford Breed.   Sepsis due to the pneumonia - Patient met sepsis criteria at the time of admission given tachycardia, tachypnea, fever, hypotension, leukocytosis. Tmax in ED 103.51F - Sepsis physiology improved  Atrial fibrillation, chronic  -  Rate controlled, continue metoprolol - Warfarin discontinued as patient had pulmonary embolism with therapeutic INR - Continue xarelto, [place] vasc score 4    Hypertension - BP currently improving, placed on low-dose metoprolol, continue Lasix and  metolazone  Diabetes mellitus  - Continue metformin, hemoglobin A1c 7.2 - Outpatient follow-up with PCP  Hypokalemia - Continue scheduled potassium replacement   He is here today for follow-up. Overall he states that his breathing is better. He would like to discontinue oxygen. He has chronic venous stasis changes in both legs but there is no pitting edema. There is no weeping ulcer as it is essentially healed on his anterior right shin. There are no pulmonary crackles on exam. He does not appear to be fluid overloaded weight according to our scales is 297 lbs.  Antibiotics have helped the shortness of breath. He denies any fevers or chills. He has quit smoking which I congratulated him on. On his exam, this is the best his lungs have ever sounded. There are no expiratory wheezes. He is in nature fibrillation this morning but he is rate controlled. He is not wearing his compression hose however he is being compliant with his diuretic therapy. He was diagnosed with a pulmonary embolism despite being on an adequate dose of Coumadin. Therefore he was switched to xarelto 15 mg pobid for 3 weeks then 20 mg poqday thereafter.  He is being compliant with this. He denies any bleeding or bruising. At that time, my plan was: Regarding his pneumonia, he needs to have a chest xray scheduled for 3 weeks. I will get this arranged. Clinically however he has no evidence of pneumonia on his exam today. Regarding his pulmonary embolism, he is on appropriate anticoagulant. He will be switching to 20 mg a day saying. I will check a CBC to rule out any acute anemia or evidence of bleeding. Regarding his COPD, he is quit smoking which is the mainstay of therapy. His exam today is unremarkable. This is the best his lungs sounded in years. I will continue him on oxygen for the next month and then if he is no longer hypoxic I will discontinue it at that point. Regarding his chronic diastolic heart failure, I believe is due to  a combination of obesity, COPD, smoking. I also believe that he likely has obstructive sleep apnea. I have mentioned this the patient on numerous occasions. He is now finally willing to allow me to schedule him for a sleep study to evaluate further to that we can adequately treat him  if present.  04/29/17 Patient has quit smoking. I congratulated him. His breathing is much better. He is 98% on room air. I ambulated with the patient for more than 200 feet and his oxygen saturations remained above 96%. There is no additional wheezing. He is wearing compression hose. The swelling in his legs is under control. There is no pitting edema in his legs. There is no venous stasis ulcers weeping edema coming from his legs. He denies any chest pain. He does report dyspnea on exertion consistent with deconditioning. He is not getting any exercise. He is relatively sedentary due to pain in his back knees and feet.  At that time, my plan was: Clinically the patient has improved dramatically. He is back at his baseline. He can discontinue oxygen at this point. He is scheduled for the sleep study later this month. He may benefit from CPAP. Congratulated the patient on quitting smoking. I encouraged him to get 30 minutes a  day 5 days a week of aerobic exercise. He will need to build on this gradually. I recommended water aerobics and gradually increasing his walking and exercise in the pool until he can do it for 30 minutes and then begin to exercise more vigorously outside the pool. Patient is due to recheck hemoglobin A1c fasting lipid panel and fasting lab work in 3 months.  07/30/17 Patient is here today to recheck his diabetes.  He denies any polyuria, polydipsia, or blurry vision.  He denies any neuropathy in his feet.  He is due for a diabetic eye exam and I recommended he schedule that at.  Diabetic foot exam is performed today and reveals mild decreased sensation to 10 g monofilament on the ball of his right foot.  He  is also due for urine microalbumin.  Regarding his chronic diastolic heart failure, his weight is stable.  There is no pitting edema in his legs.  Since I last saw him, he has quit smoking and I am extremely proud of him for this.  He has not had any recent COPD exacerbations since his last hospitalization.  He also recently went for a sleep study and was diagnosed with obstructive sleep apnea.  He is now compliant wearing his CPAP machine for the last week and does feel better.  He is due for fasting lab work.  Regarding his atrial fibrillation, he is in normal sinus rhythm today but he is still appropriately anticoagulated with Xarelto.  I will discontinue/cancel his INR order.  At that time, my plan was: Recommended diabetic eye exam.  Diabetic foot exam reveals mildly diminished sensation in the right foot.  I will check a hemoglobin A1c.  I will check a urine microalbumin.  I will check a fasting lipid panel.  Patient is no longer on Coumadin and I will canceled INR order placed by my nurse.  Continue to encourage aerobic exercise and weight loss.  Blood pressure today is excellent.  10/05/17 Since I last saw the patient, he has gained approximately 12 pounds.  He reports increasing dyspnea on exertion.  However there is no peripheral edema on his exam today.  Furthermore there are no crackles on his pulmonary exam.  Clinically the patient appears euvolemic with no evidence of fluid overload.  He does have faint expiratory wheezes and diminished breath sounds.  Patient has a history of COPD and has had frequent COPD exacerbations in the past.  He is currently not on a maintenance inhaler as his breathing had improved after smoking cessation.  He denies any chest pain.  He denies any orthopnea.  He denies any paroxysmal nocturnal dyspnea.  On exam today, he has an irregularly irregular cardiac rhythm suggesting recurrence of atrial fibrillation.  However EKG does in fact confirm P waves and demonstrates that  the patient is in normal sinus rhythm with frequent PACs.  There is no evidence of ischemia or infarction.  At that time, my plan was: I believe his dyspnea on exertion is likely related to morbid obesity, deconditioning, and underlying COPD.  EKG today shows no recurrence of his atrial fibrillation.  There is no evidence of pulmonary edema on exam of fluid overload on exam.  I will check a CBC to rule out anemia.  I will check a BNP to rule out evidence of fluid overload.  I will start the patient on anoro 1 inhalation a day and reassess the patient in 1 month to see if he is experiencing improvement.  EKG  today is reassuring.  11/05/17 At the patient's last visit, his BNP was over 200.  He has seen no benefit since trying anoro.  He is also gained 6 pounds since his last visit.  Altogether since last summer when he was hospitalized with heart failure, he is gradually gained 27 pounds.  He continues to report dyspnea on exertion.  He denies orthopnea or paroxysmal nocturnal dyspnea.  He denies any chest pain.  At that time, my plan was: Treatment for COPD has not helped his dyspnea on exertion.  I believe I was incorrect at his last visit.  I believe that with his gradual weight gain, the patient is retaining fluid and he is experiencing dyspnea on exertion due to that.  I recommended he increase his Lasix.  He is currently taking 40 mg twice daily.  I recommended increasing to 80 mg twice daily and rechecking in 1 week to monitor his weight as well as his kidney function and potassium.  Reassess in 1 week or sooner if worse  11/11/17 Wt Readings from Last 3 Encounters:  12/02/17 (!) 308 lb (139.7 kg)  11/11/17 (!) 307 lb (139.3 kg)  11/05/17 (!) 316 lb (143.3 kg)   Has lost 9 lbs. he states that his breathing is better.  He is not yet quite back to his dry weight.  I suspect that his dry weight is 299-300 pounds on our scales.  Therefore he needs to continue to diurese approximately 7 8 pounds but is  definitely doing better.  He is also due for lab work to assess his diabetes.  He denies any blurry vision.  He denies any hypoglycemic episodes.  His diabetic eye exam has been scheduled for March.  If he is overdue for a colonoscopy but he defers that at the present time.  He also declines HIV and hepatitis C screening due to lack of risk factors.  At that time, my plan was: Patient is doing better.  Continue Lasix 2 tablets p.o. twice daily indefinitely.  Explained to the patient that his dry weight is near 299 pounds.  He needs to weigh himself every day and notify us if he sees weight gain beyond his dry weight.  I will check hemoglobin A1c, fasting lipid panel, and a urine microalbumin.  Recommended a colonoscopy but he declined at the present time  12/02/17 Patient reports a cough that will not go away.  He also reports dyspnea on exertion.  He has bipedal edema.  He denies any fevers or chills.  He does report shortness of breath.  He denies chest pain.  On exam, he has prominent left basilar crackles and rails.  There are Janna Arch on the right side.  He has +1 pitting edema in both legs.  There is no JVD.  He is in normal sinus rhythm.  He denies any fevers or chills or purulent sputum Past Medical History:  Diagnosis Date  . Acute respiratory failure with hypoxia (HCC)    Hospitalized with acute respiratory failure secondary to CAP, diastolic CHF, and pulmonary embolism  . AKI (acute kidney injury) ([place])   . Allergy    allergic rhinitis  . Altered mental status   . Atrial fibrillation ([place])   . Chronic anticoagulation 03/26/2017   Failed Coumadin (PE with therapeutic INR). Now on Xarelto  . Colon polyps   . Community acquired pneumonia of right lower lobe of lung ([place])   . COPD (chronic obstructive pulmonary disease) ([place])   . Degenerative  disc disease, lumbar   . Diabetes mellitus without complication ([place])   . History of pulmonary embolus (PE) 03/26/2017  . Hypertension   . Mitral  regurgitation 12/26/2012  . Obesity (BMI 30-39.9) 12/09/2012  . PAF (paroxysmal atrial fibrillation) ([place])    DCCV in 2014 with recurrent PAF in setting of respiratory failure June 2018. S/P DCCV 04/13/17 to NSR  . Persistent atrial fibrillation ([place])   . Pneumonia ~ 2016  . Sepsis ([place]) 03/10/2017  . Sleep apnea   . Systolic murmur   . Type II diabetes mellitus ([place])    Past Surgical History:  Procedure Laterality Date  . CARDIOVERSION N/A 01/09/2013   Procedure: CARDIOVERSION;  Surgeon: Fay Records, MD;  Location: [facility];  Service: Cardiovascular;  Laterality: N/A;  . CARDIOVERSION N/A 04/13/2017   Procedure: CARDIOVERSION;  Surgeon: Sueanne Margarita, MD;  Location: [facility] ENDOSCOPY;  Service: Cardiovascular;  Laterality: N/A;  . TEE WITHOUT CARDIOVERSION N/A 01/09/2013   Procedure: TRANSESOPHAGEAL ECHOCARDIOGRAM (TEE);  Surgeon: Fay Records, MD;  Location: [facility] ENDOSCOPY;  Service: Cardiovascular;  Laterality: N/A;   Current Outpatient Medications on File Prior to Visit  Medication Sig Dispense Refill  . Dulaglutide (TRULICITY) 1.5 QQ/5.9DG SOPN Inject 1.5 mg into the skin once a week. 2 mL 3  . fish oil-omega-3 fatty acids 1000 MG capsule Take 1 g by mouth daily.    . furosemide (LASIX) 40 MG tablet Take 2 tablets (80 mg total) by mouth 2 (two) times daily. 180 tablet 3  . metolazone (ZAROXOLYN) 5 MG tablet Take 1 pill 30 minutes before lasix on Monday only 30 tablet 0  . metoprolol tartrate (LOPRESSOR) 50 MG tablet Take 1 tablet (50 mg total) by mouth 2 (two) times daily. 60 tablet 11  . potassium chloride SA (K-DUR,KLOR-CON) 20 MEQ tablet Take 2 tablets (40 mEq total) by mouth 2 (two) times daily. 360 tablet 3  . rivaroxaban (XARELTO) 20 MG TABS tablet Take 1 tablet (20 mg total) by mouth daily with supper. 90 tablet 3  . sitaGLIPtin (JANUVIA) 100 MG tablet Take 1 tablet (100 mg total) daily by mouth. 30 tablet 3  . umeclidinium-vilanterol (ANORO ELLIPTA) 62.5-25 MCG/INH AEPB Inhale 1  puff into the lungs daily.    Marland Kitchen lisinopril (PRINIVIL,ZESTRIL) 10 MG tablet Take 1 tablet (10 mg total) by mouth daily. (Patient not taking: Reported on 12/02/2017) 90 tablet 3  . spironolactone (ALDACTONE) 25 MG tablet Take 1 tablet (25 mg total) by mouth daily. (Patient not taking: Reported on 12/02/2017) 30 tablet 0   No current facility-administered medications on file prior to visit.    No Known Allergies Social History   Socioeconomic History  . Marital status: Married    Spouse name: Not on file  . Number of children: 2  . Years of education: Not on file  . Highest education level: Not on file  Social Needs  . Financial resource strain: Not on file  . Food insecurity - worry: Not on file  . Food insecurity - inability: Not on file  . Transportation needs - medical: Not on file  . Transportation needs - non-medical: Not on file  Occupational History    Comment: Machinist  Tobacco Use  . Smoking status: Former Smoker    Packs/day: 1.00    Years: 43.00    Pack years: 43.00    Types: Cigarettes    Last attempt to quit: 03/08/2017    Years since quitting: 0.7  . Smokeless tobacco: Never Used  Substance and Sexual Activity  . Alcohol use: No    Comment: Rare  . Drug use: No  . Sexual activity: Not Currently  Other Topics Concern  . Not on file  Social History Narrative  . Not on file      Review of Systems  All other systems reviewed and are negative.      Objective:   Physical Exam  Constitutional: He appears well-developed and well-nourished. No distress.  Neck: Neck supple. No JVD present.  Cardiovascular: Normal rate. An irregularly irregular rhythm present.  Pulmonary/Chest: Effort normal. He has decreased breath sounds. He has wheezes. He has rhonchi. He has no rales.  Abdominal: Soft. Bowel sounds are normal.  Musculoskeletal: He exhibits no edema.  Skin: No rash noted. He is not diaphoretic. No erythema.          Assessment & Plan:  Suspected  pulmonary edema due to diastolic heart failure.  Continue Lasix 80 mg p.o. twice daily but increase Zaroxolyn to 5 mg on Mondays and Fridays to improve diuresis.  Recheck on Monday and if edema and coughing is not better, will increase Lasix to 120 mg p.o. twice daily

## 2017-12-03 ENCOUNTER — Telehealth: Payer: Self-pay | Admitting: Family Medicine

## 2017-12-03 NOTE — Telephone Encounter (Signed)
Patient's wife called [facility] stating she had questions about the medication Dr. Tanya Nones rx'd at [facility] and she is not going to let him take them until we talk to her to please call her back.  Tried to call pt no answer and no vm.

## 2017-12-06 NOTE — Telephone Encounter (Signed)
Pt called [facility] wanting to know how long she should take his medication on Mon & Firday? He states that he is down a few pounds but still feels really congested in his lung area.

## 2017-12-06 NOTE — Telephone Encounter (Signed)
Increase lasix 120 bid, and recheck here Friday.

## 2017-12-06 NOTE — Telephone Encounter (Signed)
Tried to call no answer no vm 

## 2017-12-06 NOTE — Telephone Encounter (Signed)
Pt aware of recommendations and apt made

## 2017-12-07 ENCOUNTER — Other Ambulatory Visit: Payer: Self-pay

## 2017-12-07 MED ORDER — SPIRONOLACTONE 25 MG PO TABS: 25.0000 mg | ORAL_TABLET | Freq: Every day | ORAL | 0 refills | Status: DC

## 2017-12-08 ENCOUNTER — Telehealth: Payer: Self-pay | Admitting: Family Medicine

## 2017-12-08 NOTE — Telephone Encounter (Signed)
Pt's wife called and states that since increasing his Furosemide to 3 tabs bid he is having bad cramps and would like to know if he needs to increase his potassium as well??? He is taking 40mg  bid of potassium. Please advise.

## 2017-12-09 ENCOUNTER — Inpatient Hospital Stay (HOSPITAL_COMMUNITY)
Admission: EM | Admit: 2017-12-09 | Discharge: 2017-12-12 | DRG: 871 | Disposition: A | Discharge status: Home / Self Care | Payer: BLUE CROSS/BLUE SHIELD | Attending: Internal Medicine | Admitting: Internal Medicine

## 2017-12-09 ENCOUNTER — Telehealth: Payer: Self-pay | Admitting: Family Medicine

## 2017-12-09 ENCOUNTER — Ambulatory Visit: Payer: Self-pay | Admitting: Neurology

## 2017-12-09 ENCOUNTER — Encounter (HOSPITAL_COMMUNITY): Payer: Self-pay | Admitting: *Deleted

## 2017-12-09 ENCOUNTER — Emergency Department (HOSPITAL_COMMUNITY): Payer: BLUE CROSS/BLUE SHIELD

## 2017-12-09 ENCOUNTER — Other Ambulatory Visit: Payer: Self-pay | Admitting: Family Medicine

## 2017-12-09 DIAGNOSIS — I9589 Other hypotension: Secondary | ICD-10-CM | POA: Diagnosis not present

## 2017-12-09 DIAGNOSIS — R3911 Hesitancy of micturition: Secondary | ICD-10-CM | POA: Diagnosis present

## 2017-12-09 DIAGNOSIS — N183 Chronic kidney disease, stage 3 (moderate): Secondary | ICD-10-CM | POA: Diagnosis present

## 2017-12-09 DIAGNOSIS — I130 Hypertensive heart and chronic kidney disease with heart failure and stage 1 through stage 4 chronic kidney disease, or unspecified chronic kidney disease: Secondary | ICD-10-CM | POA: Diagnosis present

## 2017-12-09 DIAGNOSIS — N179 Acute kidney failure, unspecified: Secondary | ICD-10-CM | POA: Diagnosis present

## 2017-12-09 DIAGNOSIS — J181 Lobar pneumonia, unspecified organism: Secondary | ICD-10-CM | POA: Diagnosis present

## 2017-12-09 DIAGNOSIS — Z86711 Personal history of pulmonary embolism: Secondary | ICD-10-CM

## 2017-12-09 DIAGNOSIS — I481 Persistent atrial fibrillation: Secondary | ICD-10-CM | POA: Diagnosis present

## 2017-12-09 DIAGNOSIS — E119 Type 2 diabetes mellitus without complications: Secondary | ICD-10-CM | POA: Diagnosis present

## 2017-12-09 DIAGNOSIS — E861 Hypovolemia: Secondary | ICD-10-CM | POA: Diagnosis present

## 2017-12-09 DIAGNOSIS — R571 Hypovolemic shock: Principal | ICD-10-CM | POA: Diagnosis present

## 2017-12-09 DIAGNOSIS — E872 Acidosis: Secondary | ICD-10-CM | POA: Diagnosis present

## 2017-12-09 DIAGNOSIS — R579 Shock, unspecified: Secondary | ICD-10-CM

## 2017-12-09 DIAGNOSIS — I959 Hypotension, unspecified: Secondary | ICD-10-CM | POA: Diagnosis present

## 2017-12-09 DIAGNOSIS — J440 Chronic obstructive pulmonary disease with acute lower respiratory infection: Secondary | ICD-10-CM | POA: Diagnosis present

## 2017-12-09 DIAGNOSIS — N189 Chronic kidney disease, unspecified: Secondary | ICD-10-CM

## 2017-12-09 DIAGNOSIS — R5381 Other malaise: Secondary | ICD-10-CM | POA: Diagnosis not present

## 2017-12-09 DIAGNOSIS — Z6841 Body Mass Index (BMI) 40.0 and over, adult: Secondary | ICD-10-CM

## 2017-12-09 DIAGNOSIS — E871 Hypo-osmolality and hyponatremia: Secondary | ICD-10-CM | POA: Diagnosis present

## 2017-12-09 DIAGNOSIS — Z87891 Personal history of nicotine dependence: Secondary | ICD-10-CM

## 2017-12-09 DIAGNOSIS — I482 Chronic atrial fibrillation: Secondary | ICD-10-CM | POA: Diagnosis present

## 2017-12-09 DIAGNOSIS — I5032 Chronic diastolic (congestive) heart failure: Secondary | ICD-10-CM | POA: Diagnosis present

## 2017-12-09 DIAGNOSIS — Z7984 Long term (current) use of oral hypoglycemic drugs: Secondary | ICD-10-CM

## 2017-12-09 DIAGNOSIS — J189 Pneumonia, unspecified organism: Secondary | ICD-10-CM

## 2017-12-09 DIAGNOSIS — E860 Dehydration: Secondary | ICD-10-CM

## 2017-12-09 DIAGNOSIS — G4730 Sleep apnea, unspecified: Secondary | ICD-10-CM | POA: Diagnosis present

## 2017-12-09 DIAGNOSIS — Z7901 Long term (current) use of anticoagulants: Secondary | ICD-10-CM | POA: Diagnosis not present

## 2017-12-09 DIAGNOSIS — I480 Paroxysmal atrial fibrillation: Secondary | ICD-10-CM | POA: Diagnosis present

## 2017-12-09 DIAGNOSIS — Z79899 Other long term (current) drug therapy: Secondary | ICD-10-CM | POA: Diagnosis not present

## 2017-12-09 DIAGNOSIS — E1122 Type 2 diabetes mellitus with diabetic chronic kidney disease: Secondary | ICD-10-CM | POA: Diagnosis present

## 2017-12-09 LAB — CBC WITH DIFFERENTIAL/PLATELET
BASOS ABS: 0 10*3/uL (ref 0.0–0.1)
BASOS PCT: 0 %
Eosinophils Absolute: 0.2 10*3/uL (ref 0.0–0.7)
Eosinophils Relative: 1 %
HEMATOCRIT: 42.2 % (ref 39.0–52.0)
Hemoglobin: 14.2 g/dL (ref 13.0–17.0)
Lymphocytes Relative: 15 %
Lymphs Abs: 2.1 10*3/uL (ref 0.7–4.0)
MCH: 30.1 pg (ref 26.0–34.0)
MCHC: 33.6 g/dL (ref 30.0–36.0)
MCV: 89.6 fL (ref 78.0–100.0)
MONO ABS: 1 10*3/uL (ref 0.1–1.0)
Monocytes Relative: 7 %
NEUTROS ABS: 11.3 10*3/uL — AB (ref 1.7–7.7)
Neutrophils Relative %: 77 %
Platelets: 320 10*3/uL (ref 150–400)
RBC: 4.71 MIL/uL (ref 4.22–5.81)
RDW: 15.5 % (ref 11.5–15.5)
WBC: 14.7 10*3/uL — ABNORMAL HIGH (ref 4.0–10.5)

## 2017-12-09 LAB — I-STAT VENOUS BLOOD GAS, ED
Acid-Base Excess: 6 mmol/L — ABNORMAL HIGH (ref 0.0–2.0)
Bicarbonate: 33.4 mmol/L — ABNORMAL HIGH (ref 20.0–28.0)
O2 Saturation: 60 %
PH VEN: 7.369 (ref 7.250–7.430)
TCO2: 35 mmol/L — ABNORMAL HIGH (ref 22–32)
pCO2, Ven: 57.9 mmHg (ref 44.0–60.0)
pO2, Ven: 33 mmHg (ref 32.0–45.0)

## 2017-12-09 LAB — I-STAT CHEM 8, ED
BUN: 78 mg/dL — ABNORMAL HIGH (ref 6–20)
Calcium, Ion: 1.01 mmol/L — ABNORMAL LOW (ref 1.15–1.40)
Chloride: 81 mmol/L — ABNORMAL LOW (ref 101–111)
Creatinine, Ser: 5.2 mg/dL — ABNORMAL HIGH (ref 0.61–1.24)
Glucose, Bld: 190 mg/dL — ABNORMAL HIGH (ref 65–99)
HCT: 47 % (ref 39.0–52.0)
HEMOGLOBIN: 16 g/dL (ref 13.0–17.0)
POTASSIUM: 3.4 mmol/L — AB (ref 3.5–5.1)
Sodium: 125 mmol/L — ABNORMAL LOW (ref 135–145)
TCO2: 32 mmol/L (ref 22–32)

## 2017-12-09 LAB — GLUCOSE, CAPILLARY
GLUCOSE-CAPILLARY: 116 mg/dL — AB (ref 65–99)
Glucose-Capillary: 115 mg/dL — ABNORMAL HIGH (ref 65–99)

## 2017-12-09 LAB — COMPREHENSIVE METABOLIC PANEL
ALT: 24 U/L (ref 17–63)
ANION GAP: 18 — AB (ref 5–15)
AST: 26 U/L (ref 15–41)
Albumin: 3.5 g/dL (ref 3.5–5.0)
Alkaline Phosphatase: 59 U/L (ref 38–126)
BUN: 81 mg/dL — ABNORMAL HIGH (ref 6–20)
CHLORIDE: 80 mmol/L — AB (ref 101–111)
CO2: 28 mmol/L (ref 22–32)
CREATININE: 5.17 mg/dL — AB (ref 0.61–1.24)
Calcium: 8.8 mg/dL — ABNORMAL LOW (ref 8.9–10.3)
GFR, EST AFRICAN AMERICAN: 12 mL/min — AB (ref >60)
GFR, EST NON AFRICAN AMERICAN: 11 mL/min — AB (ref >60)
Glucose, Bld: 192 mg/dL — ABNORMAL HIGH (ref 65–99)
Potassium: 3.3 mmol/L — ABNORMAL LOW (ref 3.5–5.1)
Sodium: 126 mmol/L — ABNORMAL LOW (ref 135–145)
Total Bilirubin: 0.7 mg/dL (ref 0.3–1.2)
Total Protein: 7.2 g/dL (ref 6.5–8.1)

## 2017-12-09 LAB — APTT: APTT: 45 s — AB (ref 24–36)

## 2017-12-09 LAB — I-STAT CG4 LACTIC ACID, ED
Lactic Acid, Venous: 2.48 mmol/L (ref 0.5–1.9)
Lactic Acid, Venous: 2.24 mmol/L (ref 0.5–1.9)

## 2017-12-09 LAB — MRSA PCR SCREENING: MRSA by PCR: NEGATIVE

## 2017-12-09 LAB — TROPONIN I: TROPONIN I: 0.05 ng/mL — AB (ref <0.03)

## 2017-12-09 LAB — BRAIN NATRIURETIC PEPTIDE: B NATRIURETIC PEPTIDE 5: 84.5 pg/mL (ref 0.0–100.0)

## 2017-12-09 MED ORDER — INSULIN GLARGINE 100 UNIT/ML ~~LOC~~ SOLN
10.0000 [IU] | Freq: Every day | SUBCUTANEOUS | Status: DC
  Administered 2017-12-09 – 2017-12-10 (×2): 10 [IU] via SUBCUTANEOUS
  Filled 2017-12-09 (×5): qty 0.1

## 2017-12-09 MED ORDER — SODIUM CHLORIDE 0.9 % IV SOLN
500.0000 mg | Freq: Once | INTRAVENOUS | Status: AC
  Administered 2017-12-09: 500 mg via INTRAVENOUS
  Filled 2017-12-09: qty 500

## 2017-12-09 MED ORDER — SODIUM CHLORIDE 0.9 % IV BOLUS (SEPSIS)
1000.0000 mL | Freq: Once | INTRAVENOUS | Status: AC
  Administered 2017-12-09: 1000 mL via INTRAVENOUS

## 2017-12-09 MED ORDER — INSULIN ASPART 100 UNIT/ML ~~LOC~~ SOLN
0.0000 [IU] | Freq: Three times a day (TID) | SUBCUTANEOUS | Status: DC
  Administered 2017-12-10 (×2): 2 [IU] via SUBCUTANEOUS
  Administered 2017-12-11 (×3): 3 [IU] via SUBCUTANEOUS
  Administered 2017-12-12: 2 [IU] via SUBCUTANEOUS

## 2017-12-09 MED ORDER — SODIUM CHLORIDE 0.9 % IV SOLN
250.0000 mL | INTRAVENOUS | Status: DC | PRN
  Administered 2017-12-10: 250 mL via INTRAVENOUS

## 2017-12-09 MED ORDER — CEFTRIAXONE SODIUM 1 G IJ SOLR
1.0000 g | Freq: Once | INTRAMUSCULAR | Status: AC
  Administered 2017-12-09: 1 g via INTRAVENOUS
  Filled 2017-12-09: qty 10

## 2017-12-09 MED ORDER — ORAL CARE MOUTH RINSE
15.0000 mL | Freq: Two times a day (BID) | OROMUCOSAL | Status: DC
  Administered 2017-12-09: 15 mL via OROMUCOSAL

## 2017-12-09 MED ORDER — SODIUM CHLORIDE 0.9 % IV BOLUS (SEPSIS)
500.0000 mL | Freq: Once | INTRAVENOUS | Status: AC
  Administered 2017-12-09: 500 mL via INTRAVENOUS

## 2017-12-09 MED ORDER — ACETAMINOPHEN 325 MG PO TABS
650.0000 mg | ORAL_TABLET | ORAL | Status: DC | PRN
  Administered 2017-12-11: 650 mg via ORAL
  Filled 2017-12-09: qty 2

## 2017-12-09 MED ORDER — HEPARIN (PORCINE) IN NACL 100-0.45 UNIT/ML-% IJ SOLN
1700.0000 [IU]/h | INTRAMUSCULAR | Status: AC
  Administered 2017-12-09 – 2017-12-11 (×3): 1700 [IU]/h via INTRAVENOUS
  Filled 2017-12-09 (×3): qty 250

## 2017-12-09 MED ORDER — LACTATED RINGERS IV BOLUS (SEPSIS)
1000.0000 mL | Freq: Once | INTRAVENOUS | Status: AC
  Administered 2017-12-09: 1000 mL via INTRAVENOUS

## 2017-12-09 NOTE — Progress Notes (Signed)
eLink Physician-Brief Progress Note Patient Name: William Alvarado DOB: 02-04-1951 MRN: [id number]   Date of Service  12/09/2017  HPI/Events of Note  Shock, new renal failure in setting aggressive diuresis, ACE-I. Remains hypotensive after 5+L resuscitation. mentating perfectly, ? Accurate cuff pressures. May need art line  eICU Interventions  Additional 1L LR now     Intervention Category Major Interventions: Shock - evaluation and management  Leslye Peer 12/09/2017, 8:02 PM

## 2017-12-09 NOTE — Telephone Encounter (Signed)
Swing by and check potassium level today so we will have it at [facility] tomorrow but he could take an extra 20 meq of potassium today.

## 2017-12-09 NOTE — Telephone Encounter (Signed)
Pts wife called and states that pt is admitted into the ICU with pneumonia and kidney failure.

## 2017-12-09 NOTE — H&P (Signed)
PULMONARY / CRITICAL CARE MEDICINE   Name: William Alvarado MRN: [id number] DOB: Jan 07, 1951    ADMISSION DATE:  12/09/2017   CHIEF COMPLAINT:   Inability to pass urine.  HISTORY OF PRESENT ILLNESS:        This is a 67 year old diabetic who carries diagnoses of chronic atrial fibrillation, recent pulmonary embolism, and diastolic heart failure.  This past June he was hospitalized for respiratory failure attributed to both pneumonia and pulmonary embolism.  At that time he was also felt to be suffering from diastolic heart failure.  Apparently has had increasing edema recently and his usual dose of Lasix was increased from 4220 mg twice daily and a dose of lisinopril was added.  Today he was unable to pass urine.  He is not had difficulties with urinary hesitancy or frequency in the past and on presentation to the department of emergency medicine he was found to be hypotensive with a creatinine elevated to 5.2.      In addition he does have a leukocytosis with a marginally elevated lactate.  He is denying dysuria, he does report some cough productive for the past 2 weeks and an insidious slight increase in his usual dyspnea.  He has not had any chest pain and he reports good compliance with his medications including Xarelto.  PAST MEDICAL HISTORY :  He  has a past medical history of Acute respiratory failure with hypoxia (HCC), AKI (acute kidney injury) (HCC), Allergy, Altered mental status, Atrial fibrillation (HCC), Chronic anticoagulation (03/26/2017), Colon polyps, Community acquired pneumonia of right lower lobe of lung (HCC), COPD (chronic obstructive pulmonary disease) (HCC), Degenerative disc disease, lumbar, Diabetes mellitus without complication (HCC), History of pulmonary embolus (PE) (03/26/2017), Hypertension, Mitral regurgitation (12/26/2012), Obesity (BMI 30-39.9) (12/09/2012), PAF (paroxysmal atrial fibrillation) (HCC), Persistent atrial fibrillation (HCC), Pneumonia (~ 2016), Sepsis (HCC)  (03/10/2017), Sleep apnea, Systolic murmur, and Type II diabetes mellitus (HCC).  PAST SURGICAL HISTORY: He  has a past surgical history that includes TEE without cardioversion (N/A, 01/09/2013); Cardioversion (N/A, 01/09/2013); and Cardioversion (N/A, 04/13/2017).  No Known Allergies  No current facility-administered medications on file prior to encounter.    Current Outpatient Medications on File Prior to Encounter  Medication Sig  . Dulaglutide (TRULICITY) 1.5 MG/0.5ML SOPN Inject 1.5 mg into the skin once a week.  . fish oil-omega-3 fatty acids 1000 MG capsule Take 1 g by mouth daily.  . furosemide (LASIX) 40 MG tablet Take 2 tablets (80 mg total) by mouth 2 (two) times daily. (Patient taking differently: Take 120 mg by mouth 2 (two) times daily. )  . lisinopril (PRINIVIL,ZESTRIL) 10 MG tablet Take 1 tablet (10 mg total) by mouth daily.  . metolazone (ZAROXOLYN) 5 MG tablet Take 1 pill 30 minutes before lasix on Monday only  . metoprolol tartrate (LOPRESSOR) 50 MG tablet Take 1 tablet (50 mg total) by mouth 2 (two) times daily.  . potassium chloride SA (K-DUR,KLOR-CON) 20 MEQ tablet Take 2 tablets (40 mEq total) by mouth 2 (two) times daily.  . rivaroxaban (XARELTO) 20 MG TABS tablet Take 1 tablet (20 mg total) by mouth daily with supper.  . sitaGLIPtin (JANUVIA) 100 MG tablet Take 1 tablet (100 mg total) daily by mouth.  . umeclidinium-vilanterol (ANORO ELLIPTA) 62.5-25 MCG/INH AEPB Inhale 1 puff into the lungs daily.    FAMILY HISTORY:  His indicated that his mother is deceased. He indicated that his father is deceased. He indicated that only one of his three brothers is alive.  SOCIAL HISTORY: He  reports that he quit smoking about 9 months ago. His smoking use included cigarettes. He has a 43.00 pack-year smoking history. He has never used smokeless tobacco. He reports that he does not drink alcohol or use drugs.  REVIEW OF SYSTEMS:   10 system review of systems is essentially  noncontributory.  He denies a history of any major CNS event, no history of TIAs stroke or seizure.  Cardiovascular denies history of chest pain, he does have chronic atrial fibrillation and apparently was cardioverted a number of years ago and again reverted.  He is having no abdominal discomfort and as noted he has no previous GI symptoms specifically no history of recurrent urinary tract infections associated with his Januvia.  SUBJECTIVE:  As above  VITAL SIGNS: BP (!) 73/56   Pulse 75   Temp (!) 97.5 F (36.4 C) (Oral)   Resp (!) 22   SpO2 99%   HEMODYNAMICS:    VENTILATOR SETTINGS:    INTAKE / OUTPUT: No intake/output data recorded.  PHYSICAL EXAMINATION: General: Patient is in absolutely no distress entirely alert and appropriately interactive. Neuro: He is moving all fours pupils are equal and he is oriented x3 Cardiovascular: Has a thick neck but I do not detect any JVD, S1 and S2 are somewhat distant and irregularly irregular without murmur rub or gallop Lungs: Operations are unlabored there is fairly good air movement throughout a few scattered rhonchi and no wheezes Abdomen: The abdomen is profoundly obese without any organomegaly masses tenderness guarding or rebound Musculoskeletal: Perhaps 1/2+ lower extremity edema   LABS:  BMET Recent Labs  Lab 12/09/17 0951 12/09/17 1012  NA 126* 125*  K 3.3* 3.4*  CL 80* 81*  CO2 28  --   BUN 81* 78*  CREATININE 5.17* 5.20*  GLUCOSE 192* 190*    Electrolytes Recent Labs  Lab 12/09/17 0951  CALCIUM 8.8*    CBC Recent Labs  Lab 12/09/17 0951 12/09/17 1012  WBC 14.7*  --   HGB 14.2 16.0  HCT 42.2 47.0  PLT 320  --     Coag's No results for input(s): APTT, INR in the last 168 hours.  Sepsis Markers Recent Labs  Lab 12/09/17 1012 12/09/17 1323  LATICACIDVEN 2.48* 2.24*    ABG No results for input(s): PHART, PCO2ART, PO2ART in the last 168 hours.  Liver Enzymes Recent Labs  Lab  12/09/17 0951  AST 26  ALT 24  ALKPHOS 59  BILITOT 0.7  ALBUMIN 3.5    Cardiac Enzymes Recent Labs  Lab 12/09/17 0951  TROPONINI 0.05*    Glucose No results for input(s): GLUCAP in the last 168 hours.  Imaging Dg Chest Portable 1 View  Result Date: 12/09/2017 CLINICAL DATA:  Cough and congestion EXAM: PORTABLE CHEST 1 VIEW COMPARISON:  10/15/2017 FINDINGS: Cardiac shadow is mildly prominent. Some slight increased markings are noted in the medial right lung base. No other focal infiltrate is seen. No bony abnormality is noted. IMPRESSION: Increased opacity in the medial right lung base likely representing early infiltrate. Electronically Signed   By: Alcide Clever M.D.   On: 12/09/2017 10:16     STUDIES:  Chest x-ray to my eye shows perhaps a subtle infiltrate in the right lower lobe there is no vascular redistribution.   ANTIBIOTICS: Azithromycin and Rocephin   DISCUSSION:      This is a 67 year old diabetic with a history of chronic atrial fibrillation, diastolic heart failure, history of PE who presented due  to an inability to pass any urine.  He is recently had a substantial increase in his Lasix with the addition of an ACE inhibitor.  He is found to be hypotensive with a creatinine elevated to 5 and a white count of 14,000  ASSESSMENT / PLAN:  PULMONARY A: There is perhaps a subtle infiltrate on chest x-ray, he is not acutely toxic appearing I am not going to empirically add Tamiflu and will continue dosing with a combination of azithromycin and Rocephin for now.  Does have a history of PE and I switched him to unfractionated heparin as Xarelto dosing will be difficult with his renal function  CARDIOVASCULAR A: He is hypotensive and I suspect this is secondary to overdiuresis not secondary to acute sepsis.  I suspect the same etiology for his lactic acidosis.  I am bolusing with a liter of fluid to see if both his lactate and creatinine will improve obviously will be  monitoring his blood pressure as we do so.   RENAL A: As noted I am hopeful that his acute renal insufficiency is secondary to ACE inhibitor and a high dose of Lasix.  Those agents have been held and I am hydrating the patient.      INFECTIOUS A: Azithromycin and Rocephin should cover common respiratory pathogens as well as his urinary tract in the event that obstruction is contributing to it was inability to pass urine however the UA is not suggestive  ENDOCRINE A: Covering his diabetes with sliding scale insulin with an at bedtime dose of Lantus for now    Penny Pia, MD Pulmonary and Critical Care Medicine [facility] Pager: [phone]  12/09/2017, 3:13 PM

## 2017-12-09 NOTE — Telephone Encounter (Signed)
Called and spoke to pt's wife and she states that he has not urinated since 4pm yesterday and she is taking him to the ER. Offered apt here and she stated that he will have to be cathed and she didn't want to come here just to be sent to the hospital. Dr. Tanya Nones aware.

## 2017-12-09 NOTE — ED Provider Notes (Signed)
Emergency Department Provider Note   I have reviewed the triage vital signs and the nursing notes.   HISTORY  Chief Complaint hypotensive and unable to void   HPI William Alvarado is a 67 y.o. male multiple medical problems as documented below who presents for decreased ability to urinate. He is on room air normally however he has had fluctuating weight.  It sounds like his primary doctor had been trying to increase his Lasix to get some fluid off but the last 17 hours patient has had no urine output after multiple days of decreased urine output.  He is tachypneic but no cough.  He feels weak.  No abdominal pain.  No abdominal distention but does have another 5 pounds of weight gain.  On review of records it appears that he was switched from Coumadin to Xarelto.  He has a history of atrial fibrillation in which she is asymptomatic from. No other associated or modifying symptoms.    Past Medical History:  Diagnosis Date  . Acute respiratory failure with hypoxia (HCC)    Hospitalized with acute respiratory failure secondary to CAP, diastolic CHF, and pulmonary embolism  . AKI (acute kidney injury) (HCC)   . Allergy    allergic rhinitis  . Altered mental status   . Atrial fibrillation (HCC)   . Chronic anticoagulation 03/26/2017   Failed Coumadin (PE with therapeutic INR). Now on Xarelto  . Colon polyps   . Community acquired pneumonia of right lower lobe of lung (HCC)   . COPD (chronic obstructive pulmonary disease) (HCC)   . Degenerative disc disease, lumbar   . Diabetes mellitus without complication (HCC)   . History of pulmonary embolus (PE) 03/26/2017  . Hypertension   . Mitral regurgitation 12/26/2012  . Obesity (BMI 30-39.9) 12/09/2012  . PAF (paroxysmal atrial fibrillation) (HCC)    DCCV in 2014 with recurrent PAF in setting of respiratory failure June 2018. S/P DCCV 04/13/17 to NSR  . Persistent atrial fibrillation (HCC)   . Pneumonia ~ 2016  . Sepsis (HCC) 03/10/2017  .  Sleep apnea   . Systolic murmur   . Type II diabetes mellitus [facility])     Patient Active Problem List   Diagnosis Date Noted  . Hypotension 12/09/2017  . Persistent atrial fibrillation (HCC)   . Chronic anticoagulation 03/26/2017  . History of pulmonary embolus (PE) 03/26/2017  . Sepsis (HCC) 03/10/2017  . Acute respiratory failure with hypoxia (HCC)   . AKI (acute kidney injury) (HCC)   . Altered mental status   . PAF (paroxysmal atrial fibrillation) (HCC)   . Community acquired pneumonia of right lower lobe of lung (HCC)   . Mitral regurgitation 12/26/2012  . Obesity (BMI 30-39.9) 12/09/2012  . Hypertension   . Allergy   . Systolic murmur   . Colon polyps   . Diabetes mellitus without complication (HCC)   . Degenerative disc disease, lumbar     Past Surgical History:  Procedure Laterality Date  . CARDIOVERSION N/A 01/09/2013   Procedure: CARDIOVERSION;  Surgeon: Pricilla Riffle, MD;  Location: [facility] ENDOSCOPY;  Service: Cardiovascular;  Laterality: N/A;  . CARDIOVERSION N/A 04/13/2017   Procedure: CARDIOVERSION;  Surgeon: Quintella Reichert, MD;  Location: [facility] ENDOSCOPY;  Service: Cardiovascular;  Laterality: N/A;  . TEE WITHOUT CARDIOVERSION N/A 01/09/2013   Procedure: TRANSESOPHAGEAL ECHOCARDIOGRAM (TEE);  Surgeon: Pricilla Riffle, MD;  Location: [facility] ENDOSCOPY;  Service: Cardiovascular;  Laterality: N/A;    Current Outpatient Rx  . Order #: [id number] Class:  Normal  . Order #: [id number] Class: Historical Med  . Order #: [id number] Class: No Print  . Order #: [id number] Class: Normal  . Order #: [id number] Class: No Print  . Order #: [id number] Class: Normal  . Order #: [id number] Class: Normal  . Order #: [id number] Class: Normal  . Order #: [id number] Class: Normal  . Order #: [id number] Class: Historical Med    Allergies Patient has no known allergies.  Family History  Problem Relation Age of Onset  . Cancer Mother 39       breast  . Heart disease Father 39       MI  . Cancer Brother         lung  . Stroke Brother 22    Social History Social History   Tobacco Use  . Smoking status: Former Smoker    Packs/day: 1.00    Years: 43.00    Pack years: 43.00    Types: Cigarettes    Last attempt to quit: 03/08/2017    Years since quitting: 0.7  . Smokeless tobacco: Never Used  Substance Use Topics  . Alcohol use: No    Comment: Rare  . Drug use: No    Review of Systems  All other systems negative except as documented in the HPI. All pertinent positives and negatives as reviewed in the HPI. ____________________________________________   PHYSICAL EXAM:  VITAL SIGNS: ED Triage Vitals  Enc Vitals Group     BP 12/09/17 0919 (!) 69/39     Pulse Rate 12/09/17 0919 96     Resp 12/09/17 0919 20     Temp 12/09/17 0919 (!) 97.5 F (36.4 C)     Temp Source 12/09/17 0919 Oral     SpO2 12/09/17 0919 (!) 77 %    Constitutional: Alert and oriented but sleepy. Well appearing and in no acute distress. Eyes: Conjunctivae are normal. PERRL. EOMI. Head: Atraumatic. Nose: No congestion/rhinnorhea. Mouth/Throat: Mucous membranes are moist.  Oropharynx non-erythematous. Neck: No stridor.  No meningeal signs.   Cardiovascular: irregular, mildly tachycardic rate, hypotensive, regular rhythm. Good peripheral circulation. Grossly normal heart sounds.   Respiratory: tachypneic, hypoxic respiratory effort.  No retractions. Lungs significantly diminished in RLL. Gastrointestinal: Soft and nontender. Mild distention, no obvious fluid wave.  Musculoskeletal: No lower extremity tenderness nor edema. No gross deformities of extremities. Neurologic:  Normal speech and language. No gross focal neurologic deficits are appreciated.  Skin:  Skin is warm, dry and intact. No rash noted.   ____________________________________________   LABS (all labs ordered are listed, but only abnormal results are displayed)  Labs Reviewed  COMPREHENSIVE METABOLIC PANEL - Abnormal; Notable for the following  components:      Result Value   Sodium 126 (*)    Potassium 3.3 (*)    Chloride 80 (*)    Glucose, Bld 192 (*)    BUN 81 (*)    Creatinine, Ser 5.17 (*)    Calcium 8.8 (*)    GFR calc non Af Amer 11 (*)    GFR calc Af Amer 12 (*)    Anion gap 18 (*)    All other components within normal limits  TROPONIN I - Abnormal; Notable for the following components:   Troponin I 0.05 (*)    All other components within normal limits  CBC WITH DIFFERENTIAL/PLATELET - Abnormal; Notable for the following components:   WBC 14.7 (*)    Neutro Abs 11.3 (*)    All other components within normal limits  I-STAT CHEM 8, ED - Abnormal;  Notable for the following components:   Sodium 125 (*)    Potassium 3.4 (*)    Chloride 81 (*)    BUN 78 (*)    Creatinine, Ser 5.20 (*)    Glucose, Bld 190 (*)    Calcium, Ion 1.01 (*)    All other components within normal limits  I-STAT CG4 LACTIC ACID, ED - Abnormal; Notable for the following components:   Lactic Acid, Venous 2.48 (*)    All other components within normal limits  I-STAT VENOUS BLOOD GAS, ED - Abnormal; Notable for the following components:   Bicarbonate 33.4 (*)    TCO2 35 (*)    Acid-Base Excess 6.0 (*)    All other components within normal limits  I-STAT CG4 LACTIC ACID, ED - Abnormal; Notable for the following components:   Lactic Acid, Venous 2.24 (*)    All other components within normal limits  CULTURE, BLOOD (ROUTINE X 2)  CULTURE, BLOOD (ROUTINE X 2)  URINE CULTURE  BRAIN NATRIURETIC PEPTIDE  BLOOD GAS, VENOUS  URINALYSIS, ROUTINE W REFLEX MICROSCOPIC  HEMOGLOBIN A1C  APTT  HEPARIN LEVEL (UNFRACTIONATED)  APTT  HEPARIN LEVEL (UNFRACTIONATED)   ____________________________________________  EKG   EKG Interpretation  Date/Time:  Thursday December 09 2017 09:24:38 EDT Ventricular Rate:  84 PR Interval:    QRS Duration: 82 QT Interval:  446 QTC Calculation: 527 R Axis:   61 Text Interpretation:  Atrial fibrillation with  premature ventricular or aberrantly conducted complexes Low voltage QRS Cannot rule out Anteroseptal infarct , age undetermined Prolonged QT Abnormal ECG Afib new since july 2018 Confirmed by Marily Memos [id number]) on 12/09/2017 9:42:22 AM       ____________________________________________  RADIOLOGY  Dg Chest Portable 1 View  Result Date: 12/09/2017 CLINICAL DATA:  Cough and congestion EXAM: PORTABLE CHEST 1 VIEW COMPARISON:  10/15/2017 FINDINGS: Cardiac shadow is mildly prominent. Some slight increased markings are noted in the medial right lung base. No other focal infiltrate is seen. No bony abnormality is noted. IMPRESSION: Increased opacity in the medial right lung base likely representing early infiltrate. Electronically Signed   By: Alcide Clever M.D.   On: 12/09/2017 10:16    ____________________________________________   PROCEDURES  Procedure(s) performed:   Procedures  CRITICAL CARE Performed by: Marily Memos Total critical care time: 45 minutes Critical care time was exclusive of separately billable procedures and treating other patients. Critical care was necessary to treat or prevent imminent or life-threatening deterioration. Critical care was time spent personally by me on the following activities: development of treatment plan with patient and/or surrogate as well as nursing, discussions with consultants, evaluation of patient's response to treatment, examination of patient, obtaining history from patient or surrogate, ordering and performing treatments and interventions, ordering and review of laboratory studies, ordering and review of radiographic studies, pulse oximetry and re-evaluation of patient's condition.  ____________________________________________   INITIAL IMPRESSION / ASSESSMENT AND PLAN / ED COURSE  67 year old male here with community acquired pneumonia and hypovolemia.  He has hypotensive however in the setting of warm extremities, good cap refill,  good mental status and over use of Lasix I do not think this is septic shock rather hypovolemic shock in the setting of pneumonia.  I feel like he has probably had pneumonia this whole time with a cough and shortness of breath and was being inappropriately treated with increased doses of diuretics.  We will continue to fluid resuscitate him and discussed with critical care about admission for further observation  as he does have a history of congestive heart failure so is at risk for pulmonary edema but the same time may need to be started on pressors if his blood pressures do not improve with fluid resuscitation.     Pertinent labs & imaging results that were available during my care of the patient were reviewed by me and considered in my medical decision making (see chart for details).  ____________________________________________  FINAL CLINICAL IMPRESSION(S) / ED DIAGNOSES  Final diagnoses:  Hypotensive episode  Hypovolemia  Shock (HCC)  Acute renal failure, unspecified acute renal failure type (HCC)  Community acquired pneumonia of right middle lobe of lung (HCC)     MEDICATIONS GIVEN DURING THIS VISIT:  Medications  sodium chloride 0.9 % bolus 1,000 mL (1,000 mLs Intravenous New Bag/Given 12/09/17 1506)  insulin glargine (LANTUS) injection 10 Units (has no administration in time range)  insulin aspart (novoLOG) injection 0-15 Units (has no administration in time range)  0.9 %  sodium chloride infusion (has no administration in time range)  acetaminophen (TYLENOL) tablet 650 mg (has no administration in time range)  heparin ADULT infusion 100 units/mL (25000 units/27mL sodium chloride 0.45%) (has no administration in time range)  cefTRIAXone (ROCEPHIN) 1 g in sodium chloride 0.9 % 100 mL IVPB (0 g Intravenous Stopped 12/09/17 1242)  azithromycin (ZITHROMAX) 500 mg in sodium chloride 0.9 % 250 mL IVPB (0 mg Intravenous Stopped 12/09/17 1340)  sodium chloride 0.9 % bolus 500 mL (0 mLs  Intravenous Stopped 12/09/17 1303)  sodium chloride 0.9 % bolus 1,000 mL (0 mLs Intravenous Stopped 12/09/17 1413)  sodium chloride 0.9 % bolus 1,000 mL (1,000 mLs Intravenous New Bag/Given 12/09/17 1216)  sodium chloride 0.9 % bolus 1,000 mL (0 mLs Intravenous Stopped 12/09/17 1503)  sodium chloride 0.9 % bolus 500 mL (0 mLs Intravenous Stopped 12/09/17 1333)     NEW OUTPATIENT MEDICATIONS STARTED DURING THIS VISIT:  New Prescriptions   No medications on file    Note:  This note was prepared with assistance of Dragon voice recognition software. Occasional wrong-word or sound-a-like substitutions may have occurred due to the inherent limitations of voice recognition software.   Marily Memos, MD 12/09/17 [phone]

## 2017-12-09 NOTE — ED Triage Notes (Signed)
Pt to ED for being unable to void. During triage noted pt's sats in the 70's on RA and hypotensive. Pt states his pcp recently increased his 'fluid pill'. Appears ashen in color. Complains of lower abd pain. Denies n/v. Denies fevers. Abd tight. Alert and oriented

## 2017-12-09 NOTE — Progress Notes (Signed)
ANTICOAGULATION CONSULT NOTE - Initial Consult  Pharmacy Consult for heparin Indication: atrial fibrillation  No Known Allergies  Patient Measurements:  Heparin Dosing Weight: 111.8 kg  Vital Signs: Temp: 97.5 F (36.4 C) (03/21 0919) Temp Source: Oral (03/21 0919) BP: 73/56 (03/21 1500) Pulse Rate: 75 (03/21 1500)  Labs: Recent Labs    12/09/17 0951 12/09/17 1012  HGB 14.2 16.0  HCT 42.2 47.0  PLT 320  --   CREATININE 5.17* 5.20*  TROPONINI 0.05*  --     Estimated Creatinine Clearance: 21.1 mL/min (A) (by C-G formula based on SCr of 5.2 mg/dL (H)).   Medical History: Past Medical History:  Diagnosis Date  . Acute respiratory failure with hypoxia (HCC)    Hospitalized with acute respiratory failure secondary to CAP, diastolic CHF, and pulmonary embolism  . AKI (acute kidney injury) (HCC)   . Allergy    allergic rhinitis  . Altered mental status   . Atrial fibrillation (HCC)   . Chronic anticoagulation 03/26/2017   Failed Coumadin (PE with therapeutic INR). Now on Xarelto  . Colon polyps   . Community acquired pneumonia of right lower lobe of lung (HCC)   . COPD (chronic obstructive pulmonary disease) (HCC)   . Degenerative disc disease, lumbar   . Diabetes mellitus without complication (HCC)   . History of pulmonary embolus (PE) 03/26/2017  . Hypertension   . Mitral regurgitation 12/26/2012  . Obesity (BMI 30-39.9) 12/09/2012  . PAF (paroxysmal atrial fibrillation) (HCC)    DCCV in 2014 with recurrent PAF in setting of respiratory failure June 2018. S/P DCCV 04/13/17 to NSR  . Persistent atrial fibrillation (HCC)   . Pneumonia ~ 2016  . Sepsis (HCC) 03/10/2017  . Sleep apnea   . Systolic murmur   . Type II diabetes mellitus (HCC)     Medications:   (Not in a hospital admission)  Assessment: 67 yo man to start heparin for afib.  He was on xarelto PTA.  Last dose 3/20 @ 19:30 Goal of Therapy:  Heparin level 0.3-0.7 units/ml APTT 66-102 sec Monitor  platelets by anticoagulation protocol: Yes   Plan:  Check baseline aPTT and heparin level Start heparin drip at 1700 units/hr at 19:30 with no bolus Check heparin level and aPTT 6-8 hours after start and daily while on heparin  Talbert Cage P[place]et 12/09/2017,3:07 PM

## 2017-12-10 ENCOUNTER — Other Ambulatory Visit: Payer: Self-pay

## 2017-12-10 ENCOUNTER — Ambulatory Visit: Payer: BLUE CROSS/BLUE SHIELD | Admitting: Family Medicine

## 2017-12-10 ENCOUNTER — Encounter (HOSPITAL_COMMUNITY): Payer: Self-pay | Admitting: *Deleted

## 2017-12-10 DIAGNOSIS — J189 Pneumonia, unspecified organism: Secondary | ICD-10-CM

## 2017-12-10 DIAGNOSIS — N179 Acute kidney failure, unspecified: Secondary | ICD-10-CM

## 2017-12-10 DIAGNOSIS — R5381 Other malaise: Secondary | ICD-10-CM

## 2017-12-10 DIAGNOSIS — R579 Shock, unspecified: Secondary | ICD-10-CM

## 2017-12-10 LAB — CBC WITH DIFFERENTIAL/PLATELET
BASOS ABS: 0 10*3/uL (ref 0.0–0.1)
BASOS PCT: 0 %
EOS PCT: 2 %
Eosinophils Absolute: 0.2 10*3/uL (ref 0.0–0.7)
HCT: 38.7 % — ABNORMAL LOW (ref 39.0–52.0)
Hemoglobin: 12.5 g/dL — ABNORMAL LOW (ref 13.0–17.0)
Lymphocytes Relative: 23 %
Lymphs Abs: 2.3 10*3/uL (ref 0.7–4.0)
MCH: 29.2 pg (ref 26.0–34.0)
MCHC: 32.3 g/dL (ref 30.0–36.0)
MCV: 90.4 fL (ref 78.0–100.0)
MONO ABS: 0.7 10*3/uL (ref 0.1–1.0)
Monocytes Relative: 7 %
NEUTROS ABS: 6.6 10*3/uL (ref 1.7–7.7)
Neutrophils Relative %: 68 %
Platelets: 253 10*3/uL (ref 150–400)
RBC: 4.28 MIL/uL (ref 4.22–5.81)
RDW: 15.8 % — AB (ref 11.5–15.5)
WBC: 9.7 10*3/uL (ref 4.0–10.5)

## 2017-12-10 LAB — HEPARIN LEVEL (UNFRACTIONATED)
HEPARIN UNFRACTIONATED: 0.99 [IU]/mL — AB (ref 0.30–0.70)
Heparin Unfractionated: 1.36 IU/mL — ABNORMAL HIGH (ref 0.30–0.70)

## 2017-12-10 LAB — BASIC METABOLIC PANEL
ANION GAP: 13 (ref 5–15)
BUN: 75 mg/dL — ABNORMAL HIGH (ref 6–20)
CALCIUM: 7.6 mg/dL — AB (ref 8.9–10.3)
CO2: 25 mmol/L (ref 22–32)
Chloride: 91 mmol/L — ABNORMAL LOW (ref 101–111)
Creatinine, Ser: 4.47 mg/dL — ABNORMAL HIGH (ref 0.61–1.24)
GFR calc non Af Amer: 13 mL/min — ABNORMAL LOW (ref >60)
GFR, EST AFRICAN AMERICAN: 15 mL/min — AB (ref >60)
Glucose, Bld: 116 mg/dL — ABNORMAL HIGH (ref 65–99)
Potassium: 3.6 mmol/L (ref 3.5–5.1)
SODIUM: 129 mmol/L — AB (ref 135–145)

## 2017-12-10 LAB — PROTIME-INR
INR: 1.22
Prothrombin Time: 15.3 seconds — ABNORMAL HIGH (ref 11.4–15.2)

## 2017-12-10 LAB — GLUCOSE, CAPILLARY
GLUCOSE-CAPILLARY: 143 mg/dL — AB (ref 65–99)
Glucose-Capillary: 97 mg/dL (ref 65–99)
Glucose-Capillary: 147 mg/dL — ABNORMAL HIGH (ref 65–99)
Glucose-Capillary: 138 mg/dL — ABNORMAL HIGH (ref 65–99)

## 2017-12-10 LAB — APTT
APTT: 80 s — AB (ref 24–36)
aPTT: 69 seconds — ABNORMAL HIGH (ref 24–36)

## 2017-12-10 LAB — HEMOGLOBIN A1C
HEMOGLOBIN A1C: 8.3 % — AB (ref 4.8–5.6)
MEAN PLASMA GLUCOSE: 191.51 mg/dL

## 2017-12-10 MED ORDER — DOCUSATE SODIUM 100 MG PO CAPS
100.0000 mg | ORAL_CAPSULE | Freq: Two times a day (BID) | ORAL | Status: DC
  Administered 2017-12-10 – 2017-12-12 (×2): 100 mg via ORAL
  Filled 2017-12-10 (×4): qty 1

## 2017-12-10 MED ORDER — MAGNESIUM HYDROXIDE 400 MG/5ML PO SUSP
30.0000 mL | Freq: Once | ORAL | Status: AC
  Administered 2017-12-10: 30 mL via ORAL
  Filled 2017-12-10: qty 30

## 2017-12-10 NOTE — Progress Notes (Signed)
ANTICOAGULATION CONSULT NOTE   Pharmacy Consult for heparin Indication: atrial fibrillation  No Known Allergies  Patient Measurements: Height: 6\' 1"  (185.4 cm) Weight: (!) 319 lb 7.1 oz (144.9 kg) IBW/kg (Calculated) : 79.9Heparin Dosing Weight: 111.8 kg  Vital Signs: Temp: 97.6 F (36.4 C) (03/22 0729) Temp Source: Oral (03/22 0729) BP: 130/80 (03/22 1142) Pulse Rate: 77 (03/22 1142)  Labs: Recent Labs    12/09/17 0951 12/09/17 1012 12/09/17 1750 12/10/17 0312 12/10/17 1054  HGB 14.2 16.0  --  12.5*  --   HCT 42.2 47.0  --  38.7*  --   PLT 320  --   --  253  --   APTT  --   --  45* 80* 69*  LABPROT  --   --   --  15.3*  --   INR  --   --   --  1.22  --   HEPARINUNFRC  --   --  1.36* 0.99*  --   CREATININE 5.17* 5.20*  --  4.47*  --   TROPONINI 0.05*  --   --   --   --     Estimated Creatinine Clearance: 25 mL/min (A) (by C-G formula based on SCr of 4.47 mg/dL (H)).   Medical History: Past Medical History:  Diagnosis Date  . Acute respiratory failure with hypoxia (HCC)    Hospitalized with acute respiratory failure secondary to CAP, diastolic CHF, and pulmonary embolism  . AKI (acute kidney injury) (HCC)   . Allergy    allergic rhinitis  . Altered mental status   . Atrial fibrillation (HCC)   . Chronic anticoagulation 03/26/2017   Failed Coumadin (PE with therapeutic INR). Now on Xarelto  . Colon polyps   . Community acquired pneumonia of right lower lobe of lung (HCC)   . COPD (chronic obstructive pulmonary disease) (HCC)   . Degenerative disc disease, lumbar   . Diabetes mellitus without complication (HCC)   . History of pulmonary embolus (PE) 03/26/2017  . Hypertension   . Mitral regurgitation 12/26/2012  . Obesity (BMI 30-39.9) 12/09/2012  . PAF (paroxysmal atrial fibrillation) (HCC)    DCCV in 2014 with recurrent PAF in setting of respiratory failure June 2018. S/P DCCV 04/13/17 to NSR  . Persistent atrial fibrillation (HCC)   . Pneumonia ~ 2016  .  Sepsis (HCC) 03/10/2017  . Sleep apnea   . Systolic murmur   . Type II diabetes mellitus (HCC)     Medications:  Medications Prior to Admission  Medication Sig Dispense Refill Last Dose  . Dulaglutide (TRULICITY) 1.5 MG/0.5ML SOPN Inject 1.5 mg into the skin once a week. 2 mL 3 Past Week at Unknown time  . fish oil-omega-3 fatty acids 1000 MG capsule Take 1 g by mouth daily.   12/08/2017 at Unknown time  . furosemide (LASIX) 40 MG tablet Take 2 tablets (80 mg total) by mouth 2 (two) times daily. (Patient taking differently: Take 120 mg by mouth 2 (two) times daily. ) 180 tablet 3 12/08/2017 at Unknown time  . lisinopril (PRINIVIL,ZESTRIL) 10 MG tablet Take 1 tablet (10 mg total) by mouth daily. 90 tablet 3 12/08/2017 at Unknown time  . metolazone (ZAROXOLYN) 5 MG tablet Take 1 pill 30 minutes before lasix on Monday only 30 tablet 0 12/06/2017 at Unknown time  . metoprolol tartrate (LOPRESSOR) 50 MG tablet Take 1 tablet (50 mg total) by mouth 2 (two) times daily. 60 tablet 11 12/09/2017 at 8;30p  . potassium chloride SA (  K-DUR,KLOR-CON) 20 MEQ tablet Take 2 tablets (40 mEq total) by mouth 2 (two) times daily. 360 tablet 3 12/09/2017 at Unknown time  . rivaroxaban (XARELTO) 20 MG TABS tablet Take 1 tablet (20 mg total) by mouth daily with supper. 90 tablet 3 12/08/2017 at 730p  . sitaGLIPtin (JANUVIA) 100 MG tablet Take 1 tablet (100 mg total) daily by mouth. 30 tablet 3 12/08/2017 at Unknown time  . umeclidinium-vilanterol (ANORO ELLIPTA) 62.5-25 MCG/INH AEPB Inhale 1 puff into the lungs daily.   12/08/2017 at Unknown time    Assessment: 67 yo man on heparin for afib.  He was on xarelto PTA not on hold due to AKI (Last dose 3/20 @ 19:30).  -aPTT is at goal on 1700 units/hr  Goal of Therapy:  Heparin level 0.3-0.7 units/ml APTT 66-102 sec Monitor platelets by anticoagulation protocol: Yes   Plan:  -no heparin changed needed -Daily heparin level, aPTT and CBC  Harland German, PharmD Clinical  Pharmacist Clinical phone from 8:30-4:00 is [phone] After 4pm, please call Main Rx [phone]) for assistance. 12/10/2017 12:01 PM

## 2017-12-10 NOTE — Progress Notes (Signed)
ANTICOAGULATION CONSULT NOTE - Follow Up Consult  Pharmacy Consult for heparin Indication: atrial fibrillation  Labs: Recent Labs    12/09/17 0951 12/09/17 1012 12/09/17 1750 12/10/17 0312  HGB 14.2 16.0  --  12.5*  HCT 42.2 47.0  --  38.7*  PLT 320  --   --  253  APTT  --   --  45* 80*  LABPROT  --   --   --  15.3*  INR  --   --   --  1.22  HEPARINUNFRC  --   --  1.36* 0.99*  CREATININE 5.17* 5.20*  --  4.47*  TROPONINI 0.05*  --   --   --     Assessment/Plan:  66yo male therapeutic on heparin with initial dosing while Xarelto held. Will continue gtt at current rate and confirm stable with additional PTT.   Vernard Gambles, PharmD, BCPS  12/10/2017,4:19 AM

## 2017-12-10 NOTE — Progress Notes (Signed)
Pt has had (2) 5 beat runs of VT. He does not feel it, asymptomatic. RN will continue to monitor.

## 2017-12-10 NOTE — Progress Notes (Signed)
PULMONARY / CRITICAL CARE MEDICINE   Name: GWEN EDLER MRN: [id number] DOB: [birth year]    ADMISSION DATE:  12/09/2017   CHIEF COMPLAINT:   Inability to pass urine.  HISTORY OF PRESENT ILLNESS:        This is a 67 year old diabetic who carries diagnoses of chronic atrial fibrillation, recent pulmonary embolism, and diastolic heart failure.  This past June he was hospitalized for respiratory failure attributed to both pneumonia and pulmonary embolism.  At that time he was also felt to be suffering from diastolic heart failure.  Apparently has had increasing edema recently and his usual dose of Lasix was increased from 4220 mg twice daily and a dose of lisinopril was added.  Today he was unable to pass urine.  He is not had difficulties with urinary hesitancy or frequency in the past and on presentation to the department of emergency medicine he was found to be hypotensive with a creatinine elevated to 5.2.      In addition he does have a leukocytosis with a marginally elevated lactate.  He is denying dysuria, he does report some cough productive for the past 2 weeks and an insidious slight increase in his usual dyspnea.  He has not had any chest pain and he reports good compliance with his medications including Xarelto.  SUBJECTIVE:  No events overnight, feels much better this AM  VITAL SIGNS: BP 113/78   Pulse 77   Temp 97.6 F (36.4 C) (Oral)   Resp (!) 23   Ht 6\' 1"  (1.854 m)   Wt (!) 319 lb 7.1 oz (144.9 kg)   SpO2 96%   BMI 42.15 kg/m   HEMODYNAMICS:    VENTILATOR SETTINGS:    INTAKE / OUTPUT: I/O last 3 completed shifts: In: 7178 [I.V.:328; IV Piggyback:6850] Out: 2025 [Urine:2025]  PHYSICAL EXAMINATION: General: Well appearing, NAD Neuro: Alert and interactive, moving all ext to command Cardiovascular: RRR, Nl S1/S2 and -M/R/G. Lungs: CTA bilaterally Abdomen: Soft, NT, ND and +BS Musculoskeletal: no edema and no tenderness Skin: intact  LABS:  BMET Recent  Labs  Lab 12/09/17 0951 12/09/17 1012 12/10/17 0312  NA 126* 125* 129*  K 3.3* 3.4* 3.6  CL 80* 81* 91*  CO2 28  --  25  BUN 81* 78* 75*  CREATININE 5.17* 5.20* 4.47*  GLUCOSE 192* 190* 116*   Electrolytes Recent Labs  Lab 12/09/17 0951 12/10/17 0312  CALCIUM 8.8* 7.6*   CBC Recent Labs  Lab 12/09/17 0951 12/09/17 1012 12/10/17 0312  WBC 14.7*  --  9.7  HGB 14.2 16.0 12.5*  HCT 42.2 47.0 38.7*  PLT 320  --  253   Coag's Recent Labs  Lab 12/09/17 1750 12/10/17 0312  APTT 45* 80*  INR  --  1.22   Sepsis Markers Recent Labs  Lab 12/09/17 1012 12/09/17 1323  LATICACIDVEN 2.48* 2.24*   ABG No results for input(s): PHART, PCO2ART, PO2ART in the last 168 hours.  Liver Enzymes Recent Labs  Lab 12/09/17 0951  AST 26  ALT 24  ALKPHOS 59  BILITOT 0.7  ALBUMIN 3.5   Cardiac Enzymes Recent Labs  Lab 12/09/17 0951  TROPONINI 0.05*   Glucose Recent Labs  Lab 12/09/17 1759 12/09/17 2153 12/10/17 0731  GLUCAP 115* 116* 97   Imaging I reviewed CXR myself, no acute disease noted  STUDIES:    ANTIBIOTICS: Azithromycin and Rocephin d/c 3/22  DISCUSSION:      This is a 67 year old diabetic with a history of chronic  atrial fibrillation, diastolic heart failure, history of PE who presented due to an inability to pass any urine.  He is recently had a substantial increase in his Lasix with the addition of an ACE inhibitor.  He is found to be hypotensive with a creatinine elevated to 5 and a white count of 14,000  ASSESSMENT / PLAN:  PULMONARY A: Concern for PNA and septic shock  - D/C abx  - D/C tamiflu  - Ambulate  CARDIOVASCULAR A: Hypovolemic shock  - KVO IVF  - Hold lasix  - Hold anti-HTN  RENAL A: Poor due to dehydration but improve  - Hold lasix  - BMET in AM  - Replace electrolytes as indicated  INFECTIOUS A: WBC normal, no fever, PCT 2, no signs of infection  - D/C abx  - Monitor clinically  ENDOCRINE A: DM  - CBG  -  ISS  Transfer to tele and to [facility] service with PCCM off 3/23  Discussed with PCCM-NP and TRH-MD  Alyson Reedy, M.D. [facility] Pulmonary/Critical Care Medicine. Pager: [phone]. After hours pager: [phone].  12/10/2017, 10:17 AM

## 2017-12-11 DIAGNOSIS — E861 Hypovolemia: Secondary | ICD-10-CM

## 2017-12-11 DIAGNOSIS — I959 Hypotension, unspecified: Secondary | ICD-10-CM

## 2017-12-11 LAB — BASIC METABOLIC PANEL
ANION GAP: 13 (ref 5–15)
BUN: 63 mg/dL — ABNORMAL HIGH (ref 6–20)
CHLORIDE: 91 mmol/L — AB (ref 101–111)
CO2: 25 mmol/L (ref 22–32)
Calcium: 8.1 mg/dL — ABNORMAL LOW (ref 8.9–10.3)
Creatinine, Ser: 3.29 mg/dL — ABNORMAL HIGH (ref 0.61–1.24)
GFR calc Af Amer: 21 mL/min — ABNORMAL LOW (ref >60)
GFR calc non Af Amer: 18 mL/min — ABNORMAL LOW (ref >60)
GLUCOSE: 127 mg/dL — AB (ref 65–99)
POTASSIUM: 3.1 mmol/L — AB (ref 3.5–5.1)
Sodium: 129 mmol/L — ABNORMAL LOW (ref 135–145)

## 2017-12-11 LAB — HEPARIN LEVEL (UNFRACTIONATED): Heparin Unfractionated: 0.44 IU/mL (ref 0.30–0.70)

## 2017-12-11 LAB — CBC
HEMATOCRIT: 39.2 % (ref 39.0–52.0)
HEMOGLOBIN: 12.6 g/dL — AB (ref 13.0–17.0)
MCH: 29.4 pg (ref 26.0–34.0)
MCHC: 32.1 g/dL (ref 30.0–36.0)
MCV: 91.4 fL (ref 78.0–100.0)
Platelets: 249 10*3/uL (ref 150–400)
RBC: 4.29 MIL/uL (ref 4.22–5.81)
RDW: 16 % — AB (ref 11.5–15.5)
WBC: 7.7 10*3/uL (ref 4.0–10.5)

## 2017-12-11 LAB — GLUCOSE, CAPILLARY
GLUCOSE-CAPILLARY: 161 mg/dL — AB (ref 65–99)
GLUCOSE-CAPILLARY: 156 mg/dL — AB (ref 65–99)
GLUCOSE-CAPILLARY: 122 mg/dL — AB (ref 65–99)
Glucose-Capillary: 181 mg/dL — ABNORMAL HIGH (ref 65–99)

## 2017-12-11 LAB — MAGNESIUM: Magnesium: 2.7 mg/dL — ABNORMAL HIGH (ref 1.7–2.4)

## 2017-12-11 LAB — PHOSPHORUS: Phosphorus: 4.1 mg/dL (ref 2.5–4.6)

## 2017-12-11 LAB — APTT: aPTT: 83 seconds — ABNORMAL HIGH (ref 24–36)

## 2017-12-11 MED ORDER — POTASSIUM CHLORIDE CRYS ER 20 MEQ PO TBCR
40.0000 meq | EXTENDED_RELEASE_TABLET | Freq: Once | ORAL | Status: AC
  Administered 2017-12-11: 40 meq via ORAL
  Filled 2017-12-11: qty 2

## 2017-12-11 MED ORDER — METOPROLOL TARTRATE 50 MG PO TABS
50.0000 mg | ORAL_TABLET | Freq: Two times a day (BID) | ORAL | Status: DC
  Administered 2017-12-11 – 2017-12-12 (×2): 50 mg via ORAL
  Filled 2017-12-11 (×3): qty 1

## 2017-12-11 MED ORDER — DIPHENHYDRAMINE HCL 25 MG PO CAPS
25.0000 mg | ORAL_CAPSULE | Freq: Once | ORAL | Status: AC
  Administered 2017-12-11: 25 mg via ORAL
  Filled 2017-12-11: qty 1

## 2017-12-11 MED ORDER — RIVAROXABAN 15 MG PO TABS
15.0000 mg | ORAL_TABLET | Freq: Every day | ORAL | Status: DC
  Administered 2017-12-11: 15 mg via ORAL
  Filled 2017-12-11: qty 1

## 2017-12-11 MED ORDER — HYDRALAZINE HCL 20 MG/ML IJ SOLN: 10.0000 mg | Freq: Three times a day (TID) | INTRAMUSCULAR | Status: DC | PRN

## 2017-12-11 NOTE — Progress Notes (Signed)
Patient heart rate down to 33 not sustained, and then a few minutes later down to 36, not sustained.  HR a-fib 50s-70s.  Patient asymptomatic and sitting in the chair.  Dr. Sharolyn Douglas paged.  Will continue to monitor.

## 2017-12-11 NOTE — Progress Notes (Signed)
Report called to Lauren RN  on 3 E

## 2017-12-11 NOTE — Progress Notes (Signed)
Transferred to 3 E via w/c with telemetry.tolerated well.

## 2017-12-11 NOTE — Plan of Care (Signed)
Moving along the continuum of care. On room air, urine output good. Watching creatinine. Ambulating with assistance and tolerating. Known OSA< will not wear CPAP athome ( cannot tolerate nasal even) . In Atrial fibrillation, on xaralto. Heparin drip d/c. Plan home in the next couple of days.

## 2017-12-11 NOTE — Progress Notes (Signed)
PROGRESS NOTE  William Alvarado GNF:[id number] DOB: 11/26/1950 DOA: 12/09/2017 PCP: Donita Brooks, MD  HPI/Recap of past 20 hours: 67 year old male with a history of chronic atrial fibrillation, diastolic heart failure, history of PE in 6/18, DM type 2, morbid obesity presented to the ED due to an inability to pass any urine. Pt recently had a substantial increase in his Lasix with the addition of an ACE inhibitor. In the ED, pt was noted to be hypotensive with a creatinine elevated to 5 from baseline and a white count of 14,000 with marginally elevated LA. patient also complained of some cough with mild shortness of breath.  There was a concern for hypovolemic Vs septic shock in the setting of acute renal failure.  Pt was admitted to the ICU. In the ICU all diuretics/BP meds were held, with improvement in BP, pt currently making urine with improving renal fxn, although not at baseline. Pt transferred to tele, with TRH assuming care on 12/11/17  Today, patient reported overall improvement, complained about not being comfortable on the bed/chair.  Denied any chest pain, worsening shortness of breath.  Reported minimal increase in bilateral lower extremity swelling.  Currently making urine.  Assessment/Plan: Active Problems:   Hypotension  AKI on CKD stage 3 Improving, creatinine ~5 on admission, baseline around 1.5-1.8 Serum creatinine improving, making good urine Continue to hold Lasix, lisinopril Strict I's and O's Daily BMP  Hypotension Resolved Afebrile, which resolved leukocytosis Likely due to hypovolemic shock, unlikely sepsis LA 2.4 on admission, trending down likely due to dehydration Chest x-ray showed, possible early infiltrate in the middle right lung Antibiotics started and stopped in the ICU Monitor closely  Hyponatremia Improving Likely due to hypovolemia Management as above  Type II DM A1c 8.3 SSI, Lantus 10 units at bedtime Hold home  Januvia  Hypertension Stable Continue metoprolol, hydralazine IV as needed Hold lisinopril, Lasix/metolazone  History of PE/chronic A. Fib Continue metoprolol Continue Xarelto  Diastolic heart failure Currently hypovolemic, although improving Echo in 6/18 showed EF of 55, technically insufficient for evaluation of LV diastolic function Continue to hold Lasix, metolazone  Morbid obesity     Code Status: Full  Family Communication: Spoke to wife at bedside  Disposition Plan: Home once renal function close to baseline   Consultants:  PCCM  Procedures:  None  Antimicrobials:  None  DVT prophylaxis: Xarelto   Objective: Vitals:   12/11/17 0600 12/11/17 0743 12/11/17 0800 12/11/17 1147  BP: (!) 142/95  (!) 133/107   Pulse: 85  73   Resp: 18  17   Temp:  97.8 F (36.6 C)  (!) 97.5 F (36.4 C)  TempSrc:  Oral  Oral  SpO2: 94%  95%   Weight: (!) 141.8 kg (312 lb 9.8 oz)     Height:        Intake/Output Summary (Last 24 hours) at 12/11/2017 1151 Last data filed at 12/11/2017 1000 Gross per 24 hour  Intake 734 ml  Output 4250 ml  Net -3516 ml   Filed Weights   12/09/17 1745 12/10/17 0400 12/11/17 0600  Weight: (!) 139.7 kg (307 lb 15.7 oz) (!) 144.9 kg (319 lb 7.1 oz) (!) 141.8 kg (312 lb 9.8 oz)    Exam:   General: Not in acute distress  Cardiovascular: S1, S2 present  Respiratory: Diminished breath sounds bilaterally  Abdomen: Obese, soft, nontender, bowel sounds present  Musculoskeletal: Trace pedal edema bilaterally, venous stasis changes noted bilaterally  Skin: Venous stasis changes noted  bilaterally  Psychiatry: Normal mood   Data Reviewed: CBC: Recent Labs  Lab 12/09/17 0951 12/09/17 1012 12/10/17 0312 12/11/17 0406  WBC 14.7*  --  9.7 7.7  NEUTROABS 11.3*  --  6.6  --   HGB 14.2 16.0 12.5* 12.6*  HCT 42.2 47.0 38.7* 39.2  MCV 89.6  --  90.4 91.4  PLT 320  --  253 249   Basic Metabolic Panel: Recent Labs  Lab  12/09/17 0951 12/09/17 1012 12/10/17 0312 12/11/17 0406  NA 126* 125* 129* 129*  K 3.3* 3.4* 3.6 3.1*  CL 80* 81* 91* 91*  CO2 28  --  25 25  GLUCOSE 192* 190* 116* 127*  BUN 81* 78* 75* 63*  CREATININE 5.17* 5.20* 4.47* 3.29*  CALCIUM 8.8*  --  7.6* 8.1*  MG  --   --   --  2.7*  PHOS  --   --   --  4.1   GFR: Estimated Creatinine Clearance: 33.6 mL/min (A) (by C-G formula based on SCr of 3.29 mg/dL (H)). Liver Function Tests: Recent Labs  Lab 12/09/17 0951  AST 26  ALT 24  ALKPHOS 59  BILITOT 0.7  PROT 7.2  ALBUMIN 3.5   No results for input(s): LIPASE, AMYLASE in the last 168 hours. No results for input(s): AMMONIA in the last 168 hours. Coagulation Profile: Recent Labs  Lab 12/10/17 0312  INR 1.22   Cardiac Enzymes: Recent Labs  Lab 12/09/17 0951  TROPONINI 0.05*   BNP (last 3 results) No results for input(s): PROBNP in the last 8760 hours. HbA1C: Recent Labs    12/10/17 0312  HGBA1C 8.3*   CBG: Recent Labs  Lab 12/10/17 0731 12/10/17 1253 12/10/17 1655 12/10/17 2119 12/11/17 0745  GLUCAP 97 147* 143* 138* 161*   Lipid Profile: No results for input(s): CHOL, HDL, LDLCALC, TRIG, CHOLHDL, LDLDIRECT in the last 72 hours. Thyroid Function Tests: No results for input(s): TSH, T4TOTAL, FREET4, T3FREE, THYROIDAB in the last 72 hours. Anemia Panel: No results for input(s): VITAMINB12, FOLATE, FERRITIN, TIBC, IRON, RETICCTPCT in the last 72 hours. Urine analysis:    Component Value Date/Time   COLORURINE AMBER (A) 03/10/2017 1909   APPEARANCEUR HAZY (A) 03/10/2017 1909   LABSPEC 1.021 03/10/2017 1909   PHURINE 5.0 03/10/2017 1909   GLUCOSEU NEGATIVE 03/10/2017 1909   HGBUR NEGATIVE 03/10/2017 1909   BILIRUBINUR NEGATIVE 03/10/2017 1909   KETONESUR NEGATIVE 03/10/2017 1909   PROTEINUR 100 (A) 03/10/2017 1909   UROBILINOGEN 2 (H) 01/11/2013 1045   NITRITE NEGATIVE 03/10/2017 1909   LEUKOCYTESUR NEGATIVE 03/10/2017 1909   Sepsis  Labs: @LABRCNTIP (procalcitonin:4,lacticidven:4)  ) Recent Results (from the past 240 hour(s))  Culture, blood (routine x 2)     Status: None (Preliminary result)   Collection Time: 12/09/17 10:00 AM  Result Value Ref Range Status   Specimen Description BLOOD RIGHT ANTECUBITAL  Final   Special Requests   Final    BOTTLES DRAWN AEROBIC AND ANAEROBIC Blood Culture adequate volume   Culture   Final    NO GROWTH 1 DAY Performed at [facility], [address] [place], [place] [address]    Report Status PENDING  Incomplete  Culture, blood (routine x 2)     Status: None (Preliminary result)   Collection Time: 12/09/17 10:10 AM  Result Value Ref Range Status   Specimen Description BLOOD LEFT ANTECUBITAL  Final   Special Requests   Final    BOTTLES DRAWN AEROBIC AND ANAEROBIC Blood Culture  results may not be optimal due to an excessive volume of blood received in culture bottles   Culture   Final    NO GROWTH 1 DAY Performed at [facility], [address] [place], [place] [address]    Report Status PENDING  Incomplete  MRSA PCR Screening     Status: None   Collection Time: 12/09/17  5:35 PM  Result Value Ref Range Status   MRSA by PCR NEGATIVE NEGATIVE Final    Comment:        The GeneXpert MRSA Assay (FDA approved for NASAL specimens only), is one component of a comprehensive MRSA colonization surveillance program. It is not intended to diagnose MRSA infection nor to guide or monitor treatment for MRSA infections. Performed at [facility], [address], [place], [place] [address]       Studies: No results found.  Scheduled Meds: . docusate sodium  100 mg Oral BID  . insulin aspart  0-15 Units Subcutaneous TID WC  . insulin glargine  10 Units Subcutaneous QHS  . metoprolol tartrate  50 mg Oral BID  . rivaroxaban  15 mg Oral Q supper    Continuous Infusions: . sodium chloride Stopped (12/10/17 1600)  . heparin 1,700 Units/hr (12/11/17 0600)      LOS: 2 days     Briant Cedar, MD [facility]   If 7PM-7AM, please contact night-coverage [email or web address] Password [email or web address] 12/11/2017, 11:51 AM

## 2017-12-11 NOTE — Progress Notes (Signed)
ANTICOAGULATION CONSULT NOTE - Follow Up Consult  Pharmacy Consult for Heparin to Rivaroxaban Indication: atrial fibrillation and pulmonary embolus  No Known Allergies  Patient Measurements: Height: 6\' 1"  (185.4 cm) Weight: (!) 312 lb 9.8 oz (141.8 kg) IBW/kg (Calculated) : 79.9  Vital Signs: Temp: 97.8 F (36.6 C) (03/23 0743) Temp Source: Oral (03/23 0743) BP: 133/107 (03/23 0800) Pulse Rate: 73 (03/23 0800)  Labs: Recent Labs    12/09/17 0951 12/09/17 1012  12/09/17 1750 12/10/17 0312 12/10/17 1054 12/11/17 0406  HGB 14.2 16.0  --   --  12.5*  --  12.6*  HCT 42.2 47.0  --   --  38.7*  --  39.2  PLT 320  --   --   --  253  --  249  APTT  --   --    < > 45* 80* 69* 83*  LABPROT  --   --   --   --  15.3*  --   --   INR  --   --   --   --  1.22  --   --   HEPARINUNFRC  --   --   --  1.36* 0.99*  --  0.44  CREATININE 5.17* 5.20*  --   --  4.47*  --  3.29*  TROPONINI 0.05*  --   --   --   --   --   --    < > = values in this interval not displayed.    Estimated Creatinine Clearance: 33.6 mL/min (A) (by C-G formula based on SCr of 3.29 mg/dL (H)).   Assessment: 28 yoM admitted with AKI likely 2/2 overdiuresis on rivaroxaban 20mg /day PTA for hx PE 2018 and AFib. Pt initially started on heparin infusion but now to transition back to rivaroxaban. Baseline SCr ~1.2, up to 5.2 on admit but trending down to 3.3 today. Estimated CrCl ~33 ml/min (~45 ml/min with actual body weight) and pt had great UOP yesterday - will initiate reduced dose for now and trend BMET.  Goal of Therapy:  Full Dose Anticoagulation Monitor platelets by anticoagulation protocol: Yes   Plan:  -Stop heparin drip -Start rivaroxaban 15mg /day -Watch renal function closely, increase to 20mg /day as able  Fredonia Highland, PharmD, BCPS PGY-2 Cardiology Pharmacy Resident Pager: [phone] 12/11/2017

## 2017-12-11 NOTE — Discharge Instructions (Signed)

## 2017-12-11 NOTE — Evaluation (Signed)
Physical Therapy Evaluation Patient Details Name: William Alvarado MRN: [id number] DOB: Jul 02, 1951 Today's Date: 12/11/2017   History of Present Illness  Pt is a 67 y.o. male admitted 12/09/17 with hypotension and inability to pass urine. There was concern for hypovolemic vs. septic shock in the setting of acute renal failure. PMH includes CKD III, DM II, PE, chronic a-fib, HF, COPD, morbid obesity.    Clinical Impression  Pt presents with an overall decrease in functional mobility secondary to above. PTA, pt indep with intermittent use of SPC; has 24/7 support available from wife. Today, pt able to amb 300' with SPC and intermittent min guard for balance due to slight instability, progressed to supervision. Required 2x standing rest break secondary to fatigue. Expect pt to progress quickly with mobility. Would benefit from an additional acute PT visit for stair training and ambulation progression.     Follow Up Recommendations No PT follow up;Supervision for mobility/OOB    Equipment Recommendations  None recommended by PT    Recommendations for Other Services       Precautions / Restrictions Precautions Precautions: Fall Restrictions Weight Bearing Restrictions: No      Mobility  Bed Mobility Overal bed mobility: Independent             General bed mobility comments: Received sitting EOB  Transfers Overall transfer level: Independent Equipment used: None                Ambulation/Gait Ambulation/Gait assistance: Supervision Ambulation Distance (Feet): 300 Feet Assistive device: Straight cane Gait Pattern/deviations: Step-through pattern;Decreased stride length Gait velocity: Decreased Gait velocity interpretation: <1.8 ft/sec, indicative of risk for recurrent falls General Gait Details: Slow, controlled amb with SPC and supervision for safety. Slight instability with turns, but able to self-correct. 2x standing rest break secondary to fatigue and  SOB  Stairs            Wheelchair Mobility    Modified Rankin (Stroke Patients Only)       Balance Overall balance assessment: Needs assistance Sitting-balance support: No upper extremity supported Sitting balance-Leahy Scale: Good       Standing balance-Leahy Scale: Fair                               Pertinent Vitals/Pain Pain Assessment: No/denies pain    Home Living Family/patient expects to be discharged to:: Private residence Living Arrangements: Spouse/significant other Available Help at Discharge: Family Type of Home: House Home Access: Stairs to enter Entrance Stairs-Rails: Can reach both Entrance Stairs-Number of Steps: 5 Home Layout: One level Home Equipment: Cane - single point      Prior Function Level of Independence: Independent         Comments: ocassional use of cane     Hand Dominance        Extremity/Trunk Assessment   Upper Extremity Assessment Upper Extremity Assessment: Overall WFL for tasks assessed    Lower Extremity Assessment Lower Extremity Assessment: Overall WFL for tasks assessed       Communication   Communication: No difficulties  Cognition Arousal/Alertness: Awake/alert Behavior During Therapy: WFL for tasks assessed/performed Overall Cognitive Status: Within Functional Limits for tasks assessed                                        General Comments General comments (skin integrity, edema,  etc.): Wife present throughout session    Exercises     Assessment/Plan    PT Assessment Patent does not need any further PT services  PT Problem List         PT Treatment Interventions      PT Goals (Current goals can be found in the Care Plan section)  Acute Rehab PT Goals Patient Stated Goal: Return home PT Goal Formulation: With patient Time For Goal Achievement: 12/25/17 Potential to Achieve Goals: Good    Frequency     Barriers to discharge        Co-evaluation                AM-PAC PT "6 Clicks" Daily Activity  Outcome Measure Difficulty turning over in bed (including adjusting bedclothes, sheets and blankets)?: None Difficulty moving from lying on back to sitting on the side of the bed? : None Difficulty sitting down on and standing up from a chair with arms (e.g., wheelchair, bedside commode, etc,.)?: None Help needed moving to and from a bed to chair (including a wheelchair)?: A Little Help needed walking in hospital room?: A Little Help needed climbing 3-5 steps with a railing? : A Little 6 Click Score: 21    End of Session Equipment Utilized During Treatment: Gait belt Activity Tolerance: Patient tolerated treatment well Patient left: in bed;with call bell/phone within reach;with family/visitor present Nurse Communication: Mobility status PT Visit Diagnosis: Other abnormalities of gait and mobility (R26.89)    Time: 3474-2595 PT Time Calculation (min) (ACUTE ONLY): 15 min   Charges:   PT Evaluation $PT Eval Moderate Complexity: 1 Mod     PT G Codes:       Ina Homes, PT, DPT Acute Rehab Services  Pager: [phone]  Malachy Chamber 12/11/2017, 2:18 PM

## 2017-12-12 DIAGNOSIS — N189 Chronic kidney disease, unspecified: Secondary | ICD-10-CM

## 2017-12-12 DIAGNOSIS — E860 Dehydration: Secondary | ICD-10-CM

## 2017-12-12 DIAGNOSIS — I9589 Other hypotension: Secondary | ICD-10-CM

## 2017-12-12 LAB — BASIC METABOLIC PANEL
Anion gap: 10 (ref 5–15)
BUN: 47 mg/dL — ABNORMAL HIGH (ref 6–20)
CALCIUM: 8.9 mg/dL (ref 8.9–10.3)
CO2: 28 mmol/L (ref 22–32)
Chloride: 94 mmol/L — ABNORMAL LOW (ref 101–111)
Creatinine, Ser: 2.73 mg/dL — ABNORMAL HIGH (ref 0.61–1.24)
GFR, EST AFRICAN AMERICAN: 27 mL/min — AB (ref >60)
GFR, EST NON AFRICAN AMERICAN: 23 mL/min — AB (ref >60)
Glucose, Bld: 118 mg/dL — ABNORMAL HIGH (ref 65–99)
Potassium: 3.9 mmol/L (ref 3.5–5.1)
Sodium: 132 mmol/L — ABNORMAL LOW (ref 135–145)

## 2017-12-12 LAB — CBC WITH DIFFERENTIAL/PLATELET
BASOS ABS: 0 10*3/uL (ref 0.0–0.1)
BASOS PCT: 0 %
EOS PCT: 2 %
Eosinophils Absolute: 0.1 10*3/uL (ref 0.0–0.7)
HEMATOCRIT: 39.8 % (ref 39.0–52.0)
Hemoglobin: 12.6 g/dL — ABNORMAL LOW (ref 13.0–17.0)
Lymphocytes Relative: 22 %
Lymphs Abs: 1.7 10*3/uL (ref 0.7–4.0)
MCH: 29.4 pg (ref 26.0–34.0)
MCHC: 31.7 g/dL (ref 30.0–36.0)
MCV: 92.8 fL (ref 78.0–100.0)
Monocytes Absolute: 0.7 10*3/uL (ref 0.1–1.0)
Monocytes Relative: 9 %
NEUTROS ABS: 5.4 10*3/uL (ref 1.7–7.7)
Neutrophils Relative %: 67 %
PLATELETS: 260 10*3/uL (ref 150–400)
RBC: 4.29 MIL/uL (ref 4.22–5.81)
RDW: 16 % — ABNORMAL HIGH (ref 11.5–15.5)
WBC: 8 10*3/uL (ref 4.0–10.5)

## 2017-12-12 LAB — LACTIC ACID, PLASMA: Lactic Acid, Venous: 0.9 mmol/L (ref 0.5–1.9)

## 2017-12-12 LAB — GLUCOSE, CAPILLARY
Glucose-Capillary: 114 mg/dL — ABNORMAL HIGH (ref 65–99)
Glucose-Capillary: 113 mg/dL — ABNORMAL HIGH (ref 65–99)
Glucose-Capillary: 145 mg/dL — ABNORMAL HIGH (ref 65–99)

## 2017-12-12 MED ORDER — DOCUSATE SODIUM 100 MG PO CAPS
100.0000 mg | ORAL_CAPSULE | Freq: Two times a day (BID) | ORAL | 0 refills | Status: DC
Start: 2017-12-12 — End: 2017-12-16

## 2017-12-12 MED ORDER — RIVAROXABAN 20 MG PO TABS: 20.0000 mg | ORAL_TABLET | Freq: Every day | ORAL | Status: DC

## 2017-12-12 NOTE — Plan of Care (Signed)
Pt lower legs dusky, reddish purple.  Encouraged to elevate whenever possible to promote venous return to heart.

## 2017-12-12 NOTE — Discharge Summary (Signed)
Physician Discharge Summary  Patient ID: William Alvarado MRN: [id number] DOB/AGE: [birth year] 67 y.o.  Admit date: 12/09/2017 Discharge date: 12/12/2017  Admission Diagnoses:  Discharge Diagnoses:  Hypotension/hypovolemic shock Acute kidney injury on chronic kidney disease stage III Hyponatremia Diabetes mellitus type 2 Hypertension Chronic atrial fibrillation Chronic diastolic congestive heart failure   Discharged Condition: stable  Hospital Course: Patient is a 67 year old male with past medical history significant for chronic atrial fibrillation, diastolic heart failure, history of PE in 6/18, DM type 2, and morbid obesity.  Patient  was admitted with anuria, BUN of 78 and creatinine of 5.2.  Apparently, there was substantial increase in the days of patient's Lasix prior to admission.  Patient was also on ACE inhibitor.  On presentation to the hospital, the patient was also significantly hypotensive.  Patient reported mild shortness of breath with nonproductive cough, with WBC of 14,000.  Due to severe shock and acute kidney injury on chronic kidney disease stage III, the patient was initially admitted to ICU.  On admission to the ICU, all diuretics and antihypertensives were held, Patient was volume resuscitated, antibiotics were initially started but discontinued as the etiology of the shock was felt to be severe hypovolemia.  With aggressive hydration, acute kidney injury, anuria, dehydration and hypotensive shock resolved.  Patient will be discharged back to the care of the primary care provider.  AKI on CKD stage 3: Acute kidney injury has resolved significantly.  Patient's baseline serum creatinine is around 1.5-1.8.  Will continue to hold Lasix, lisinopril and any other nephrotoxic medications.  Hypotension: This resolved with aggressive hydration, and discontinuation of anti-hypertensives and diuretics.    Hyponatremia: This improved significantly with hydration.  Type II  DM Hemoglobin A1c was 8.3%. Blood sugar control was optimized during the hospital stay.  Hypertension Stable for now. Lisinopril, Lasix/metolazone as still on hold.  Will defer timing of resumption to the primary care provider.  Continue to monitor renal function and electrolytes closely.  History of PE/chronic A. Fib: Metoprolol and Xarelto were continued.  Diastolic heart failure: Stable.   Morbid obesity    Significant Diagnostic Studies: On presentation to the hospital, the patient's BUN was 78 and the creatinine was 5.2.  Prior to discharge, the BUN was down to 47 and a creatinine 2.73.  Sodium was 125 on admission and 132 prior to discharge.  Discharge Exam: Blood pressure (!) 139/110, pulse 85, temperature 97.6 F (36.4 C), temperature source Oral, resp. rate 18, height 6\' 1"  (1.854 m), weight (!) 141.1 kg (311 lb 1.6 oz), SpO2 96 %.   Disposition: Discharge disposition: 01-Home or Self Care    Discharge Instructions    Call MD for:   Complete by:  As directed    Please call MD if symptoms worsen   Diet - low sodium heart healthy   Complete by:  As directed    Discharge instructions   Complete by:  As directed    Check BMP at the PCP's office in 1-2 days.   Increase activity slowly   Complete by:  As directed      Allergies as of 12/12/2017   No Known Allergies     Medication List    STOP taking these medications   furosemide 40 MG tablet Commonly known as:  LASIX   lisinopril 10 MG tablet Commonly known as:  PRINIVIL,ZESTRIL   metolazone 5 MG tablet Commonly known as:  ZAROXOLYN   potassium chloride SA 20 MEQ tablet Commonly known as:  K-DUR,KLOR-CON     TAKE these medications   ANORO ELLIPTA 62.5-25 MCG/INH Aepb Generic drug:  umeclidinium-vilanterol Inhale 1 puff into the lungs daily.   docusate sodium 100 MG capsule Commonly known as:  COLACE Take 1 capsule (100 mg total) by mouth 2 (two) times daily.   Dulaglutide 1.5 MG/0.5ML  Sopn Commonly known as:  TRULICITY Inject 1.5 mg into the skin once a week.   fish oil-omega-3 fatty acids 1000 MG capsule Take 1 g by mouth daily.   metoprolol tartrate 50 MG tablet Commonly known as:  LOPRESSOR Take 1 tablet (50 mg total) by mouth 2 (two) times daily.   rivaroxaban 20 MG Tabs tablet Commonly known as:  XARELTO Take 1 tablet (20 mg total) by mouth daily with supper.   sitaGLIPtin 100 MG tablet Commonly known as:  JANUVIA Take 1 tablet (100 mg total) daily by mouth.        SignedBarnetta Chapel 12/12/2017, 2:55 PM

## 2017-12-12 NOTE — Progress Notes (Signed)
Patient given discharge instructions and all questions answered.  

## 2017-12-12 NOTE — Progress Notes (Signed)
ANTICOAGULATION CONSULT NOTE - Follow Up Consult  Pharmacy Consult for Heparin to Rivaroxaban Indication: atrial fibrillation and pulmonary embolus  No Known Allergies  Patient Measurements: Height: 6\' 1"  (185.4 cm) Weight: (!) 311 lb 1.6 oz (141.1 kg)(scale a) IBW/kg (Calculated) : 79.9  Vital Signs: Temp: 98.6 F (37 C) (03/24 0420) Temp Source: Oral (03/24 0420) BP: 106/86 (03/24 0420) Pulse Rate: 85 (03/24 0420)  Labs: Recent Labs    12/09/17 1750  12/10/17 0312 12/10/17 1054 12/11/17 0406 12/12/17 0703  HGB  --    < > 12.5*  --  12.6* 12.6*  HCT  --   --  38.7*  --  39.2 39.8  PLT  --   --  253  --  249 260  APTT 45*  --  80* 69* 83*  --   LABPROT  --   --  15.3*  --   --   --   INR  --   --  1.22  --   --   --   HEPARINUNFRC 1.36*  --  0.99*  --  0.44  --   CREATININE  --   --  4.47*  --  3.29* 2.73*   < > = values in this interval not displayed.    Estimated Creatinine Clearance: 40.4 mL/min (A) (by C-G formula based on SCr of 2.73 mg/dL (H)).   Assessment: 79 yoM admitted with AKI likely 2/2 overdiuresis on rivaroxaban 20mg /day PTA for hx PE 2018 and AFib. Pt initially started on heparin infusion but now to transition back to rivaroxaban.   Baseline SCr ~1.2, up to 5.2 on admit but trending down to 2.73 today. Estimated CrCl ~40 ml/min (~54 mL/min with actual body weight). Received reduced dose of 15 mg yesterday. No signs/symptoms of bleeding.  Goal of Therapy:  Full Dose Anticoagulation Monitor platelets by anticoagulation protocol: Yes   Plan:  -Increase rivaroxaban to 20 mg/day given improvement in SCr -Watch renal function closely, CBC, s/sx of bleeding  Girard Cooter, PharmD Clinical Pharmacist  Pager: [phone] Clinical Phone for 12/12/2017 until 3:30pm: [phone] If after 3:30pm, please call main pharmacy at [phone] 12/12/2017

## 2017-12-14 LAB — CULTURE, BLOOD (ROUTINE X 2)
Culture: NO GROWTH
Culture: NO GROWTH
SPECIAL REQUESTS: ADEQUATE

## 2017-12-16 ENCOUNTER — Ambulatory Visit: Payer: BLUE CROSS/BLUE SHIELD | Admitting: Family Medicine

## 2017-12-16 ENCOUNTER — Encounter: Payer: Self-pay | Admitting: Family Medicine

## 2017-12-16 VITALS — BP 138/76 | HR 60 | Temp 98.2°F | Resp 22 | Ht 73.0 in | Wt 318.0 lb

## 2017-12-16 DIAGNOSIS — Z09 Encounter for follow-up examination after completed treatment for conditions other than malignant neoplasm: Secondary | ICD-10-CM

## 2017-12-16 DIAGNOSIS — I509 Heart failure, unspecified: Secondary | ICD-10-CM | POA: Diagnosis not present

## 2017-12-16 DIAGNOSIS — I481 Persistent atrial fibrillation: Secondary | ICD-10-CM | POA: Diagnosis not present

## 2017-12-16 DIAGNOSIS — I4819 Other persistent atrial fibrillation: Secondary | ICD-10-CM

## 2017-12-16 DIAGNOSIS — E861 Hypovolemia: Secondary | ICD-10-CM

## 2017-12-16 DIAGNOSIS — N179 Acute kidney failure, unspecified: Secondary | ICD-10-CM

## 2017-12-16 DIAGNOSIS — I9589 Other hypotension: Secondary | ICD-10-CM | POA: Diagnosis not present

## 2017-12-16 LAB — BASIC METABOLIC PANEL WITH GFR
BUN/Creatinine Ratio: 14 (calc) (ref 6–22)
BUN: 29 mg/dL — AB (ref 7–25)
CALCIUM: 9 mg/dL (ref 8.6–10.3)
CHLORIDE: 102 mmol/L (ref 98–110)
CO2: 26 mmol/L (ref 20–32)
Creat: 2.01 mg/dL — ABNORMAL HIGH (ref 0.70–1.25)
GFR, EST AFRICAN AMERICAN: 39 mL/min/{1.73_m2} — AB (ref >=60)
GFR, EST NON AFRICAN AMERICAN: 34 mL/min/{1.73_m2} — AB (ref >=60)
Glucose, Bld: 158 mg/dL — ABNORMAL HIGH (ref 65–99)
Potassium: 4.7 mmol/L (ref 3.5–5.3)
Sodium: 137 mmol/L (ref 135–146)

## 2017-12-16 LAB — CBC WITH DIFFERENTIAL/PLATELET
BASOS ABS: 52 {cells}/uL (ref 0–200)
Basophils Relative: 0.6 %
EOS ABS: 113 {cells}/uL (ref 15–500)
Eosinophils Relative: 1.3 %
HCT: 38.6 % (ref 38.5–50.0)
Hemoglobin: 12.7 g/dL — ABNORMAL LOW (ref 13.2–17.1)
Lymphs Abs: 1035 cells/uL (ref 850–3900)
MCH: 29.3 pg (ref 27.0–33.0)
MCHC: 32.9 g/dL (ref 32.0–36.0)
MCV: 88.9 fL (ref 80.0–100.0)
MONOS PCT: 7.2 %
MPV: 10.5 fL (ref 7.5–12.5)
NEUTROS PCT: 79 %
Neutro Abs: 6873 cells/uL (ref 1500–7800)
PLATELETS: 281 10*3/uL (ref 140–400)
RBC: 4.34 10*6/uL (ref 4.20–5.80)
RDW: 14.3 % (ref 11.0–15.0)
TOTAL LYMPHOCYTE: 11.9 %
WBC: 8.7 10*3/uL (ref 3.8–10.8)
WBCMIX: 626 {cells}/uL (ref 200–950)

## 2017-12-16 NOTE — Progress Notes (Signed)
Subjective:    Patient ID: William Alvarado, male    DOB: 1951-05-04, 67 y.o.   MRN: [id number]  Medication Refill     Previoiusly, I have been treating the patient for presumed heart failure due to increased weight gain, pitting edema in his extremities, and bibasilar crackles.  He has a history of chronic renal insufficiency with a baseline creatinine between 1.6 and 1.8.  I had increased his Lasix at his last visit.  Subsequently, the patient developed oliguria and decreased urinary output.  Was seen in the emergency room where his creatinine was found to be greater than 5!.  He was hypotensive due to hypovolemia.  Chest x-ray was concerning for possible right middle lobe infiltrate.  Patient was admitted to the ICU and was treated with IV fluids for hypovolemic prerenal azotemia.  Xarelto was held due to renal insufficiency and the patient was placed on heparin.  His pneumonia was treated empirically with antibiotics.  Patient was discharged home from the hospital with a creatinine of 2.3.  He is here today for follow-up.  He states he feels much better.  He continues to complain of dyspnea on exertion which may be chronic for this long-term smoker with congestive heart failure however his cough is better.  He does have +1 pitting edema in both legs distal to the knee with left basilar crackles on exam but no JVD.  He does demonstrate abdominal distention. Past Medical History:  Diagnosis Date  . Acute respiratory failure with hypoxia (HCC)    Hospitalized with acute respiratory failure secondary to CAP, diastolic CHF, and pulmonary embolism  . AKI (acute kidney injury) (HCC)   . Allergy    allergic rhinitis  . Altered mental status   . Atrial fibrillation (HCC)   . Chronic anticoagulation 03/26/2017   Failed Coumadin (PE with therapeutic INR). Now on Xarelto  . Colon polyps   . Community acquired pneumonia of right lower lobe of lung (HCC)   . COPD (chronic obstructive pulmonary disease)  (HCC)   . Degenerative disc disease, lumbar   . Diabetes mellitus without complication (HCC)   . History of pulmonary embolus (PE) 03/26/2017  . Hypertension   . Mitral regurgitation 12/26/2012  . Obesity (BMI 30-39.9) 12/09/2012  . PAF (paroxysmal atrial fibrillation) (HCC)    DCCV in 2014 with recurrent PAF in setting of respiratory failure June 2018. S/P DCCV 04/13/17 to NSR  . Persistent atrial fibrillation (HCC)   . Pneumonia ~ 2016  . Sepsis (HCC) 03/10/2017  . Sleep apnea   . Systolic murmur   . Type II diabetes mellitus (HCC)    Past Surgical History:  Procedure Laterality Date  . CARDIOVERSION N/A 01/09/2013   Procedure: CARDIOVERSION;  Surgeon: Pricilla Riffle, MD;  Location: [facility] ENDOSCOPY;  Service: Cardiovascular;  Laterality: N/A;  . CARDIOVERSION N/A 04/13/2017   Procedure: CARDIOVERSION;  Surgeon: Quintella Reichert, MD;  Location: [facility] ENDOSCOPY;  Service: Cardiovascular;  Laterality: N/A;  . TEE WITHOUT CARDIOVERSION N/A 01/09/2013   Procedure: TRANSESOPHAGEAL ECHOCARDIOGRAM (TEE);  Surgeon: Pricilla Riffle, MD;  Location: [facility] ENDOSCOPY;  Service: Cardiovascular;  Laterality: N/A;   Current Outpatient Medications on File Prior to Visit  Medication Sig Dispense Refill  . Dulaglutide (TRULICITY) 1.5 MG/0.5ML SOPN Inject 1.5 mg into the skin once a week. 2 mL 3  . fish oil-omega-3 fatty acids 1000 MG capsule Take 1 g by mouth daily.    . metoprolol tartrate (LOPRESSOR) 50 MG tablet Take 1 tablet (  50 mg total) by mouth 2 (two) times daily. 60 tablet 11  . rivaroxaban (XARELTO) 20 MG TABS tablet Take 1 tablet (20 mg total) by mouth daily with supper. 90 tablet 3  . sitaGLIPtin (JANUVIA) 100 MG tablet Take 1 tablet (100 mg total) daily by mouth. 30 tablet 3   No current facility-administered medications on file prior to visit.    No Known Allergies Social History   Socioeconomic History  . Marital status: Married    Spouse name: Not on file  . Number of children: 2  . Years of  education: Not on file  . Highest education level: Not on file  Occupational History    Comment: Machinist  Social Needs  . Financial resource strain: Not on file  . Food insecurity:    Worry: Not on file    Inability: Not on file  . Transportation needs:    Medical: Not on file    Non-medical: Not on file  Tobacco Use  . Smoking status: Former Smoker    Packs/day: 1.00    Years: 43.00    Pack years: 43.00    Types: Cigarettes    Last attempt to quit: 03/08/2017    Years since quitting: 0.7  . Smokeless tobacco: Never Used  Substance and Sexual Activity  . Alcohol use: No    Comment: Rare  . Drug use: No  . Sexual activity: Not Currently  Lifestyle  . Physical activity:    Days per week: Not on file    Minutes per session: Not on file  . Stress: Not on file  Relationships  . Social connections:    Talks on phone: Not on file    Gets together: Not on file    Attends religious service: Not on file    Active member of club or organization: Not on file    Attends meetings of clubs or organizations: Not on file    Relationship status: Not on file  . Intimate partner violence:    Fear of current or ex partner: Not on file    Emotionally abused: Not on file    Physically abused: Not on file    Forced sexual activity: Not on file  Other Topics Concern  . Not on file  Social History Narrative  . Not on file      Review of Systems  All other systems reviewed and are negative.      Objective:   Physical Exam  Constitutional: He appears well-developed and well-nourished. No distress.  Neck: Neck supple. No JVD present.  Cardiovascular: Normal rate. An irregularly irregular rhythm present.  Pulmonary/Chest: Effort normal. He has decreased breath sounds. He has no wheezes. He has rhonchi. He has rales.  Abdominal: Soft. Bowel sounds are normal. He exhibits distension. There is no tenderness. There is no rebound and no guarding.  Musculoskeletal: He exhibits edema.    Skin: No rash noted. He is not diaphoretic. No erythema.          Assessment & Plan:  Hospital discharge follow-up - Plan: BASIC METABOLIC PANEL WITH GFR, CBC with Differential/Platelet  Chronic congestive heart failure, unspecified heart failure type (HCC)  Hypotension due to hypovolemia  Morbid obesity (HCC)  Persistent atrial fibrillation (HCC)  AKI (acute kidney injury) (HCC)  I apologized to the patient.  I would recheck a BMP today to monitor his renal function.  Baseline creatinine is between 1.6 and 1.8.  May need to adjust Xarelto depending upon his renal function.  His blood pressure is back to normal now.  He is not hypoxic.  He is not in respiratory distress.  However his exam appears to demonstrate mild signs of fluid overload.  Obviously I am very hesitant to want to resume any diuretic based on what just happened.  Therefore I will recheck a BMP and a CBC today.  Recheck the patient next week.  If he continues to experience weight gain and if his exam worsens and if his renal function is stable, we may need to resume a very low-dose diuretic.  Patient had done well on Lasix 40 mg twice daily for 1 year.  Obviously he worsened when I increased his Lasix.  He may benefit from resuming a low-dose Lasix such as 40 mg a day if he continues to retain fluid however I would be very conservative about resuming this medication.  Recheck next week

## 2017-12-20 ENCOUNTER — Telehealth: Payer: Self-pay | Admitting: Family Medicine

## 2017-12-20 NOTE — Telephone Encounter (Signed)
Called and spoke to William Alvarado this am to inform of labs and she states that his wt is up 7 pounds from Friday. He is weighing 323 lbs this am.

## 2017-12-21 ENCOUNTER — Ambulatory Visit: Payer: BLUE CROSS/BLUE SHIELD | Admitting: Family Medicine

## 2017-12-21 ENCOUNTER — Encounter: Payer: Self-pay | Admitting: Family Medicine

## 2017-12-21 ENCOUNTER — Other Ambulatory Visit: Payer: Self-pay | Admitting: Family Medicine

## 2017-12-21 VITALS — BP 118/76 | HR 88 | Temp 98.0°F | Resp 20 | Ht 73.0 in | Wt 329.0 lb

## 2017-12-21 DIAGNOSIS — N179 Acute kidney failure, unspecified: Secondary | ICD-10-CM

## 2017-12-21 DIAGNOSIS — I5032 Chronic diastolic (congestive) heart failure: Secondary | ICD-10-CM | POA: Diagnosis not present

## 2017-12-21 DIAGNOSIS — N189 Chronic kidney disease, unspecified: Secondary | ICD-10-CM

## 2017-12-21 DIAGNOSIS — E860 Dehydration: Secondary | ICD-10-CM

## 2017-12-21 DIAGNOSIS — R579 Shock, unspecified: Secondary | ICD-10-CM

## 2017-12-21 DIAGNOSIS — E861 Hypovolemia: Secondary | ICD-10-CM

## 2017-12-21 LAB — EXTRA LAV TOP TUBE

## 2017-12-21 LAB — BASIC METABOLIC PANEL WITH GFR
BUN / CREAT RATIO: 14 (calc) (ref 6–22)
BUN: 24 mg/dL (ref 7–25)
CHLORIDE: 104 mmol/L (ref 98–110)
CO2: 27 mmol/L (ref 20–32)
Calcium: 8.7 mg/dL (ref 8.6–10.3)
Creat: 1.75 mg/dL — ABNORMAL HIGH (ref 0.70–1.25)
GFR, EST AFRICAN AMERICAN: 47 mL/min/{1.73_m2} — AB (ref >=60)
GFR, Est Non African American: 40 mL/min/{1.73_m2} — ABNORMAL LOW (ref >=60)
Glucose, Bld: 134 mg/dL — ABNORMAL HIGH (ref 65–99)
Potassium: 4.9 mmol/L (ref 3.5–5.3)
SODIUM: 136 mmol/L (ref 135–146)

## 2017-12-21 MED ORDER — HYDROCODONE-ACETAMINOPHEN 5-325 MG PO TABS: 1.0000 | ORAL_TABLET | Freq: Four times a day (QID) | ORAL | 0 refills | Status: DC | PRN

## 2017-12-21 NOTE — Addendum Note (Signed)
Addended by: Lynnea Ferrier T on: 12/21/2017 11:06 AM   Modules accepted: Orders

## 2017-12-21 NOTE — Telephone Encounter (Signed)
Will discuss at ov today

## 2017-12-21 NOTE — Progress Notes (Addendum)
Subjective:    Patient ID: William Alvarado, male    DOB: Jun 10, 1951, 67 y.o.   MRN: [id number]  HPI Please see previous office visit.  Patient has gained 9 additional pounds since his last office visit.  He has +2 pitting edema in both legs to the knee.  Both legs have now become violaceous in color as well as the feet due to edema and poor circulation.  He reports increasing dyspnea on exertion and now has left basilar crackles.  We have been holding all diuretics due to his recent acute kidney injury due to dehydration from overuse of diuretics.  However it appears that he is now fluid overloaded on exam and require some diuresis  Past Medical History:  Diagnosis Date  . Acute respiratory failure with hypoxia (HCC)    Hospitalized with acute respiratory failure secondary to CAP, diastolic CHF, and pulmonary embolism  . AKI (acute kidney injury) (HCC)   . Allergy    allergic rhinitis  . Altered mental status   . Atrial fibrillation (HCC)   . Chronic anticoagulation 03/26/2017   Failed Coumadin (PE with therapeutic INR). Now on Xarelto  . Colon polyps   . Community acquired pneumonia of right lower lobe of lung (HCC)   . COPD (chronic obstructive pulmonary disease) (HCC)   . Degenerative disc disease, lumbar   . Diabetes mellitus without complication (HCC)   . History of pulmonary embolus (PE) 03/26/2017  . Hypertension   . Mitral regurgitation 12/26/2012  . Obesity (BMI 30-39.9) 12/09/2012  . PAF (paroxysmal atrial fibrillation) (HCC)    DCCV in 2014 with recurrent PAF in setting of respiratory failure June 2018. S/P DCCV 04/13/17 to NSR  . Persistent atrial fibrillation (HCC)   . Pneumonia ~ 2016  . Sepsis (HCC) 03/10/2017  . Sleep apnea   . Systolic murmur   . Type II diabetes mellitus (HCC)    Past Surgical History:  Procedure Laterality Date  . CARDIOVERSION N/A 01/09/2013   Procedure: CARDIOVERSION;  Surgeon: Pricilla Riffle, MD;  Location: [facility] ENDOSCOPY;  Service: Cardiovascular;   Laterality: N/A;  . CARDIOVERSION N/A 04/13/2017   Procedure: CARDIOVERSION;  Surgeon: Quintella Reichert, MD;  Location: [facility] ENDOSCOPY;  Service: Cardiovascular;  Laterality: N/A;  . TEE WITHOUT CARDIOVERSION N/A 01/09/2013   Procedure: TRANSESOPHAGEAL ECHOCARDIOGRAM (TEE);  Surgeon: Pricilla Riffle, MD;  Location: [facility] ENDOSCOPY;  Service: Cardiovascular;  Laterality: N/A;   Current Outpatient Medications on File Prior to Visit  Medication Sig Dispense Refill  . Dulaglutide (TRULICITY) 1.5 MG/0.5ML SOPN Inject 1.5 mg into the skin once a week. 2 mL 3  . fish oil-omega-3 fatty acids 1000 MG capsule Take 1 g by mouth daily.    . metoprolol tartrate (LOPRESSOR) 50 MG tablet Take 1 tablet (50 mg total) by mouth 2 (two) times daily. 60 tablet 11  . rivaroxaban (XARELTO) 20 MG TABS tablet Take 1 tablet (20 mg total) by mouth daily with supper. 90 tablet 3  . sitaGLIPtin (JANUVIA) 100 MG tablet Take 1 tablet (100 mg total) daily by mouth. 30 tablet 3   No current facility-administered medications on file prior to visit.    No Known Allergies Social History   Socioeconomic History  . Marital status: Married    Spouse name: Not on file  . Number of children: 2  . Years of education: Not on file  . Highest education level: Not on file  Occupational History    Comment: Machinist  Social Needs  .  Financial resource strain: Not on file  . Food insecurity:    Worry: Not on file    Inability: Not on file  . Transportation needs:    Medical: Not on file    Non-medical: Not on file  Tobacco Use  . Smoking status: Former Smoker    Packs/day: 1.00    Years: 43.00    Pack years: 43.00    Types: Cigarettes    Last attempt to quit: 03/08/2017    Years since quitting: 0.7  . Smokeless tobacco: Never Used  Substance and Sexual Activity  . Alcohol use: No    Comment: Rare  . Drug use: No  . Sexual activity: Not Currently  Lifestyle  . Physical activity:    Days per week: Not on file    Minutes per  session: Not on file  . Stress: Not on file  Relationships  . Social connections:    Talks on phone: Not on file    Gets together: Not on file    Attends religious service: Not on file    Active member of club or organization: Not on file    Attends meetings of clubs or organizations: Not on file    Relationship status: Not on file  . Intimate partner violence:    Fear of current or ex partner: Not on file    Emotionally abused: Not on file    Physically abused: Not on file    Forced sexual activity: Not on file  Other Topics Concern  . Not on file  Social History Narrative  . Not on file      Review of Systems  All other systems reviewed and are negative.      Objective:   Physical Exam  Constitutional: He appears well-developed and well-nourished.  Neck: Neck supple. No JVD present.  Cardiovascular: Normal rate and normal heart sounds. An irregularly irregular rhythm present.  No murmur heard. Pulmonary/Chest: Effort normal. No respiratory distress. He has no wheezes. He has rales in the left lower field.  Abdominal: Soft. Bowel sounds are normal. He exhibits no distension. There is no tenderness. There is no rebound.  Musculoskeletal: He exhibits edema.  Vitals reviewed.         Assessment & Plan:  CHF (congestive heart failure), NYHA class II, chronic, diastolic (HCC) - Plan: DG Lumbar Spine Complete, BASIC METABOLIC PANEL WITH GFR, DISCONTINUED: HYDROcodone-acetaminophen (NORCO) 5-325 MG tablet   I had originally documented incorrectly in his chart regarding low back pain.  This was unintentional and was related to another patient encounter not pertaining to this individual.  Therefore I have addended his chart and erased all mention of low back pain.  The patient did not fall.  He did not suffer an injury to his lower back.  He has no lower back pain.  I will cancel the x-ray that I ordered incorrectly.  I will also cancel the narcotic prescription that was sent  to the pharmacy in [place].  This was incorrectly documented on the wrong patient  Patient is fluid overloaded on exam.  Resume Lasix 40 mg bid and recheck swelling and BMP in 1 week.  He had previously tolerated this dose without complications for more than 6 months.

## 2017-12-23 ENCOUNTER — Other Ambulatory Visit: Payer: Self-pay | Admitting: Family Medicine

## 2017-12-23 DIAGNOSIS — I5032 Chronic diastolic (congestive) heart failure: Secondary | ICD-10-CM

## 2017-12-23 DIAGNOSIS — N289 Disorder of kidney and ureter, unspecified: Secondary | ICD-10-CM

## 2017-12-28 ENCOUNTER — Other Ambulatory Visit: Payer: BLUE CROSS/BLUE SHIELD

## 2017-12-28 DIAGNOSIS — I5032 Chronic diastolic (congestive) heart failure: Secondary | ICD-10-CM

## 2017-12-28 DIAGNOSIS — N289 Disorder of kidney and ureter, unspecified: Secondary | ICD-10-CM

## 2017-12-29 LAB — BASIC METABOLIC PANEL
BUN / CREAT RATIO: 14 (calc) (ref 6–22)
BUN: 25 mg/dL (ref 7–25)
CO2: 28 mmol/L (ref 20–32)
Calcium: 8.9 mg/dL (ref 8.6–10.3)
Chloride: 98 mmol/L (ref 98–110)
Creat: 1.74 mg/dL — ABNORMAL HIGH (ref 0.70–1.25)
GLUCOSE: 108 mg/dL — AB (ref 65–99)
Potassium: 4 mmol/L (ref 3.5–5.3)
SODIUM: 136 mmol/L (ref 135–146)

## 2017-12-29 LAB — SPECIMEN COMPROMISED

## 2017-12-31 ENCOUNTER — Encounter: Payer: Self-pay | Admitting: Family Medicine

## 2017-12-31 ENCOUNTER — Ambulatory Visit: Payer: BLUE CROSS/BLUE SHIELD | Admitting: Family Medicine

## 2017-12-31 VITALS — BP 130/80 | HR 80 | Temp 97.7°F | Resp 20 | Ht 73.0 in | Wt 327.0 lb

## 2017-12-31 DIAGNOSIS — I5032 Chronic diastolic (congestive) heart failure: Secondary | ICD-10-CM | POA: Diagnosis not present

## 2017-12-31 DIAGNOSIS — I872 Venous insufficiency (chronic) (peripheral): Secondary | ICD-10-CM | POA: Diagnosis not present

## 2017-12-31 DIAGNOSIS — N289 Disorder of kidney and ureter, unspecified: Secondary | ICD-10-CM | POA: Diagnosis not present

## 2017-12-31 NOTE — Progress Notes (Signed)
Subjective:    Patient ID: William Alvarado, male    DOB: Feb 18, 1951, 67 y.o.   MRN: [id number]  HPI  12/21/17 Please see previous office visit.  Patient has gained 9 additional pounds since his last office visit.  He has +2 pitting edema in both legs to the knee.  Both legs have now become violaceous in color as well as the feet due to edema and poor circulation.  He reports increasing dyspnea on exertion and now has left basilar crackles.  We have been holding all diuretics due to his recent acute kidney injury due to dehydration from overuse of diuretics.  However it appears that he is now fluid overloaded on exam and require some diuresis.  At that time, my plan was:  I had originally documented incorrectly in his chart regarding low back pain.  This was unintentional and was related to another patient encounter not pertaining to this individual.  Therefore I have addended his chart and erased all mention of low back pain.  The patient did not fall.  He did not suffer an injury to his lower back.  He has no lower back pain.  I will cancel the x-ray that I ordered incorrectly.  I will also cancel the narcotic prescription that was sent to the pharmacy in [place].  This was incorrectly documented on the wrong patient  Patient is fluid overloaded on exam.  Resume Lasix 40 mg bid and recheck swelling and BMP in 1 week.  He had previously tolerated this dose without complications for more than 6 months.    12/31/17 Patient presents today with worsening leg swelling.  He has +2/10 pitting edema in both legs all the way to the knees.  This is despite taking Lasix 40 mg twice daily.  Both legs are red.  He reports pain in both legs due to the swelling.  There is weeping edema coming from the lateral aspect of his right shin. Wt Readings from Last 3 Encounters:  12/31/17 (!) 327 lb (148.3 kg)  12/21/17 (!) 329 lb (149.2 kg)  12/16/17 (!) 318 lb (144.2 kg)   He denies any chest pain.  He denies any  shortness of breath.  He denies any dyspnea on exertion however his leg swelling is worse.  And he is developing a venous stasis ulcer on the right lateral shin  Past Medical History:  Diagnosis Date  . Acute respiratory failure with hypoxia (HCC)    Hospitalized with acute respiratory failure secondary to CAP, diastolic CHF, and pulmonary embolism  . AKI (acute kidney injury) (HCC)   . Allergy    allergic rhinitis  . Altered mental status   . Atrial fibrillation (HCC)   . Chronic anticoagulation 03/26/2017   Failed Coumadin (PE with therapeutic INR). Now on Xarelto  . Colon polyps   . Community acquired pneumonia of right lower lobe of lung (HCC)   . COPD (chronic obstructive pulmonary disease) (HCC)   . Degenerative disc disease, lumbar   . Diabetes mellitus without complication (HCC)   . History of pulmonary embolus (PE) 03/26/2017  . Hypertension   . Mitral regurgitation 12/26/2012  . Obesity (BMI 30-39.9) 12/09/2012  . PAF (paroxysmal atrial fibrillation) (HCC)    DCCV in 2014 with recurrent PAF in setting of respiratory failure June 2018. S/P DCCV 04/13/17 to NSR  . Persistent atrial fibrillation (HCC)   . Pneumonia ~ 2016  . Sepsis (HCC) 03/10/2017  . Sleep apnea   . Systolic murmur   .  Type II diabetes mellitus (HCC)    Past Surgical History:  Procedure Laterality Date  . CARDIOVERSION N/A 01/09/2013   Procedure: CARDIOVERSION;  Surgeon: Pricilla Riffle, MD;  Location: [facility] ENDOSCOPY;  Service: Cardiovascular;  Laterality: N/A;  . CARDIOVERSION N/A 04/13/2017   Procedure: CARDIOVERSION;  Surgeon: Quintella Reichert, MD;  Location: [facility] ENDOSCOPY;  Service: Cardiovascular;  Laterality: N/A;  . TEE WITHOUT CARDIOVERSION N/A 01/09/2013   Procedure: TRANSESOPHAGEAL ECHOCARDIOGRAM (TEE);  Surgeon: Pricilla Riffle, MD;  Location: [facility] ENDOSCOPY;  Service: Cardiovascular;  Laterality: N/A;   Current Outpatient Medications on File Prior to Visit  Medication Sig Dispense Refill  . Dulaglutide  (TRULICITY) 1.5 MG/0.5ML SOPN Inject 1.5 mg into the skin once a week. 2 mL 3  . fish oil-omega-3 fatty acids 1000 MG capsule Take 1 g by mouth daily.    . furosemide (LASIX) 40 MG tablet Take 40 mg by mouth.    . metoprolol tartrate (LOPRESSOR) 50 MG tablet Take 1 tablet (50 mg total) by mouth 2 (two) times daily. 60 tablet 11  . rivaroxaban (XARELTO) 20 MG TABS tablet Take 1 tablet (20 mg total) by mouth daily with supper. 90 tablet 3  . sitaGLIPtin (JANUVIA) 100 MG tablet Take 1 tablet (100 mg total) daily by mouth. 30 tablet 3   No current facility-administered medications on file prior to visit.    No Known Allergies Social History   Socioeconomic History  . Marital status: Married    Spouse name: Not on file  . Number of children: 2  . Years of education: Not on file  . Highest education level: Not on file  Occupational History    Comment: Machinist  Social Needs  . Financial resource strain: Not on file  . Food insecurity:    Worry: Not on file    Inability: Not on file  . Transportation needs:    Medical: Not on file    Non-medical: Not on file  Tobacco Use  . Smoking status: Former Smoker    Packs/day: 1.00    Years: 43.00    Pack years: 43.00    Types: Cigarettes    Last attempt to quit: 03/08/2017    Years since quitting: 0.8  . Smokeless tobacco: Never Used  Substance and Sexual Activity  . Alcohol use: No    Comment: Rare  . Drug use: No  . Sexual activity: Not Currently  Lifestyle  . Physical activity:    Days per week: Not on file    Minutes per session: Not on file  . Stress: Not on file  Relationships  . Social connections:    Talks on phone: Not on file    Gets together: Not on file    Attends religious service: Not on file    Active member of club or organization: Not on file    Attends meetings of clubs or organizations: Not on file    Relationship status: Not on file  . Intimate partner violence:    Fear of current or ex partner: Not on file     Emotionally abused: Not on file    Physically abused: Not on file    Forced sexual activity: Not on file  Other Topics Concern  . Not on file  Social History Narrative  . Not on file      Review of Systems  All other systems reviewed and are negative.      Objective:   Physical Exam  Constitutional: He appears well-developed  and well-nourished.  Neck: Neck supple. No JVD present.  Cardiovascular: Normal rate and normal heart sounds. An irregularly irregular rhythm present.  No murmur heard. Pulmonary/Chest: Effort normal. No respiratory distress. He has no wheezes. He has rales in the left lower field.  Abdominal: Soft. Bowel sounds are normal. He exhibits no distension. There is no tenderness. There is no rebound.  Musculoskeletal: He exhibits edema.  Vitals reviewed.         Assessment & Plan:  Leg swelling, diastolic CHF, chronic renal insufficiency, chronic venous stasis dermatitis.  Temporarily increase Lasix to 80 mg twice daily.  Supplement potassium with 20 mEq daily.  Patient has samples of these at home and can up the dose without any prescription.  Patient was placed in bilateral Unna boots for compression.  Recheck the patient first of next week to reassess kidney function and to monitor diuresis and leg swelling.

## 2018-01-03 ENCOUNTER — Other Ambulatory Visit: Payer: Self-pay | Admitting: Family Medicine

## 2018-01-04 ENCOUNTER — Ambulatory Visit: Payer: BLUE CROSS/BLUE SHIELD | Admitting: Family Medicine

## 2018-01-04 ENCOUNTER — Encounter: Payer: Self-pay | Admitting: Family Medicine

## 2018-01-04 VITALS — BP 128/74 | HR 68 | Temp 98.0°F | Resp 20 | Ht 73.0 in | Wt 326.0 lb

## 2018-01-04 DIAGNOSIS — N183 Chronic kidney disease, stage 3 unspecified: Secondary | ICD-10-CM

## 2018-01-04 DIAGNOSIS — I872 Venous insufficiency (chronic) (peripheral): Secondary | ICD-10-CM

## 2018-01-04 DIAGNOSIS — I5032 Chronic diastolic (congestive) heart failure: Secondary | ICD-10-CM

## 2018-01-04 LAB — BASIC METABOLIC PANEL WITH GFR
BUN / CREAT RATIO: 14 (calc) (ref 6–22)
BUN: 23 mg/dL (ref 7–25)
CO2: 32 mmol/L (ref 20–32)
Calcium: 8.9 mg/dL (ref 8.6–10.3)
Chloride: 98 mmol/L (ref 98–110)
Creat: 1.68 mg/dL — ABNORMAL HIGH (ref 0.70–1.25)
GFR, EST AFRICAN AMERICAN: 49 mL/min/{1.73_m2} — AB (ref >=60)
GFR, EST NON AFRICAN AMERICAN: 42 mL/min/{1.73_m2} — AB (ref >=60)
Glucose, Bld: 148 mg/dL — ABNORMAL HIGH (ref 65–99)
POTASSIUM: 4.1 mmol/L (ref 3.5–5.3)
SODIUM: 138 mmol/L (ref 135–146)

## 2018-01-04 LAB — EXTRA LAV TOP TUBE

## 2018-01-04 NOTE — Progress Notes (Signed)
Subjective:    Patient ID: William Alvarado, male    DOB: [birth year], 67 y.o.   MRN: [id number]  HPI  12/21/17 Please see previous office visit.  Patient has gained 9 additional pounds since his last office visit.  He has +2 pitting edema in both legs to the knee.  Both legs have now become violaceous in color as well as the feet due to edema and poor circulation.  He reports increasing dyspnea on exertion and now has left basilar crackles.  We have been holding all diuretics due to his recent acute kidney injury due to dehydration from overuse of diuretics.  However it appears that he is now fluid overloaded on exam and require some diuresis.  At that time, my plan was:  I had originally documented incorrectly in his chart regarding low back pain.  This was unintentional and was related to another patient encounter not pertaining to this individual.  Therefore I have addended his chart and erased all mention of low back pain.  The patient did not fall.  He did not suffer an injury to his lower back.  He has no lower back pain.  I will cancel the x-ray that I ordered incorrectly.  I will also cancel the narcotic prescription that was sent to the pharmacy in [place].  This was incorrectly documented on the wrong patient  Patient is fluid overloaded on exam.  Resume Lasix 40 mg bid and recheck swelling and BMP in 1 week.  He had previously tolerated this dose without complications for more than 6 months.    12/31/17 Patient presents today with worsening leg swelling.  He has +2/10 pitting edema in both legs all the way to the knees.  This is despite taking Lasix 40 mg twice daily.  Both legs are red.  He reports pain in both legs due to the swelling.  There is weeping edema coming from the lateral aspect of his right shin. Wt Readings from Last 3 Encounters:  12/31/17 (!) 327 lb (148.3 kg)  12/21/17 (!) 329 lb (149.2 kg)  12/16/17 (!) 318 lb (144.2 kg)   He denies any chest pain.  He denies any  shortness of breath.  He denies any dyspnea on exertion however his leg swelling is worse.  And he is developing a venous stasis ulcer on the right lateral shin.  At that time, my plan was: Temporarily increase Lasix to 80 mg twice daily.  Supplement potassium with 20 mEq daily.  Patient has samples of these at home and can up the dose without any prescription.  Patient was placed in bilateral Unna boots for compression.  Recheck the patient first of next week to reassess kidney function and to monitor diuresis and leg swelling.  01/04/18 Patient's legs look remarkably better today.  Both Unna boots are removed.  Swelling has diminished greatly.  Weight is essentially unchanged however the redness swelling and pain distal to his knee has resolved.  He still has edema in his legs and he does not feel that the amount of swelling will allow him to resume wearing his compression hose.  He would like to replace the Unna boots 1 additional time before discontinuing them altogether.  Past Medical History:  Diagnosis Date  . Acute respiratory failure with hypoxia (HCC)    Hospitalized with acute respiratory failure secondary to CAP, diastolic CHF, and pulmonary embolism  . AKI (acute kidney injury) (HCC)   . Allergy    allergic rhinitis  .  Altered mental status   . Atrial fibrillation (HCC)   . Chronic anticoagulation 03/26/2017   Failed Coumadin (PE with therapeutic INR). Now on Xarelto  . Colon polyps   . Community acquired pneumonia of right lower lobe of lung (HCC)   . COPD (chronic obstructive pulmonary disease) (HCC)   . Degenerative disc disease, lumbar   . Diabetes mellitus without complication (HCC)   . History of pulmonary embolus (PE) 03/26/2017  . Hypertension   . Mitral regurgitation 12/26/2012  . Obesity (BMI 30-39.9) 12/09/2012  . PAF (paroxysmal atrial fibrillation) (HCC)    DCCV in 2014 with recurrent PAF in setting of respiratory failure June 2018. S/P DCCV 04/13/17 to NSR  . Persistent  atrial fibrillation (HCC)   . Pneumonia ~ 2016  . Sepsis (HCC) 03/10/2017  . Sleep apnea   . Systolic murmur   . Type II diabetes mellitus (HCC)    Past Surgical History:  Procedure Laterality Date  . CARDIOVERSION N/A 01/09/2013   Procedure: CARDIOVERSION;  Surgeon: Pricilla Riffle, MD;  Location: [facility] ENDOSCOPY;  Service: Cardiovascular;  Laterality: N/A;  . CARDIOVERSION N/A 04/13/2017   Procedure: CARDIOVERSION;  Surgeon: Quintella Reichert, MD;  Location: [facility] ENDOSCOPY;  Service: Cardiovascular;  Laterality: N/A;  . TEE WITHOUT CARDIOVERSION N/A 01/09/2013   Procedure: TRANSESOPHAGEAL ECHOCARDIOGRAM (TEE);  Surgeon: Pricilla Riffle, MD;  Location: [facility] ENDOSCOPY;  Service: Cardiovascular;  Laterality: N/A;   Current Outpatient Medications on File Prior to Visit  Medication Sig Dispense Refill  . Dulaglutide (TRULICITY) 1.5 MG/0.5ML SOPN Inject 1.5 mg into the skin once a week. 2 mL 3  . fish oil-omega-3 fatty acids 1000 MG capsule Take 1 g by mouth daily.    . furosemide (LASIX) 40 MG tablet Take 40 mg by mouth.    . metoprolol tartrate (LOPRESSOR) 50 MG tablet Take 1 tablet (50 mg total) by mouth 2 (two) times daily. 60 tablet 11  . rivaroxaban (XARELTO) 20 MG TABS tablet Take 1 tablet (20 mg total) by mouth daily with supper. 90 tablet 3  . sitaGLIPtin (JANUVIA) 100 MG tablet Take 1 tablet (100 mg total) daily by mouth. 30 tablet 3   No current facility-administered medications on file prior to visit.    No Known Allergies Social History   Socioeconomic History  . Marital status: Married    Spouse name: Not on file  . Number of children: 2  . Years of education: Not on file  . Highest education level: Not on file  Occupational History    Comment: Machinist  Social Needs  . Financial resource strain: Not on file  . Food insecurity:    Worry: Not on file    Inability: Not on file  . Transportation needs:    Medical: Not on file    Non-medical: Not on file  Tobacco Use  . Smoking  status: Former Smoker    Packs/day: 1.00    Years: 43.00    Pack years: 43.00    Types: Cigarettes    Last attempt to quit: 03/08/2017    Years since quitting: 0.8  . Smokeless tobacco: Never Used  Substance and Sexual Activity  . Alcohol use: No    Comment: Rare  . Drug use: No  . Sexual activity: Not Currently  Lifestyle  . Physical activity:    Days per week: Not on file    Minutes per session: Not on file  . Stress: Not on file  Relationships  . Social connections:  Talks on phone: Not on file    Gets together: Not on file    Attends religious service: Not on file    Active member of club or organization: Not on file    Attends meetings of clubs or organizations: Not on file    Relationship status: Not on file  . Intimate partner violence:    Fear of current or ex partner: Not on file    Emotionally abused: Not on file    Physically abused: Not on file    Forced sexual activity: Not on file  Other Topics Concern  . Not on file  Social History Narrative  . Not on file      Review of Systems  All other systems reviewed and are negative.      Objective:   Physical Exam  Constitutional: He appears well-developed and well-nourished.  Neck: Neck supple. No JVD present.  Cardiovascular: Normal rate and normal heart sounds. An irregularly irregular rhythm present.  No murmur heard. Pulmonary/Chest: Effort normal. No respiratory distress. He has no wheezes.  Abdominal: Soft. Bowel sounds are normal. He exhibits no distension. There is no tenderness. There is no rebound.  Musculoskeletal: He exhibits edema.  Vitals reviewed.         Assessment & Plan:  CHF (congestive heart failure), NYHA class II, chronic, diastolic (HCC)  Chronic venous insufficiency  Venous stasis dermatitis of both lower extremities  CKD (chronic kidney disease) stage 3, GFR 30-59 ml/min (HCC) - Plan: BASIC METABOLIC PANEL WITH GFR  Patient was placed in Unna boots bilaterally both  his right and left leg.  I have recommended that he continue to wear these for an additional 3 days and then remove the Unna boot and switch over to his regular knee-high compression hose in an effort to prevent this moving forward.  The patient had not been wearing his compression hose prior to this because his legs were so swollen after his hospitalization he cannot put them on.  However the edema has improved after wearing the Unna boots and he believes that he is almost back to the point where he could resume wearing his compression hose.  Continue Lasix 80 mg twice daily but recheck BMP today to monitor kidney function closely.  We may need to make changes in his diuretic dose based on the results of his kidney function test.

## 2018-01-11 ENCOUNTER — Other Ambulatory Visit: Payer: Self-pay | Admitting: Family Medicine

## 2018-01-11 DIAGNOSIS — I5032 Chronic diastolic (congestive) heart failure: Secondary | ICD-10-CM

## 2018-01-11 DIAGNOSIS — N289 Disorder of kidney and ureter, unspecified: Secondary | ICD-10-CM

## 2018-01-18 ENCOUNTER — Other Ambulatory Visit: Payer: Self-pay | Admitting: Family Medicine

## 2018-01-20 ENCOUNTER — Other Ambulatory Visit: Payer: BLUE CROSS/BLUE SHIELD

## 2018-01-20 DIAGNOSIS — I5032 Chronic diastolic (congestive) heart failure: Secondary | ICD-10-CM

## 2018-01-20 DIAGNOSIS — N289 Disorder of kidney and ureter, unspecified: Secondary | ICD-10-CM

## 2018-01-20 LAB — BASIC METABOLIC PANEL
BUN / CREAT RATIO: 13 (calc) (ref 6–22)
BUN: 22 mg/dL (ref 7–25)
CALCIUM: 9.3 mg/dL (ref 8.6–10.3)
CO2: 32 mmol/L (ref 20–32)
Chloride: 96 mmol/L — ABNORMAL LOW (ref 98–110)
Creat: 1.64 mg/dL — ABNORMAL HIGH (ref 0.70–1.25)
GLUCOSE: 145 mg/dL — AB (ref 65–99)
Potassium: 4 mmol/L (ref 3.5–5.3)
SODIUM: 135 mmol/L (ref 135–146)

## 2018-01-30 ENCOUNTER — Other Ambulatory Visit: Payer: Self-pay | Admitting: Family Medicine

## 2018-02-16 ENCOUNTER — Other Ambulatory Visit: Payer: Self-pay | Admitting: Family Medicine

## 2018-02-19 ENCOUNTER — Other Ambulatory Visit: Payer: Self-pay | Admitting: Family Medicine

## 2018-02-19 DIAGNOSIS — I2609 Other pulmonary embolism with acute cor pulmonale: Secondary | ICD-10-CM

## 2018-03-21 ENCOUNTER — Encounter: Payer: Self-pay | Admitting: Family Medicine

## 2018-03-21 ENCOUNTER — Ambulatory Visit: Payer: BLUE CROSS/BLUE SHIELD | Admitting: Family Medicine

## 2018-03-21 VITALS — BP 120/76 | HR 90 | Temp 98.1°F | Resp 20 | Ht 73.0 in | Wt 322.0 lb

## 2018-03-21 DIAGNOSIS — E119 Type 2 diabetes mellitus without complications: Secondary | ICD-10-CM | POA: Diagnosis not present

## 2018-03-21 DIAGNOSIS — N183 Chronic kidney disease, stage 3 unspecified: Secondary | ICD-10-CM

## 2018-03-21 DIAGNOSIS — I5032 Chronic diastolic (congestive) heart failure: Secondary | ICD-10-CM | POA: Diagnosis not present

## 2018-03-21 MED ORDER — MOMETASONE FUROATE 0.1 % EX OINT: TOPICAL_OINTMENT | Freq: Every day | CUTANEOUS | 0 refills | Status: DC

## 2018-03-21 NOTE — Progress Notes (Signed)
Subjective:    Patient ID: William Alvarado, male    DOB: [birth year], 67 y.o.   MRN: [id number]  HPI   12/21/17 Please see previous office visit.  Patient has gained 9 additional pounds since his last office visit.  He has +2 pitting edema in both legs to the knee.  Both legs have now become violaceous in color as well as the feet due to edema and poor circulation.  He reports increasing dyspnea on exertion and now has left basilar crackles.  We have been holding all diuretics due to his recent acute kidney injury due to dehydration from overuse of diuretics.  However it appears that he is now fluid overloaded on exam and require some diuresis.  At that time, my plan was:  I had originally documented incorrectly in his chart regarding low back pain.  This was unintentional and was related to another patient encounter not pertaining to this individual.  Therefore I have addended his chart and erased all mention of low back pain.  The patient did not fall.  He did not suffer an injury to his lower back.  He has no lower back pain.  I will cancel the x-ray that I ordered incorrectly.  I will also cancel the narcotic prescription that was sent to the pharmacy in [place].  This was incorrectly documented on the wrong patient  Patient is fluid overloaded on exam.  Resume Lasix 40 mg bid and recheck swelling and BMP in 1 week.  He had previously tolerated this dose without complications for more than 6 months.    12/31/17 Patient presents today with worsening leg swelling.  He has +2/10 pitting edema in both legs all the way to the knees.  This is despite taking Lasix 40 mg twice daily.  Both legs are red.  He reports pain in both legs due to the swelling.  There is weeping edema coming from the lateral aspect of his right shin. Wt Readings from Last 3 Encounters:  03/21/18 (!) 322 lb (146.1 kg)  01/04/18 (!) 326 lb (147.9 kg)  12/31/17 (!) 327 lb (148.3 kg)   He denies any chest pain.  He denies any  shortness of breath.  He denies any dyspnea on exertion however his leg swelling is worse.  And he is developing a venous stasis ulcer on the right lateral shin.  At that time, my plan was: Temporarily increase Lasix to 80 mg twice daily.  Supplement potassium with 20 mEq daily.  Patient has samples of these at home and can up the dose without any prescription.  Patient was placed in bilateral Unna boots for compression.  Recheck the patient first of next week to reassess kidney function and to monitor diuresis and leg swelling.  01/04/18 Patient's legs look remarkably better today.  Both Unna boots are removed.  Swelling has diminished greatly.  Weight is essentially unchanged however the redness swelling and pain distal to his knee has resolved.  He still has edema in his legs and he does not feel that the amount of swelling will allow him to resume wearing his compression hose.  He would like to replace the Unna boots 1 additional time before discontinuing them altogether.  At that time, my plan was: Patient was placed in Unna boots bilaterally both his right and left leg.  I have recommended that he continue to wear these for an additional 3 days and then remove the Unna boot and switch over to his regular knee-high  compression hose in an effort to prevent this moving forward.  The patient had not been wearing his compression hose prior to this because his legs were so swollen after his hospitalization he cannot put them on.  However the edema has improved after wearing the Unna boots and he believes that he is almost back to the point where he could resume wearing his compression hose.  Continue Lasix 80 mg twice daily but recheck BMP today to monitor kidney function closely.  We may need to make changes in his diuretic dose based on the results of his kidney function test.  03/21/18 Patient is here today for follow-up.  He states that he gets winded easily.  He has very little exercise tolerance.  He  reports shortness of breath with minimal exertion.  He is not exercising due to this.  He is not following his diet.  His weight remains relatively stable.  He has lost 4 pounds since his last visit.  He has +1 pitting edema in both legs along with chronic venous stasis changes in the skin overlying both legs including erythema and dermal fibrosis.  On pulmonary exam today, he has faint left basilar crackles otherwise his lungs are clear to auscultation.  He is not smoking.  He denies any orthopnea.  He has a large abdomen due to obesity.  This is also clearly affecting his breathing and his exercise tolerance.  He denies any hypoglycemia.  He denies any polyuria, polydipsia, or blurry vision.  He continues to be maintained on Lasix 80 mg twice daily.  His last renal function was improved and is back to his baseline.  He is here today for follow-up of his diabetes and to monitor his renal function.  His biggest concern in addition to his shortness of breath and easy fatigability is diffuse itching all over his skin.  This is been present ever since he worked underneath his house spreading wrap poison.  There is no visible rash but he is itching on his abdomen, both shoulders, and on his triceps.  Past Medical History:  Diagnosis Date  . Acute respiratory failure with hypoxia (HCC)    Hospitalized with acute respiratory failure secondary to CAP, diastolic CHF, and pulmonary embolism  . AKI (acute kidney injury) (HCC)   . Allergy    allergic rhinitis  . Altered mental status   . Atrial fibrillation (HCC)   . Chronic anticoagulation 03/26/2017   Failed Coumadin (PE with therapeutic INR). Now on Xarelto  . Colon polyps   . Community acquired pneumonia of right lower lobe of lung (HCC)   . COPD (chronic obstructive pulmonary disease) (HCC)   . Degenerative disc disease, lumbar   . Diabetes mellitus without complication (HCC)   . History of pulmonary embolus (PE) 03/26/2017  . Hypertension   . Mitral  regurgitation 12/26/2012  . Obesity (BMI 30-39.9) 12/09/2012  . PAF (paroxysmal atrial fibrillation) (HCC)    DCCV in 2014 with recurrent PAF in setting of respiratory failure June 2018. S/P DCCV 04/13/17 to NSR  . Persistent atrial fibrillation (HCC)   . Pneumonia ~ 2016  . Sepsis (HCC) 03/10/2017  . Sleep apnea   . Systolic murmur   . Type II diabetes mellitus (HCC)    Past Surgical History:  Procedure Laterality Date  . CARDIOVERSION N/A 01/09/2013   Procedure: CARDIOVERSION;  Surgeon: Pricilla Riffle, MD;  Location: [facility] ENDOSCOPY;  Service: Cardiovascular;  Laterality: N/A;  . CARDIOVERSION N/A 04/13/2017   Procedure: CARDIOVERSION;  Surgeon: Quintella Reichert, MD;  Location: [facility] ENDOSCOPY;  Service: Cardiovascular;  Laterality: N/A;  . TEE WITHOUT CARDIOVERSION N/A 01/09/2013   Procedure: TRANSESOPHAGEAL ECHOCARDIOGRAM (TEE);  Surgeon: Pricilla Riffle, MD;  Location: [facility] ENDOSCOPY;  Service: Cardiovascular;  Laterality: N/A;   Current Outpatient Medications on File Prior to Visit  Medication Sig Dispense Refill  . Dulaglutide (TRULICITY) 1.5 MG/0.5ML SOPN Inject 1.5 mg into the skin once a week. 2 mL 3  . fish oil-omega-3 fatty acids 1000 MG capsule Take 1 g by mouth daily.    . furosemide (LASIX) 40 MG tablet Take 80 mg by mouth 2 (two) times daily.     Marland Kitchen JANUVIA 100 MG tablet TAKE 1 TABLET BY MOUTH ONCE DAILY 90 tablet 1  . losartan (COZAAR) 100 MG tablet TAKE 1 TABLET BY MOUTH ONCE DAILY 90 tablet 3  . metoprolol tartrate (LOPRESSOR) 50 MG tablet Take 1 tablet (50 mg total) by mouth 2 (two) times daily. 60 tablet 11  . potassium chloride SA (K-DUR,KLOR-CON) 20 MEQ tablet Take 20 mEq by mouth daily.    . rivaroxaban (XARELTO) 20 MG TABS tablet Take 1 tablet (20 mg total) by mouth daily with supper. 90 tablet 3   No current facility-administered medications on file prior to visit.    No Known Allergies Social History   Socioeconomic History  . Marital status: Married    Spouse name: Not on  file  . Number of children: 2  . Years of education: Not on file  . Highest education level: Not on file  Occupational History    Comment: Machinist  Social Needs  . Financial resource strain: Not on file  . Food insecurity:    Worry: Not on file    Inability: Not on file  . Transportation needs:    Medical: Not on file    Non-medical: Not on file  Tobacco Use  . Smoking status: Former Smoker    Packs/day: 1.00    Years: 43.00    Pack years: 43.00    Types: Cigarettes    Last attempt to quit: 03/08/2017    Years since quitting: 1.0  . Smokeless tobacco: Never Used  Substance and Sexual Activity  . Alcohol use: No    Comment: Rare  . Drug use: No  . Sexual activity: Not Currently  Lifestyle  . Physical activity:    Days per week: Not on file    Minutes per session: Not on file  . Stress: Not on file  Relationships  . Social connections:    Talks on phone: Not on file    Gets together: Not on file    Attends religious service: Not on file    Active member of club or organization: Not on file    Attends meetings of clubs or organizations: Not on file    Relationship status: Not on file  . Intimate partner violence:    Fear of current or ex partner: Not on file    Emotionally abused: Not on file    Physically abused: Not on file    Forced sexual activity: Not on file  Other Topics Concern  . Not on file  Social History Narrative  . Not on file      Review of Systems  Skin: Positive for rash.  All other systems reviewed and are negative.      Objective:   Physical Exam  Constitutional: He appears well-developed and well-nourished.  Neck: Neck supple. No JVD present.  Cardiovascular: Normal rate and normal heart sounds. An irregularly irregular rhythm present.  No murmur heard. Pulmonary/Chest: Effort normal. No respiratory distress. He has no wheezes.  Abdominal: Soft. Bowel sounds are normal. He exhibits no distension. There is no tenderness. There is no  rebound.  Musculoskeletal: He exhibits edema.  Vitals reviewed.         Assessment & Plan:  CHF (congestive heart failure), NYHA class II, chronic, diastolic (HCC) - Plan: Brain natriuretic peptide, CBC with Differential/Platelet, COMPLETE METABOLIC PANEL WITH GFR  CKD (chronic kidney disease) stage 3, GFR 30-59 ml/min (HCC)  Diabetes mellitus without complication (HCC) - Plan: Hemoglobin A1c  Patient does not appear to be fluid overloaded on exam however I will check a BNP to assure this.  I believe that he is euvolemic and that we should not make any changes in his dose of Lasix.  I will monitor his renal function given his history of chronic kidney disease and acute renal failure.  I will also recheck a hemoglobin A1c to ensure management of his diabetes.  Goal hemoglobin A1c for this patient is less than 7.  Blood pressure is acceptable.  We need to focus on weight loss to improve his breathing and his dyspnea on exertion.  We discussed gastric bypass today.  I do not feel that any further weight loss medications are safe option for this patient given his cardiovascular history.  Furthermore given his chronic kidney disease, I do not believe that he would be a good candidate for an SGLT2 medication.  I did give the patient Elocon ointment to apply to the skin once daily for the next week for what I perceived to be a mild contact dermatitis

## 2018-03-22 LAB — COMPLETE METABOLIC PANEL WITH GFR
AG Ratio: 1.2 (calc) (ref 1.0–2.5)
ALBUMIN MSPROF: 3.9 g/dL (ref 3.6–5.1)
ALT: 8 U/L — AB (ref 9–46)
AST: 14 U/L (ref 10–35)
Alkaline phosphatase (APISO): 65 U/L (ref 40–115)
BUN/Creatinine Ratio: 14 (calc) (ref 6–22)
BUN: 21 mg/dL (ref 7–25)
CALCIUM: 9 mg/dL (ref 8.6–10.3)
CHLORIDE: 98 mmol/L (ref 98–110)
CO2: 34 mmol/L — AB (ref 20–32)
CREATININE: 1.54 mg/dL — AB (ref 0.70–1.25)
GFR, EST AFRICAN AMERICAN: 54 mL/min/{1.73_m2} — AB (ref >=60)
GFR, Est Non African American: 47 mL/min/{1.73_m2} — ABNORMAL LOW (ref >=60)
GLUCOSE: 150 mg/dL — AB (ref 65–99)
Globulin: 3.2 g/dL (calc) (ref 1.9–3.7)
Potassium: 3.8 mmol/L (ref 3.5–5.3)
Sodium: 139 mmol/L (ref 135–146)
TOTAL PROTEIN: 7.1 g/dL (ref 6.1–8.1)
Total Bilirubin: 0.7 mg/dL (ref 0.2–1.2)

## 2018-03-22 LAB — HEMOGLOBIN A1C
EAG (MMOL/L): 8.4 (calc)
Hgb A1c MFr Bld: 6.9 % of total Hgb — ABNORMAL HIGH (ref <5.7)
Mean Plasma Glucose: 151 (calc)

## 2018-03-22 LAB — BRAIN NATRIURETIC PEPTIDE: Brain Natriuretic Peptide: 285 pg/mL — ABNORMAL HIGH (ref <100)

## 2018-03-22 LAB — CBC WITH DIFFERENTIAL/PLATELET
BASOS ABS: 37 {cells}/uL (ref 0–200)
BASOS PCT: 0.4 %
Eosinophils Absolute: 147 cells/uL (ref 15–500)
Eosinophils Relative: 1.6 %
HCT: 43.4 % (ref 38.5–50.0)
HEMOGLOBIN: 13.7 g/dL (ref 13.2–17.1)
LYMPHS ABS: 1168 {cells}/uL (ref 850–3900)
MCH: 26.1 pg — ABNORMAL LOW (ref 27.0–33.0)
MCHC: 31.6 g/dL — ABNORMAL LOW (ref 32.0–36.0)
MCV: 82.7 fL (ref 80.0–100.0)
MPV: 10.5 fL (ref 7.5–12.5)
Monocytes Relative: 8.6 %
NEUTROS PCT: 76.7 %
Neutro Abs: 7056 cells/uL (ref 1500–7800)
PLATELETS: 320 10*3/uL (ref 140–400)
RBC: 5.25 10*6/uL (ref 4.20–5.80)
RDW: 16 % — ABNORMAL HIGH (ref 11.0–15.0)
Total Lymphocyte: 12.7 %
WBC: 9.2 10*3/uL (ref 3.8–10.8)
WBCMIX: 791 {cells}/uL (ref 200–950)

## 2018-03-23 IMAGING — CR DG CHEST 2V
2 series · 2 of 2 positions shown · non-contrast
Comparison: 04/07/2017

CLINICAL DATA: Shortness of Breath

EXAM:
CHEST  2 VIEW

[w chest pa]
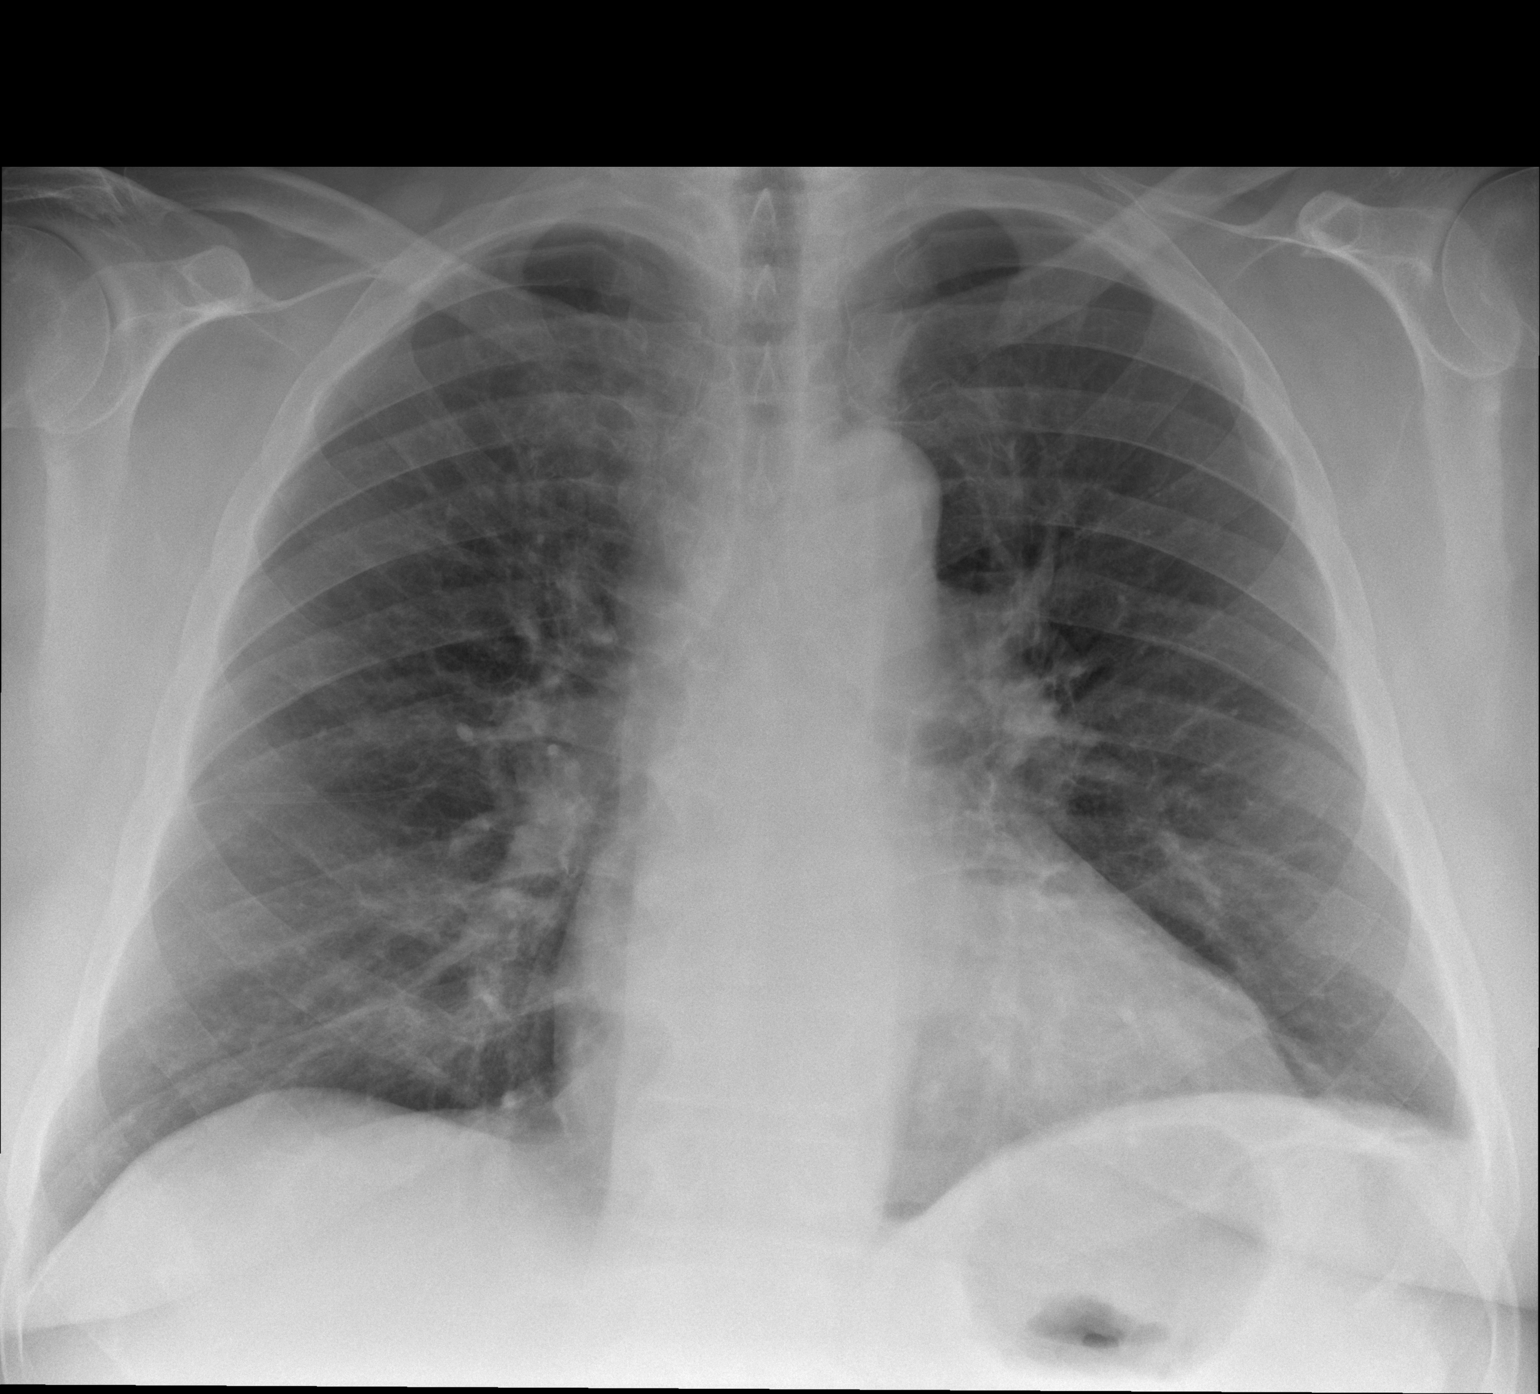

[w chest lat]
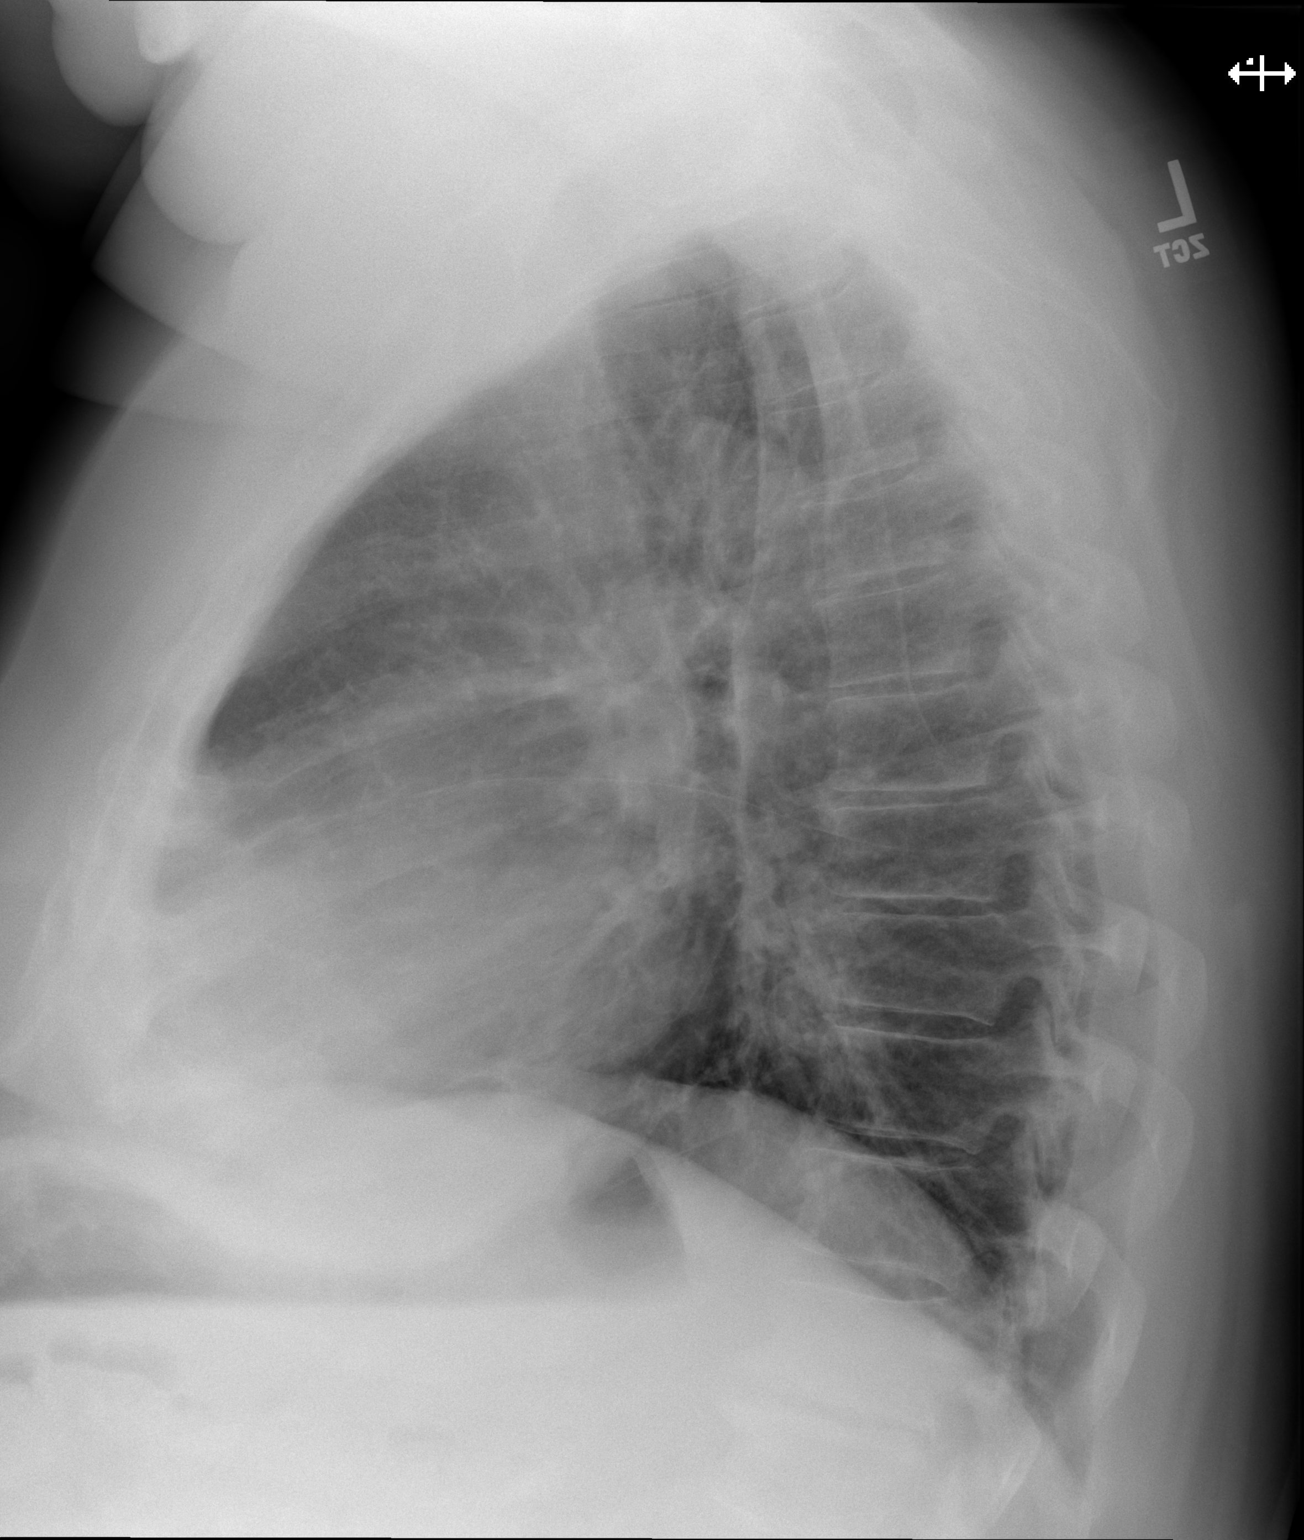

[2 of 2 positions shown; findings below may reference images not displayed]

FINDINGS: Cardiac shadow is at the upper limits of normal in size. The lungs
are well aerated bilaterally. No focal infiltrate or sizable
effusion is seen. No bony abnormality is noted.
IMPRESSION: No active cardiopulmonary disease.

## 2018-04-12 ENCOUNTER — Other Ambulatory Visit: Payer: Self-pay | Admitting: Family Medicine

## 2018-04-20 NOTE — Progress Notes (Signed)
HPI: Fu atrial fibrillation. Patient underwent cardioversion in 2014. Echocardiogram June 2018 showed normal LV function, mild aortic stenosis with mean gradient 9 mmHg, trace aortic insufficiency, mild mitral stenosis/mild mitral regurgitation, biatrial enlargement. ABIs June 2018 normal. CTA June 2018 showed small nonocclusive pulmonary embolus. Placed on anticoagulation. Patient noted to be in atrial fibrillation at office visit in June 2018. Underwent successful cardioversion July 2018. Since last seen,  has some fatigue and dyspnea with exertion but no orthopnea.  He denies chest pain, palpitations, syncope or bleeding.  Current Outpatient Medications  Medication Sig Dispense Refill  . fish oil-omega-3 fatty acids 1000 MG capsule Take 1 g by mouth daily.    . furosemide (LASIX) 40 MG tablet Take 80 mg by mouth 2 (two) times daily.     Marland Kitchen JANUVIA 100 MG tablet TAKE 1 TABLET BY MOUTH ONCE DAILY 90 tablet 1  . losartan (COZAAR) 100 MG tablet TAKE 1 TABLET BY MOUTH ONCE DAILY 90 tablet 3  . potassium chloride SA (K-DUR,KLOR-CON) 20 MEQ tablet Take 20 mEq by mouth daily.    . rivaroxaban (XARELTO) 20 MG TABS tablet Take 1 tablet (20 mg total) by mouth daily with supper. 90 tablet 3  . TRULICITY 1.5 MG/0.5ML SOPN INJECT 1.5 MG INTO THE SKIN ONCE A WEEK 4 pen 3  . metoprolol tartrate (LOPRESSOR) 50 MG tablet Take 1 tablet (50 mg total) by mouth 2 (two) times daily. 60 tablet 11   No current facility-administered medications for this visit.      Past Medical History:  Diagnosis Date  . Acute respiratory failure with hypoxia (HCC)    Hospitalized with acute respiratory failure secondary to CAP, diastolic CHF, and pulmonary embolism  . AKI (acute kidney injury) (HCC)   . Allergy    allergic rhinitis  . Altered mental status   . Atrial fibrillation (HCC)   . Chronic anticoagulation 03/26/2017   Failed Coumadin (PE with therapeutic INR). Now on Xarelto  . Colon polyps   . Community  acquired pneumonia of right lower lobe of lung (HCC)   . COPD (chronic obstructive pulmonary disease) (HCC)   . Degenerative disc disease, lumbar   . Diabetes mellitus without complication (HCC)   . History of pulmonary embolus (PE) 03/26/2017  . Hypertension   . Mitral regurgitation 12/26/2012  . Obesity (BMI 30-39.9) 12/09/2012  . PAF (paroxysmal atrial fibrillation) (HCC)    DCCV in 2014 with recurrent PAF in setting of respiratory failure June 2018. S/P DCCV 04/13/17 to NSR  . Persistent atrial fibrillation (HCC)   . Pneumonia ~ 2016  . Sepsis (HCC) 03/10/2017  . Sleep apnea   . Systolic murmur   . Type II diabetes mellitus (HCC)     Past Surgical History:  Procedure Laterality Date  . CARDIOVERSION N/A 01/09/2013   Procedure: CARDIOVERSION;  Surgeon: Pricilla Riffle, MD;  Location: [facility] ENDOSCOPY;  Service: Cardiovascular;  Laterality: N/A;  . CARDIOVERSION N/A 04/13/2017   Procedure: CARDIOVERSION;  Surgeon: Quintella Reichert, MD;  Location: [facility] ENDOSCOPY;  Service: Cardiovascular;  Laterality: N/A;  . TEE WITHOUT CARDIOVERSION N/A 01/09/2013   Procedure: TRANSESOPHAGEAL ECHOCARDIOGRAM (TEE);  Surgeon: Pricilla Riffle, MD;  Location: [facility] ENDOSCOPY;  Service: Cardiovascular;  Laterality: N/A;    Social History   Socioeconomic History  . Marital status: Married    Spouse name: Not on file  . Number of children: 2  . Years of education: Not on file  . Highest education level: Not  on file  Occupational History    Comment: Machinist  Social Needs  . Financial resource strain: Not on file  . Food insecurity:    Worry: Not on file    Inability: Not on file  . Transportation needs:    Medical: Not on file    Non-medical: Not on file  Tobacco Use  . Smoking status: Former Smoker    Packs/day: 1.00    Years: 43.00    Pack years: 43.00    Types: Cigarettes    Last attempt to quit: 03/08/2017    Years since quitting: 1.1  . Smokeless tobacco: Never Used  Substance and Sexual Activity  .  Alcohol use: No    Comment: Rare  . Drug use: No  . Sexual activity: Not Currently  Lifestyle  . Physical activity:    Days per week: Not on file    Minutes per session: Not on file  . Stress: Not on file  Relationships  . Social connections:    Talks on phone: Not on file    Gets together: Not on file    Attends religious service: Not on file    Active member of club or organization: Not on file    Attends meetings of clubs or organizations: Not on file    Relationship status: Not on file  . Intimate partner violence:    Fear of current or ex partner: Not on file    Emotionally abused: Not on file    Physically abused: Not on file    Forced sexual activity: Not on file  Other Topics Concern  . Not on file  Social History Narrative  . Not on file    Family History  Problem Relation Age of Onset  . Cancer Mother 67       breast  . Heart disease Father 9       MI  . Cancer Brother        lung  . Stroke Brother 45    ROS: no fevers or chills, productive cough, hemoptysis, dysphasia, odynophagia, melena, hematochezia, dysuria, hematuria, rash, seizure activity, orthopnea, PND, pedal edema, claudication. Remaining systems are negative.  Physical Exam: Well-developed morbidly obese in no acute distress.  Skin is warm and dry.  HEENT is normal.  Neck is supple.  Chest is clear to auscultation with normal expansion.  Cardiovascular exam is regular rate and rhythm.  Abdominal exam nontender or distended. No masses palpated. Extremities show trace edema. neuro grossly intact  A/P  1 paroxysmal atrial fibrillation-patient remains in sinus rhythm.  Continue metoprolol for rate control if atrial fibrillation recurs.  Continue Xarelto.   2 hypertension-blood pressure is controlled.  Continue present medications.  3 history of mild aortic and mitral stenosis-we will plan to repeat echocardiogram.  4 prior pulmonary embolus-continue anticoagulation which is also being  used for atrial fibrillation.  5 chronic diastolic congestive heart failure-patient is euvolemic on examination today.  Continue present dose of diuretic.    6 morbid obesity-we discussed the importance of weight loss.  Olga Millers, MD

## 2018-04-26 ENCOUNTER — Ambulatory Visit: Payer: Medicare HMO | Admitting: Cardiology

## 2018-04-26 ENCOUNTER — Encounter: Payer: Self-pay | Admitting: Cardiology

## 2018-04-26 VITALS — BP 108/70 | HR 80 | Ht 73.0 in | Wt 307.6 lb

## 2018-04-26 DIAGNOSIS — I340 Nonrheumatic mitral (valve) insufficiency: Secondary | ICD-10-CM | POA: Diagnosis not present

## 2018-04-26 DIAGNOSIS — I480 Paroxysmal atrial fibrillation: Secondary | ICD-10-CM

## 2018-04-26 DIAGNOSIS — I359 Nonrheumatic aortic valve disorder, unspecified: Secondary | ICD-10-CM

## 2018-04-26 DIAGNOSIS — I10 Essential (primary) hypertension: Secondary | ICD-10-CM | POA: Diagnosis not present

## 2018-04-26 DIAGNOSIS — I5032 Chronic diastolic (congestive) heart failure: Secondary | ICD-10-CM | POA: Diagnosis not present

## 2018-04-26 DIAGNOSIS — I110 Hypertensive heart disease with heart failure: Secondary | ICD-10-CM | POA: Diagnosis not present

## 2018-04-26 NOTE — Patient Instructions (Signed)

## 2018-05-02 ENCOUNTER — Other Ambulatory Visit: Payer: Self-pay

## 2018-05-02 ENCOUNTER — Ambulatory Visit (HOSPITAL_COMMUNITY): Payer: Medicare HMO | Attending: Cardiology

## 2018-05-02 DIAGNOSIS — Z86711 Personal history of pulmonary embolism: Secondary | ICD-10-CM | POA: Diagnosis not present

## 2018-05-02 DIAGNOSIS — G4730 Sleep apnea, unspecified: Secondary | ICD-10-CM | POA: Diagnosis not present

## 2018-05-02 DIAGNOSIS — I080 Rheumatic disorders of both mitral and aortic valves: Secondary | ICD-10-CM | POA: Diagnosis not present

## 2018-05-02 DIAGNOSIS — I4891 Unspecified atrial fibrillation: Secondary | ICD-10-CM | POA: Diagnosis not present

## 2018-05-02 DIAGNOSIS — I10 Essential (primary) hypertension: Secondary | ICD-10-CM | POA: Insufficient documentation

## 2018-05-02 DIAGNOSIS — I340 Nonrheumatic mitral (valve) insufficiency: Secondary | ICD-10-CM

## 2018-05-02 DIAGNOSIS — I359 Nonrheumatic aortic valve disorder, unspecified: Secondary | ICD-10-CM

## 2018-05-24 ENCOUNTER — Other Ambulatory Visit: Payer: Self-pay | Admitting: Family Medicine

## 2018-07-15 ENCOUNTER — Other Ambulatory Visit: Payer: Self-pay | Admitting: Family Medicine

## 2018-07-15 MED ORDER — DULAGLUTIDE 1.5 MG/0.5ML ~~LOC~~ SOAJ: SUBCUTANEOUS | 3 refills | Status: DC

## 2018-07-21 ENCOUNTER — Other Ambulatory Visit: Payer: Self-pay | Admitting: Family Medicine

## 2018-07-21 MED ORDER — FUROSEMIDE 40 MG PO TABS: 80.0000 mg | ORAL_TABLET | Freq: Two times a day (BID) | ORAL | 3 refills | Status: DC

## 2018-07-21 MED ORDER — POTASSIUM CHLORIDE CRYS ER 20 MEQ PO TBCR: 20.0000 meq | EXTENDED_RELEASE_TABLET | Freq: Every day | ORAL | 3 refills | Status: DC

## 2018-07-21 MED ORDER — METOPROLOL TARTRATE 50 MG PO TABS: 50.0000 mg | ORAL_TABLET | Freq: Two times a day (BID) | ORAL | 3 refills | Status: DC

## 2018-07-27 ENCOUNTER — Other Ambulatory Visit: Payer: Self-pay | Admitting: Family Medicine

## 2018-07-29 ENCOUNTER — Ambulatory Visit (INDEPENDENT_AMBULATORY_CARE_PROVIDER_SITE_OTHER): Payer: Medicare HMO

## 2018-07-29 DIAGNOSIS — Z23 Encounter for immunization: Secondary | ICD-10-CM

## 2018-07-29 NOTE — Progress Notes (Signed)
Patient was in office for flu vaccine,patient received vaccine in his right deltoid.Patient tolerated well

## 2018-08-30 ENCOUNTER — Other Ambulatory Visit: Payer: Self-pay | Admitting: *Deleted

## 2018-08-30 MED ORDER — LOSARTAN POTASSIUM 100 MG PO TABS: 100.0000 mg | ORAL_TABLET | Freq: Every day | ORAL | 3 refills | Status: DC

## 2018-09-05 ENCOUNTER — Other Ambulatory Visit: Payer: Self-pay | Admitting: *Deleted

## 2018-09-05 MED ORDER — LOSARTAN POTASSIUM 100 MG PO TABS: 100.0000 mg | ORAL_TABLET | Freq: Every day | ORAL | 3 refills | Status: DC

## 2018-09-25 ENCOUNTER — Other Ambulatory Visit: Payer: Self-pay | Admitting: Family Medicine

## 2018-09-28 ENCOUNTER — Telehealth: Payer: Self-pay | Admitting: *Deleted

## 2018-09-28 NOTE — Telephone Encounter (Signed)
Received call from patient.   Inquired as to if he could take Aleve for BLE pain. States that he has been taking (2) APAP 325mg  with relief. Advised that he has decreased kidney function on last labs, so would not use more than (1) tab PO QD.   MD to be made aware.

## 2018-11-04 ENCOUNTER — Other Ambulatory Visit: Payer: Self-pay

## 2018-11-04 NOTE — Patient Outreach (Signed)
  [facility]) Care Management Chronic Special Needs Program   11/04/2018  Name: William Alvarado, DOB: 1951/07/23  MRN: [id number]  The client was discussed in 10/28/18's interdisciplinary care team meeting. The client's individualized care plan was developed based on completed Health Risk Assessment The following issues were discussed:  Client's needs, Key risk triggers/risk stratification, Care Plan and Issues/barriers to care  Participants present:   Jodean Lima, MSN, RN, CCM, CNS    Kathyrn Sheriff, MSN, RN, CCM   Damoni Erker RN,BSN,CCM, CDE       Recommendations:  Send Advanced directive packet, education on DM, HTN, Atrial fib refer to pharmacy for assess issues  Plan:  Plan to send copy of individualized care plan to client Plan to send individualized care plan to provider. Plan to refer to pharmacy for assess issues Chronic Care Management Coordinator will outreach in 1-2 months  Follow-up:  1-2 months Dudley Major RN, Maximiano Coss, CDE Chronic Care Management Coordinator [facility] Care Management [phone]

## 2018-11-07 ENCOUNTER — Other Ambulatory Visit: Payer: Self-pay | Admitting: Pharmacist

## 2018-11-07 ENCOUNTER — Ambulatory Visit: Payer: Self-pay | Admitting: Pharmacist

## 2018-11-07 ENCOUNTER — Other Ambulatory Visit: Payer: Self-pay | Admitting: Pharmacy Technician

## 2018-11-07 NOTE — Patient Outreach (Signed)
[facility]) Care Management  11/07/2018  William Alvarado 1951-02-21 [id number]                                                  Medication Assistance Referral  Referral From: [facility] RPh Kandra Nicolas  Medication/Company: Alma Friendly  / [employer] Patient application portion:  Mailed Provider application portion: Interoffice Mailed to Dr. Tanya Nones  Medication/Company: Trudee Kuster / Julious O[employer] Patient application portion:  Mailed Provider application portion: Faxed  to Dr. Tanya Nones  Follow up:  Will follow up with patient in 5-7 business days to confirm application(s) have been received.  Suzan Slick Effie Shy CPhT Certified Pharmacy Technician [facility] Care Management Direct Dial:[phone]

## 2018-11-07 NOTE — Patient Outreach (Deleted)
[facility]) Care Management  [facility]  11/07/2018  William Alvarado 07-17-1951 [id number]   Reason for call: medication management  Janurary 5th-90 supply [facility] Lasix 40 BID got free metoprolol 50mg  Bid Losartan 100mg  QD Potassium Xarelto 20mg   Januvia 100mg   Trulicity--$45 (needs refill)  PAPs for Jnauvia/Trulicity

## 2018-11-08 NOTE — Patient Outreach (Signed)
[name]   11/07/2018  William Alvarado 05/11/1951 [id number]  Reason for referral: Medication Assistance with DM medications & HTA-CSNP med review  Referral source: [facility] RN Current insurance:[employer]  PMHx includes but not limited to:  HTN, Afib, DMT2, diastolicCHF (LVEF 33-29%)  Outreach:  Successful telephone call with Mr. and William Alvarado.  HIPAA identifiers verified.  Patient's wife is very involved with care and aware of patient's medications.  Patient granted permission for wife to speak with [facility] Pharmacist.   Patient agreeable to review medications telephonically.  He states he has recently switched his insurance to HTA-CSNP.  His medications have been affordable thus far, however patient is likely to hit the coverage gap early this year based on current medications.  Patient is agreeable to participate in patient assistance programs through [place] (Musician) and [employer] (Januvia).  Medications updated in Epic.  Patient states reports 100% compliance with medications.  His last A1c was 6.9 on 03/21/2018.  He states his FBG usually are within goal range.  Objective: Lab Results  Component Value Date   CREATININE 1.54 (H) 03/21/2018   CREATININE 1.64 (H) 01/20/2018   CREATININE 1.68 (H) 01/04/2018    Lab Results  Component Value Date   HGBA1C 6.9 (H) 03/21/2018    Lipid Panel     Component Value Date/Time   CHOL 176 11/11/2017 1200   TRIG 201 (H) 11/11/2017 1200   HDL 35 (L) 11/11/2017 1200   CHOLHDL 5.0 (H) 11/11/2017 1200   VLDL 17 01/22/2017 0831   LDLCALC 109 (H) 11/11/2017 1200    BP Readings from Last 3 Encounters:  04/26/18 108/70  03/21/18 120/76  01/04/18 128/74    No Known Allergies  Medications Reviewed Today    Reviewed by Lavera Guise, [facility] (Pharmacist) on 11/07/18 at 67  Med List Status: <None>  Medication Order Taking? Sig Documenting Provider Last Dose Status Informant   Dulaglutide (TRULICITY) 1.5 JJ/8.8CZ SOPN [id number]  INJECT 1.5 MG INTO THE SKIN ONCE A WEEK [name], MD  Active   fish oil-omega-3 fatty acids 1000 MG capsule [id number]  Take 1 g by mouth daily. [provider]  Active Spouse/Significant Other  furosemide (LASIX) 40 MG tablet [id number]  Take 2 tablets (80 mg total) by mouth 2 (two) times daily. [name], MD  Active   JANUVIA 100 MG tablet [id number]  TAKE 1 TABLET BY MOUTH ONCE DAILY [name], MD  Active   losartan (COZAAR) 100 MG tablet [id number]  Take 1 tablet (100 mg total) by mouth daily. [place], Modena Nunnery, MD  Active   metoprolol tartrate (LOPRESSOR) 50 MG tablet [id number]  Take 1 tablet (50 mg total) by mouth 2 (two) times daily. [name], MD  Active   potassium chloride SA (K-DUR,KLOR-CON) 20 MEQ tablet [id number]  Take 1 tablet (20 mEq total) by mouth daily. [name], MD  Active   XARELTO 20 MG TABS tablet [id number]  TAKE 1 TABLET BY MOUTH ONCE DAILY WITH SUPPER [name], MD  Active           Assessment:  Drugs sorted by system:  Cardiovascular: furosemide, losartan, metoprolol, Xarelto  Endocrine: Trulicity, Januvia  Vitamins/Minerals/Supplements: potassium, fish oil   Medication Review Findings:  . Medications transferred to [facility] since patient is now with HTA. . Scr appears to be at baseline . Patient has taken statin in the past --> unclear why he  is not on. Will clarify  Medication Assistance Findings:  Extra Help:   '[]'  Already receiving Full Extra Help  '[]'  Already receiving Partial Extra Help  '[]'  Eligible based on reported income and assets  '[x]'  Not Eligible based on reported income and assets  Patient Assistance Programs: 1) Trulicity made by [employer] and Januvia made by [employer] o Income requirement met: '[x]'  Yes '[]'  No '[]'  Unknown o Out-of-pocket prescription expenditure met:    '[]'  Yes '[]'  No  '[]'  Unknown  '[x]'  Not applicable  Plan: I will route  patient assistance letter to [employer] technician who will coordinate patient assistance program application process for medications listed above.  [facility] pharmacy technician will assist with obtaining all required documents from both patient and provider(s) and submit application(s) once completed.   I will follow up with patient next month to review current status    Regina Eck, PharmD, [place]  [phone]

## 2018-11-15 ENCOUNTER — Telehealth: Payer: Self-pay | Admitting: Family Medicine

## 2018-11-15 NOTE — Telephone Encounter (Signed)
Pt needs [place] to send a copy of [facility] [phone]. ATTENTION KAILLYN  He needs refills on furosemide, metoprolol, losartan, and januvia. - [facility]   He also needs xarelto send this one to [facility] in [place].

## 2018-11-16 MED ORDER — LOSARTAN POTASSIUM 100 MG PO TABS: 100.0000 mg | ORAL_TABLET | Freq: Every day | ORAL | 3 refills | Status: DC

## 2018-11-16 MED ORDER — RIVAROXABAN 20 MG PO TABS: ORAL_TABLET | ORAL | 3 refills | Status: AC

## 2018-11-16 MED ORDER — FUROSEMIDE 40 MG PO TABS: 80.0000 mg | ORAL_TABLET | Freq: Two times a day (BID) | ORAL | 3 refills | Status: DC

## 2018-11-16 MED ORDER — SITAGLIPTIN PHOSPHATE 100 MG PO TABS: 100.0000 mg | ORAL_TABLET | Freq: Every day | ORAL | 1 refills | Status: DC

## 2018-11-16 MED ORDER — METOPROLOL TARTRATE 50 MG PO TABS: 50.0000 mg | ORAL_TABLET | Freq: Two times a day (BID) | ORAL | 3 refills | Status: DC

## 2018-11-16 NOTE — Telephone Encounter (Signed)
List faxed, meds send to both pharmacies.

## 2018-11-21 ENCOUNTER — Other Ambulatory Visit: Payer: Self-pay | Admitting: Family Medicine

## 2018-11-21 MED ORDER — FUROSEMIDE 40 MG PO TABS: 80.0000 mg | ORAL_TABLET | Freq: Two times a day (BID) | ORAL | 3 refills | Status: DC

## 2018-11-23 ENCOUNTER — Telehealth: Payer: Self-pay | Admitting: Family Medicine

## 2018-11-23 ENCOUNTER — Other Ambulatory Visit: Payer: Self-pay | Admitting: Family Medicine

## 2018-11-23 MED ORDER — FUROSEMIDE 40 MG PO TABS: 80.0000 mg | ORAL_TABLET | Freq: Two times a day (BID) | ORAL | 3 refills | Status: DC

## 2018-11-23 MED ORDER — LOSARTAN POTASSIUM 100 MG PO TABS: 100.0000 mg | ORAL_TABLET | Freq: Every day | ORAL | 3 refills | Status: DC

## 2018-11-23 MED ORDER — METOPROLOL TARTRATE 50 MG PO TABS: 50.0000 mg | ORAL_TABLET | Freq: Two times a day (BID) | ORAL | 3 refills | Status: DC

## 2018-11-23 MED ORDER — SITAGLIPTIN PHOSPHATE 100 MG PO TABS: 100.0000 mg | ORAL_TABLET | Freq: Every day | ORAL | 2 refills | Status: AC

## 2018-11-23 NOTE — Telephone Encounter (Signed)
Patient is calling to say that the mail order is going to take to long, needs meds tonight, so mail or needs to be cancelled  And the furosemide metoprolol januvia and losartan needs to be sent to [facility] in [place] instead if possible  If any questions please call debbie at [phone]

## 2018-11-23 NOTE — Telephone Encounter (Signed)
Meds sent to pharm

## 2018-11-24 ENCOUNTER — Encounter: Payer: Self-pay | Admitting: *Deleted

## 2018-11-24 DIAGNOSIS — H25013 Cortical age-related cataract, bilateral: Secondary | ICD-10-CM | POA: Diagnosis not present

## 2018-11-24 DIAGNOSIS — H2513 Age-related nuclear cataract, bilateral: Secondary | ICD-10-CM | POA: Diagnosis not present

## 2018-11-24 DIAGNOSIS — E119 Type 2 diabetes mellitus without complications: Secondary | ICD-10-CM | POA: Diagnosis not present

## 2018-11-24 DIAGNOSIS — H35033 Hypertensive retinopathy, bilateral: Secondary | ICD-10-CM | POA: Diagnosis not present

## 2018-11-24 LAB — HM DIABETES EYE EXAM

## 2018-11-29 ENCOUNTER — Other Ambulatory Visit: Payer: Self-pay | Admitting: Pharmacist

## 2018-11-29 ENCOUNTER — Other Ambulatory Visit: Payer: Self-pay | Admitting: Pharmacy Technician

## 2018-11-29 NOTE — Patient Outreach (Signed)
[facility]) Care Management  11/29/2018  William Alvarado May 12, 1951 [id number]    Successful call placed to patient and his wife William Alvarado regarding patient assistance application(s) for Trulicity and Januvia , wife did not feel comfortable providing me with patient date of birth, was able to have her confirm year that he was born. She states that patient assistance applications have been received and she stated that his insurance is paying for his medication and that she currently is not ready to fill out application due to information required. Informed her that we were applying for programs before patient runs into [facility] coverage. She again stated that she is not ready to fill them out.   Confirmed that she has my contact information in case she changes her mind. She also requests that calls come to her at [phone].  Follow up:  Will route note to [facility] RPh Vanice Sarah for case closure  William Alvarado. William Alvarado CPhT Certified Pharmacy Technician [facility] Care Management Direct Dial:[phone]

## 2018-11-29 NOTE — Patient Outreach (Addendum)
[facility]) Care Management [facility]  11/29/2018  William Alvarado 06-16-1951 [id number]  Reason for referral: HTA-CSNP/med assist  Patient's wife has chosen not to complete PAP applications at this time.  Encouraged her to do so, but they are not interested since medications are currently free.  Will not attempt patient assistance unless applications returned by patient/wife.  PLAN:  -I will continue quarterly telephonic outreach in HTA-CSNP patient   Kieth Brightly, PharmD, [facility] Clinical Pharmacist [facility] [employer]  [phone]

## 2018-12-02 ENCOUNTER — Encounter: Payer: Self-pay | Admitting: *Deleted

## 2018-12-05 ENCOUNTER — Encounter: Payer: Self-pay | Admitting: Family Medicine

## 2018-12-05 ENCOUNTER — Ambulatory Visit (INDEPENDENT_AMBULATORY_CARE_PROVIDER_SITE_OTHER): Payer: HMO | Admitting: Family Medicine

## 2018-12-05 ENCOUNTER — Other Ambulatory Visit: Payer: Self-pay

## 2018-12-05 VITALS — BP 132/78 | HR 88 | Temp 98.4°F | Resp 20 | Ht 73.0 in | Wt 309.0 lb

## 2018-12-05 DIAGNOSIS — L97101 Non-pressure chronic ulcer of unspecified thigh limited to breakdown of skin: Secondary | ICD-10-CM | POA: Diagnosis not present

## 2018-12-05 DIAGNOSIS — E118 Type 2 diabetes mellitus with unspecified complications: Secondary | ICD-10-CM | POA: Diagnosis not present

## 2018-12-05 DIAGNOSIS — I83001 Varicose veins of unspecified lower extremity with ulcer of thigh: Secondary | ICD-10-CM

## 2018-12-05 MED ORDER — DULAGLUTIDE 1.5 MG/0.5ML ~~LOC~~ SOAJ: SUBCUTANEOUS | 3 refills | Status: AC

## 2018-12-05 NOTE — Progress Notes (Signed)
Subjective:    Patient ID: William Alvarado, male    DOB: January 14, 1951, 68 y.o.   MRN: [id number]  Rash   Medication Refill  Associated symptoms include a rash.    12/21/17 Please see previous office visit.  Patient has gained 9 additional pounds since his last office visit.  He has +2 pitting edema in both legs to the knee.  Both legs have now become violaceous in color as well as the feet due to edema and poor circulation.  He reports increasing dyspnea on exertion and now has left basilar crackles.  We have been holding all diuretics due to his recent acute kidney injury due to dehydration from overuse of diuretics.  However it appears that he is now fluid overloaded on exam and require some diuresis.  At that time, my plan was:  I had originally documented incorrectly in his chart regarding low back pain.  This was unintentional and was related to another patient encounter not pertaining to this individual.  Therefore I have addended his chart and erased all mention of low back pain.  The patient did not fall.  He did not suffer an injury to his lower back.  He has no lower back pain.  I will cancel the x-ray that I ordered incorrectly.  I will also cancel the narcotic prescription that was sent to the pharmacy in [place].  This was incorrectly documented on the wrong patient  Patient is fluid overloaded on exam.  Resume Lasix 40 mg bid and recheck swelling and BMP in 1 week.  He had previously tolerated this dose without complications for more than 6 months.    12/31/17 Patient presents today with worsening leg swelling.  He has +2/10 pitting edema in both legs all the way to the knees.  This is despite taking Lasix 40 mg twice daily.  Both legs are red.  He reports pain in both legs due to the swelling.  There is weeping edema coming from the lateral aspect of his right shin. Wt Readings from Last 3 Encounters:  12/05/18 (!) 309 lb (140.2 kg)  04/26/18 (!) 307 lb 9.6 oz (139.5 kg)   03/21/18 (!) 322 lb (146.1 kg)   He denies any chest pain.  He denies any shortness of breath.  He denies any dyspnea on exertion however his leg swelling is worse.  And he is developing a venous stasis ulcer on the right lateral shin.  At that time, my plan was: Temporarily increase Lasix to 80 mg twice daily.  Supplement potassium with 20 mEq daily.  Patient has samples of these at home and can up the dose without any prescription.  Patient was placed in bilateral Unna boots for compression.  Recheck the patient first of next week to reassess kidney function and to monitor diuresis and leg swelling.  01/04/18 Patient's legs look remarkably better today.  Both Unna boots are removed.  Swelling has diminished greatly.  Weight is essentially unchanged however the redness swelling and pain distal to his knee has resolved.  He still has edema in his legs and he does not feel that the amount of swelling will allow him to resume wearing his compression hose.  He would like to replace the Unna boots 1 additional time before discontinuing them altogether.  At that time, my plan was: Patient was placed in Unna boots bilaterally both his right and left leg.  I have recommended that he continue to wear these for an additional 3 days  and then remove the Unna boot and switch over to his regular knee-high compression hose in an effort to prevent this moving forward.  The patient had not been wearing his compression hose prior to this because his legs were so swollen after his hospitalization he cannot put them on.  However the edema has improved after wearing the Unna boots and he believes that he is almost back to the point where he could resume wearing his compression hose.  Continue Lasix 80 mg twice daily but recheck BMP today to monitor kidney function closely.  We may need to make changes in his diuretic dose based on the results of his kidney function test.  03/21/18 Patient is here today for follow-up.  He states  that he gets winded easily.  He has very little exercise tolerance.  He reports shortness of breath with minimal exertion.  He is not exercising due to this.  He is not following his diet.  His weight remains relatively stable.  He has lost 4 pounds since his last visit.  He has +1 pitting edema in both legs along with chronic venous stasis changes in the skin overlying both legs including erythema and dermal fibrosis.  On pulmonary exam today, he has faint left basilar crackles otherwise his lungs are clear to auscultation.  He is not smoking.  He denies any orthopnea.  He has a large abdomen due to obesity.  This is also clearly affecting his breathing and his exercise tolerance.  He denies any hypoglycemia.  He denies any polyuria, polydipsia, or blurry vision.  He continues to be maintained on Lasix 80 mg twice daily.  His last renal function was improved and is back to his baseline.  He is here today for follow-up of his diabetes and to monitor his renal function.  His biggest concern in addition to his shortness of breath and easy fatigability is diffuse itching all over his skin.  This is been present ever since he worked underneath his house spreading wrap poison.  There is no visible rash but he is itching on his abdomen, both shoulders, and on his triceps.  At that time, my plan was: Patient does not appear to be fluid overloaded on exam however I will check a BNP to assure this.  I believe that he is euvolemic and that we should not make any changes in his dose of Lasix.  I will monitor his renal function given his history of chronic kidney disease and acute renal failure.  I will also recheck a hemoglobin A1c to ensure management of his diabetes.  Goal hemoglobin A1c for this patient is less than 7.  Blood pressure is acceptable.  We need to focus on weight loss to improve his breathing and his dyspnea on exertion.  We discussed gastric bypass today.  I do not feel that any further weight loss  medications are safe option for this patient given his cardiovascular history.  Furthermore given his chronic kidney disease, I do not believe that he would be a good candidate for an SGLT2 medication.  I did give the patient Elocon ointment to apply to the skin once daily for the next week for what I perceived to be a mild contact dermatitis  12/05/18 Patient is here today with a wound on the surface of his right leg.  He has a history of chronic venous insufficiency and venous stasis ulcers along with swelling in both legs coupled with underlying history of congestive heart failure.  Patient  appears euvolemic today despite his weight.  There is minimal pitting edema on his exam.  He denies any shortness of breath or dyspnea on exertion.  However he has two large venous stasis ulcers forming on the anterior surface of his right leg.  One is 7 cm in diameter.  The inferior is 5 cm in diameter.  The inferior blister still has the surface and is a deflated bulla.  The top has ruptured and is a open wound  Past Medical History:  Diagnosis Date  . Acute respiratory failure with hypoxia (HCC)    Hospitalized with acute respiratory failure secondary to CAP, diastolic CHF, and pulmonary embolism  . AKI (acute kidney injury) (HCC)   . Allergy    allergic rhinitis  . Altered mental status   . Atrial fibrillation (HCC)   . Chronic anticoagulation 03/26/2017   Failed Coumadin (PE with therapeutic INR). Now on Xarelto  . Colon polyps   . Community acquired pneumonia of right lower lobe of lung (HCC)   . COPD (chronic obstructive pulmonary disease) (HCC)   . Degenerative disc disease, lumbar   . Diabetes mellitus without complication (HCC)   . History of pulmonary embolus (PE) 03/26/2017  . Hypertension   . Mitral regurgitation 12/26/2012  . Obesity (BMI 30-39.9) 12/09/2012  . PAF (paroxysmal atrial fibrillation) (HCC)    DCCV in 2014 with recurrent PAF in setting of respiratory failure June 2018. S/P DCCV  04/13/17 to NSR  . Persistent atrial fibrillation   . Pneumonia ~ 2016  . Sepsis (HCC) 03/10/2017  . Sleep apnea   . Systolic murmur   . Type II diabetes mellitus (HCC)    Past Surgical History:  Procedure Laterality Date  . CARDIOVERSION N/A 01/09/2013   Procedure: CARDIOVERSION;  Surgeon: Pricilla Riffle, MD;  Location[facility] ENDOSCOPY;  Service: Cardiovascular;  Laterality: N/A;  . CARDIOVERSION N/A 04/13/2017   Procedure: CARDIOVERSION;  Surgeon: Quintella Reichert, MD;  Location: [facility] ENDOSCOPY;  Service: Cardiovascular;  Laterality: N/A;  . TEE WITHOUT CARDIOVERSION N/A 01/09/2013   Procedure: TRANSESOPHAGEAL ECHOCARDIOGRAM (TEE);  Surgeon: Pricilla Riffle, MD;  Location: [facility] ENDOSCOPY;  Service: Cardiovascular;  Laterality: N/A;   Current Outpatient Medications on File Prior to Visit  Medication Sig Dispense Refill  . Dulaglutide (TRULICITY) 1.5 MG/0.5ML SOPN INJECT 1.5 MG INTO THE SKIN ONCE A WEEK 4 pen 3  . fish oil-omega-3 fatty acids 1000 MG capsule Take 1 g by mouth daily.    . furosemide (LASIX) 40 MG tablet Take 2 tablets (80 mg total) by mouth 2 (two) times daily. 360 tablet 3  . metoprolol tartrate (LOPRESSOR) 50 MG tablet Take 1 tablet (50 mg total) by mouth 2 (two) times daily. 180 tablet 3  . potassium chloride SA (K-DUR,KLOR-CON) 20 MEQ tablet Take 1 tablet (20 mEq total) by mouth daily. 90 tablet 3  . rivaroxaban (XARELTO) 20 MG TABS tablet TAKE 1 TABLET BY MOUTH ONCE DAILY WITH SUPPER 90 tablet 3  . sitaGLIPtin (JANUVIA) 100 MG tablet Take 1 tablet (100 mg total) by mouth daily. 90 tablet 2   No current facility-administered medications on file prior to visit.    No Known Allergies Social History   Socioeconomic History  . Marital status: Married    Spouse name: Not on file  . Number of children: 2  . Years of education: Not on file  . Highest education level: Not on file  Occupational History    Comment: Machinist  Social Needs  .  Financial resource strain: Not on file  .  Food insecurity:    Worry: Never true    Inability: Never true  . Transportation needs:    Medical: No    Non-medical: No  Tobacco Use  . Smoking status: Former Smoker    Packs/day: 1.00    Years: 43.00    Pack years: 43.00    Types: Cigarettes    Last attempt to quit: 03/08/2017    Years since quitting: 1.7  . Smokeless tobacco: Never Used  Substance and Sexual Activity  . Alcohol use: No    Comment: Rare  . Drug use: No  . Sexual activity: Not Currently  Lifestyle  . Physical activity:    Days per week: Not on file    Minutes per session: Not on file  . Stress: Not on file  Relationships  . Social connections:    Talks on phone: Not on file    Gets together: Not on file    Attends religious service: Not on file    Active member of club or organization: Not on file    Attends meetings of clubs or organizations: Not on file    Relationship status: Not on file  . Intimate partner violence:    Fear of current or ex partner: Not on file    Emotionally abused: Not on file    Physically abused: Not on file    Forced sexual activity: Not on file  Other Topics Concern  . Not on file  Social History Narrative  . Not on file      Review of Systems  Skin: Positive for rash.  All other systems reviewed and are negative.      Objective:   Physical Exam Vitals signs reviewed.  Constitutional:      Appearance: He is well-developed.  Neck:     Musculoskeletal: Neck supple.     Vascular: No JVD.  Cardiovascular:     Rate and Rhythm: Normal rate. Rhythm irregularly irregular.     Heart sounds: Normal heart sounds. No murmur.  Pulmonary:     Effort: Pulmonary effort is normal. No respiratory distress.     Breath sounds: No wheezing.  Abdominal:     General: Bowel sounds are normal. There is no distension.     Palpations: Abdomen is soft.     Tenderness: There is no abdominal tenderness. There is no rebound.  Musculoskeletal:     Right lower leg: He exhibits  swelling and deformity. Edema present.       Legs:           Assessment & Plan:  Controlled diabetes mellitus type 2 with complications, unspecified whether long term insulin use (HCC) - Plan: Hemoglobin A1c, CBC with Differential/Platelet, COMPLETE METABOLIC PANEL WITH GFR, Lipid panel, Microalbumin, urine Venous stasis ulcer  Patient appears euvolemic today.  I will not increase his diuretic.  However the patient was placed in Unna boot on his right leg.  Silvadene was applied and nonadherent gauze and covered both of the large bullae on the anterior surface of his right leg.  The patient was then placed in an [employer].  I will recheck the patient on Thursday and continue dressing changes every 3 days until healed.  While the patient is here, we will get lab work to monitor his diabetes including a CBC, CMP, fasting lipid panel, hemoglobin A1c, and urine microalbumin

## 2018-12-05 NOTE — Addendum Note (Signed)
Addended by: Legrand Rams B on: 12/05/2018 08:45 AM   Modules accepted: Orders

## 2018-12-06 LAB — CBC WITH DIFFERENTIAL/PLATELET
ABSOLUTE MONOCYTES: 1020 {cells}/uL — AB (ref 200–950)
Basophils Absolute: 70 cells/uL (ref 0–200)
Basophils Relative: 0.7 %
Eosinophils Absolute: 200 cells/uL (ref 15–500)
Eosinophils Relative: 2 %
HCT: 48.7 % (ref 38.5–50.0)
Hemoglobin: 15.4 g/dL (ref 13.2–17.1)
LYMPHS ABS: 1570 {cells}/uL (ref 850–3900)
MCH: 28.1 pg (ref 27.0–33.0)
MCHC: 31.6 g/dL — ABNORMAL LOW (ref 32.0–36.0)
MCV: 88.9 fL (ref 80.0–100.0)
MPV: 10.8 fL (ref 7.5–12.5)
Monocytes Relative: 10.2 %
Neutro Abs: 7140 cells/uL (ref 1500–7800)
Neutrophils Relative %: 71.4 %
Platelets: 296 10*3/uL (ref 140–400)
RBC: 5.48 10*6/uL (ref 4.20–5.80)
RDW: 14.5 % (ref 11.0–15.0)
Total Lymphocyte: 15.7 %
WBC: 10 10*3/uL (ref 3.8–10.8)

## 2018-12-06 LAB — COMPLETE METABOLIC PANEL WITH GFR
AG RATIO: 1.1 (calc) (ref 1.0–2.5)
ALT: 14 U/L (ref 9–46)
AST: 17 U/L (ref 10–35)
Albumin: 4 g/dL (ref 3.6–5.1)
Alkaline phosphatase (APISO): 74 U/L (ref 35–144)
BUN/Creatinine Ratio: 19 (calc) (ref 6–22)
BUN: 29 mg/dL — ABNORMAL HIGH (ref 7–25)
CO2: 32 mmol/L (ref 20–32)
Calcium: 9.3 mg/dL (ref 8.6–10.3)
Chloride: 93 mmol/L — ABNORMAL LOW (ref 98–110)
Creat: 1.55 mg/dL — ABNORMAL HIGH (ref 0.70–1.25)
GFR, EST AFRICAN AMERICAN: 54 mL/min/{1.73_m2} — AB (ref >=60)
GFR, Est Non African American: 46 mL/min/{1.73_m2} — ABNORMAL LOW (ref >=60)
Globulin: 3.5 g/dL (calc) (ref 1.9–3.7)
Glucose, Bld: 210 mg/dL — ABNORMAL HIGH (ref 65–99)
Potassium: 4.3 mmol/L (ref 3.5–5.3)
Sodium: 135 mmol/L (ref 135–146)
Total Bilirubin: 0.7 mg/dL (ref 0.2–1.2)
Total Protein: 7.5 g/dL (ref 6.1–8.1)

## 2018-12-06 LAB — LIPID PANEL
Cholesterol: 132 mg/dL (ref <200)
HDL: 34 mg/dL — ABNORMAL LOW (ref >=40)
LDL Cholesterol (Calc): 80 mg/dL (calc)
Non-HDL Cholesterol (Calc): 98 mg/dL (calc) (ref <130)
Total CHOL/HDL Ratio: 3.9 (calc) (ref <5.0)
Triglycerides: 98 mg/dL (ref <150)

## 2018-12-06 LAB — HEMOGLOBIN A1C
Hgb A1c MFr Bld: 8.5 %{Hb} — ABNORMAL HIGH
Mean Plasma Glucose: 197 (calc)
eAG (mmol/L): 10.9 (calc)

## 2018-12-06 LAB — MICROALBUMIN, URINE: Microalb, Ur: 36.3 mg/dL

## 2018-12-08 ENCOUNTER — Encounter: Payer: Self-pay | Admitting: Family Medicine

## 2018-12-08 ENCOUNTER — Other Ambulatory Visit: Payer: Self-pay

## 2018-12-08 ENCOUNTER — Ambulatory Visit (INDEPENDENT_AMBULATORY_CARE_PROVIDER_SITE_OTHER): Payer: HMO | Admitting: Family Medicine

## 2018-12-08 VITALS — BP 130/74 | HR 90 | Temp 97.9°F | Resp 20 | Ht 73.0 in | Wt 308.0 lb

## 2018-12-08 DIAGNOSIS — I83001 Varicose veins of unspecified lower extremity with ulcer of thigh: Secondary | ICD-10-CM | POA: Diagnosis not present

## 2018-12-08 DIAGNOSIS — L97101 Non-pressure chronic ulcer of unspecified thigh limited to breakdown of skin: Secondary | ICD-10-CM | POA: Diagnosis not present

## 2018-12-08 NOTE — Progress Notes (Signed)
Subjective:    Patient ID: William Alvarado, male    DOB: 01-16-1951, 68 y.o.   MRN: [id number]  Medication Refill  Associated symptoms include a rash.  Rash     12/21/17 Please see previous office visit.  Patient has gained 9 additional pounds since his last office visit.  He has +2 pitting edema in both legs to the knee.  Both legs have now become violaceous in color as well as the feet due to edema and poor circulation.  He reports increasing dyspnea on exertion and now has left basilar crackles.  We have been holding all diuretics due to his recent acute kidney injury due to dehydration from overuse of diuretics.  However it appears that he is now fluid overloaded on exam and require some diuresis.  At that time, my plan was:  I had originally documented incorrectly in his chart regarding low back pain.  This was unintentional and was related to another patient encounter not pertaining to this individual.  Therefore I have addended his chart and erased all mention of low back pain.  The patient did not fall.  He did not suffer an injury to his lower back.  He has no lower back pain.  I will cancel the x-ray that I ordered incorrectly.  I will also cancel the narcotic prescription that was sent to the pharmacy in [place].  This was incorrectly documented on the wrong patient  Patient is fluid overloaded on exam.  Resume Lasix 40 mg bid and recheck swelling and BMP in 1 week.  He had previously tolerated this dose without complications for more than 6 months.    12/31/17 Patient presents today with worsening leg swelling.  He has +2/10 pitting edema in both legs all the way to the knees.  This is despite taking Lasix 40 mg twice daily.  Both legs are red.  He reports pain in both legs due to the swelling.  There is weeping edema coming from the lateral aspect of his right shin. Wt Readings from Last 3 Encounters:  12/08/18 (!) 308 lb (139.7 kg)  12/05/18 (!) 309 lb (140.2 kg)  04/26/18 (!)  307 lb 9.6 oz (139.5 kg)   He denies any chest pain.  He denies any shortness of breath.  He denies any dyspnea on exertion however his leg swelling is worse.  And he is developing a venous stasis ulcer on the right lateral shin.  At that time, my plan was: Temporarily increase Lasix to 80 mg twice daily.  Supplement potassium with 20 mEq daily.  Patient has samples of these at home and can up the dose without any prescription.  Patient was placed in bilateral Unna boots for compression.  Recheck the patient first of next week to reassess kidney function and to monitor diuresis and leg swelling.  01/04/18 Patient's legs look remarkably better today.  Both Unna boots are removed.  Swelling has diminished greatly.  Weight is essentially unchanged however the redness swelling and pain distal to his knee has resolved.  He still has edema in his legs and he does not feel that the amount of swelling will allow him to resume wearing his compression hose.  He would like to replace the Unna boots 1 additional time before discontinuing them altogether.  At that time, my plan was: Patient was placed in Unna boots bilaterally both his right and left leg.  I have recommended that he continue to wear these for an additional 3 days  and then remove the Unna boot and switch over to his regular knee-high compression hose in an effort to prevent this moving forward.  The patient had not been wearing his compression hose prior to this because his legs were so swollen after his hospitalization he cannot put them on.  However the edema has improved after wearing the Unna boots and he believes that he is almost back to the point where he could resume wearing his compression hose.  Continue Lasix 80 mg twice daily but recheck BMP today to monitor kidney function closely.  We may need to make changes in his diuretic dose based on the results of his kidney function test.  03/21/18 Patient is here today for follow-up.  He states that  he gets winded easily.  He has very little exercise tolerance.  He reports shortness of breath with minimal exertion.  He is not exercising due to this.  He is not following his diet.  His weight remains relatively stable.  He has lost 4 pounds since his last visit.  He has +1 pitting edema in both legs along with chronic venous stasis changes in the skin overlying both legs including erythema and dermal fibrosis.  On pulmonary exam today, he has faint left basilar crackles otherwise his lungs are clear to auscultation.  He is not smoking.  He denies any orthopnea.  He has a large abdomen due to obesity.  This is also clearly affecting his breathing and his exercise tolerance.  He denies any hypoglycemia.  He denies any polyuria, polydipsia, or blurry vision.  He continues to be maintained on Lasix 80 mg twice daily.  His last renal function was improved and is back to his baseline.  He is here today for follow-up of his diabetes and to monitor his renal function.  His biggest concern in addition to his shortness of breath and easy fatigability is diffuse itching all over his skin.  This is been present ever since he worked underneath his house spreading wrap poison.  There is no visible rash but he is itching on his abdomen, both shoulders, and on his triceps.  At that time, my plan was: Patient does not appear to be fluid overloaded on exam however I will check a BNP to assure this.  I believe that he is euvolemic and that we should not make any changes in his dose of Lasix.  I will monitor his renal function given his history of chronic kidney disease and acute renal failure.  I will also recheck a hemoglobin A1c to ensure management of his diabetes.  Goal hemoglobin A1c for this patient is less than 7.  Blood pressure is acceptable.  We need to focus on weight loss to improve his breathing and his dyspnea on exertion.  We discussed gastric bypass today.  I do not feel that any further weight loss medications  are safe option for this patient given his cardiovascular history.  Furthermore given his chronic kidney disease, I do not believe that he would be a good candidate for an SGLT2 medication.  I did give the patient Elocon ointment to apply to the skin once daily for the next week for what I perceived to be a mild contact dermatitis  12/05/18 Patient is here today with a wound on the surface of his right leg.  He has a history of chronic venous insufficiency and venous stasis ulcers along with swelling in both legs coupled with underlying history of congestive heart failure.  Patient  appears euvolemic today despite his weight.  There is minimal pitting edema on his exam.  He denies any shortness of breath or dyspnea on exertion.  However he has two large venous stasis ulcers forming on the anterior surface of his right leg.  One is 7 cm in diameter.  The inferior is 5 cm in diameter.  The inferior blister still has the surface and is a deflated bulla.  The top has ruptured and is a open wound.  At that time, my plan was:  Patient appears euvolemic today.  I will not increase his diuretic.  However the patient was placed in Unna boot on his right leg.  Silvadene was applied and nonadherent gauze and covered both of the large bullae on the anterior surface of his right leg.  The patient was then placed in an [employer].  I will recheck the patient on Thursday and continue dressing changes every 3 days until healed.  While the patient is here, we will get lab work to monitor his diabetes including a CBC, CMP, fasting lipid panel, hemoglobin A1c, and urine microalbumin  12/08/18 The superior lesion has improved.  It is now only about 4 cm.  The bulla has ruptured and there is now healthy granulation tissue exposed to the air.  It is bleeding slightly after I remove the Unna boot.  There is no evidence of cellulitis.  Inferior bullae is just as large as it was before and perhaps is now up to 6 cm.  It is yet to  rupture.  It is still weeping edema.  There is no evidence of secondary cellulitis.  The pitting edema around it is minimal.  There are chronic venous insufficiency changes to the skin on his legs distal to the knee.  His right foot is a violaceous pink in color.  This is equal and symmetric bilaterally.  He has delayed capillary refill in both feet.  ABIs were obtained of both legs in 2018 and were normal.  Therefore I believe this is skin changes related to chronic venous insufficiency and pitting edema  Past Medical History:  Diagnosis Date   Acute respiratory failure with hypoxia (HCC)    Hospitalized with acute respiratory failure secondary to CAP, diastolic CHF, and pulmonary embolism   AKI (acute kidney injury) (HCC)    Allergy    allergic rhinitis   Altered mental status    Atrial fibrillation (HCC)    Chronic anticoagulation 03/26/2017   Failed Coumadin (PE with therapeutic INR). Now on Xarelto   Colon polyps    Community acquired pneumonia of right lower lobe of lung (HCC)    COPD (chronic obstructive pulmonary disease) (HCC)    Degenerative disc disease, lumbar    Diabetes mellitus without complication (HCC)    History of pulmonary embolus (PE) 03/26/2017   Hypertension    Mitral regurgitation 12/26/2012   Obesity (BMI 30-39.9) 12/09/2012   PAF (paroxysmal atrial fibrillation) (HCC)    DCCV in 2014 with recurrent PAF in setting of respiratory failure June 2018. S/P DCCV 04/13/17 to NSR   Persistent atrial fibrillation    Pneumonia ~ 2016   Sepsis (HCC) 03/10/2017   Sleep apnea    Systolic murmur    Type II diabetes mellitus (HCC)    Past Surgical History:  Procedure Laterality Date   CARDIOVERSION N/A 01/09/2013   Procedure: CARDIOVERSION;  Surgeon: Pricilla Riffle, MD;  Location: [facility] ENDOSCOPY;  Service: Cardiovascular;  Laterality: N/A;   CARDIOVERSION N/A 04/13/2017  Procedure: CARDIOVERSION;  Surgeon: Quintella Reichert, MD;  Location: [facility] ENDOSCOPY;  Service:  Cardiovascular;  Laterality: N/A;   TEE WITHOUT CARDIOVERSION N/A 01/09/2013   Procedure: TRANSESOPHAGEAL ECHOCARDIOGRAM (TEE);  Surgeon: Pricilla Riffle, MD;  Location: [facility] ENDOSCOPY;  Service: Cardiovascular;  Laterality: N/A;   Current Outpatient Medications on File Prior to Visit  Medication Sig Dispense Refill   fish oil-omega-3 fatty acids 1000 MG capsule Take 1 g by mouth daily.     furosemide (LASIX) 40 MG tablet Take 2 tablets (80 mg total) by mouth 2 (two) times daily. 360 tablet 3   metoprolol tartrate (LOPRESSOR) 50 MG tablet Take 1 tablet (50 mg total) by mouth 2 (two) times daily. 180 tablet 3   potassium chloride SA (K-DUR,KLOR-CON) 20 MEQ tablet Take 1 tablet (20 mEq total) by mouth daily. 90 tablet 3   rivaroxaban (XARELTO) 20 MG TABS tablet TAKE 1 TABLET BY MOUTH ONCE DAILY WITH SUPPER 90 tablet 3   Dulaglutide (TRULICITY) 1.5 MG/0.5ML SOPN INJECT 1.5 MG INTO THE SKIN ONCE A WEEK (Patient not taking: Reported on 12/08/2018) 4 pen 3   sitaGLIPtin (JANUVIA) 100 MG tablet Take 1 tablet (100 mg total) by mouth daily. (Patient not taking: Reported on 12/08/2018) 90 tablet 2   No current facility-administered medications on file prior to visit.    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: Not on file   Food insecurity:    Worry: Never true    Inability: Never true   Transportation needs:    Medical: No    Non-medical: No  Tobacco Use   Smoking status: Former Smoker    Packs/day: 1.00    Years: 43.00    Pack years: 43.00    Types: Cigarettes    Last attempt to quit: 03/08/2017    Years since quitting: 1.7   Smokeless tobacco: Never Used  Substance and Sexual Activity   Alcohol use: No    Comment: Rare    Drug use: No   Sexual activity: Not Currently  Lifestyle   Physical activity:    Days per week: Not on file    Minutes per session: Not on file   Stress: Not on file  Relationships   Social connections:    Talks on phone: Not on file    Gets together: Not on file    Attends religious service: Not on file    Active member of club or organization: Not on file    Attends meetings of clubs or organizations: Not on file    Relationship status: Not on file   Intimate partner violence:    Fear of current or ex partner: Not on file    Emotionally abused: Not on file    Physically abused: Not on file    Forced sexual activity: Not on file  Other Topics Concern   Not on file  Social History Narrative   Not on file      Review of Systems  Skin: Positive for rash.  All other systems reviewed and are negative.      Objective:   Physical Exam Vitals signs reviewed.  Constitutional:      Appearance: He is well-developed.  Neck:     Musculoskeletal: Neck supple.     Vascular: No JVD.  Cardiovascular:     Rate and Rhythm: Normal rate. Rhythm irregularly irregular.     Heart sounds: Normal heart sounds. No murmur.  Pulmonary:     Effort: Pulmonary effort is normal. No respiratory distress.     Breath sounds: No wheezing.  Abdominal:     General: Bowel sounds are normal. There is no distension.     Palpations: Abdomen is soft.     Tenderness: There is no abdominal tenderness. There is no rebound.  Musculoskeletal:     Right lower leg: He exhibits swelling and deformity. Edema present.       Legs:           Assessment & Plan:  Venous stasis ulcer of thigh limited to breakdown of skin with varicose veins, unspecified laterality (HCC)  Patient is replaced in the [employer].  Lesions were covered with Silvadene, nonadherent gauze, and then covered with calamine impregnated gauze and wrapped in an [employer].  Recheck on Monday to recheck change the dressing.

## 2018-12-12 ENCOUNTER — Ambulatory Visit (INDEPENDENT_AMBULATORY_CARE_PROVIDER_SITE_OTHER): Payer: HMO | Admitting: Family Medicine

## 2018-12-12 ENCOUNTER — Other Ambulatory Visit: Payer: Self-pay

## 2018-12-12 VITALS — BP 120/76 | HR 96 | Temp 98.1°F | Resp 20 | Ht 73.0 in | Wt 311.0 lb

## 2018-12-12 DIAGNOSIS — Z1331 Encounter for screening for depression: Secondary | ICD-10-CM | POA: Diagnosis not present

## 2018-12-12 DIAGNOSIS — I83001 Varicose veins of unspecified lower extremity with ulcer of thigh: Secondary | ICD-10-CM

## 2018-12-12 DIAGNOSIS — L97811 Non-pressure chronic ulcer of other part of right lower leg limited to breakdown of skin: Secondary | ICD-10-CM

## 2018-12-12 DIAGNOSIS — L97101 Non-pressure chronic ulcer of unspecified thigh limited to breakdown of skin: Secondary | ICD-10-CM

## 2018-12-12 NOTE — Progress Notes (Signed)
Subjective:    Patient ID: William Alvarado, male    DOB: 1951-06-27, 68 y.o.   MRN: [id number]  Medication Refill  Associated symptoms include a rash.  Rash     12/21/17 Please see previous office visit.  Patient has gained 9 additional pounds since his last office visit.  He has +2 pitting edema in both legs to the knee.  Both legs have now become violaceous in color as well as the feet due to edema and poor circulation.  He reports increasing dyspnea on exertion and now has left basilar crackles.  We have been holding all diuretics due to his recent acute kidney injury due to dehydration from overuse of diuretics.  However it appears that he is now fluid overloaded on exam and require some diuresis.  At that time, my plan was:  I had originally documented incorrectly in his chart regarding low back pain.  This was unintentional and was related to another patient encounter not pertaining to this individual.  Therefore I have addended his chart and erased all mention of low back pain.  The patient did not fall.  He did not suffer an injury to his lower back.  He has no lower back pain.  I will cancel the x-ray that I ordered incorrectly.  I will also cancel the narcotic prescription that was sent to the pharmacy in [place].  This was incorrectly documented on the wrong patient  Patient is fluid overloaded on exam.  Resume Lasix 40 mg bid and recheck swelling and BMP in 1 week.  He had previously tolerated this dose without complications for more than 6 months.    12/31/17 Patient presents today with worsening leg swelling.  He has +2/10 pitting edema in both legs all the way to the knees.  This is despite taking Lasix 40 mg twice daily.  Both legs are red.  He reports pain in both legs due to the swelling.  There is weeping edema coming from the lateral aspect of his right shin. Wt Readings from Last 3 Encounters:  12/08/18 (!) 308 lb (139.7 kg)  12/05/18 (!) 309 lb (140.2 kg)  04/26/18 (!)  307 lb 9.6 oz (139.5 kg)   He denies any chest pain.  He denies any shortness of breath.  He denies any dyspnea on exertion however his leg swelling is worse.  And he is developing a venous stasis ulcer on the right lateral shin.  At that time, my plan was: Temporarily increase Lasix to 80 mg twice daily.  Supplement potassium with 20 mEq daily.  Patient has samples of these at home and can up the dose without any prescription.  Patient was placed in bilateral Unna boots for compression.  Recheck the patient first of next week to reassess kidney function and to monitor diuresis and leg swelling.  01/04/18 Patient's legs look remarkably better today.  Both Unna boots are removed.  Swelling has diminished greatly.  Weight is essentially unchanged however the redness swelling and pain distal to his knee has resolved.  He still has edema in his legs and he does not feel that the amount of swelling will allow him to resume wearing his compression hose.  He would like to replace the Unna boots 1 additional time before discontinuing them altogether.  At that time, my plan was: Patient was placed in Unna boots bilaterally both his right and left leg.  I have recommended that he continue to wear these for an additional 3 days  and then remove the Unna boot and switch over to his regular knee-high compression hose in an effort to prevent this moving forward.  The patient had not been wearing his compression hose prior to this because his legs were so swollen after his hospitalization he cannot put them on.  However the edema has improved after wearing the Unna boots and he believes that he is almost back to the point where he could resume wearing his compression hose.  Continue Lasix 80 mg twice daily but recheck BMP today to monitor kidney function closely.  We may need to make changes in his diuretic dose based on the results of his kidney function test.  03/21/18 Patient is here today for follow-up.  He states that  he gets winded easily.  He has very little exercise tolerance.  He reports shortness of breath with minimal exertion.  He is not exercising due to this.  He is not following his diet.  His weight remains relatively stable.  He has lost 4 pounds since his last visit.  He has +1 pitting edema in both legs along with chronic venous stasis changes in the skin overlying both legs including erythema and dermal fibrosis.  On pulmonary exam today, he has faint left basilar crackles otherwise his lungs are clear to auscultation.  He is not smoking.  He denies any orthopnea.  He has a large abdomen due to obesity.  This is also clearly affecting his breathing and his exercise tolerance.  He denies any hypoglycemia.  He denies any polyuria, polydipsia, or blurry vision.  He continues to be maintained on Lasix 80 mg twice daily.  His last renal function was improved and is back to his baseline.  He is here today for follow-up of his diabetes and to monitor his renal function.  His biggest concern in addition to his shortness of breath and easy fatigability is diffuse itching all over his skin.  This is been present ever since he worked underneath his house spreading wrap poison.  There is no visible rash but he is itching on his abdomen, both shoulders, and on his triceps.  At that time, my plan was: Patient does not appear to be fluid overloaded on exam however I will check a BNP to assure this.  I believe that he is euvolemic and that we should not make any changes in his dose of Lasix.  I will monitor his renal function given his history of chronic kidney disease and acute renal failure.  I will also recheck a hemoglobin A1c to ensure management of his diabetes.  Goal hemoglobin A1c for this patient is less than 7.  Blood pressure is acceptable.  We need to focus on weight loss to improve his breathing and his dyspnea on exertion.  We discussed gastric bypass today.  I do not feel that any further weight loss medications  are safe option for this patient given his cardiovascular history.  Furthermore given his chronic kidney disease, I do not believe that he would be a good candidate for an SGLT2 medication.  I did give the patient Elocon ointment to apply to the skin once daily for the next week for what I perceived to be a mild contact dermatitis  12/05/18 Patient is here today with a wound on the surface of his right leg.  He has a history of chronic venous insufficiency and venous stasis ulcers along with swelling in both legs coupled with underlying history of congestive heart failure.  Patient  appears euvolemic today despite his weight.  There is minimal pitting edema on his exam.  He denies any shortness of breath or dyspnea on exertion.  However he has two large venous stasis ulcers forming on the anterior surface of his right leg.  One is 7 cm in diameter.  The inferior is 5 cm in diameter.  The inferior blister still has the surface and is a deflated bulla.  The top has ruptured and is a open wound.  At that time, my plan was:  Patient appears euvolemic today.  I will not increase his diuretic.  However the patient was placed in Unna boot on his right leg.  Silvadene was applied and nonadherent gauze and covered both of the large bullae on the anterior surface of his right leg.  The patient was then placed in an [employer].  I will recheck the patient on Thursday and continue dressing changes every 3 days until healed.  While the patient is here, we will get lab work to monitor his diabetes including a CBC, CMP, fasting lipid panel, hemoglobin A1c, and urine microalbumin  12/08/18 The superior lesion has improved.  It is now only about 4 cm.  The bulla has ruptured and there is now healthy granulation tissue exposed to the air.  It is bleeding slightly after I remove the Unna boot.  There is no evidence of cellulitis.  Inferior bullae is just as large as it was before and perhaps is now up to 6 cm.  It is yet to  rupture.  It is still weeping edema.  There is no evidence of secondary cellulitis.  The pitting edema around it is minimal.  There are chronic venous insufficiency changes to the skin on his legs distal to the knee.  His right foot is a violaceous pink in color.  This is equal and symmetric bilaterally.  He has delayed capillary refill in both feet.  ABIs were obtained of both legs in 2018 and were normal.  Therefore I believe this is skin changes related to chronic venous insufficiency and pitting edema.  AT that time, my plan was: Patient is replaced in the [employer].  Lesions were covered with Silvadene, nonadherent gauze, and then covered with calamine impregnated gauze and wrapped in an [employer].  Recheck on Monday to recheck change the dressing.   12/12/18  Although patient's weight is up slightly from his last visit, the swelling in his right leg has completely resolved.  After removing his Unna boot today, the superior wound has completely healed.  It has shrunk from 4 cm with granulation tissue now to essentially nonexistent and covered with skin.  The bottom bulla is now firm.  There is no separation of the skin layers as there was before.  The outline of the bulla can still be seen however the skin now is firm and appears to be turning into fibrous tissue as it is healing.  There is no desquamation of the bottom bullae.  It is still roughly 6 cm in diameter however there is no exposed wound.  It is not draining.  There is no evidence of cellulitis.  I went over the patient's depression screen with both he and his wife.  Several of the questions he answers were concerning for depression.  We talked at length today about clinical depression as well as the treatment options and the theory behind serotonin replacement.  However the patient states that he does not feel depressed.  Despite circling  that on the questionnaire he states that he is not depressed.  He denies anhedonia.  He denies any insomnia  or suicidal thoughts or lack of energy or lack of motivation.  He is adamant that he is not clinically depressed.  Therefore I left the issue open. Past Medical History:  Diagnosis Date  . Acute respiratory failure with hypoxia (HCC)    Hospitalized with acute respiratory failure secondary to CAP, diastolic CHF, and pulmonary embolism  . AKI (acute kidney injury) (HCC)   . Allergy    allergic rhinitis  . Altered mental status   . Atrial fibrillation (HCC)   . Chronic anticoagulation 03/26/2017   Failed Coumadin (PE with therapeutic INR). Now on Xarelto  . Colon polyps   . Community acquired pneumonia of right lower lobe of lung (HCC)   . COPD (chronic obstructive pulmonary disease) (HCC)   . Degenerative disc disease, lumbar   . Diabetes mellitus without complication (HCC)   . History of pulmonary embolus (PE) 03/26/2017  . Hypertension   . Mitral regurgitation 12/26/2012  . Obesity (BMI 30-39.9) 12/09/2012  . PAF (paroxysmal atrial fibrillation) (HCC)    DCCV in 2014 with recurrent PAF in setting of respiratory failure June 2018. S/P DCCV 04/13/17 to NSR  . Persistent atrial fibrillation   . Pneumonia ~ 2016  . Sepsis (HCC) 03/10/2017  . Sleep apnea   . Systolic murmur   . Type II diabetes mellitus (HCC)    Past Surgical History:  Procedure Laterality Date  . CARDIOVERSION N/A 01/09/2013   Procedure: CARDIOVERSION;  Surgeon: Pricilla Riffle, MD;  Location: [facility] ENDOSCOPY;  Service: Cardiovascular;  Laterality: N/A;  . CARDIOVERSION N/A 04/13/2017   Procedure: CARDIOVERSION;  Surgeon: Quintella Reichert, MD;  Location: [facility] ENDOSCOPY;  Service: Cardiovascular;  Laterality: N/A;  . TEE WITHOUT CARDIOVERSION N/A 01/09/2013   Procedure: TRANSESOPHAGEAL ECHOCARDIOGRAM (TEE);  Surgeon: Pricilla Riffle, MD;  Location: [facility] ENDOSCOPY;  Service: Cardiovascular;  Laterality: N/A;   Current Outpatient Medications on File Prior to Visit  Medication Sig Dispense Refill  . Dulaglutide (TRULICITY) 1.5 MG/0.5ML SOPN  INJECT 1.5 MG INTO THE SKIN ONCE A WEEK (Patient not taking: Reported on 12/08/2018) 4 pen 3  . fish oil-omega-3 fatty acids 1000 MG capsule Take 1 g by mouth daily.    . furosemide (LASIX) 40 MG tablet Take 2 tablets (80 mg total) by mouth 2 (two) times daily. 360 tablet 3  . metoprolol tartrate (LOPRESSOR) 50 MG tablet Take 1 tablet (50 mg total) by mouth 2 (two) times daily. 180 tablet 3  . potassium chloride SA (K-DUR,KLOR-CON) 20 MEQ tablet Take 1 tablet (20 mEq total) by mouth daily. 90 tablet 3  . rivaroxaban (XARELTO) 20 MG TABS tablet TAKE 1 TABLET BY MOUTH ONCE DAILY WITH SUPPER 90 tablet 3  . sitaGLIPtin (JANUVIA) 100 MG tablet Take 1 tablet (100 mg total) by mouth daily. (Patient not taking: Reported on 12/08/2018) 90 tablet 2   No current facility-administered medications on file prior to visit.    No Known Allergies Social History   Socioeconomic History  . Marital status: Married    Spouse name: Not on file  . Number of children: 2  . Years of education: Not on file  . Highest education level: Not on file  Occupational History    Comment: Machinist  Social Needs  . Financial resource strain: Not on file  . Food insecurity:    Worry: Never true    Inability: Never true  .  Transportation needs:    Medical: No    Non-medical: No  Tobacco Use  . Smoking status: Former Smoker    Packs/day: 1.00    Years: 43.00    Pack years: 43.00    Types: Cigarettes    Last attempt to quit: 03/08/2017    Years since quitting: 1.7  . Smokeless tobacco: Never Used  Substance and Sexual Activity  . Alcohol use: No    Comment: Rare  . Drug use: No  . Sexual activity: Not Currently  Lifestyle  . Physical activity:    Days per week: Not on file    Minutes per session: Not on file  . Stress: Not on file  Relationships  . Social connections:    Talks on phone: Not on file    Gets together: Not on file    Attends religious service: Not on file    Active member of club or  organization: Not on file    Attends meetings of clubs or organizations: Not on file    Relationship status: Not on file  . Intimate partner violence:    Fear of current or ex partner: Not on file    Emotionally abused: Not on file    Physically abused: Not on file    Forced sexual activity: Not on file  Other Topics Concern  . Not on file  Social History Narrative  . Not on file      Review of Systems  Skin: Positive for rash.  All other systems reviewed and are negative.      Objective:   Physical Exam Vitals signs reviewed.  Constitutional:      Appearance: He is well-developed.  Neck:     Musculoskeletal: Neck supple.     Vascular: No JVD.  Cardiovascular:     Rate and Rhythm: Normal rate. Rhythm irregularly irregular.     Heart sounds: Normal heart sounds. No murmur.  Pulmonary:     Effort: Pulmonary effort is normal. No respiratory distress.     Breath sounds: No wheezing.  Abdominal:     General: Bowel sounds are normal. There is no distension.     Palpations: Abdomen is soft.     Tenderness: There is no abdominal tenderness. There is no rebound.  Musculoskeletal:     Right lower leg: He exhibits deformity. He exhibits no swelling. No edema.       Legs:           Assessment & Plan:  Venous stasis ulcer of thigh limited to breakdown of skin with varicose veins, unspecified laterality (HCC)  Positive depression screening  Patient is replaced in an [employer] today.  Silvadene is applied to the wound.  It is then covered with nonadherent gauze.  His leg is then wrapped with calamine impregnated gauze and then covered with coban.  Gave the patient's wife dressing supplies to repeat the dressing change on Thursday.  I stated that I would be happy to do it if they do not feel comfortable however I am trying to avoid the patient having to come to the office during the current coronavirus outbreak.  Change dressings every 3 days until healed.  Reassess in a week  if not healing.  Spent more than 20 minutes today with the patient part of this was spent discussing his depression screen.  Despite a positive depression screen, the patient is adamant that he is not clinically depressed.  Therefore no medication will be started at this time.

## 2018-12-22 ENCOUNTER — Other Ambulatory Visit: Payer: Self-pay

## 2018-12-22 NOTE — Patient Outreach (Signed)
  [facility]) Care Management Chronic Special Needs Program  12/22/2018  Name: William Alvarado DOB: 10/11/1950  MRN: [id number]  [name] is enrolled in a chronic special needs plan for Diabetes. Client called for assessment call Clinet states wife is not at home and he prefers to have her help him answer questions Plan to call back next week when wife will be available  Chronic care management coordinator will attempt outreach in one week.   Dudley Major RN, Maximiano Coss, CDE Chronic Care Management Coordinator [facility] Care Management [phone]

## 2018-12-29 ENCOUNTER — Other Ambulatory Visit: Payer: Self-pay

## 2018-12-29 NOTE — Patient Outreach (Signed)
  [facility]) Care Management Chronic Special Needs Program    12/29/2018  Name: William Alvarado, DOB: Mar 12, 1951  MRN: [id number]   William Alvarado is enrolled in a chronic special needs plan for Diabetes  Client called for assessment call Clinet states wife is again not at home and he prefers to have her help him answer questions Client request to for [facility] to call back sometime next week.  Left number for wife to call RNCM to call when she is available  Plan to call back next week when wife will be available  Chronic care management coordinator will attempt 3rd outreach in one week.  Dudley Major RN, Maximiano Coss, CDE Chronic Care Management Coordinator [facility] Care Management [phone]

## 2019-01-06 ENCOUNTER — Other Ambulatory Visit: Payer: Self-pay

## 2019-01-06 NOTE — Patient Outreach (Signed)
  [facility]) Care Management Chronic Special Needs Program  01/06/2019  Name: William Alvarado DOB: Mar 18, 1951  MRN: [id number]  [name] is enrolled in a chronic special needs plan for Diabetes. Reviewed and updated care plan.  Subjective: Client states his wife is really busy and he does not wish for RNCM to call her at this time.  States he is doing good but he can not answer RNCM questions at this time.    Goals Addressed            This Visit's Progress   .  Acknowledge receipt of Advanced Directive package   On track   . Advanced Care Planning complete by next 9 months   On track   . Client understands the importance of follow-up with providers by attending scheduled visits   On track   . COMPLETED: Client will report abillity to obtain Medications within next 3 months      . Client will report no worsening of symptoms of Atrial Fibrillation within the next 9 months   On track   . Client will report no worsening of symptoms related to heart disease within the next 9 months    No change   . Client will use Assistive Devices as needed and verbalize understanding of device use   On track   . Client will verbalize knowledge of self management of Hypertension as evidences by BP reading of 140/90 or less; or as defined by provider   On track   . Client/Caregiver will verbalize understanding of instructions related to self-care and safety   On track   . HEMOGLOBIN A1C < 7.0       Continue  Diabetes self management actions:  Glucose monitoring per provider recommendations  Perform Quality checks on blood meter  Eat Healthy  Check feet daily  Visit provider every 3-6 months as directed  Hbg A1C level every 3-6 months.  Eye Exam yearly    . COMPLETED: Obtain annual  Lipid Profile, LDL-C       Completed 12/05/18    . COMPLETED: Obtain Annual Eye (retinal)  Exam        Completed 11/24/18    . Obtain Annual Foot Exam   On track   . COMPLETED:  Obtain annual screen for micro albuminuria (urine) , nephropathy (kidney problems)       Completed 12/05/18    . Obtain Hemoglobin A1C at least 2 times per year   On track   . Visit Primary Care Provider or Endocrinologist at least 2 times per year    On track    Client is not meeting diabetes self management goal of hemoglobin A1C of <7% with last reading of 8.5% Client has completed eye exam, lipid profile and microalbumin.   Pharmacy has completed medication review and provided forms for pharmacy assistance programs to complete but is choosing not complete at this time. Encouraged client to have wife call RNCM as needed Plan:  Send successful outreach letter with a copy of their individualized care plan and Send individual care plan to provider  Chronic care management coordinator will outreach in: 5- 6 Months             Pharmacy has closed case   Dudley Major RN, Maximiano Coss, CDE Chronic Care Management Coordinator [facility] Care Management [phone]

## 2019-01-31 ENCOUNTER — Ambulatory Visit: Payer: Self-pay | Admitting: Pharmacist

## 2019-02-15 ENCOUNTER — Ambulatory Visit: Payer: Self-pay | Admitting: Pharmacist

## 2019-02-15 ENCOUNTER — Other Ambulatory Visit: Payer: Self-pay | Admitting: Pharmacist

## 2019-02-15 NOTE — Patient Outreach (Signed)
[facility]) Care Management  [facility]  02/15/2019  BART BARMES 03-24-1951 [id number]   Reason for referral: Medication Management  Referral source: [employer] Care Manager with [facility] Current insurance: [employer]  Reason for call: medication management/adherence  Outreach:  Unsuccessful telephone call attempt #1 to patient.   HIPAA compliant voicemail left requesting a return call  Plan:  -I will make another outreach attempt to patient within 3-4 business days.    Kieth Brightly, PharmD, [facility] Clinical Pharmacist [facility]  [phone]

## 2019-02-16 ENCOUNTER — Other Ambulatory Visit: Payer: Self-pay

## 2019-02-16 NOTE — Patient Outreach (Signed)
  [facility]) Care Management Chronic Special Needs Program    02/16/2019  Name: [name], DOB: 01/20/1951  MRN: [id number]   [name] is enrolled in a chronic special needs plan for Diabetes. HIPAA verified and client forgetful but able to answer most questions.  Wife not at home Client called to follow up from call to the Nurse Advise Line yesterday concerning increased numbness in his legs and diabetes. States that he has been having more numbness in his feet and sometimes has pin prick feelings in his feet and legs.  States he is wearing his compression hose daily and he does not go barefoot.  States he does have a small area on his shin that he hit that is healing.    Client has questions about his diabetes and how it effects his numbness Client reports only checking CBGs every few weeks and can not remember his numbers Discussed his diet and reports he sometimes has Pop Tarts for breakfast.  Instructed to try to have foods with more protein for breakfast and to avoid the Pop Tarts. Discussed how elevated blood sugars can cause or increase his neuropathy.  Encouraged to check his CBG more frequently and to write down his results to show to his doctor. Reviewed foot care and daily foot checks Instructed client to call his provider to discuss his numbness and to check the open area on his shin Voiced understanding  Encouraged to call RNCM with any diabetes questions he might have and to also include his wife when possible  Plan to outreach on next scheduled call or sooner if needed Plan to send educational material on foot care and DM diet Plan to route note to Dr.Pickard  Dudley Major RN, Maximiano Coss, CDE Chronic Care Management Coordinator [facility] Care Management [phone]

## 2019-02-20 ENCOUNTER — Ambulatory Visit: Payer: Self-pay | Admitting: Pharmacist

## 2019-02-20 ENCOUNTER — Other Ambulatory Visit: Payer: Self-pay | Admitting: Pharmacist

## 2019-02-20 NOTE — Patient Outreach (Addendum)
[facility]) Care Management [facility]  02/20/2019  William Alvarado [birth year] [id number]  Reason for referral: HTA C-SNP DM outreach  Successful outreach call to William Alvarado with HIPAA identifiers verified x2.  Patient states he is doing well other than his neuropathy.  He states he will be calling his PCP to set up appointment as it is starting to affect his ADLs and overall disposition.  Reviewed some treatment options for neuropathy with patient so he will be able to discuss with PCP.  Encouraged patient to continue checking BGs as directed.  His wife assists with checking his BG every morning.  He states it was "good" this morning.  He forgets the numbers, however he does remember when it is off.  Denies hypoglycemia.  He states he is trying to eat better and avoid soft drinks and sugar.  Encouraged and reviewed health options for patient.  Patient states medications are affordable at this time.  PLAN:  I will follow up with patient in 3-6 months for HTA C-SNP outreach  Kieth Brightly, PharmD, [facility] Clinical Pharmacist [facility] [employer]  [phone]

## 2019-03-02 ENCOUNTER — Ambulatory Visit (INDEPENDENT_AMBULATORY_CARE_PROVIDER_SITE_OTHER): Payer: HMO | Admitting: Family Medicine

## 2019-03-02 ENCOUNTER — Other Ambulatory Visit: Payer: Self-pay

## 2019-03-02 ENCOUNTER — Encounter: Payer: Self-pay | Admitting: Family Medicine

## 2019-03-02 VITALS — BP 120/80 | HR 96 | Temp 98.4°F | Resp 17 | Ht 73.0 in | Wt 319.0 lb

## 2019-03-02 DIAGNOSIS — E0842 Diabetes mellitus due to underlying condition with diabetic polyneuropathy: Secondary | ICD-10-CM

## 2019-03-02 MED ORDER — GABAPENTIN 300 MG PO CAPS: 300.0000 mg | ORAL_CAPSULE | Freq: Three times a day (TID) | ORAL | 3 refills | Status: DC

## 2019-03-02 NOTE — Progress Notes (Signed)
Subjective:    Patient ID: William Alvarado, male    DOB: May 04, 1951, 68 y.o.   MRN: [id number]  HPI  Patient has a longstanding history of diabetes mellitus.  His last hemoglobin A1c was at the end of March and was 8.5.  Over the last several months he has developed progressive pain in both feet.  His feet feel numb constantly.  He feels like they are thick and swollen even when they are not.  He reports burning stinging pain on the plantar aspects of both feet similar to the pain 1 experiences when his feet fall asleep and start to wake up.  Whenever he walks the pain intensifies.  This is caused him to be more inactive and sedentary which is led to increasing weight gain.  He is here today questioning if there are medications he can use for diabetic nerve pain Past Medical History:  Diagnosis Date  . Acute respiratory failure with hypoxia (HCC)    Hospitalized with acute respiratory failure secondary to CAP, diastolic CHF, and pulmonary embolism  . AKI (acute kidney injury) ([place])   . Allergy    allergic rhinitis  . Altered mental status   . Atrial fibrillation ([place])   . Chronic anticoagulation 03/26/2017   Failed Coumadin (PE with therapeutic INR). Now on Xarelto  . Colon polyps   . Community acquired pneumonia of right lower lobe of lung ([place])   . COPD (chronic obstructive pulmonary disease) ([place])   . Degenerative disc disease, lumbar   . Diabetes mellitus without complication ([place])   . History of pulmonary embolus (PE) 03/26/2017  . Hypertension   . Mitral regurgitation 12/26/2012  . Obesity (BMI 30-39.9) 12/09/2012  . PAF (paroxysmal atrial fibrillation) ([place])    DCCV in 2014 with recurrent PAF in setting of respiratory failure June 2018. S/P DCCV 04/13/17 to NSR  . Persistent atrial fibrillation   . Pneumonia ~ 2016  . Sepsis ([place]) 03/10/2017  . Sleep apnea   . Systolic murmur   . Type II diabetes mellitus ([place])    Past Surgical History:  Procedure Laterality Date  .  CARDIOVERSION N/A 01/09/2013   Procedure: CARDIOVERSION;  Surgeon: Fay Records, MD;  Location: [place];  Service: Cardiovascular;  Laterality: N/A;  . CARDIOVERSION N/A 04/13/2017   Procedure: CARDIOVERSION;  Surgeon: Sueanne Margarita, MD;  Location: [facility] ENDOSCOPY;  Service: Cardiovascular;  Laterality: N/A;  . TEE WITHOUT CARDIOVERSION N/A 01/09/2013   Procedure: TRANSESOPHAGEAL ECHOCARDIOGRAM (TEE);  Surgeon: Fay Records, MD;  Location: [facility] ENDOSCOPY;  Service: Cardiovascular;  Laterality: N/A;   Current Outpatient Medications on File Prior to Visit  Medication Sig Dispense Refill  . Dulaglutide (TRULICITY) 1.5 PF/7.9KW SOPN INJECT 1.5 MG INTO THE SKIN ONCE A WEEK 4 pen 3  . furosemide (LASIX) 40 MG tablet Take 2 tablets (80 mg total) by mouth 2 (two) times daily. 360 tablet 3  . metoprolol tartrate (LOPRESSOR) 50 MG tablet Take 1 tablet (50 mg total) by mouth 2 (two) times daily. 180 tablet 3  . potassium chloride SA (K-DUR,KLOR-CON) 20 MEQ tablet Take 1 tablet (20 mEq total) by mouth daily. 90 tablet 3  . rivaroxaban (XARELTO) 20 MG TABS tablet TAKE 1 TABLET BY MOUTH ONCE DAILY WITH SUPPER 90 tablet 3  . sitaGLIPtin (JANUVIA) 100 MG tablet Take 1 tablet (100 mg total) by mouth daily. 90 tablet 2   No current facility-administered medications on file prior to visit.    No Known Allergies Social History  Socioeconomic History  . Marital status: Married    Spouse name: Not on file  . Number of children: 2  . Years of education: Not on file  . Highest education level: Not on file  Occupational History    Comment: Machinist  Social Needs  . Financial resource strain: Not on file  . Food insecurity    Worry: Never true    Inability: Never true  . Transportation needs    Medical: No    Non-medical: No  Tobacco Use  . Smoking status: Former Smoker    Packs/day: 1.00    Years: 43.00    Pack years: 43.00    Types: Cigarettes    Quit date: 03/08/2017    Years since quitting: 1.9   . Smokeless tobacco: Never Used  Substance and Sexual Activity  . Alcohol use: No    Comment: Rare  . Drug use: No  . Sexual activity: Not Currently  Lifestyle  . Physical activity    Days per week: Not on file    Minutes per session: Not on file  . Stress: Not on file  Relationships  . Social Musician on phone: Not on file    Gets together: Not on file    Attends religious service: Not on file    Active member of club or organization: Not on file    Attends meetings of clubs or organizations: Not on file    Relationship status: Not on file  . Intimate partner violence    Fear of current or ex partner: Not on file    Emotionally abused: Not on file    Physically abused: Not on file    Forced sexual activity: Not on file  Other Topics Concern  . Not on file  Social History Narrative  . Not on file     Review of Systems  All other systems reviewed and are negative.      Objective:   Physical Exam Vitals signs reviewed.  Constitutional:      Appearance: He is obese.  Cardiovascular:     Rate and Rhythm: Normal rate.     Heart sounds: Normal heart sounds.  Pulmonary:     Effort: Pulmonary effort is normal. No respiratory distress.     Breath sounds: Normal breath sounds. No wheezing or rhonchi.  Neurological:     Mental Status: He is alert.           Assessment & Plan:  Diabetic polyneuropathy getting gabapentin and up titrate to 300 mg 3 times a day as needed for nerve pain.  Increase as tolerated and as needed.

## 2019-03-14 ENCOUNTER — Other Ambulatory Visit: Payer: Self-pay

## 2019-03-14 NOTE — Patient Outreach (Signed)
  [facility]) Care Management Chronic Special Needs Program    03/14/2019  Name: William Alvarado, DOB: 12-26-1950  MRN: [id number]   [name] is enrolled in a chronic special needs plan for Diabetes. Call from client with questions about how much a walker would cost him as he would like to have a seated walker to use when he feels unsteady. Encouraged client to discuss need for walker with his primary care doctor.  Instructed that equipment is usually covered at 80% with him paying 20%.  Instructed that he needs to contact his concierge to see if a seated walker is covered item. Followed up with client about his foot pain and how the gabapentin is working.  States he is gradually starting to notice that it is helping with his feet Plan to outreach on next scheduled call   Peter Garter RN, [facility] [place], [facility] [phone]

## 2019-03-21 ENCOUNTER — Telehealth: Payer: Self-pay

## 2019-03-21 ENCOUNTER — Other Ambulatory Visit: Payer: Self-pay

## 2019-03-21 ENCOUNTER — Encounter: Payer: Self-pay | Admitting: Family Medicine

## 2019-03-21 ENCOUNTER — Ambulatory Visit (INDEPENDENT_AMBULATORY_CARE_PROVIDER_SITE_OTHER): Payer: HMO | Admitting: Family Medicine

## 2019-03-21 VITALS — BP 138/78 | HR 98 | Temp 98.5°F | Resp 16 | Ht 73.0 in | Wt 320.0 lb

## 2019-03-21 DIAGNOSIS — I83028 Varicose veins of left lower extremity with ulcer other part of lower leg: Secondary | ICD-10-CM

## 2019-03-21 DIAGNOSIS — I83009 Varicose veins of unspecified lower extremity with ulcer of unspecified site: Secondary | ICD-10-CM | POA: Insufficient documentation

## 2019-03-21 DIAGNOSIS — G4733 Obstructive sleep apnea (adult) (pediatric): Secondary | ICD-10-CM | POA: Diagnosis not present

## 2019-03-21 DIAGNOSIS — R2681 Unsteadiness on feet: Secondary | ICD-10-CM | POA: Diagnosis not present

## 2019-03-21 DIAGNOSIS — I5042 Chronic combined systolic (congestive) and diastolic (congestive) heart failure: Secondary | ICD-10-CM | POA: Diagnosis not present

## 2019-03-21 DIAGNOSIS — I739 Peripheral vascular disease, unspecified: Secondary | ICD-10-CM

## 2019-03-21 DIAGNOSIS — L97821 Non-pressure chronic ulcer of other part of left lower leg limited to breakdown of skin: Secondary | ICD-10-CM | POA: Diagnosis not present

## 2019-03-21 DIAGNOSIS — R0902 Hypoxemia: Secondary | ICD-10-CM | POA: Diagnosis not present

## 2019-03-21 DIAGNOSIS — R531 Weakness: Secondary | ICD-10-CM

## 2019-03-21 DIAGNOSIS — I509 Heart failure, unspecified: Secondary | ICD-10-CM | POA: Insufficient documentation

## 2019-03-21 NOTE — Telephone Encounter (Signed)
Pt's wife called to report that pt is having swelling in his leg and it is leaking. Wife would like to know if you can prescribe some antibiotic cream and she can wrap them. Or would you rather him come in office. I told her that the schedule was full and I would see what you wanted to do. Please advise.

## 2019-03-21 NOTE — Assessment & Plan Note (Signed)
Peripheral  Vascular disease with his chronic peripheral edema he also has diabetic neuropathy.  Will refer him directly to vascular surgeon per wife when he went for arterial and venous studies in the past he ended up with a DVT just laying flat today would like to have a consultation with the vascular surgeon first.  He is unable to get his compression hose on.  Today he has venous stasis ulcers which he has had in the past.  I Minna place [employer] on him they will come off in 3 days.  Wife will come to my office she is used to doing this.  And they will go back to his regular Ace wraps for now.

## 2019-03-21 NOTE — Progress Notes (Signed)
Subjective:    Patient ID: William Alvarado, male    DOB: [birth year], 68 y.o.   MRN: [id number]  Patient presents for Ulcer to R Leg and Hypoxia (SPO2 83% on RA- increased to 92% on 2L/min via [place])  Patient here with leaking ulcer on his left lower extremity for the past few weeks.  Wife says they have been trying to work on at home using antibiotic ointment and keeping it wrapped.  He typically has Ace wrap on his legs as he cannot use his compression hose.  He has chronic peripheral edema along with diabetic neuropathy and chronic venous stasis changes.  He does get chronic leg pain associated to this along with weakness.  He rarely exerts himself when he does get short of breath and he feels weak in his legs.  He is interested in some therapy to help with this.  He was recently seen and placed on gabapentin as he wanted something to help with the nerve pain however this made him a little dizzy he also started to gain a significant amount of weight states that his weight went up 26 pounds but he tapered off of it and his weight is back down to his baseline. Darcy's breathing he was on oxygen in the past but he could not tell if it helped as much except for when he went to sleep.  He also has history of sleep apnea but he did not tolerate his CPAP machine so no longer uses this. Sitting in the office his oxygen saturation was down in the low 80s but did come up with 2 L.     A1C was  8.5%, blood sugars improved     Review Of Systems:  GEN- denies fatigue, fever, weight loss,+weakness, recent illness HEENT- denies eye drainage, change in vision, nasal discharge, CVS- denies chest pain, palpitations RESP- denies SOB, cough, wheeze ABD- denies N/V, change in stools, abd pain GU- denies dysuria, hematuria, dribbling, incontinence MSK- + joint pain, muscle aches, injury Neuro- denies headache, dizziness, syncope, seizure activity       Objective:    BP 138/78   Pulse 98   Temp 98.5 F  (36.9 C) (Oral)   Resp 16   Ht 6\' 1"  (1.854 m)   Wt (!) 320 lb (145.2 kg)   SpO2 92% Comment: 2L/min via [place]  BMI 42.22 kg/m  GEN- NAD, alert and oriented x3  ,  Note 83% on RA  HEENT- PERRL, EOMI, non injected sclera, pink conjunctiva, MMM, oropharynx clear Neck- Supple, no thyromegaly, no JVD CVS- irregular rhythem, normal rate, no murmur RESP-CTAB ABD-NABS,soft,NT,ND EXT- Bilat swelling with red hue to bilat feet, legs, mild swelling at ankles, quarter size superficial ulceration, small ulceration , small pinpoint lesion on right leg on medial aspect  MSK- decreased ROM Spine, hips/knees, walks with assistance  Pulses- Radial, 2+ DPdminished       Assessment & Plan:  Recheck Monday or Tuesday next week we will also follow-up his diabetes and labs at that time   Problem List Items Addressed This Visit      Unprioritized   CHF (congestive heart failure) ([place])    Hypoxia at rest O2 sat 83% on room air.  He did improve up to 92 to 94% on 2 L.  He was more comfortable as well.  He has underlying sleep apnea but he is unable to tolerate his CPAP so this is also contributing to his hypoxia.  He has significant comorbidities and  also states he has been told that he has some COPD as well. I will start him on home oxygen therapy at 2 L.      Gait instability    Gait instability with generalized weakness of the lower extremities.  He has known degenerative disc disease of the lumbar spine as well.  He has very little activity and is significantly deconditioned.  He is also morbidly obese weighing over 300 pounds.  I think that he would benefit from some type of physical therapy within the home.  He is also unsteady on his feet often uses a cane he does request a walker to help assist him with his walking.  Rollator has been ordered.  Home physical therapy referral      Relevant Orders   Ambulatory referral to Home Health   Hypoxia   OSA (obstructive sleep apnea)   PVD (peripheral  vascular disease) (HCC)    Peripheral  Vascular disease with his chronic peripheral edema he also has diabetic neuropathy.  Will refer him directly to vascular surgeon per wife when he went for arterial and venous studies in the past he ended up with a DVT just laying flat today would like to have a consultation with the vascular surgeon first.  He is unable to get his compression hose on.  Today he has venous stasis ulcers which he has had in the past.  I Minna place [employer] on him they will come off in 3 days.  Wife will come to my office she is used to doing this.  And they will go back to his regular Ace wraps for now.      Relevant Orders   Ambulatory referral to Vascular Surgery   Venous stasis ulcer (HCC)    Unna boots placed        Other Visit Diagnoses    Generalized weakness    -  Primary   Relevant Orders   Ambulatory referral to Home Health      Note: This dictation was prepared with Dragon dictation along with smaller phrase technology. Any transcriptional errors that result from this process are unintentional.

## 2019-03-21 NOTE — Assessment & Plan Note (Signed)
Gait instability with generalized weakness of the lower extremities.  He has known degenerative disc disease of the lumbar spine as well.  He has very little activity and is significantly deconditioned.  He is also morbidly obese weighing over 300 pounds.  I think that he would benefit from some type of physical therapy within the home.  He is also unsteady on his feet often uses a cane he does request a walker to help assist him with his walking.  Rollator has been ordered.  Home physical therapy referral

## 2019-03-21 NOTE — Telephone Encounter (Signed)
Noted  

## 2019-03-21 NOTE — Assessment & Plan Note (Signed)
Unna boots placed

## 2019-03-21 NOTE — Patient Instructions (Addendum)
Get the rolling walker  Remove the unna boot on Friday Oxygen to be ordered  Referral to Vascular Surgeon F/U Monday or Tuesday with Dr. Dennard Schaumann

## 2019-03-21 NOTE — Telephone Encounter (Signed)
He can come in at 12:15 he will be double booked so will have to wait but I will see him He MAY NEED unna boots

## 2019-03-21 NOTE — Assessment & Plan Note (Addendum)
Hypoxia at rest O2 sat 83% on room air.  He did improve up to 92 to 94% on 2 L.  He was more comfortable as well.  He has underlying sleep apnea but he is unable to tolerate his CPAP so this is also contributing to his hypoxia.  He has significant comorbidities and also states he has been told that he has some COPD as well. I will start him on home oxygen therapy at 2 L.

## 2019-03-23 DIAGNOSIS — I5042 Chronic combined systolic (congestive) and diastolic (congestive) heart failure: Secondary | ICD-10-CM | POA: Diagnosis not present

## 2019-03-23 DIAGNOSIS — R0902 Hypoxemia: Secondary | ICD-10-CM | POA: Diagnosis not present

## 2019-03-28 ENCOUNTER — Other Ambulatory Visit: Payer: Self-pay

## 2019-03-28 ENCOUNTER — Ambulatory Visit (INDEPENDENT_AMBULATORY_CARE_PROVIDER_SITE_OTHER): Payer: HMO | Admitting: Family Medicine

## 2019-03-28 ENCOUNTER — Encounter: Payer: Self-pay | Admitting: Family Medicine

## 2019-03-28 VITALS — BP 128/76 | HR 100 | Temp 98.6°F | Resp 18 | Ht 73.0 in | Wt 322.0 lb

## 2019-03-28 DIAGNOSIS — I50811 Acute right heart failure: Secondary | ICD-10-CM | POA: Diagnosis not present

## 2019-03-28 DIAGNOSIS — R600 Localized edema: Secondary | ICD-10-CM

## 2019-03-28 NOTE — Progress Notes (Signed)
Subjective:    Patient ID: William Alvarado, male    DOB: Oct 05, 1950, 68 y.o.   MRN: [id number]  HPI  Patient was seen by my partner last week for generalized fatigue.  He was found to be hypoxic and was started on 2 L of oxygen.  He is wearing the oxygen when he is at home.  He is not wearing at night when he sleeps.  He is also not wearing it when he is out away from the house.  It is presumed that his hypoxia is secondary to COPD coupled with obesity hypoventilation syndrome and his obstructive sleep apnea which the patient is not currently treating due to the fact he cannot tolerate CPAP.  I believe all of these risk factors put him at high risk for right-sided congestive heart failure/cor pulmonale.  He is recently gained significant weight.  His normal weight is around 300 pounds but he is up to 320 pounds.  He also has edema in both legs distal to his knee.  He has numerous varicose veins and in fact his legs distal to his knee have a violet purple hue but normal capillary refill.  I believe the purple discoloration is due to venous congestion due to his chronic venous insufficiency.  Patient had completely normal ABIs checked in 2018 to rule out peripheral vascular disease.  Patient has a venous stasis ulcer on the posterior left leg.  It is roughly the diameter of a quarter.  He also has developed a very shallow ulcer on the anterior surface of the right leg just above the ankle that is roughly the diameter of a dime. Past Medical History:  Diagnosis Date  . Acute respiratory failure with hypoxia (HCC)    Hospitalized with acute respiratory failure secondary to CAP, diastolic CHF, and pulmonary embolism  . AKI (acute kidney injury) ([place])   . Allergy    allergic rhinitis  . Altered mental status   . Atrial fibrillation ([place])   . Chronic anticoagulation 03/26/2017   Failed Coumadin (PE with therapeutic INR). Now on Xarelto  . Colon polyps   . Community acquired pneumonia of right lower  lobe of lung ([place])   . COPD (chronic obstructive pulmonary disease) ([place])   . Degenerative disc disease, lumbar   . Diabetes mellitus without complication ([place])   . History of pulmonary embolus (PE) 03/26/2017  . Hypertension   . Mitral regurgitation 12/26/2012  . Obesity (BMI 30-39.9) 12/09/2012  . PAF (paroxysmal atrial fibrillation) ([place])    DCCV in 2014 with recurrent PAF in setting of respiratory failure June 2018. S/P DCCV 04/13/17 to NSR  . Persistent atrial fibrillation   . Pneumonia ~ 2016  . Sepsis ([place]) 03/10/2017  . Sleep apnea   . Systolic murmur   . Type II diabetes mellitus ([place])    Past Surgical History:  Procedure Laterality Date  . CARDIOVERSION N/A 01/09/2013   Procedure: CARDIOVERSION;  Surgeon: Fay Records, MD;  Location: [place];  Service: Cardiovascular;  Laterality: N/A;  . CARDIOVERSION N/A 04/13/2017   Procedure: CARDIOVERSION;  Surgeon: Sueanne Margarita, MD;  Location[facility] ENDOSCOPY;  Service: Cardiovascular;  Laterality: N/A;  . TEE WITHOUT CARDIOVERSION N/A 01/09/2013   Procedure: TRANSESOPHAGEAL ECHOCARDIOGRAM (TEE);  Surgeon: Fay Records, MD;  Location: [facility] ENDOSCOPY;  Service: Cardiovascular;  Laterality: N/A;   Current Outpatient Medications on File Prior to Visit  Medication Sig Dispense Refill  . Dulaglutide (TRULICITY) 1.5 CW/2.3JS SOPN INJECT 1.5 MG INTO THE SKIN  ONCE A WEEK 4 pen 3  . furosemide (LASIX) 40 MG tablet Take 2 tablets (80 mg total) by mouth 2 (two) times daily. 360 tablet 3  . metoprolol tartrate (LOPRESSOR) 50 MG tablet Take 1 tablet (50 mg total) by mouth 2 (two) times daily. 180 tablet 3  . potassium chloride SA (K-DUR,KLOR-CON) 20 MEQ tablet Take 1 tablet (20 mEq total) by mouth daily. 90 tablet 3  . rivaroxaban (XARELTO) 20 MG TABS tablet TAKE 1 TABLET BY MOUTH ONCE DAILY WITH SUPPER 90 tablet 3  . sitaGLIPtin (JANUVIA) 100 MG tablet Take 1 tablet (100 mg total) by mouth daily. 90 tablet 2   No current facility-administered  medications on file prior to visit.    No Known Allergies Social History   Socioeconomic History  . Marital status: Married    Spouse name: Not on file  . Number of children: 2  . Years of education: Not on file  . Highest education level: Not on file  Occupational History    Comment: Machinist  Social Needs  . Financial resource strain: Not on file  . Food insecurity    Worry: Never true    Inability: Never true  . Transportation needs    Medical: No    Non-medical: No  Tobacco Use  . Smoking status: Former Smoker    Packs/day: 1.00    Years: 43.00    Pack years: 43.00    Types: Cigarettes    Quit date: 03/08/2017    Years since quitting: 2.0  . Smokeless tobacco: Never Used  Substance and Sexual Activity  . Alcohol use: No    Comment: Rare  . Drug use: No  . Sexual activity: Not Currently  Lifestyle  . Physical activity    Days per week: Not on file    Minutes per session: Not on file  . Stress: Not on file  Relationships  . Social Musicianconnections    Talks on phone: Not on file    Gets together: Not on file    Attends religious service: Not on file    Active member of club or organization: Not on file    Attends meetings of clubs or organizations: Not on file    Relationship status: Not on file  . Intimate partner violence    Fear of current or ex partner: Not on file    Emotionally abused: Not on file    Physically abused: Not on file    Forced sexual activity: Not on file  Other Topics Concern  . Not on file  Social History Narrative  . Not on file     Review of Systems  All other systems reviewed and are negative.      Objective:   Physical Exam Vitals signs reviewed.  Constitutional:      Appearance: He is obese.  Cardiovascular:     Rate and Rhythm: Normal rate. Rhythm irregular.     Heart sounds: Normal heart sounds.  Pulmonary:     Effort: Pulmonary effort is normal. No respiratory distress.     Breath sounds: Wheezing present. No rhonchi  or rales.  Abdominal:     General: There is distension.     Palpations: Abdomen is soft.     Tenderness: There is no abdominal tenderness. There is no guarding or rebound.  Musculoskeletal:     Right lower leg: Edema present.     Left lower leg: Edema present.  Neurological:     Mental Status: He is  alert.           Assessment & Plan:  The encounter diagnosis was Acute right-sided heart failure (HCC). I believe the patient's hypoxia is likely due to acute right-sided heart failure on top of his longstanding COPD, morbid obesity, and obesity hypoventilation syndrome.  I believe this is also compounded by his untreated obstructive sleep apnea.  I believe the patient needs diuresis.  I recommended he continue his Lasix 80 mg twice daily but I will give the patient Zaroxolyn 5 mg 30 minutes prior to his a.m. dose of Lasix tomorrow morning.  I will recheck the patient on Thursday to monitor his renal function closely as previously the patient was admitted to the hospital with prerenal azotemia and acute renal failure secondary to overdiuresis in the past.  Patient was also placed in Unna boots bilaterally to facilitate diuresis and also hopefully help heal the venous stasis ulcers on both legs.  I encouraged the patient to be compliant wearing his oxygen as this would help treat his underlying right-sided heart failure.

## 2019-03-29 LAB — CBC WITH DIFFERENTIAL/PLATELET
Absolute Monocytes: 950 cells/uL (ref 200–950)
Basophils Absolute: 79 cells/uL (ref 0–200)
Basophils Relative: 0.8 %
Eosinophils Absolute: 139 cells/uL (ref 15–500)
Eosinophils Relative: 1.4 %
HCT: 46.4 % (ref 38.5–50.0)
Hemoglobin: 14.7 g/dL (ref 13.2–17.1)
Lymphs Abs: 1030 cells/uL (ref 850–3900)
MCH: 27.7 pg (ref 27.0–33.0)
MCHC: 31.7 g/dL — ABNORMAL LOW (ref 32.0–36.0)
MCV: 87.4 fL (ref 80.0–100.0)
MPV: 10.2 fL (ref 7.5–12.5)
Monocytes Relative: 9.6 %
Neutro Abs: 7702 cells/uL (ref 1500–7800)
Neutrophils Relative %: 77.8 %
Platelets: 293 10*3/uL (ref 140–400)
RBC: 5.31 10*6/uL (ref 4.20–5.80)
RDW: 15.6 % — ABNORMAL HIGH (ref 11.0–15.0)
Total Lymphocyte: 10.4 %
WBC: 9.9 10*3/uL (ref 3.8–10.8)

## 2019-03-29 LAB — COMPLETE METABOLIC PANEL WITH GFR
AG Ratio: 1.2 (calc) (ref 1.0–2.5)
ALT: 9 U/L (ref 9–46)
AST: 12 U/L (ref 10–35)
Albumin: 3.8 g/dL (ref 3.6–5.1)
Alkaline phosphatase (APISO): 75 U/L (ref 35–144)
BUN/Creatinine Ratio: 15 (calc) (ref 6–22)
BUN: 22 mg/dL (ref 7–25)
CO2: 36 mmol/L — ABNORMAL HIGH (ref 20–32)
Calcium: 8.6 mg/dL (ref 8.6–10.3)
Chloride: 95 mmol/L — ABNORMAL LOW (ref 98–110)
Creat: 1.51 mg/dL — ABNORMAL HIGH (ref 0.70–1.25)
GFR, Est African American: 55 mL/min/{1.73_m2} — ABNORMAL LOW (ref >=60)
GFR, Est Non African American: 48 mL/min/{1.73_m2} — ABNORMAL LOW (ref >=60)
Globulin: 3.3 g/dL (calc) (ref 1.9–3.7)
Glucose, Bld: 173 mg/dL — ABNORMAL HIGH (ref 65–99)
Potassium: 4.2 mmol/L (ref 3.5–5.3)
Sodium: 134 mmol/L — ABNORMAL LOW (ref 135–146)
Total Bilirubin: 0.7 mg/dL (ref 0.2–1.2)
Total Protein: 7.1 g/dL (ref 6.1–8.1)

## 2019-03-29 LAB — BRAIN NATRIURETIC PEPTIDE: Brain Natriuretic Peptide: 284 pg/mL — ABNORMAL HIGH (ref <100)

## 2019-03-30 ENCOUNTER — Other Ambulatory Visit: Payer: Self-pay

## 2019-03-30 ENCOUNTER — Encounter: Payer: Self-pay | Admitting: Family Medicine

## 2019-03-30 ENCOUNTER — Ambulatory Visit (INDEPENDENT_AMBULATORY_CARE_PROVIDER_SITE_OTHER): Payer: HMO | Admitting: Family Medicine

## 2019-03-30 VITALS — BP 120/80 | HR 92 | Temp 99.3°F | Resp 22 | Ht 73.0 in | Wt 317.0 lb

## 2019-03-30 DIAGNOSIS — R0902 Hypoxemia: Secondary | ICD-10-CM

## 2019-03-30 DIAGNOSIS — I50811 Acute right heart failure: Secondary | ICD-10-CM | POA: Diagnosis not present

## 2019-03-30 NOTE — Progress Notes (Signed)
Subjective:    Patient ID: William Alvarado, male    DOB: February 25, 1951, 68 y.o.   MRN: [id number]  HPI 03/28/19 Patient was seen by my partner last week for generalized fatigue.  He was found to be hypoxic and was started on 2 L of oxygen.  He is wearing the oxygen when he is at home.  He is not wearing at night when he sleeps.  He is also not wearing it when he is out away from the house.  It is presumed that his hypoxia is secondary to COPD coupled with obesity hypoventilation syndrome and his obstructive sleep apnea which the patient is not currently treating due to the fact he cannot tolerate CPAP.  I believe all of these risk factors put him at high risk for right-sided congestive heart failure/cor pulmonale.  He is recently gained significant weight.  His normal weight is around 300 pounds but he is up to 320 pounds.  He also has edema in both legs distal to his knee.  He has numerous varicose veins and in fact his legs distal to his knee have a violet purple hue but normal capillary refill.  I believe the purple discoloration is due to venous congestion due to his chronic venous insufficiency.  Patient had completely normal ABIs checked in 2018 to rule out peripheral vascular disease.  Patient has a venous stasis ulcer on the posterior left leg.  It is roughly the diameter of a quarter.  He also has developed a very shallow ulcer on the anterior surface of the right leg just above the ankle that is roughly the diameter of a dime.  At that time, my plan was: I believe the patient's hypoxia is likely due to acute right-sided heart failure on top of his longstanding COPD, morbid obesity, and obesity hypoventilation syndrome.  I believe this is also compounded by his untreated obstructive sleep apnea.  I believe the patient needs diuresis.  I recommended he continue his Lasix 80 mg twice daily but I will give the patient Zaroxolyn 5 mg 30 minutes prior to his a.m. dose of Lasix tomorrow morning.  I will  recheck the patient on Thursday to monitor his renal function closely as previously the patient was admitted to the hospital with prerenal azotemia and acute renal failure secondary to overdiuresis in the past.  Patient was also placed in Unna boots bilaterally to facilitate diuresis and also hopefully help heal the venous stasis ulcers on both legs.  I encouraged the patient to be compliant wearing his oxygen as this would help treat his underlying right-sided heart failure.  03/30/19 Office Visit on 03/28/2019  Component Date Value Ref Range Status  . Brain Natriuretic Peptide 03/28/2019 284* <100 pg/mL Final   Comment: . BNP levels increase with age in the general population with the highest values seen in individuals greater than 66 years of age. Reference: J. Am. Ladon Applebaumoll. Cardiol. 2002; 40:981-191; 40:976-982. .   . Glucose, Bld 03/28/2019 173* 65 - 99 mg/dL Final   Comment: .            Fasting reference interval . For someone without known diabetes, a glucose value >125 mg/dL indicates that they may have diabetes and this should be confirmed with a follow-up test. .   . BUN 03/28/2019 22  7 - 25 mg/dL Final  . Creat 47/82/956207/03/2019 1.51* 0.70 - 1.25 mg/dL Final   Comment: For patients >66 years of age, the reference limit for Creatinine is approximately 13%  higher for people identified as African-American. .   . GFR, Est Non African American 03/28/2019 48* > OR = 60 mL/min/1.5873m2 Final  . GFR, Est African American 03/28/2019 55* > OR = 60 mL/min/1.3173m2 Final  . BUN/Creatinine Ratio 03/28/2019 15  6 - 22 (calc) Final  . Sodium 03/28/2019 134* 135 - 146 mmol/L Final  . Potassium 03/28/2019 4.2  3.5 - 5.3 mmol/L Final  . Chloride 03/28/2019 95* 98 - 110 mmol/L Final  . CO2 03/28/2019 36* 20 - 32 mmol/L Final  . Calcium 03/28/2019 8.6  8.6 - 10.3 mg/dL Final  . Total Protein 03/28/2019 7.1  6.1 - 8.1 g/dL Final  . Albumin 16/10/960407/03/2019 3.8  3.6 - 5.1 g/dL Final  . Globulin 54/09/811907/03/2019 3.3  1.9 - 3.7  g/dL (calc) Final  . AG Ratio 03/28/2019 1.2  1.0 - 2.5 (calc) Final  . Total Bilirubin 03/28/2019 0.7  0.2 - 1.2 mg/dL Final  . Alkaline phosphatase (APISO) 03/28/2019 75  35 - 144 U/L Final  . AST 03/28/2019 12  10 - 35 U/L Final  . ALT 03/28/2019 9  9 - 46 U/L Final  . WBC 03/28/2019 9.9  3.8 - 10.8 Thousand/uL Final  . RBC 03/28/2019 5.31  4.20 - 5.80 Million/uL Final  . Hemoglobin 03/28/2019 14.7  13.2 - 17.1 g/dL Final  . HCT 14/78/295607/03/2019 46.4  38.5 - 50.0 % Final  . MCV 03/28/2019 87.4  80.0 - 100.0 fL Final  . MCH 03/28/2019 27.7  27.0 - 33.0 pg Final  . MCHC 03/28/2019 31.7* 32.0 - 36.0 g/dL Final  . RDW 21/30/865707/03/2019 15.6* 11.0 - 15.0 % Final  . Platelets 03/28/2019 293  140 - 400 Thousand/uL Final  . MPV 03/28/2019 10.2  7.5 - 12.5 fL Final  . Neutro Abs 03/28/2019 7,702  1,500 - 7,800 cells/uL Final  . Lymphs Abs 03/28/2019 1,030  850 - 3,900 cells/uL Final  . Absolute Monocytes 03/28/2019 950  200 - 950 cells/uL Final  . Eosinophils Absolute 03/28/2019 139  15 - 500 cells/uL Final  . Basophils Absolute 03/28/2019 79  0 - 200 cells/uL Final  . Neutrophils Relative % 03/28/2019 77.8  % Final  . Total Lymphocyte 03/28/2019 10.4  % Final  . Monocytes Relative 03/28/2019 9.6  % Final  . Eosinophils Relative 03/28/2019 1.4  % Final  . Basophils Relative 03/28/2019 0.8  % Final   As demonstrated above, BNP was slightly elevated. Wt Readings from Last 3 Encounters:  03/30/19 (!) 317 lb (143.8 kg)  03/28/19 (!) 322 lb (146.1 kg)  03/21/19 (!) 320 lb (145.2 kg)   With a combination of Unna boots and premedication with Zaroxolyn prior to Lasix, the patient diuresed 5 pounds.  The swelling in his legs distal to the Unna boots has improved dramatically.  However his hypoxia is no better.  After walking into the clinic today, he was 84% on room air.  On 2 L via nasal cannula his oxygen level increased to 93%.  He continues to report feeling fatigued and short of breath with activity.  He  denies any cough.  He denies any wheezing.  He denies any fevers or chills.  The last time he presented like this, however diurese the patient and he wound up in the hospital with acute renal insufficiency coupled with an undiagnosed pneumonia.  Although he has no symptoms of pneumonia, his presentation was similar last time.  Past Medical History:  Diagnosis Date  . Acute respiratory failure with hypoxia (  [place])    Hospitalized with acute respiratory failure secondary to CAP, diastolic CHF, and pulmonary embolism  . AKI (acute kidney injury) ([place])   . Allergy    allergic rhinitis  . Altered mental status   . Atrial fibrillation ([place])   . Chronic anticoagulation 03/26/2017   Failed Coumadin (PE with therapeutic INR). Now on Xarelto  . Colon polyps   . Community acquired pneumonia of right lower lobe of lung ([place])   . COPD (chronic obstructive pulmonary disease) ([place])   . Degenerative disc disease, lumbar   . Diabetes mellitus without complication ([place])   . History of pulmonary embolus (PE) 03/26/2017  . Hypertension   . Mitral regurgitation 12/26/2012  . Obesity (BMI 30-39.9) 12/09/2012  . PAF (paroxysmal atrial fibrillation) ([place])    DCCV in 2014 with recurrent PAF in setting of respiratory failure June 2018. S/P DCCV 04/13/17 to NSR  . Persistent atrial fibrillation   . Pneumonia ~ 2016  . Sepsis ([place]) 03/10/2017  . Sleep apnea   . Systolic murmur   . Type II diabetes mellitus ([place])    Past Surgical History:  Procedure Laterality Date  . CARDIOVERSION N/A 01/09/2013   Procedure: CARDIOVERSION;  Surgeon: Fay Records, MD;  Location: [place];  Service: Cardiovascular;  Laterality: N/A;  . CARDIOVERSION N/A 04/13/2017   Procedure: CARDIOVERSION;  Surgeon: Sueanne Margarita, MD;  Location[facility] ENDOSCOPY;  Service: Cardiovascular;  Laterality: N/A;  . TEE WITHOUT CARDIOVERSION N/A 01/09/2013   Procedure: TRANSESOPHAGEAL ECHOCARDIOGRAM (TEE);  Surgeon: Fay Records, MD;  Location[facility] END[facility]COPY;   Service: Cardiovascular;  Laterality: N/A;   Current Outpatient Medications on File Prior to Visit  Medication Sig Dispense Refill  . Dulaglutide (TRULICITY) 1.5 FF/6.3WG SOPN INJECT 1.5 MG INTO THE SKIN ONCE A WEEK 4 pen 3  . furosemide (LASIX) 40 MG tablet Take 2 tablets (80 mg total) by mouth 2 (two) times daily. 360 tablet 3  . metoprolol tartrate (LOPRESSOR) 50 MG tablet Take 1 tablet (50 mg total) by mouth 2 (two) times daily. 180 tablet 3  . potassium chloride SA (K-DUR,KLOR-CON) 20 MEQ tablet Take 1 tablet (20 mEq total) by mouth daily. 90 tablet 3  . rivaroxaban (XARELTO) 20 MG TABS tablet TAKE 1 TABLET BY MOUTH ONCE DAILY WITH SUPPER 90 tablet 3  . sitaGLIPtin (JANUVIA) 100 MG tablet Take 1 tablet (100 mg total) by mouth daily. 90 tablet 2   No current facility-administered medications on file prior to visit.    No Known Allergies Social History   Socioeconomic History  . Marital status: Married    Spouse name: Not on file  . Number of children: 2  . Years of education: Not on file  . Highest education level: Not on file  Occupational History    Comment: Machinist  Social Needs  . Financial resource strain: Not on file  . Food insecurity    Worry: Never true    Inability: Never true  . Transportation needs    Medical: No    Non-medical: No  Tobacco Use  . Smoking status: Former Smoker    Packs/day: 1.00    Years: 43.00    Pack years: 43.00    Types: Cigarettes    Quit date: 03/08/2017    Years since quitting: 2.0  . Smokeless tobacco: Never Used  Substance and Sexual Activity  . Alcohol use: No    Comment: Rare  . Drug use: No  . Sexual activity: Not Currently  Lifestyle  .  Physical activity    Days per week: Not on file    Minutes per session: Not on file  . Stress: Not on file  Relationships  . Social Musician on phone: Not on file    Gets together: Not on file    Attends religious service: Not on file    Active member of club or  organization: Not on file    Attends meetings of clubs or organizations: Not on file    Relationship status: Not on file  . Intimate partner violence    Fear of current or ex partner: Not on file    Emotionally abused: Not on file    Physically abused: Not on file    Forced sexual activity: Not on file  Other Topics Concern  . Not on file  Social History Narrative  . Not on file     Review of Systems  All other systems reviewed and are negative.      Objective:   Physical Exam Vitals signs reviewed.  Constitutional:      Appearance: He is obese.  Cardiovascular:     Rate and Rhythm: Normal rate. Rhythm irregular.     Heart sounds: Normal heart sounds.  Pulmonary:     Effort: Pulmonary effort is normal. No respiratory distress.     Breath sounds: No wheezing, rhonchi or rales.  Abdominal:     General: There is distension.     Palpations: Abdomen is soft.     Tenderness: There is no abdominal tenderness. There is no guarding or rebound.  Musculoskeletal:     Right lower leg: No edema.     Left lower leg: No edema.  Neurological:     Mental Status: He is alert.           Assessment & Plan:  The primary encounter diagnosis was Acute right-sided heart failure (HCC). A diagnosis of Hypoxia was also pertinent to this visit. The patient's exam today is not impressive for any particular cause of his hypoxia.  His lungs are relatively clear to auscultation.  There is not any wheezing or rhonchi or rales appreciated.  His respiratory rate is normal and he is in no apparent distress.  Edema has improved.  However despite 5 pounds of diuresis, his hypoxia is not better.  Therefore I recommend sending the patient for chest x-ray to evaluate for an occult pneumonia which was the case last time.  In either respect I would like to try to determine the cause of his hypoxia which has not been an issue for the patient prior to last month.  Chest x-ray is normal, we may attempt further  diuresis however I will obtain a repeat BMP today to monitor renal function closely.  Unna boots were removed bilaterally and patient was recommended to resume his Ace wraps at home.

## 2019-03-31 ENCOUNTER — Other Ambulatory Visit: Payer: Self-pay | Admitting: Family Medicine

## 2019-03-31 ENCOUNTER — Ambulatory Visit
Admission: RE | Admit: 2019-03-31 | Discharge: 2019-03-31 | Disposition: A | Discharge status: Home / Self Care | Payer: HMO | Source: Ambulatory Visit | Attending: Family Medicine | Admitting: Family Medicine

## 2019-03-31 DIAGNOSIS — I50811 Acute right heart failure: Secondary | ICD-10-CM

## 2019-03-31 DIAGNOSIS — R0602 Shortness of breath: Secondary | ICD-10-CM | POA: Diagnosis not present

## 2019-03-31 LAB — BASIC METABOLIC PANEL WITH GFR
BUN/Creatinine Ratio: 15 (calc) (ref 6–22)
BUN: 27 mg/dL — ABNORMAL HIGH (ref 7–25)
CO2: 39 mmol/L — ABNORMAL HIGH (ref 20–32)
Calcium: 9.2 mg/dL (ref 8.6–10.3)
Chloride: 86 mmol/L — ABNORMAL LOW (ref 98–110)
Creat: 1.77 mg/dL — ABNORMAL HIGH (ref 0.70–1.25)
GFR, Est African American: 46 mL/min/{1.73_m2} — ABNORMAL LOW (ref >=60)
GFR, Est Non African American: 39 mL/min/{1.73_m2} — ABNORMAL LOW (ref >=60)
Glucose, Bld: 231 mg/dL — ABNORMAL HIGH (ref 65–99)
Potassium: 3.3 mmol/L — ABNORMAL LOW (ref 3.5–5.3)
Sodium: 135 mmol/L (ref 135–146)

## 2019-03-31 MED ORDER — LEVOFLOXACIN 500 MG PO TABS: 500.0000 mg | ORAL_TABLET | Freq: Every day | ORAL | 0 refills | Status: DC

## 2019-04-03 ENCOUNTER — Telehealth: Payer: Self-pay | Admitting: Family Medicine

## 2019-04-03 NOTE — Telephone Encounter (Signed)
Pt's wife called and states that she picked up the prescription for Levaquin but has not given it to the pt b/c of the side effects of it. She states that it says on there it causes problems with legs, kidney and irregular heartbeat and she would like to talk about it before giving him this.

## 2019-04-04 DIAGNOSIS — I83018 Varicose veins of right lower extremity with ulcer other part of lower leg: Secondary | ICD-10-CM | POA: Diagnosis not present

## 2019-04-04 DIAGNOSIS — I110 Hypertensive heart disease with heart failure: Secondary | ICD-10-CM | POA: Diagnosis not present

## 2019-04-04 DIAGNOSIS — G8929 Other chronic pain: Secondary | ICD-10-CM | POA: Diagnosis not present

## 2019-04-04 DIAGNOSIS — I83028 Varicose veins of left lower extremity with ulcer other part of lower leg: Secondary | ICD-10-CM | POA: Diagnosis not present

## 2019-04-04 DIAGNOSIS — I340 Nonrheumatic mitral (valve) insufficiency: Secondary | ICD-10-CM | POA: Diagnosis not present

## 2019-04-04 DIAGNOSIS — G4733 Obstructive sleep apnea (adult) (pediatric): Secondary | ICD-10-CM | POA: Diagnosis not present

## 2019-04-04 DIAGNOSIS — E1151 Type 2 diabetes mellitus with diabetic peripheral angiopathy without gangrene: Secondary | ICD-10-CM | POA: Diagnosis not present

## 2019-04-04 DIAGNOSIS — L97811 Non-pressure chronic ulcer of other part of right lower leg limited to breakdown of skin: Secondary | ICD-10-CM | POA: Diagnosis not present

## 2019-04-04 DIAGNOSIS — Z6841 Body Mass Index (BMI) 40.0 and over, adult: Secondary | ICD-10-CM | POA: Diagnosis not present

## 2019-04-04 DIAGNOSIS — Z87891 Personal history of nicotine dependence: Secondary | ICD-10-CM | POA: Diagnosis not present

## 2019-04-04 DIAGNOSIS — K635 Polyp of colon: Secondary | ICD-10-CM | POA: Diagnosis not present

## 2019-04-04 DIAGNOSIS — E1140 Type 2 diabetes mellitus with diabetic neuropathy, unspecified: Secondary | ICD-10-CM | POA: Diagnosis not present

## 2019-04-04 DIAGNOSIS — Z86711 Personal history of pulmonary embolism: Secondary | ICD-10-CM | POA: Diagnosis not present

## 2019-04-04 DIAGNOSIS — Z7901 Long term (current) use of anticoagulants: Secondary | ICD-10-CM | POA: Diagnosis not present

## 2019-04-04 DIAGNOSIS — I480 Paroxysmal atrial fibrillation: Secondary | ICD-10-CM | POA: Diagnosis not present

## 2019-04-04 DIAGNOSIS — Z7984 Long term (current) use of oral hypoglycemic drugs: Secondary | ICD-10-CM | POA: Diagnosis not present

## 2019-04-04 DIAGNOSIS — M5136 Other intervertebral disc degeneration, lumbar region: Secondary | ICD-10-CM | POA: Diagnosis not present

## 2019-04-04 DIAGNOSIS — L97821 Non-pressure chronic ulcer of other part of left lower leg limited to breakdown of skin: Secondary | ICD-10-CM | POA: Diagnosis not present

## 2019-04-04 DIAGNOSIS — Z9181 History of falling: Secondary | ICD-10-CM | POA: Diagnosis not present

## 2019-04-04 DIAGNOSIS — I5042 Chronic combined systolic (congestive) and diastolic (congestive) heart failure: Secondary | ICD-10-CM | POA: Diagnosis not present

## 2019-04-04 MED ORDER — AMOXICILLIN-POT CLAVULANATE ER 1000-62.5 MG PO TB12: 2.0000 | ORAL_TABLET | Freq: Two times a day (BID) | ORAL | 0 refills | Status: AC

## 2019-04-04 MED ORDER — AZITHROMYCIN 250 MG PO TABS: ORAL_TABLET | ORAL | 0 refills | Status: DC

## 2019-04-04 NOTE — Telephone Encounter (Signed)
Despite the warnings associated with the medication I still believe it would be safe for him to take this. If they still feel uncomfortable he could try Augmentin extended release 2000 mg twice daily for 7 days coupled with a Z-Pak.  This combination would achieve similar bacterial coverage

## 2019-04-04 NOTE — Telephone Encounter (Signed)
Pt's wife does not want him to take Levaquin d/t possible SE - other meds sent to pharm.

## 2019-04-06 ENCOUNTER — Other Ambulatory Visit: Payer: Self-pay

## 2019-04-07 ENCOUNTER — Ambulatory Visit: Payer: HMO | Admitting: Family Medicine

## 2019-04-11 ENCOUNTER — Telehealth: Payer: Self-pay | Admitting: Family Medicine

## 2019-04-11 NOTE — Telephone Encounter (Signed)
Pt called and states that he is not urinating as much as he thinks he should be. He has not taken a "primer pill" in 2 weeks.  He goes to urinate about every 2-3 hours but it is only about 4-6 ounces. His legs are minimally swollen. He wanted to know if he needs to do something else?

## 2019-04-11 NOTE — Telephone Encounter (Signed)
Sounds like he may have prostate enlargement.  Can he come by Friday for an evaluation.  He may benefit from prostate medication

## 2019-04-12 NOTE — Telephone Encounter (Signed)
Pt aware and apt made 

## 2019-04-14 ENCOUNTER — Ambulatory Visit: Payer: HMO | Admitting: Family Medicine

## 2019-04-23 DIAGNOSIS — R0902 Hypoxemia: Secondary | ICD-10-CM | POA: Diagnosis not present

## 2019-04-23 DIAGNOSIS — I5042 Chronic combined systolic (congestive) and diastolic (congestive) heart failure: Secondary | ICD-10-CM | POA: Diagnosis not present

## 2019-04-27 ENCOUNTER — Other Ambulatory Visit: Payer: Self-pay

## 2019-04-28 ENCOUNTER — Ambulatory Visit (INDEPENDENT_AMBULATORY_CARE_PROVIDER_SITE_OTHER): Payer: HMO | Admitting: Family Medicine

## 2019-04-28 ENCOUNTER — Other Ambulatory Visit: Payer: Self-pay

## 2019-04-28 VITALS — BP 120/82 | HR 90 | Temp 98.4°F | Resp 20 | Ht 73.0 in | Wt 315.0 lb

## 2019-04-28 DIAGNOSIS — N183 Chronic kidney disease, stage 3 unspecified: Secondary | ICD-10-CM

## 2019-04-28 DIAGNOSIS — I83028 Varicose veins of left lower extremity with ulcer other part of lower leg: Secondary | ICD-10-CM | POA: Diagnosis not present

## 2019-04-28 DIAGNOSIS — R600 Localized edema: Secondary | ICD-10-CM | POA: Diagnosis not present

## 2019-04-28 DIAGNOSIS — R0902 Hypoxemia: Secondary | ICD-10-CM | POA: Diagnosis not present

## 2019-04-28 DIAGNOSIS — R2681 Unsteadiness on feet: Secondary | ICD-10-CM | POA: Diagnosis not present

## 2019-04-28 DIAGNOSIS — L97821 Non-pressure chronic ulcer of other part of left lower leg limited to breakdown of skin: Secondary | ICD-10-CM | POA: Diagnosis not present

## 2019-04-28 NOTE — Progress Notes (Signed)
Subjective:    Patient ID: William Alvarado, male    DOB: 1950-10-27, 68 y.o.   MRN: [id number]  HPI Patient is requesting a ramp for his home.  Patient suffers from obesity, severe COPD requiring oxygen.  Diastolic heart failure, A. fib, and chronic kidney disease.  He is progressively gotten weaker over the last few months.  He is now having to ambulate using a walker.  He is too large and heavy for his wife to help carry him down his steps.  He feels unsteady on his steps trying to walk down with a walker and has almost fallen several times.  Therefore he feels trapped in his home.  1 of his associates has told him that Medicare will pay for a wheelchair ramp if the doctor recommends it.  He also has pitting edema in both legs and is requesting that I wrap him with Unna boots today.  He has a blister forming on the posterior aspect of his left ankle.  He has 2 small bulla on the anterior aspect of his right shin.  However his breathing is better than his last visit.  He is not requiring oxygen and is satting 92% on room air.  His lungs are clear to auscultation bilaterally today.  He is still in normal sinus rhythm today as well. Past Medical History:  Diagnosis Date  . Acute respiratory failure with hypoxia (HCC)    Hospitalized with acute respiratory failure secondary to CAP, diastolic CHF, and pulmonary embolism  . AKI (acute kidney injury) (HCC)   . Allergy    allergic rhinitis  . Altered mental status   . Atrial fibrillation (HCC)   . Chronic anticoagulation 03/26/2017   Failed Coumadin (PE with therapeutic INR). Now on Xarelto  . Colon polyps   . Community acquired pneumonia of right lower lobe of lung (HCC)   . COPD (chronic obstructive pulmonary disease) (HCC)   . Degenerative disc disease, lumbar   . Diabetes mellitus without complication (HCC)   . History of pulmonary embolus (PE) 03/26/2017  . Hypertension   . Mitral regurgitation 12/26/2012  . Obesity (BMI 30-39.9) 12/09/2012  .  PAF (paroxysmal atrial fibrillation) (HCC)    DCCV in 2014 with recurrent PAF in setting of respiratory failure June 2018. S/P DCCV 04/13/17 to NSR  . Persistent atrial fibrillation   . Pneumonia ~ 2016  . Sepsis (HCC) 03/10/2017  . Sleep apnea   . Systolic murmur   . Type II diabetes mellitus (HCC)    Past Surgical History:  Procedure Laterality Date  . CARDIOVERSION N/A 01/09/2013   Procedure: CARDIOVERSION;  Surgeon: Pricilla RifflePaula V Ross, MD;  [facility];  Service: Cardiovascular;  Laterality: N/A;  . CARDIOVERSION N/A 04/13/2017   Procedure: CARDIOVERSION;  Surgeon: Quintella Reicherturner, Traci R, MD;  [facility];  Service: Cardiovascular;  Laterality: N/A;  . TEE WITHOUT CARDIOVERSION N/A 01/09/2013   Procedure: TRANSESOPHAGEAL ECHOCARDIOGRAM (TEE);  Surgeon: Pricilla RifflePaula V Ross, MD;  [facility];  Service: Cardiovascular;  Laterality: N/A;   Current Outpatient Medications on File Prior to Visit  Medication Sig Dispense Refill  . Dulaglutide (TRULICITY) 1.5 MG/0.5ML SOPN INJECT 1.5 MG INTO THE SKIN ONCE A WEEK 4 pen 3  . furosemide (LASIX) 40 MG tablet Take 2 tablets (80 mg total) by mouth 2 (two) times daily. 360 tablet 3  . metoprolol tartrate (LOPRESSOR) 50 MG tablet Take 1 tablet (50 mg total) by mouth 2 (two) times daily. 180 tablet 3  .  potassium chloride SA (K-DUR,KLOR-CON) 20 MEQ tablet Take 1 tablet (20 mEq total) by mouth daily. 90 tablet 3  . rivaroxaban (XARELTO) 20 MG TABS tablet TAKE 1 TABLET BY MOUTH ONCE DAILY WITH SUPPER 90 tablet 3  . sitaGLIPtin (JANUVIA) 100 MG tablet Take 1 tablet (100 mg total) by mouth daily. 90 tablet 2   No current facility-administered medications on file prior to visit.    No Known Allergies Social History   Socioeconomic History  . Marital status: Married    Spouse name: Not on file  . Number of children: 2  . Years of education: Not on file  . Highest education level: Not on file  Occupational History    Comment: Machinist   Social Needs  . Financial resource strain: Not on file  . Food insecurity    Worry: Never true    Inability: Never true  . Transportation needs    Medical: No    Non-medical: No  Tobacco Use  . Smoking status: Former Smoker    Packs/day: 1.00    Years: 43.00    Pack years: 43.00    Types: Cigarettes    Quit date: 03/08/2017    Years since quitting: 2.1  . Smokeless tobacco: Never Used  Substance and Sexual Activity  . Alcohol use: No    Comment: Rare  . Drug use: No  . Sexual activity: Not Currently  Lifestyle  . Physical activity    Days per week: Not on file    Minutes per session: Not on file  . Stress: Not on file  Relationships  . Social Musician on phone: Not on file    Gets together: Not on file    Attends religious service: Not on file    Active member of club or organization: Not on file    Attends meetings of clubs or organizations: Not on file    Relationship status: Not on file  . Intimate partner violence    Fear of current or ex partner: Not on file    Emotionally abused: Not on file    Physically abused: Not on file    Forced sexual activity: Not on file  Other Topics Concern  . Not on file  Social History Narrative  . Not on file      Review of Systems  All other systems reviewed and are negative.      Objective:   Physical Exam Vitals signs reviewed.  Constitutional:      Appearance: He is obese.  Cardiovascular:     Rate and Rhythm: Normal rate and regular rhythm.     Heart sounds: Normal heart sounds.  Pulmonary:     Effort: Pulmonary effort is normal.     Breath sounds: Normal breath sounds. No stridor. No wheezing, rhonchi or rales.  Musculoskeletal:     Right lower leg: Edema present.     Left lower leg: Edema present.  Neurological:     Mental Status: He is alert.           Assessment & Plan:  The primary encounter diagnosis was CKD (chronic kidney disease) stage 3, GFR 30-59 ml/min (HCC). Diagnoses of  Venous stasis ulcer of other part of left lower leg limited to breakdown of skin with varicose veins (HCC), Gait instability, and Hypoxia were also pertinent to this visit. Recently the patient's renal function deteriorated after I increase his diuretic.  I would like to repeat a BMP today to monitor his  renal function.  Due to the venous stasis ulcer forming on the posterior aspect of his left ankle, I placed the patient in an Unna boot today in both legs.  His daughter will come by and check on him Monday and remove the Unna boots as they have done before and wrap his leg with an Ace wrap.  Right now his wife is unable to do this because she is injured her arm.  They have been able to successfully manage his leg by applying compression wraps with Ace wraps up until now.  Given his COPD and hypoxia and gait instability he has becoming progressively more deconditioned and weak.  He is now unable to walk without using a walker and therefore he is unable to leave his home without significant effort.  He is almost fallen on several occasions.  I do believe he would benefit from an axis ramp to safely allow him to enter and exit his home.  I have written him a prescription for this and he can take it to any medical equipment provider if they will cover this with his insurance.  I explained to patient that I was not aware if insurance would cover this.  He will do some research and see if this is a covered form of equipment

## 2019-04-29 LAB — BASIC METABOLIC PANEL WITH GFR
BUN/Creatinine Ratio: 16 (calc) (ref 6–22)
BUN: 22 mg/dL (ref 7–25)
CO2: 33 mmol/L — ABNORMAL HIGH (ref 20–32)
Calcium: 9.4 mg/dL (ref 8.6–10.3)
Chloride: 90 mmol/L — ABNORMAL LOW (ref 98–110)
Creat: 1.39 mg/dL — ABNORMAL HIGH (ref 0.70–1.25)
GFR, Est African American: 61 mL/min/{1.73_m2} (ref >=60)
GFR, Est Non African American: 53 mL/min/{1.73_m2} — ABNORMAL LOW (ref >=60)
Glucose, Bld: 251 mg/dL — ABNORMAL HIGH (ref 65–99)
Potassium: 4.1 mmol/L (ref 3.5–5.3)
Sodium: 135 mmol/L (ref 135–146)

## 2019-05-02 ENCOUNTER — Other Ambulatory Visit: Payer: Self-pay

## 2019-05-02 DIAGNOSIS — I739 Peripheral vascular disease, unspecified: Secondary | ICD-10-CM

## 2019-05-07 ENCOUNTER — Encounter (HOSPITAL_COMMUNITY): Payer: Self-pay | Admitting: Emergency Medicine

## 2019-05-07 ENCOUNTER — Emergency Department (HOSPITAL_COMMUNITY)
Admission: EM | Admit: 2019-05-07 | Discharge: 2019-05-07 | Disposition: A | Discharge status: Home / Self Care | Payer: HMO | Attending: Emergency Medicine | Admitting: Emergency Medicine

## 2019-05-07 ENCOUNTER — Other Ambulatory Visit: Payer: Self-pay

## 2019-05-07 ENCOUNTER — Emergency Department (HOSPITAL_COMMUNITY): Payer: HMO

## 2019-05-07 DIAGNOSIS — Z79899 Other long term (current) drug therapy: Secondary | ICD-10-CM | POA: Insufficient documentation

## 2019-05-07 DIAGNOSIS — I509 Heart failure, unspecified: Secondary | ICD-10-CM | POA: Insufficient documentation

## 2019-05-07 DIAGNOSIS — R000 Tachycardia, unspecified: Secondary | ICD-10-CM | POA: Diagnosis not present

## 2019-05-07 DIAGNOSIS — E119 Type 2 diabetes mellitus without complications: Secondary | ICD-10-CM | POA: Diagnosis not present

## 2019-05-07 DIAGNOSIS — I83899 Varicose veins of unspecified lower extremities with other complications: Secondary | ICD-10-CM | POA: Diagnosis not present

## 2019-05-07 DIAGNOSIS — I110 Hypertensive heart disease with heart failure: Secondary | ICD-10-CM | POA: Diagnosis not present

## 2019-05-07 DIAGNOSIS — Z87891 Personal history of nicotine dependence: Secondary | ICD-10-CM | POA: Diagnosis not present

## 2019-05-07 DIAGNOSIS — I872 Venous insufficiency (chronic) (peripheral): Secondary | ICD-10-CM | POA: Diagnosis not present

## 2019-05-07 DIAGNOSIS — J449 Chronic obstructive pulmonary disease, unspecified: Secondary | ICD-10-CM | POA: Insufficient documentation

## 2019-05-07 DIAGNOSIS — I4891 Unspecified atrial fibrillation: Secondary | ICD-10-CM | POA: Diagnosis not present

## 2019-05-07 DIAGNOSIS — Z7901 Long term (current) use of anticoagulants: Secondary | ICD-10-CM | POA: Diagnosis not present

## 2019-05-07 DIAGNOSIS — R58 Hemorrhage, not elsewhere classified: Secondary | ICD-10-CM | POA: Diagnosis present

## 2019-05-07 DIAGNOSIS — Z7984 Long term (current) use of oral hypoglycemic drugs: Secondary | ICD-10-CM | POA: Diagnosis not present

## 2019-05-07 LAB — BASIC METABOLIC PANEL
Anion gap: 15 (ref 5–15)
BUN: 22 mg/dL (ref 8–23)
CO2: 33 mmol/L — ABNORMAL HIGH (ref 22–32)
Calcium: 8.8 mg/dL — ABNORMAL LOW (ref 8.9–10.3)
Chloride: 85 mmol/L — ABNORMAL LOW (ref 98–111)
Creatinine, Ser: 1.44 mg/dL — ABNORMAL HIGH (ref 0.61–1.24)
GFR calc Af Amer: 59 mL/min — ABNORMAL LOW (ref >60)
GFR calc non Af Amer: 51 mL/min — ABNORMAL LOW (ref >60)
Glucose, Bld: 179 mg/dL — ABNORMAL HIGH (ref 70–99)
Potassium: 3.5 mmol/L (ref 3.5–5.1)
Sodium: 133 mmol/L — ABNORMAL LOW (ref 135–145)

## 2019-05-07 LAB — CBC WITH DIFFERENTIAL/PLATELET
Abs Immature Granulocytes: 0.12 10*3/uL — ABNORMAL HIGH (ref 0.00–0.07)
Basophils Absolute: 0.1 10*3/uL (ref 0.0–0.1)
Basophils Relative: 1 %
Eosinophils Absolute: 0.2 10*3/uL (ref 0.0–0.5)
Eosinophils Relative: 2 %
HCT: 41.1 % (ref 39.0–52.0)
Hemoglobin: 12.6 g/dL — ABNORMAL LOW (ref 13.0–17.0)
Immature Granulocytes: 1 %
Lymphocytes Relative: 9 %
Lymphs Abs: 0.9 10*3/uL (ref 0.7–4.0)
MCH: 27.3 pg (ref 26.0–34.0)
MCHC: 30.7 g/dL (ref 30.0–36.0)
MCV: 89 fL (ref 80.0–100.0)
Monocytes Absolute: 1 10*3/uL (ref 0.1–1.0)
Monocytes Relative: 10 %
Neutro Abs: 7.6 10*3/uL (ref 1.7–7.7)
Neutrophils Relative %: 77 %
Platelets: 311 10*3/uL (ref 150–400)
RBC: 4.62 MIL/uL (ref 4.22–5.81)
RDW: 17.7 % — ABNORMAL HIGH (ref 11.5–15.5)
WBC: 9.8 10*3/uL (ref 4.0–10.5)
nRBC: 0 % (ref 0.0–0.2)

## 2019-05-07 LAB — BRAIN NATRIURETIC PEPTIDE: B Natriuretic Peptide: 166.3 pg/mL — ABNORMAL HIGH (ref 0.0–100.0)

## 2019-05-07 MED ORDER — METOPROLOL TARTRATE 5 MG/5ML IV SOLN
2.5000 mg | Freq: Once | INTRAVENOUS | Status: AC
  Administered 2019-05-07: 2.5 mg via INTRAVENOUS
  Filled 2019-05-07: qty 5

## 2019-05-07 MED ORDER — SODIUM CHLORIDE 0.9 % IV BOLUS
1000.0000 mL | Freq: Once | INTRAVENOUS | Status: AC
  Administered 2019-05-07: 1000 mL via INTRAVENOUS

## 2019-05-07 MED ORDER — LIDOCAINE HCL (PF) 1 % IJ SOLN
5.0000 mL | Freq: Once | INTRAMUSCULAR | Status: DC
  Filled 2019-05-07: qty 5

## 2019-05-07 NOTE — Discharge Instructions (Addendum)
Follow up in 9 days for removal of your stiches. If you have bleeding from your foot apply direct pressure to the wound and hold pressure without stopping for 20 minutes before you check the wound.  If the bleeding does not stay controlled or will not resolve after this please return to the emergency department.  You must elevate that leg and apply ice as well if it does bleed. Ordered home health for you.  This should be able to help clean the suture line and change your Unna boots. Follow-up with your primary care physician for removal of the sutures in about 9 or 10 days.  Turn to the emergency department if you develop any signs of infection including heat, redness, pus or discharge from the wound.

## 2019-05-07 NOTE — ED Notes (Signed)
Ortho tech called to place unna boot on pt

## 2019-05-07 NOTE — ED Provider Notes (Signed)
[facility] EMERGENCY DEPARTMENT Provider Note   CSN: [id number] Arrival date & time: 05/07/19  1235     History   Chief Complaint No chief complaint on file.   HPI William Alvarado is a 68 y.o. male who presents the emergency department with chief complaint of bleeding from his left foot.  He has a past medical history of acute hypoxic respiratory failure , Persistent atrial fibrillation, paroxysmal atrial fibrillation, morbid obesity, diabetes, COPD on chronic oxygen therapy, chronic anticoagulation on Xarelto, venous stasis dermatitis with associated ulceration.  Patient states that he was taking a shower this morning when suddenly he looked down and noticed that the tub was filled with blood and that he was bleeding from his left foot.  He tried to wash the foot with the showerhead however blood was spurting from the foot rapidly.  His wife called 911 and the patient was transported here to the emergency department for further evaluation with bandaging in place.  His wife states "I have never seen so much blood in my life."  Patient has varicosities on the lower extremity.  He is also wearing an Ace bandage around his right leg which he states is because he developed large bullae which are painful.  Both of his legs are significantly swollen at this time.  He states that he usually uses diuretic to remove fluid from his legs and that he and his wife have tried compression stockings but are not strong enough to get them on his legs.  The patient denies chest pain, shortness of breath, feelings of presyncope.    HPI  Past Medical History:  Diagnosis Date   Acute respiratory failure with hypoxia (HCC)    Hospitalized with acute respiratory failure secondary to CAP, diastolic CHF, and pulmonary embolism   AKI (acute kidney injury) (HCC)    Allergy    allergic rhinitis   Altered mental status    Atrial fibrillation (HCC)    Chronic anticoagulation 03/26/2017   Failed Coumadin (PE with therapeutic INR). Now on Xarelto   Colon polyps    Community acquired pneumonia of right lower lobe of lung (HCC)    COPD (chronic obstructive pulmonary disease) (HCC)    Degenerative disc disease, lumbar    Diabetes mellitus without complication (HCC)    History of pulmonary embolus (PE) 03/26/2017   Hypertension    Mitral regurgitation 12/26/2012   Obesity (BMI 30-39.9) 12/09/2012   PAF (paroxysmal atrial fibrillation) (HCC)    DCCV in 2014 with recurrent PAF in setting of respiratory failure June 2018. S/P DCCV 04/13/17 to NSR   Persistent atrial fibrillation    Pneumonia ~ 2016   Sepsis (HCC) 03/10/2017   Sleep apnea    Systolic murmur    Type II diabetes mellitus [facility])     Patient Active Problem List   Diagnosis Date Noted   PVD (peripheral vascular disease) (HCC) 03/21/2019   Venous stasis ulcer (HCC) 03/21/2019   Hypoxia 03/21/2019   OSA (obstructive sleep apnea) 03/21/2019   Gait instability 03/21/2019   CHF (congestive heart failure) (HCC) 03/21/2019   Shock (HCC)    Hypovolemia    Dehydration    Hypotension 12/09/2017   Persistent atrial fibrillation    Chronic anticoagulation 03/26/2017   History of pulmonary embolus (PE) 03/26/2017   Sepsis (HCC) 03/10/2017   Altered mental status    PAF (paroxysmal atrial fibrillation) (HCC)    Mitral regurgitation 12/26/2012   Morbid obesity (HCC) 12/09/2012   Hypertension  Allergy    Systolic murmur    Colon polyps    Diabetes mellitus without complication (HCC)    Degenerative disc disease, lumbar     Past Surgical History:  Procedure Laterality Date   CARDIOVERSION N/A 01/09/2013   Procedure: CARDIOVERSION;  Surgeon: Fay Records, MD;  Location: [facility];  Service: Cardiovascular;  Laterality: N/A;   CARDIOVERSION N/A 04/13/2017   Procedure: CARDIOVERSION;  Surgeon: Sueanne Margarita, MD;  Location: [place] ENDOSCOPY;  Service: Cardiovascular;  Laterality:  N/A;   TEE WITHOUT CARDIOVERSION N/A 01/09/2013   Procedure: TRANSESOPHAGEAL ECHOCARDIOGRAM (TEE);  Surgeon: Fay Records, MD;  Location: [facility] ENDOSCOPY;  Service: Cardiovascular;  Laterality: N/A;        Home Medications    Prior to Admission medications   Medication Sig Start Date End Date Taking? Authorizing Provider  Dulaglutide (TRULICITY) 1.5 TI/4.5YK SOPN INJECT 1.5 MG INTO THE SKIN ONCE A WEEK 12/05/18   [name], MD  furosemide (LASIX) 40 MG tablet Take 2 tablets (80 mg total) by mouth 2 (two) times daily. 11/23/18   [name], MD  metoprolol tartrate (LOPRESSOR) 50 MG tablet Take 1 tablet (50 mg total) by mouth 2 (two) times daily. 11/23/18   [name], MD  potassium chloride SA (K-DUR,KLOR-CON) 20 MEQ tablet Take 1 tablet (20 mEq total) by mouth daily. 07/21/18   [name], MD  rivaroxaban (XARELTO) 20 MG TABS tablet TAKE 1 TABLET BY MOUTH ONCE DAILY WITH SUPPER 11/16/18   [name], MD  sitaGLIPtin (JANUVIA) 100 MG tablet Take 1 tablet (100 mg total) by mouth daily. 11/23/18   [name], MD    Family History Family History  Problem Relation Age of Onset   Cancer Mother 58       breast   Heart disease Father 20       MI   Cancer Brother        lung   Stroke Brother 45    Social History Social History   Tobacco Use   Smoking status: Former Smoker    Packs/day: 1.00    Years: 43.00    Pack years: 43.00    Types: Cigarettes    Quit date: 03/08/2017    Years since quitting: 2.1   Smokeless tobacco: Never Used  Substance Use Topics   Alcohol use: No    Comment: Rare   Drug use: No     Allergies   Patient has no known allergies.   Review of Systems Review of Systems  Ten systems reviewed and are negative for acute change, except as noted in the HPI.   Physical Exam Updated Vital Signs BP 120/80 (BP Location: Right Arm)    Pulse (!) 112    Temp 98.1 F (36.7 C) (Oral)    Resp (!) 26    Ht 6\' 1"  (1.854 m)     Wt 136.1 kg    SpO2 99%    BMI 39.58 kg/m   Physical Exam Vitals signs and nursing note reviewed.  Constitutional:      General: He is not in acute distress.    Appearance: He is well-developed. He is morbidly obese. He is not toxic-appearing or diaphoretic.     Interventions: Nasal cannula in place.  HENT:     Head: Normocephalic and atraumatic.  Eyes:     General: No scleral icterus.    Extraocular Movements: Extraocular movements intact.     Conjunctiva/sclera: Conjunctivae normal.  Neck:  Musculoskeletal: Normal range of motion and neck supple.  Cardiovascular:     Rate and Rhythm: Normal rate and regular rhythm.     Heart sounds: Normal heart sounds.     Comments: Left lower extremity with varicosities over the left lateral dorsal foot.  No active bleeding at this time Pulmonary:     Effort: Pulmonary effort is normal. No respiratory distress.     Breath sounds: Normal breath sounds.  Abdominal:     Palpations: Abdomen is soft.     Tenderness: There is no abdominal tenderness.  Musculoskeletal:     Right lower leg: Edema present.     Left lower leg: Edema present.     Comments: Tense, bilateral pitting edema.  Right lower extremity with several large bullae which are open and draining but would like without evidence evidence of infection.  No surrounding erythema or streaking but tender to palpation.  Left lower extremity with large bullae posteriorly just above the Achilles.  Skin:    General: Skin is warm and dry.  Neurological:     Mental Status: He is alert.  Psychiatric:        Behavior: Behavior normal.      ED Treatments / Results  Labs (all labs ordered are listed, but only abnormal results are displayed) Labs Reviewed  CBC WITH DIFFERENTIAL/PLATELET - Abnormal; Notable for the following components:      Result Value   Hemoglobin 12.6 (*)    RDW 17.7 (*)    Abs Immature Granulocytes 0.12 (*)    All other components within normal limits  BASIC  METABOLIC PANEL - Abnormal; Notable for the following components:   Sodium 133 (*)    Chloride 85 (*)    CO2 33 (*)    Glucose, Bld 179 (*)    Creatinine, Ser 1.44 (*)    Calcium 8.8 (*)    GFR calc non Af Amer 51 (*)    GFR calc Af Amer 59 (*)    All other components within normal limits  BRAIN NATRIURETIC PEPTIDE    EKG None  Radiology No results found.  Procedures Bleeding Varicosity  Date/Time: 05/10/2019 8:59 AM Performed by: Arthor Captain, PA-C Authorized by: Arthor Captain, PA-C  Consent: Verbal consent obtained. Risks and benefits: risks, benefits and alternatives were discussed Consent given by: patient Patient understanding: patient states understanding of the procedure being performed Patient identity confirmed: provided demographic data and arm band Preparation: Patient was prepped and draped in the usual sterile fashion. Local anesthesia used: yes  Anesthesia: Local anesthesia used: yes Local Anesthetic: lidocaine 2% with epinephrine Anesthetic total: 6 mL  Sedation: Patient sedated: no  Patient tolerance: patient tolerated the procedure well with no immediate complications Comments: Partial hemostasis achieved with after vessel ties 3.0 prolene 3 figure of 8 sutures placed  Mild leaking from the puncture sights Combat gauze dressing applied    (including critical care time)  Medications Ordered in ED Medications  lidocaine (PF) (XYLOCAINE) 1 % injection 5 mL (has no administration in time range)  sodium chloride 0.9 % bolus 1,000 mL (has no administration in time range)     Initial Impression / Assessment and Plan / ED Course  I have reviewed the triage vital signs and the nursing notes.  Pertinent labs & imaging results that were available during my care of the patient were reviewed by me and considered in my medical decision making (see chart for details).     CC: bleeding , leg swelling  VS:  Vitals:   05/07/19 1304 05/07/19 1305  05/07/19 1515 05/07/19 1615  BP: 120/80  116/71 119/79  Pulse: (!) 112  (!) 113 100  Resp: (!) 26  (!) 22 18  Temp: 98.1 F (36.7 C)     TempSrc: Oral     SpO2: 99%  100% 100%  Weight:  136.1 kg    Height:  6\' 1"  (1.854 m)      UJ:WJXBJYNHX:History is gathered by Beatris Shippatien, emr and wife. DDX wound, lacertion, venous stasis swelling, chf exacerbation, anasarca, hypoalbunimeia,dvt Labs: I reviewed the labs which show slightly elevated BNP which is elevated from previous.  Mild hyponatremia, slightly elevated bicarb level which is likely due to the patient's chronic hypoxic respiratory failure.  Chronic renal insufficiency unsure insufficiency unchanged, elevated blood glucose.  Chronic normocytic anemia on CBC without evidence of elevated white count.   Imaging: I personally reviewed the images (portable 1 view chest x-ray) which show(s) no acute cardiopulmonary process, no evidence of edema EKG:  EKG Interpretation  Date/Time:  Sunday May 07 2019 12:54:10 EDT Ventricular Rate:  111 PR Interval:    QRS Duration: 92 QT Interval:  345 QTC Calculation: 471 R Axis:   79 Text Interpretation:  Atrial fibrillation Anterior infarct, old Borderline repolarization abnormality Confirmed by Vanetta MuldersZackowski, Scott [phone]([id number]) on 05/08/2019 3:09:20 PM       OZH:YQMVHQIDM:Patient here with bleeding varicosity, bilateral venous stasis with bullae and ulcerations.  He has tense swelling in the lower extremities this is complicated by his morbid obesity.  Although the patient had achieved hemostasis with stasis with compression of the vessel given his body habitus, tense lower extremity swelling I was very concerned that any direct pressure on the foot would cause the vessels to begin bleeding again therefore I applied the figure-of-eight sutures.  Patient's bleeding is also complicated by his Xarelto use and full hemostasis was difficult to achieve but ultimately was achieved using the sutures and a combat gauze pressure dressing.   The patient also was given Unna boots during his visit and I have also have ordered the patient home health for wound care management and changing of his unna boots. Patient also had persistent tachycardia and EKG shows A. fib with RVR.  He was given a low dose of Lopressor with improvement in his tachycardia he is asymptomatic and has a history of persistent tach A. fib.  Patient can follow closely with his cardiologist he is also on chronic anticoagulation.  Doubt DVT as the cause of this patient is he is equal swelling in the bilateral lower extremities and this is very likely more to do equal with his obesity, venous stasis issues and likely some component of heart failure although he does not appear to have any exacerbate any exacerbation of that today.  Patient will need to have his sutures removed in 9-10 days. Discussed return precautions and management of bleeding using direct pressure. Patient disposition: Discharge Patient condition: Good. The patient appears reasonably screened and/or stabilized for discharge and I doubt any other medical condition or other [facility] requiring further screening, evaluation, or treatment in the ED at this time prior to discharge. I have discussed lab and/or imaging findings with the patient and answered all questions/concerns to the best of my ability. I have discussed return precautions and OP follow up.      Patient seen in shared visit with attending physician. Who agrees with assessment, work up , treatment, and plan for discharge.  Final Clinical Impressions(s) / ED Diagnoses   Final diagnoses:  None    ED Discharge Orders    None       Arthor CaptainHarris, Deajah Erkkila, PA-C 05/10/19 0934    Lorre NickAllen, Anthony, MD 05/13/19 [phone]

## 2019-05-07 NOTE — ED Provider Notes (Signed)
Medical screening examination/treatment/procedure(s) were conducted as a shared visit with non-physician practitioner(s) and myself.  I personally evaluated the patient during the encounter.    68 year old male here with bleeding from his left foot.  Patient's exam significant for bleeding area on dorsum of his foot.  He also has discoloration to his toes which he says is chronic.  Stitch applied by physician assistant.  Will check blood work   Lacretia Leigh, MD 05/07/19 [phone]

## 2019-05-07 NOTE — ED Triage Notes (Signed)
Pt here from home. Pt states his left foot started bleeding uncontrollably. Pt states this is how his foot always look but he couldn't control the bleeding. Pt is on a blood thinner. Pt has a history of diabetes.

## 2019-05-07 NOTE — Progress Notes (Signed)
Orthopedic Tech Progress Note Patient Details:  William Alvarado [birth year] [id number]  Ortho Devices Type of Ortho Device: Haematologist Ortho Device/Splint Location: bilateral Ortho Device/Splint Interventions: Adjustment, Application, Ordered   Post Interventions Patient Tolerated: Well Instructions Provided: Care of device, Adjustment of device   [name] 05/07/2019, 3:51 PM

## 2019-05-09 ENCOUNTER — Encounter (HOSPITAL_COMMUNITY): Payer: HMO

## 2019-05-09 ENCOUNTER — Encounter: Payer: HMO | Admitting: Vascular Surgery

## 2019-05-10 ENCOUNTER — Other Ambulatory Visit: Payer: Self-pay

## 2019-05-10 NOTE — Patient Outreach (Signed)
  [facility]) Care Management Chronic Special Needs Program    05/10/2019  Name: William Alvarado, DOB: [birth year]  MRN: [id number]   [name] is enrolled in a chronic special needs plan for Diabetes. Called to follow up from ED visit on 05/07/19 for bleeding varicose vein in lt foot and requested medication assistance from [employer]). Client is to see his primary care provider on 05/11/19. States that his foot just started bleeding while he was in the shower.  States he can not remember if he hit his leg.  States they put some stiches in his foot and wrapped his leg with a dressing.  States he is to go see Dr.Pichard tomorrow to have his leg checked.  States that he several of his medications are very expensive now.  States his wife handles his medications and the pharmacist can call her about his drugs. Reinforced to keep his appt with his MD tomorrow  Discussed with client that [facility] pharmacist will call him or his wife about completing the medication assistance forms that they received earlier in the year. Plan to notify [employer] scientist (medical) of clients request for pharmacy assistance now Plan to follow up at next scheduled outreach in one month or sooner if needed  Peter Garter RN, Jackquline Denmark, [place] Management Coordinator [place] Management [phone]

## 2019-05-11 ENCOUNTER — Ambulatory Visit (INDEPENDENT_AMBULATORY_CARE_PROVIDER_SITE_OTHER): Payer: HMO | Admitting: Family Medicine

## 2019-05-11 ENCOUNTER — Other Ambulatory Visit: Payer: Self-pay

## 2019-05-11 ENCOUNTER — Encounter: Payer: Self-pay | Admitting: Family Medicine

## 2019-05-11 VITALS — BP 130/78 | HR 92 | Temp 98.3°F | Resp 22 | Ht 73.0 in | Wt 315.0 lb

## 2019-05-11 DIAGNOSIS — L97219 Non-pressure chronic ulcer of right calf with unspecified severity: Secondary | ICD-10-CM

## 2019-05-11 DIAGNOSIS — I83002 Varicose veins of unspecified lower extremity with ulcer of calf: Secondary | ICD-10-CM

## 2019-05-11 DIAGNOSIS — L97229 Non-pressure chronic ulcer of left calf with unspecified severity: Secondary | ICD-10-CM | POA: Diagnosis not present

## 2019-05-11 DIAGNOSIS — L97209 Non-pressure chronic ulcer of unspecified calf with unspecified severity: Secondary | ICD-10-CM

## 2019-05-11 NOTE — Progress Notes (Signed)
Subjective:    Patient ID: William Alvarado, male    DOB: 20-Jul-1951, 68 y.o.   MRN: [id number]  HPI Patient went to the emergency room on August 16.  A varicose vein on the dorsum of his left foot suddenly ruptured and was bleeding profusely.  Patient went to the emergency room and the wound was treated with 3 sutures.  This area is examined today and the area is not bleeding.  The skin seems to be healing normally.  There is no evidence of cellulitis or secondary infection.  Patient is in bilateral Unna boots.  These are removed today.  He has a large bulla on the medial posterior right shin.  This ruptured when I remove the Unna boot and a large golf ball sized clear jellylike coagulated fluid was removed.  A similar ulcer ruptured on his medial posterior right ankle.  This clotted serous-like fluid was roughly the size of a grape.  These 2 areas were covered with Silvadene, nonadherent gauze, and then the right leg was replaced in an Foot LockerUnna boot.  He has varicose veins all throughout both legs.  The Unna boot was removed from his left leg.  This revealed the stitches on the dorsum of his left foot.  There was no ulcers or weeping edema on his left leg.  His left leg was then placed in an [employer]ockerUnna boot as well.  There was also a small superficial ulcer on the superior anterior portion of his right shin that was roughly the diameter of a silver dollar Past Medical History:  Diagnosis Date  . Acute respiratory failure with hypoxia (HCC)    Hospitalized with acute respiratory failure secondary to CAP, diastolic CHF, and pulmonary embolism  . AKI (acute kidney injury) (HCC)   . Allergy    allergic rhinitis  . Altered mental status   . Atrial fibrillation (HCC)   . Chronic anticoagulation 03/26/2017   Failed Coumadin (PE with therapeutic INR). Now on Xarelto  . Colon polyps   . Community acquired pneumonia of right lower lobe of lung (HCC)   . COPD (chronic obstructive pulmonary disease) (HCC)   .  Degenerative disc disease, lumbar   . Diabetes mellitus without complication (HCC)   . History of pulmonary embolus (PE) 03/26/2017  . Hypertension   . Mitral regurgitation 12/26/2012  . Obesity (BMI 30-39.9) 12/09/2012  . PAF (paroxysmal atrial fibrillation) (HCC)    DCCV in 2014 with recurrent PAF in setting of respiratory failure June 2018. S/P DCCV 04/13/17 to NSR  . Persistent atrial fibrillation   . Pneumonia ~ 2016  . Sepsis (HCC) 03/10/2017  . Sleep apnea   . Systolic murmur   . Type II diabetes mellitus (HCC)    Past Surgical History:  Procedure Laterality Date  . CARDIOVERSION N/A 01/09/2013   Procedure: CARDIOVERSION;  Surgeon: Pricilla RifflePaula V Ross, MD;  [facility];  Service: Cardiovascular;  Laterality: N/A;  . CARDIOVERSION N/A 04/13/2017   Procedure: CARDIOVERSION;  Surgeon: Quintella Reicherturner, Traci R, MD;  [facility];  Service: Cardiovascular;  Laterality: N/A;  . TEE WITHOUT CARDIOVERSION N/A 01/09/2013   Procedure: TRANSESOPHAGEAL ECHOCARDIOGRAM (TEE);  Surgeon: Pricilla RifflePaula V Ross, MD;  [facility];  Service: Cardiovascular;  Laterality: N/A;   Current Outpatient Medications on File Prior to Visit  Medication Sig Dispense Refill  . Dulaglutide (TRULICITY) 1.5 MG/0.5ML SOPN INJECT 1.5 MG INTO THE SKIN ONCE A WEEK 4 pen 3  . furosemide (LASIX) 40 MG tablet Take 2 tablets (  80 mg total) by mouth 2 (two) times daily. 360 tablet 3  . metoprolol tartrate (LOPRESSOR) 50 MG tablet Take 1 tablet (50 mg total) by mouth 2 (two) times daily. 180 tablet 3  . potassium chloride SA (K-DUR,KLOR-CON) 20 MEQ tablet Take 1 tablet (20 mEq total) by mouth daily. 90 tablet 3  . rivaroxaban (XARELTO) 20 MG TABS tablet TAKE 1 TABLET BY MOUTH ONCE DAILY WITH SUPPER 90 tablet 3  . sitaGLIPtin (JANUVIA) 100 MG tablet Take 1 tablet (100 mg total) by mouth daily. 90 tablet 2   No current facility-administered medications on file prior to visit.    No Known Allergies Social History    Socioeconomic History  . Marital status: Married    Spouse name: Not on file  . Number of children: 2  . Years of education: Not on file  . Highest education level: Not on file  Occupational History    Comment: Machinist  Social Needs  . Financial resource strain: Not on file  . Food insecurity    Worry: Never true    Inability: Never true  . Transportation needs    Medical: No    Non-medical: No  Tobacco Use  . Smoking status: Former Smoker    Packs/day: 1.00    Years: 43.00    Pack years: 43.00    Types: Cigarettes    Quit date: 03/08/2017    Years since quitting: 2.1  . Smokeless tobacco: Never Used  Substance and Sexual Activity  . Alcohol use: No    Comment: Rare  . Drug use: No  . Sexual activity: Not Currently  Lifestyle  . Physical activity    Days per week: Not on file    Minutes per session: Not on file  . Stress: Not on file  Relationships  . Social Musician on phone: Not on file    Gets together: Not on file    Attends religious service: Not on file    Active member of club or organization: Not on file    Attends meetings of clubs or organizations: Not on file    Relationship status: Not on file  . Intimate partner violence    Fear of current or ex partner: Not on file    Emotionally abused: Not on file    Physically abused: Not on file    Forced sexual activity: Not on file  Other Topics Concern  . Not on file  Social History Narrative  . Not on file      Review of Systems  All other systems reviewed and are negative.      Objective:   Physical Exam Vitals signs reviewed.  Constitutional:      Appearance: He is obese.  Cardiovascular:     Rate and Rhythm: Normal rate and regular rhythm.     Heart sounds: Normal heart sounds.  Pulmonary:     Effort: Pulmonary effort is normal.     Breath sounds: Normal breath sounds. No stridor. No wheezing, rhonchi or rales.  Musculoskeletal:     Right lower leg: Edema present.      Left lower leg: Edema present.       Legs:  Neurological:     Mental Status: He is alert.           Assessment & Plan:  The encounter diagnosis was Venous stasis ulcer of calf, unspecified laterality, unspecified ulcer stage, unspecified whether varicose veins present (HCC). Patient has an appointment to  see the vascular surgeon given the varicose veins in his leg.  This was made for him by the emergency room.  In the meantime, I have recommended putting the patient back in Unna boots to facilitate the healing of the venous stasis ulcers seen in the right leg and demonstrated by the yellow circles on the drawing.  Each of the yellow circle areas was covered with Silvadene and nonadherent gauze and then the patient was put in an Unna boot from the toes to the knee.  Silvadene was applied to the suture location on the dorsum of the left foot and covered with nonadherent gauze and then placed in an Unna boot as well.  Patient was instructed to return Monday to change the Unna boot dressings.  Sutures will be removed on Monday.  I suspect that the wounds on the right leg will take several weeks if not 2 months to heal.  Patient will require compressive dressings throughout that time to allow healing

## 2019-05-15 ENCOUNTER — Ambulatory Visit (INDEPENDENT_AMBULATORY_CARE_PROVIDER_SITE_OTHER): Payer: HMO | Admitting: Family Medicine

## 2019-05-15 ENCOUNTER — Encounter: Payer: Self-pay | Admitting: Family Medicine

## 2019-05-15 ENCOUNTER — Other Ambulatory Visit: Payer: Self-pay

## 2019-05-15 VITALS — BP 138/86 | HR 96 | Temp 98.1°F | Resp 20 | Ht 73.0 in | Wt 325.0 lb

## 2019-05-15 DIAGNOSIS — L97209 Non-pressure chronic ulcer of unspecified calf with unspecified severity: Secondary | ICD-10-CM | POA: Diagnosis not present

## 2019-05-15 DIAGNOSIS — I83002 Varicose veins of unspecified lower extremity with ulcer of calf: Secondary | ICD-10-CM

## 2019-05-15 DIAGNOSIS — I83028 Varicose veins of left lower extremity with ulcer other part of lower leg: Secondary | ICD-10-CM | POA: Diagnosis not present

## 2019-05-15 DIAGNOSIS — L97821 Non-pressure chronic ulcer of other part of left lower leg limited to breakdown of skin: Secondary | ICD-10-CM | POA: Diagnosis not present

## 2019-05-15 NOTE — Progress Notes (Signed)
Subjective:    Patient ID: William Alvarado, male    DOB: [birth year], 68 y.o.   MRN: [id number]  HPI  05/11/19 Patient went to the emergency room on August 16.  A varicose vein on the dorsum of his left foot suddenly ruptured and was bleeding profusely.  Patient went to the emergency room and the wound was treated with 3 sutures.  This area is examined today and the area is not bleeding.  The skin seems to be healing normally.  There is no evidence of cellulitis or secondary infection.  Patient is in bilateral Unna boots.  These are removed today.  He has a large bulla on the medial posterior right shin.  This ruptured when I remove the Unna boot and a large golf ball sized clear jellylike coagulated fluid was removed.  A similar ulcer ruptured on his medial posterior right ankle.  This clotted serous-like fluid was roughly the size of a grape.  These 2 areas were covered with Silvadene, nonadherent gauze, and then the right leg was replaced in an [employer].  He has varicose veins all throughout both legs.  The Unna boot was removed from his left leg.  This revealed the stitches on the dorsum of his left foot.  There was no ulcers or weeping edema on his left leg.  His left leg was then placed in an [employer] as well.  There was also a small superficial ulcer on the superior anterior portion of his right shin that was roughly the diameter of a silver dollar.  At that time, my plan was: Patient has an appointment to see the vascular surgeon given the varicose veins in his leg.  This was made for him by the emergency room.  In the meantime, I have recommended putting the patient back in Unna boots to facilitate the healing of the venous stasis ulcers seen in the right leg and demonstrated by the yellow circles on the drawing.  Each of the yellow circle areas was covered with Silvadene and nonadherent gauze and then the patient was put in an Unna boot from the toes to the knee.  Silvadene was applied to the  suture location on the dorsum of the left foot and covered with nonadherent gauze and then placed in an Unna boot as well.  Patient was instructed to return Monday to change the Unna boot dressings.  Sutures will be removed on Monday.  I suspect that the wounds on the right leg will take several weeks if not 2 months to heal.  Patient will require compressive dressings throughout that time to allow healing  05/15/19     Unna boot was removed from his right leg.  As the pictures above show, there is still a triangular-shaped ulcer over his medial shin.  This is shown in the second picture above.  There is also a triangular-shaped ulcer over his superior anterior lateral shin.  Both of these have improved dramatically since his last visit.  The swelling in his legs have also improved.  The 3 stitches were removed from the dorsum of his left foot.  He began to bleed from each of the suture locations.  This wound was then covered with Silvadene, nonadherent gauze, and the patient was placed back in an Unna boot in his left leg.  I believe that the pressure will stop the bleeding.  The 2 ulcers shown above in the photos were covered with Silvadene, nonadherent gauze, and the patient was placed  back in an Unna boot in his right leg.  His weight is up substantially. Wt Readings from Last 3 Encounters:  05/15/19 (!) 325 lb (147.4 kg)  05/11/19 (!) 315 lb (142.9 kg)  05/07/19 300 lb (136.1 kg)   He has bibasilar crackles in both lungs and also swelling above the Unna boots in both legs.  Patient denies any chest pain or shortness of breath.  He does admit to eating more recently. Past Medical History:  Diagnosis Date  . Acute respiratory failure with hypoxia (HCC)    Hospitalized with acute respiratory failure secondary to CAP, diastolic CHF, and pulmonary embolism  . AKI (acute kidney injury) (HCC)   . Allergy    allergic rhinitis  . Altered mental status   . Atrial fibrillation (HCC)   . Chronic  anticoagulation 03/26/2017   Failed Coumadin (PE with therapeutic INR). Now on Xarelto  . Colon polyps   . Community acquired pneumonia of right lower lobe of lung (HCC)   . COPD (chronic obstructive pulmonary disease) (HCC)   . Degenerative disc disease, lumbar   . Diabetes mellitus without complication (HCC)   . History of pulmonary embolus (PE) 03/26/2017  . Hypertension   . Mitral regurgitation 12/26/2012  . Obesity (BMI 30-39.9) 12/09/2012  . PAF (paroxysmal atrial fibrillation) (HCC)    DCCV in 2014 with recurrent PAF in setting of respiratory failure June 2018. S/P DCCV 04/13/17 to NSR  . Persistent atrial fibrillation   . Pneumonia ~ 2016  . Sepsis (HCC) 03/10/2017  . Sleep apnea   . Systolic murmur   . Type II diabetes mellitus (HCC)    Past Surgical History:  Procedure Laterality Date  . CARDIOVERSION N/A 01/09/2013   Procedure: CARDIOVERSION;  Surgeon: Pricilla RifflePaula V Ross, MD;  [facility];  Service: Cardiovascular;  Laterality: N/A;  . CARDIOVERSION N/A 04/13/2017   Procedure: CARDIOVERSION;  Surgeon: Quintella Reicherturner, Traci R, MD;  [facility];  Service: Cardiovascular;  Laterality: N/A;  . TEE WITHOUT CARDIOVERSION N/A 01/09/2013   Procedure: TRANSESOPHAGEAL ECHOCARDIOGRAM (TEE);  Surgeon: Pricilla RifflePaula V Ross, MD;  [facility];  Service: Cardiovascular;  Laterality: N/A;   Current Outpatient Medications on File Prior to Visit  Medication Sig Dispense Refill  . Dulaglutide (TRULICITY) 1.5 MG/0.5ML SOPN INJECT 1.5 MG INTO THE SKIN ONCE A WEEK 4 pen 3  . furosemide (LASIX) 40 MG tablet Take 2 tablets (80 mg total) by mouth 2 (two) times daily. 360 tablet 3  . metoprolol tartrate (LOPRESSOR) 50 MG tablet Take 1 tablet (50 mg total) by mouth 2 (two) times daily. 180 tablet 3  . potassium chloride SA (K-DUR,KLOR-CON) 20 MEQ tablet Take 1 tablet (20 mEq total) by mouth daily. 90 tablet 3  . rivaroxaban (XARELTO) 20 MG TABS tablet TAKE 1 TABLET BY MOUTH ONCE DAILY WITH SUPPER 90  tablet 3  . sitaGLIPtin (JANUVIA) 100 MG tablet Take 1 tablet (100 mg total) by mouth daily. 90 tablet 2   No current facility-administered medications on file prior to visit.    No Known Allergies Social History   Socioeconomic History  . Marital status: Married    Spouse name: Not on file  . Number of children: 2  . Years of education: Not on file  . Highest education level: Not on file  Occupational History    Comment: Machinist  Social Needs  . Financial resource strain: Not on file  . Food insecurity    Worry: Never true    Inability: Never  true  . Transportation needs    Medical: No    Non-medical: No  Tobacco Use  . Smoking status: Former Smoker    Packs/day: 1.00    Years: 43.00    Pack years: 43.00    Types: Cigarettes    Quit date: 03/08/2017    Years since quitting: 2.1  . Smokeless tobacco: Never Used  Substance and Sexual Activity  . Alcohol use: No    Comment: Rare  . Drug use: No  . Sexual activity: Not Currently  Lifestyle  . Physical activity    Days per week: Not on file    Minutes per session: Not on file  . Stress: Not on file  Relationships  . Social Herbalist on phone: Not on file    Gets together: Not on file    Attends religious service: Not on file    Active member of club or organization: Not on file    Attends meetings of clubs or organizations: Not on file    Relationship status: Not on file  . Intimate partner violence    Fear of current or ex partner: Not on file    Emotionally abused: Not on file    Physically abused: Not on file    Forced sexual activity: Not on file  Other Topics Concern  . Not on file  Social History Narrative  . Not on file      Review of Systems  All other systems reviewed and are negative.      Objective:   Physical Exam Vitals signs reviewed.  Constitutional:      Appearance: He is obese.  Cardiovascular:     Rate and Rhythm: Normal rate and regular rhythm.     Heart sounds:  Normal heart sounds.  Pulmonary:     Effort: Pulmonary effort is normal.     Breath sounds: Normal breath sounds. No stridor. No wheezing, rhonchi or rales.  Musculoskeletal:     Right lower leg: Edema present.     Left lower leg: Edema present.       Legs:  Neurological:     Mental Status: He is alert.           Assessment & Plan:  The primary encounter diagnosis was Venous stasis ulcer of calf, unspecified laterality, unspecified ulcer stage, unspecified whether varicose veins present ([place]). A diagnosis of Venous stasis ulcer of other part of left lower leg limited to breakdown of skin with varicose veins (HCC) was also pertinent to this visit. Please see the history of present illness, the patient was placed in Unna boots bilaterally.  I will see the patient back on Thursday and change the dressings until healed.  I did recommend that the patient use Zaroxolyn tomorrow morning prior to his Lasix to facilitate increased diuresis as he appears to be fluid overloaded on exam.  Recheck on Thursday along with a BMP.

## 2019-05-16 ENCOUNTER — Other Ambulatory Visit: Payer: Self-pay | Admitting: Pharmacist

## 2019-05-16 ENCOUNTER — Ambulatory Visit: Payer: Self-pay | Admitting: Pharmacist

## 2019-05-17 ENCOUNTER — Other Ambulatory Visit: Payer: Self-pay

## 2019-05-18 ENCOUNTER — Other Ambulatory Visit: Payer: Self-pay | Admitting: Pharmacy Technician

## 2019-05-18 ENCOUNTER — Encounter: Payer: Self-pay | Admitting: Family Medicine

## 2019-05-18 ENCOUNTER — Ambulatory Visit (INDEPENDENT_AMBULATORY_CARE_PROVIDER_SITE_OTHER): Payer: HMO | Admitting: Family Medicine

## 2019-05-18 VITALS — BP 130/76 | HR 90 | Temp 98.1°F | Resp 16 | Ht 73.0 in | Wt 316.0 lb

## 2019-05-18 DIAGNOSIS — I83012 Varicose veins of right lower extremity with ulcer of calf: Secondary | ICD-10-CM

## 2019-05-18 DIAGNOSIS — I83022 Varicose veins of left lower extremity with ulcer of calf: Secondary | ICD-10-CM | POA: Diagnosis not present

## 2019-05-18 DIAGNOSIS — I83002 Varicose veins of unspecified lower extremity with ulcer of calf: Secondary | ICD-10-CM

## 2019-05-18 DIAGNOSIS — L97209 Non-pressure chronic ulcer of unspecified calf with unspecified severity: Secondary | ICD-10-CM

## 2019-05-18 NOTE — Patient Outreach (Addendum)
[place] [facility]) Care Management  [place]   05/18/2019  AZAREL BANNER 12-11-1950 [id number]   Reason for referral: Medication Assistance with DM medications & HTA-CSNP med review  Referral source: [facility] RN Current insurance:[employer]  PMHx includes but not limited to:  HTN, Afib, DMT2, diastolicCHF (LVEF 41-42%)  Outreach:  Successful telephone call with Mr. and Mrs. Bellucci.  HIPAA identifiers verified.  Patient's wife is very involved with care and aware of patient's medications. His medications have been affordable thus far, however patient is now in the coverage gap and would like to apply for medication assistance.  Patient is agreeable to participate in patient assistance programs through [place] ([employer]) and [employer] (Januvia).  Medications updated in Epic.  Patient states reports 100% compliance with medications.  His last A1c has increased from 6.9 (2019) to 8.5% (11/2018).  He states his FBG usually are within goal range, however his FBG has been running higher around 150-200. Encouraged patient to adhere to diabetic friendly diet and continue taking medications as prescribed.  He has not been able to afford Trulicity, therefore he has not been compliant.  His last Trulicity fill was in April 2020.  Medication assistance can likely make an impact on his diabetes management.  Unclear why patient is not on statin therapy.  Will address.   Objective: The 10-year ASCVD risk score Mikey Bussing DC Jr., et al., 2013) is: 27.5%   Values used to calculate the score:     Age: 68 years     Sex: Male     Is Non-Hispanic African American: No     Diabetic: Yes     Tobacco smoker: No     Systolic Blood Pressure: 395 mmHg     Is BP treated: Yes     HDL Cholesterol: 34 mg/dL     Total Cholesterol: 132 mg/dL  Lab Results  Component Value Date   CREATININE 1.44 (H) 05/07/2019   CREATININE 1.39 (H) 04/28/2019   CREATININE 1.77 (H) 03/30/2019    Lab Results   Component Value Date   HGBA1C 8.5 (H) 12/05/2018    Lipid Panel     Component Value Date/Time   CHOL 132 12/05/2018 0823   TRIG 98 12/05/2018 0823   HDL 34 (L) 12/05/2018 0823   CHOLHDL 3.9 12/05/2018 0823   VLDL 17 01/22/2017 0831   LDLCALC 80 12/05/2018 0823    BP Readings from Last 3 Encounters:  05/18/19 130/76  05/15/19 138/86  05/11/19 130/78    No Known Allergies  Medications Reviewed Today    Reviewed by Six, Eden Lathe, LPN (Licensed Practical Nurse) on 05/18/19 at 16  Med List Status: <None>  Medication Order Taking? Sig Documenting Provider Last Dose Status Informant  Dulaglutide (TRULICITY) 1.5 VU/0.2BX SOPN [id number] Yes INJECT 1.5 MG INTO THE SKIN ONCE A WEEK Pickard, Cammie Mcgee, MD Taking Active   furosemide (LASIX) 40 MG tablet [id number] Yes Take 2 tablets (80 mg total) by mouth 2 (two) times daily. [name], MD Taking Active   metoprolol tartrate (LOPRESSOR) 50 MG tablet [id number] Yes Take 1 tablet (50 mg total) by mouth 2 (two) times daily. [name], MD Taking Active   potassium chloride SA (K-DUR,KLOR-CON) 20 MEQ tablet [id number] Yes Take 1 tablet (20 mEq total) by mouth daily. [name], MD Taking Active   rivaroxaban (XARELTO) 20 MG TABS tablet [id number] Yes TAKE 1 TABLET BY MOUTH ONCE DAILY WITH SUPPER [name], MD Taking Active  sitaGLIPtin (JANUVIA) 100 MG tablet [id number] Yes Take 1 tablet (100 mg total) by mouth daily. [name], MD Taking Active           Medication Assistance Findings:    Patient Assistance Programs:  Trulicity   prescriber Dr. Juluis Pitch  manufacturer Estill Batten   prescriber Dr. Juluis Pitch  manufacturer Merck  Xarelto   prescriber Dr. Juluis Pitch  manufacturer [employer] o Income requirement met: Yes o Out-of-pocket prescription expenditure met:   Yes - Patient has met application requirements to apply for this program.   o   Plan: . I will route patient  assistance letter to [employer] technician who will coordinate patient assistance program application process for medications listed above.  [facility] pharmacy technician will assist with obtaining all required documents from both patient and provider(s) and submit application(s) once completed.   . I will follow up with patient in 2-3 months.   Regina Eck, PharmD, [place]  [phone]

## 2019-05-18 NOTE — Patient Outreach (Signed)
[place] [facility]) Care Management  05/18/2019  MANTAJ CHAMBERLIN 1950/12/02 [id number]                                       Medication Assistance Referral  Referral From: [facility] RPh Jenne Pane.  Medication/Company: Danelle Berry / [employer] Patient application portion:  Mailed Provider application portion: Faxed  to Dr. Dennard Schaumann Provider address/fax verified via: Office website  Medication/Company: [employer]veda Reasons / [employer]  Patient application portion:  Mailed Provider application portion: Faxed  to Dr. Dennard Schaumann Provider address/fax verified via: Office website  Medication/Company: Celesta Gentile / [employer] Patient application portion:  Mailed Provider application portion: Interoffice Mailed to Dr. Dennard Schaumann Provider address/fax verified via: Office website  Follow up:  Will follow up with patient in 7-10 business days to confirm application(s) have been received.  Maud Deed Chana Bode [place] Certified Pharmacy Technician [place] Management Direct [phone]

## 2019-05-18 NOTE — Progress Notes (Signed)
Subjective:    Patient ID: William Alvarado, male    DOB: July 10, 1951, 68 y.o.   MRN: [id number]  HPI  05/11/19 Patient went to the emergency room on August 16.  A varicose vein on the dorsum of his left foot suddenly ruptured and was bleeding profusely.  Patient went to the emergency room and the wound was treated with 3 sutures.  This area is examined today and the area is not bleeding.  The skin seems to be healing normally.  There is no evidence of cellulitis or secondary infection.  Patient is in bilateral Unna boots.  These are removed today.  He has a large bulla on the medial posterior right shin.  This ruptured when I remove the Unna boot and a large golf ball sized clear jellylike coagulated fluid was removed.  A similar ulcer ruptured on his medial posterior right ankle.  This clotted serous-like fluid was roughly the size of a grape.  These 2 areas were covered with Silvadene, nonadherent gauze, and then the right leg was replaced in an [employer].  He has varicose veins all throughout both legs.  The Unna boot was removed from his left leg.  This revealed the stitches on the dorsum of his left foot.  There was no ulcers or weeping edema on his left leg.  His left leg was then placed in an [employer] as well.  There was also a small superficial ulcer on the superior anterior portion of his right shin that was roughly the diameter of a silver dollar.  At that time, my plan was: Patient has an appointment to see the vascular surgeon given the varicose veins in his leg.  This was made for him by the emergency room.  In the meantime, I have recommended putting the patient back in Unna boots to facilitate the healing of the venous stasis ulcers seen in the right leg and demonstrated by the yellow circles on the drawing.  Each of the yellow circle areas was covered with Silvadene and nonadherent gauze and then the patient was put in an Unna boot from the toes to the knee.  Silvadene was applied to the  suture location on the dorsum of the left foot and covered with nonadherent gauze and then placed in an Unna boot as well.  Patient was instructed to return Monday to change the Unna boot dressings.  Sutures will be removed on Monday.  I suspect that the wounds on the right leg will take several weeks if not 2 months to heal.  Patient will require compressive dressings throughout that time to allow healing  05/15/19     Unna boot was removed from his right leg.  As the pictures above show, there is still a triangular-shaped ulcer over his medial shin.  This is shown in the second picture above.  There is also a triangular-shaped ulcer over his superior anterior lateral shin.  Both of these have improved dramatically since his last visit.  The swelling in his legs have also improved.  The 3 stitches were removed from the dorsum of his left foot.  He began to bleed from each of the suture locations.  This wound was then covered with Silvadene, nonadherent gauze, and the patient was placed back in an Unna boot in his left leg.  I believe that the pressure will stop the bleeding.  The 2 ulcers shown above in the photos were covered with Silvadene, nonadherent gauze, and the patient was placed  back in an Unna boot in his right leg.  His weight is up substantially. Wt Readings from Last 3 Encounters:  05/18/19 (!) 316 lb (143.3 kg)  05/15/19 (!) 325 lb (147.4 kg)  05/11/19 (!) 315 lb (142.9 kg)   He has bibasilar crackles in both lungs and also swelling above the Unna boots in both legs.  Patient denies any chest pain or shortness of breath.  He does admit to eating more recently.  At that time, my plan was: Please see the history of present illness, the patient was placed in Unna boots bilaterally.  I will see the patient back on Thursday and change the dressings until healed.  I did recommend that the patient use Zaroxolyn tomorrow morning prior to his Lasix to facilitate increased diuresis as he appears to  be fluid overloaded on exam.  Recheck on Thursday along with a BMP.  05/18/19 Patient has lost 9 pounds since I last saw him.  His breathing is better and the swelling in his legs has completely subsided.  His Unna boots were removed today.  The ulcer on the anterior right shin at the superior portion just below the patella has essentially healed.  The ulcer over the medial calf which was the largest is roughly 80% healed.  There is no evidence of secondary cellulitis.  The ulcer on his posterior right ankle is still about 50% healed.  Although better this is healing slower than the rest.  The Unna boot was removed from his left leg and there was no residual bleeding from where the sutures were removed.  The wound has healed well. Past Medical History:  Diagnosis Date   Acute respiratory failure with hypoxia [facility])    Hospitalized with acute respiratory failure secondary to CAP, diastolic CHF, and pulmonary embolism   AKI (acute kidney injury) (HCC)    Allergy    allergic rhinitis   Altered mental status    Atrial fibrillation (HCC)    Chronic anticoagulation 03/26/2017   Failed Coumadin (PE with therapeutic INR). Now on Xarelto   Colon polyps    Community acquired pneumonia of right lower lobe of lung (HCC)    COPD (chronic obstructive pulmonary disease) (HCC)    Degenerative disc disease, lumbar    Diabetes mellitus without complication (HCC)    History of pulmonary embolus (PE) 03/26/2017   Hypertension    Mitral regurgitation 12/26/2012   Obesity (BMI 30-39.9) 12/09/2012   PAF (paroxysmal atrial fibrillation) (HCC)    DCCV in 2014 with recurrent PAF in setting of respiratory failure June 2018. S/P DCCV 04/13/17 to NSR   Persistent atrial fibrillation    Pneumonia ~ 2016   Sepsis (HCC) 03/10/2017   Sleep apnea    Systolic murmur    Type II diabetes mellitus (HCC)    Past Surgical History:  Procedure Laterality Date   CARDIOVERSION N/A 01/09/2013   Procedure:  CARDIOVERSION;  Surgeon: Pricilla Riffle, MD;  Location: [facility] ENDOSCOPY;  Service: Cardiovascular;  Laterality: N/A;   CARDIOVERSION N/A 04/13/2017   Procedure: CARDIOVERSION;  Surgeon: Quintella Reichert, MD;  Location: [facility] ENDOSCOPY;  Service: Cardiovascular;  Laterality: N/A;   TEE WITHOUT CARDIOVERSION N/A 01/09/2013   Procedure: TRANSESOPHAGEAL ECHOCARDIOGRAM (TEE);  Surgeon: Pricilla Riffle, MD;  Location: [facility] ENDOSCOPY;  Service: Cardiovascular;  Laterality: N/A;   Current Outpatient Medications on File Prior to Visit  Medication Sig Dispense Refill   Dulaglutide (TRULICITY) 1.5 MG/0.5ML SOPN INJECT 1.5 MG INTO THE SKIN ONCE A WEEK  4 pen 3   furosemide (LASIX) 40 MG tablet Take 2 tablets (80 mg total) by mouth 2 (two) times daily. 360 tablet 3   metoprolol tartrate (LOPRESSOR) 50 MG tablet Take 1 tablet (50 mg total) by mouth 2 (two) times daily. 180 tablet 3   potassium chloride SA (K-DUR,KLOR-CON) 20 MEQ tablet Take 1 tablet (20 mEq total) by mouth daily. 90 tablet 3   rivaroxaban (XARELTO) 20 MG TABS tablet TAKE 1 TABLET BY MOUTH ONCE DAILY WITH SUPPER 90 tablet 3   sitaGLIPtin (JANUVIA) 100 MG tablet Take 1 tablet (100 mg total) by mouth daily. 90 tablet 2   No current facility-administered medications on file prior to visit.    No Known Allergies Social History   Socioeconomic History   Marital status: Married    Spouse name: Not on file   Number of children: 2   Years of education: Not on file   Highest education level: Not on file  Occupational History    Comment: Metallurgist strain: Not on file   Food insecurity    Worry: Never true    Inability: Never true   Transportation needs    Medical: No    Non-medical: No  Tobacco Use   Smoking status: Former Smoker    Packs/day: 1.00    Years: 43.00    Pack years: 43.00    Types: Cigarettes    Quit date: 03/08/2017    Years since quitting: 2.1   Smokeless tobacco: Never Used    Substance and Sexual Activity   Alcohol use: No    Comment: Rare   Drug use: No   Sexual activity: Not Currently  Lifestyle   Physical activity    Days per week: Not on file    Minutes per session: Not on file   Stress: Not on file  Relationships   Social connections    Talks on phone: Not on file    Gets together: Not on file    Attends religious service: Not on file    Active member of club or organization: Not on file    Attends meetings of clubs or organizations: Not on file    Relationship status: Not on file   Intimate partner violence    Fear of current or ex partner: Not on file    Emotionally abused: Not on file    Physically abused: Not on file    Forced sexual activity: Not on file  Other Topics Concern   Not on file  Social History Narrative   Not on file      Review of Systems  All other systems reviewed and are negative.      Objective:   Physical Exam Vitals signs reviewed.  Constitutional:      Appearance: He is obese.  Cardiovascular:     Rate and Rhythm: Normal rate and regular rhythm.     Heart sounds: Normal heart sounds.  Pulmonary:     Effort: Pulmonary effort is normal.     Breath sounds: Normal breath sounds. No stridor. No wheezing, rhonchi or rales.  Musculoskeletal:     Right lower leg: He exhibits deformity. No edema.     Left lower leg: No edema.       Legs:  Neurological:     Mental Status: He is alert.           Assessment & Plan:  The encounter diagnosis was Venous stasis ulcer of calf,  unspecified laterality, unspecified ulcer stage, unspecified whether varicose veins present (HCC). Wound has completely healed on the left foot.  Wounds are healing nicely on the right leg.  Unna boots were replaced bilaterally.  Recheck here Monday.  If he continues his current pace, I suspect that he will be resolved within 1 to 2 weeks.

## 2019-05-22 ENCOUNTER — Encounter: Payer: Self-pay | Admitting: Family Medicine

## 2019-05-22 ENCOUNTER — Ambulatory Visit (INDEPENDENT_AMBULATORY_CARE_PROVIDER_SITE_OTHER): Payer: HMO | Admitting: Family Medicine

## 2019-05-22 VITALS — BP 118/70 | HR 96 | Temp 98.7°F | Resp 22 | Ht 73.0 in | Wt 319.0 lb

## 2019-05-22 DIAGNOSIS — L97209 Non-pressure chronic ulcer of unspecified calf with unspecified severity: Secondary | ICD-10-CM

## 2019-05-22 DIAGNOSIS — I83002 Varicose veins of unspecified lower extremity with ulcer of calf: Secondary | ICD-10-CM | POA: Diagnosis not present

## 2019-05-22 NOTE — Progress Notes (Signed)
Subjective:    Patient ID: William Alvarado, male    DOB: [birth year], 68 y.o.   MRN: [id number]  HPI  05/11/19 Patient went to the emergency room on August 16.  A varicose vein on the dorsum of his left foot suddenly ruptured and was bleeding profusely.  Patient went to the emergency room and the wound was treated with 3 sutures.  This area is examined today and the area is not bleeding.  The skin seems to be healing normally.  There is no evidence of cellulitis or secondary infection.  Patient is in bilateral Unna boots.  These are removed today.  He has a large bulla on the medial posterior right shin.  This ruptured when I remove the Unna boot and a large golf ball sized clear jellylike coagulated fluid was removed.  A similar ulcer ruptured on his medial posterior right ankle.  This clotted serous-like fluid was roughly the size of a grape.  These 2 areas were covered with Silvadene, nonadherent gauze, and then the right leg was replaced in an [employer].  He has varicose veins all throughout both legs.  The Unna boot was removed from his left leg.  This revealed the stitches on the dorsum of his left foot.  There was no ulcers or weeping edema on his left leg.  His left leg was then placed in an [employer] as well.  There was also a small superficial ulcer on the superior anterior portion of his right shin that was roughly the diameter of a silver dollar.  At that time, my plan was: Patient has an appointment to see the vascular surgeon given the varicose veins in his leg.  This was made for him by the emergency room.  In the meantime, I have recommended putting the patient back in Unna boots to facilitate the healing of the venous stasis ulcers seen in the right leg and demonstrated by the yellow circles on the drawing.  Each of the yellow circle areas was covered with Silvadene and nonadherent gauze and then the patient was put in an Unna boot from the toes to the knee.  Silvadene was applied to the  suture location on the dorsum of the left foot and covered with nonadherent gauze and then placed in an Unna boot as well.  Patient was instructed to return Monday to change the Unna boot dressings.  Sutures will be removed on Monday.  I suspect that the wounds on the right leg will take several weeks if not 2 months to heal.  Patient will require compressive dressings throughout that time to allow healing  05/15/19     Unna boot was removed from his right leg.  As the pictures above show, there is still a triangular-shaped ulcer over his medial shin.  This is shown in the second picture above.  There is also a triangular-shaped ulcer over his superior anterior lateral shin.  Both of these have improved dramatically since his last visit.  The swelling in his legs have also improved.  The 3 stitches were removed from the dorsum of his left foot.  He began to bleed from each of the suture locations.  This wound was then covered with Silvadene, nonadherent gauze, and the patient was placed back in an Unna boot in his left leg.  I believe that the pressure will stop the bleeding.  The 2 ulcers shown above in the photos were covered with Silvadene, nonadherent gauze, and the patient was placed  back in an Unna boot in his right leg.  His weight is up substantially. Wt Readings from Last 3 Encounters:  05/22/19 (!) 319 lb (144.7 kg)  05/18/19 (!) 316 lb (143.3 kg)  05/15/19 (!) 325 lb (147.4 kg)   He has bibasilar crackles in both lungs and also swelling above the Unna boots in both legs.  Patient denies any chest pain or shortness of breath.  He does admit to eating more recently.  At that time, my plan was: Please see the history of present illness, the patient was placed in Unna boots bilaterally.  I will see the patient back on Thursday and change the dressings until healed.  I did recommend that the patient use Zaroxolyn tomorrow morning prior to his Lasix to facilitate increased diuresis as he appears to  be fluid overloaded on exam.  Recheck on Thursday along with a BMP.  05/18/19 Patient has lost 9 pounds since I last saw him.  His breathing is better and the swelling in his legs has completely subsided.  His Unna boots were removed today.  The ulcer on the anterior right shin at the superior portion just below the patella has essentially healed.  The ulcer over the medial calf which was the largest is roughly 80% healed.  There is no evidence of secondary cellulitis.  The ulcer on his posterior right ankle is still about 50% healed.  Although better this is healing slower than the rest.  The Unna boot was removed from his left leg and there was no residual bleeding from where the sutures were removed.  The wound has healed well.  AT that time, my plan was: Wound has completely healed on the left foot.  Wounds are healing nicely on the right leg.  Unna boots were replaced bilaterally.  Recheck here Monday.  If he continues his current pace, I suspect that he will be resolved within 1 to 2 weeks.  05/22/19 The left foot has completely healed after the Unna boot was removed.  The Unna boot was removed from the right leg and the ulcer on the anterior right shin and also on the medial anterior right calf and shin have completely healed and re-epithelialized.  These look excellent.  He still has a very superficial ulcer on the posterior aspect of his right ankle just above the lateral malleolus.  This is healing nicely and has now almost completely filled in although it is yet to re-epithelialize. Past Medical History:  Diagnosis Date  . Acute respiratory failure with hypoxia (HCC)    Hospitalized with acute respiratory failure secondary to CAP, diastolic CHF, and pulmonary embolism  . AKI (acute kidney injury) (HCC)   . Allergy    allergic rhinitis  . Altered mental status   . Atrial fibrillation (HCC)   . Chronic anticoagulation 03/26/2017   Failed Coumadin (PE with therapeutic INR). Now on Xarelto  .  Colon polyps   . Community acquired pneumonia of right lower lobe of lung (HCC)   . COPD (chronic obstructive pulmonary disease) (HCC)   . Degenerative disc disease, lumbar   . Diabetes mellitus without complication (HCC)   . History of pulmonary embolus (PE) 03/26/2017  . Hypertension   . Mitral regurgitation 12/26/2012  . Obesity (BMI 30-39.9) 12/09/2012  . PAF (paroxysmal atrial fibrillation) (HCC)    DCCV in 2014 with recurrent PAF in setting of respiratory failure June 2018. S/P DCCV 04/13/17 to NSR  . Persistent atrial fibrillation   . Pneumonia ~  2016  . Sepsis (HCC) 03/10/2017  . Sleep apnea   . Systolic murmur   . Type II diabetes mellitus (HCC)    Past Surgical History:  Procedure Laterality Date  . CARDIOVERSION N/A 01/09/2013   Procedure: CARDIOVERSION;  Surgeon: Pricilla RifflePaula V Ross, MD;  [facility];  Service: Cardiovascular;  Laterality: N/A;  . CARDIOVERSION N/A 04/13/2017   Procedure: CARDIOVERSION;  Surgeon: Quintella Reicherturner, Traci R, MD;  [facility];  Service: Cardiovascular;  Laterality: N/A;  . TEE WITHOUT CARDIOVERSION N/A 01/09/2013   Procedure: TRANSESOPHAGEAL ECHOCARDIOGRAM (TEE);  Surgeon: Pricilla RifflePaula V Ross, MD;  [facility];  Service: Cardiovascular;  Laterality: N/A;   Current Outpatient Medications on File Prior to Visit  Medication Sig Dispense Refill  . Dulaglutide (TRULICITY) 1.5 MG/0.5ML SOPN INJECT 1.5 MG INTO THE SKIN ONCE A WEEK 4 pen 3  . furosemide (LASIX) 40 MG tablet Take 2 tablets (80 mg total) by mouth 2 (two) times daily. 360 tablet 3  . metoprolol tartrate (LOPRESSOR) 50 MG tablet Take 1 tablet (50 mg total) by mouth 2 (two) times daily. 180 tablet 3  . potassium chloride SA (K-DUR,KLOR-CON) 20 MEQ tablet Take 1 tablet (20 mEq total) by mouth daily. 90 tablet 3  . rivaroxaban (XARELTO) 20 MG TABS tablet TAKE 1 TABLET BY MOUTH ONCE DAILY WITH SUPPER 90 tablet 3  . sitaGLIPtin (JANUVIA) 100 MG tablet Take 1 tablet (100 mg total) by mouth  daily. 90 tablet 2   No current facility-administered medications on file prior to visit.    No Known Allergies Social History   Socioeconomic History  . Marital status: Married    Spouse name: Not on file  . Number of children: 2  . Years of education: Not on file  . Highest education level: Not on file  Occupational History    Comment: Machinist  Social Needs  . Financial resource strain: Not on file  . Food insecurity    Worry: Never true    Inability: Never true  . Transportation needs    Medical: No    Non-medical: No  Tobacco Use  . Smoking status: Former Smoker    Packs/day: 1.00    Years: 43.00    Pack years: 43.00    Types: Cigarettes    Quit date: 03/08/2017    Years since quitting: 2.2  . Smokeless tobacco: Never Used  Substance and Sexual Activity  . Alcohol use: No    Comment: Rare  . Drug use: No  . Sexual activity: Not Currently  Lifestyle  . Physical activity    Days per week: Not on file    Minutes per session: Not on file  . Stress: Not on file  Relationships  . Social Musicianconnections    Talks on phone: Not on file    Gets together: Not on file    Attends religious service: Not on file    Active member of club or organization: Not on file    Attends meetings of clubs or organizations: Not on file    Relationship status: Not on file  . Intimate partner violence    Fear of current or ex partner: Not on file    Emotionally abused: Not on file    Physically abused: Not on file    Forced sexual activity: Not on file  Other Topics Concern  . Not on file  Social History Narrative  . Not on file      Review of Systems  All other systems  reviewed and are negative.      Objective:   Physical Exam Vitals signs reviewed.  Constitutional:      Appearance: He is obese.  Cardiovascular:     Rate and Rhythm: Normal rate and regular rhythm.     Heart sounds: Normal heart sounds.  Pulmonary:     Effort: Pulmonary effort is normal.     Breath  sounds: Normal breath sounds. No stridor. No wheezing, rhonchi or rales.  Musculoskeletal:     Right lower leg: He exhibits deformity. No edema.     Left lower leg: No edema.       Legs:  Neurological:     Mental Status: He is alert.           Assessment & Plan:  The encounter diagnosis was Venous stasis ulcer of calf, unspecified laterality, unspecified ulcer stage, unspecified whether varicose veins present (HCC). Unna boot was replaced only on the right leg.  I recommended the patient get knee-high compression hose 15 to 20 mmHg and begin wearing them on the left lower leg.  He will return Thursday and we will remove the Unna boot from his right leg and if that wound has healed, I would recommend the patient wear compression hose on that side as well to prevent this from occurring in the future and help manage his chronic venous insufficiency.

## 2019-05-24 DIAGNOSIS — I5042 Chronic combined systolic (congestive) and diastolic (congestive) heart failure: Secondary | ICD-10-CM | POA: Diagnosis not present

## 2019-05-24 DIAGNOSIS — R0902 Hypoxemia: Secondary | ICD-10-CM | POA: Diagnosis not present

## 2019-05-25 ENCOUNTER — Other Ambulatory Visit: Payer: Self-pay

## 2019-05-25 ENCOUNTER — Ambulatory Visit (INDEPENDENT_AMBULATORY_CARE_PROVIDER_SITE_OTHER): Payer: HMO | Admitting: Family Medicine

## 2019-05-25 VITALS — BP 122/68 | HR 94 | Temp 98.2°F | Resp 20 | Ht 73.0 in | Wt 324.0 lb

## 2019-05-25 DIAGNOSIS — L97209 Non-pressure chronic ulcer of unspecified calf with unspecified severity: Secondary | ICD-10-CM

## 2019-05-25 DIAGNOSIS — Z23 Encounter for immunization: Secondary | ICD-10-CM | POA: Diagnosis not present

## 2019-05-25 DIAGNOSIS — I83002 Varicose veins of unspecified lower extremity with ulcer of calf: Secondary | ICD-10-CM

## 2019-05-25 NOTE — Progress Notes (Signed)
Subjective:    Patient ID: William Alvarado, male    DOB: 06-24-1951, 68 y.o.   MRN: [id number]  HPI  05/11/19 Patient went to the emergency room on August 16.  A varicose vein on the dorsum of his left foot suddenly ruptured and was bleeding profusely.  Patient went to the emergency room and the wound was treated with 3 sutures.  This area is examined today and the area is not bleeding.  The skin seems to be healing normally.  There is no evidence of cellulitis or secondary infection.  Patient is in bilateral Unna boots.  These are removed today.  He has a large bulla on the medial posterior right shin.  This ruptured when I remove the Unna boot and a large golf ball sized clear jellylike coagulated fluid was removed.  A similar ulcer ruptured on his medial posterior right ankle.  This clotted serous-like fluid was roughly the size of a grape.  These 2 areas were covered with Silvadene, nonadherent gauze, and then the right leg was replaced in an [employer].  He has varicose veins all throughout both legs.  The Unna boot was removed from his left leg.  This revealed the stitches on the dorsum of his left foot.  There was no ulcers or weeping edema on his left leg.  His left leg was then placed in an [employer] as well.  There was also a small superficial ulcer on the superior anterior portion of his right shin that was roughly the diameter of a silver dollar.  At that time, my plan was: Patient has an appointment to see the vascular surgeon given the varicose veins in his leg.  This was made for him by the emergency room.  In the meantime, I have recommended putting the patient back in Unna boots to facilitate the healing of the venous stasis ulcers seen in the right leg and demonstrated by the yellow circles on the drawing.  Each of the yellow circle areas was covered with Silvadene and nonadherent gauze and then the patient was put in an Unna boot from the toes to the knee.  Silvadene was applied to the  suture location on the dorsum of the left foot and covered with nonadherent gauze and then placed in an Unna boot as well.  Patient was instructed to return Monday to change the Unna boot dressings.  Sutures will be removed on Monday.  I suspect that the wounds on the right leg will take several weeks if not 2 months to heal.  Patient will require compressive dressings throughout that time to allow healing  05/15/19     Unna boot was removed from his right leg.  As the pictures above show, there is still a triangular-shaped ulcer over his medial shin.  This is shown in the second picture above.  There is also a triangular-shaped ulcer over his superior anterior lateral shin.  Both of these have improved dramatically since his last visit.  The swelling in his legs have also improved.  The 3 stitches were removed from the dorsum of his left foot.  He began to bleed from each of the suture locations.  This wound was then covered with Silvadene, nonadherent gauze, and the patient was placed back in an Unna boot in his left leg.  I believe that the pressure will stop the bleeding.  The 2 ulcers shown above in the photos were covered with Silvadene, nonadherent gauze, and the patient was placed  back in an Unna boot in his right leg.  His weight is up substantially. Wt Readings from Last 3 Encounters:  05/25/19 (!) 324 lb (147 kg)  05/22/19 (!) 319 lb (144.7 kg)  05/18/19 (!) 316 lb (143.3 kg)   He has bibasilar crackles in both lungs and also swelling above the Unna boots in both legs.  Patient denies any chest pain or shortness of breath.  He does admit to eating more recently.  At that time, my plan was: Please see the history of present illness, the patient was placed in Unna boots bilaterally.  I will see the patient back on Thursday and change the dressings until healed.  I did recommend that the patient use Zaroxolyn tomorrow morning prior to his Lasix to facilitate increased diuresis as he appears to  be fluid overloaded on exam.  Recheck on Thursday along with a BMP.  05/18/19 Patient has lost 9 pounds since I last saw him.  His breathing is better and the swelling in his legs has completely subsided.  His Unna boots were removed today.  The ulcer on the anterior right shin at the superior portion just below the patella has essentially healed.  The ulcer over the medial calf which was the largest is roughly 80% healed.  There is no evidence of secondary cellulitis.  The ulcer on his posterior right ankle is still about 50% healed.  Although better this is healing slower than the rest.  The Unna boot was removed from his left leg and there was no residual bleeding from where the sutures were removed.  The wound has healed well.  AT that time, my plan was: Wound has completely healed on the left foot.  Wounds are healing nicely on the right leg.  Unna boots were replaced bilaterally.  Recheck here Monday.  If he continues his current pace, I suspect that he will be resolved within 1 to 2 weeks.  05/22/19 The left foot has completely healed after the Unna boot was removed.  The Unna boot was removed from the right leg and the ulcer on the anterior right shin and also on the medial anterior right calf and shin have completely healed and re-epithelialized.  These look excellent.  He still has a very superficial ulcer on the posterior aspect of his right ankle just above the lateral malleolus.  This is healing nicely and has now almost completely filled in although it is yet to re-epithelialize.  05/25/19 Unna boot was removed from his right leg today.  The ulcer on his posterior right calf is completely healed.  There is no residual wound.  There is no evidence of secondary cellulitis.  There is no pitting edema.  The patient did bring his compression hose today. Past Medical History:  Diagnosis Date   Acute respiratory failure with hypoxia [facility](HCC)    Hospitalized with acute respiratory failure secondary to  CAP, diastolic CHF, and pulmonary embolism   AKI (acute kidney injury) (HCC)    Allergy    allergic rhinitis   Altered mental status    Atrial fibrillation (HCC)    Chronic anticoagulation 03/26/2017   Failed Coumadin (PE with therapeutic INR). Now on Xarelto   Colon polyps    Community acquired pneumonia of right lower lobe of lung (HCC)    COPD (chronic obstructive pulmonary disease) (HCC)    Degenerative disc disease, lumbar    Diabetes mellitus without complication (HCC)    History of pulmonary embolus (PE) 03/26/2017  Hypertension    Mitral regurgitation 12/26/2012   Obesity (BMI 30-39.9) 12/09/2012   PAF (paroxysmal atrial fibrillation) (HCC)    DCCV in 2014 with recurrent PAF in setting of respiratory failure June 2018. S/P DCCV 04/13/17 to NSR   Persistent atrial fibrillation    Pneumonia ~ 2016   Sepsis (HCC) 03/10/2017   Sleep apnea    Systolic murmur    Type II diabetes mellitus (HCC)    Past Surgical History:  Procedure Laterality Date   CARDIOVERSION N/A 01/09/2013   Procedure: CARDIOVERSION;  Surgeon: Pricilla RifflePaula V Ross, MD;  [facility];  Service: Cardiovascular;  Laterality: N/A;   CARDIOVERSION N/A 04/13/2017   Procedure: CARDIOVERSION;  Surgeon: Quintella Reicherturner, Traci R, MD;  Location: [facility];  Service: Cardiovascular;  Laterality: N/A;   TEE WITHOUT CARDIOVERSION N/A 01/09/2013   Procedure: TRANSESOPHAGEAL ECHOCARDIOGRAM (TEE);  Surgeon: Pricilla RifflePaula V Ross, MD;  [facility];  Service: Cardiovascular;  Laterality: N/A;   Current Outpatient Medications on File Prior to Visit  Medication Sig Dispense Refill   Dulaglutide (TRULICITY) 1.5 MG/0.5ML SOPN INJECT 1.5 MG INTO THE SKIN ONCE A WEEK 4 pen 3   furosemide (LASIX) 40 MG tablet Take 2 tablets (80 mg total) by mouth 2 (two) times daily. 360 tablet 3   metoprolol tartrate (LOPRESSOR) 50 MG tablet Take 1 tablet (50 mg total) by mouth 2 (two) times daily. 180 tablet 3   potassium chloride  SA (K-DUR,KLOR-CON) 20 MEQ tablet Take 1 tablet (20 mEq total) by mouth daily. 90 tablet 3   rivaroxaban (XARELTO) 20 MG TABS tablet TAKE 1 TABLET BY MOUTH ONCE DAILY WITH SUPPER 90 tablet 3   sitaGLIPtin (JANUVIA) 100 MG tablet Take 1 tablet (100 mg total) by mouth daily. 90 tablet 2   No current facility-administered medications on file prior to visit.    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  Social Needs   Financial resource strain: Not on file   Food insecurity    Worry: Never true    Inability: Never true   Transportation needs    Medical: No    Non-medical: No  Tobacco Use   Smoking status: Former Smoker    Packs/day: 1.00    Years: 43.00    Pack years: 43.00    Types: Cigarettes    Quit date: 03/08/2017    Years since quitting: 2.2   Smokeless tobacco: Never Used  Substance and Sexual Activity   Alcohol use: No    Comment: Rare   Drug use: No   Sexual activity: Not Currently  Lifestyle   Physical activity    Days per week: Not on file    Minutes per session: Not on file   Stress: Not on file  Relationships   Social connections    Talks on phone: Not on file    Gets together: Not on file    Attends religious service: Not on file    Active member of club or organization: Not on file    Attends meetings of clubs or organizations: Not on file    Relationship status: Not on file   Intimate partner violence    Fear of current or ex partner: Not on file    Emotionally  abused: Not on file    Physically abused: Not on file    Forced sexual activity: Not on file  Other Topics Concern   Not on file  Social History Narrative   Not on file      Review of Systems  All other systems reviewed and are negative.      Objective:    Physical Exam Vitals signs reviewed.  Constitutional:      Appearance: He is obese.  Cardiovascular:     Rate and Rhythm: Normal rate and regular rhythm.     Heart sounds: Normal heart sounds.  Pulmonary:     Effort: Pulmonary effort is normal.     Breath sounds: Normal breath sounds. No stridor. No wheezing, rhonchi or rales.  Musculoskeletal:     Right lower leg: He exhibits no tenderness, no swelling and no deformity. No edema.     Left lower leg: No edema.  Neurological:     Mental Status: He is alert.           Assessment & Plan:   Venous stasis ulcer of calf, unspecified laterality, unspecified ulcer stage, unspecified whether varicose veins present (HCC)  Need for immunization against influenza - Plan: Flu Vaccine QUAD High Dose(Fluad)  Ulcer has completely healed on the posterior right calf.  Unna boot was removed today from the right leg.  The wound was cleaned and the patient was placed in his personal compression hose.  I recommended that he wear the compression hose on a daily basis.  Follow-up in November for fasting lab work or sooner if other problems arise.  Recommended compliance with compression hose to prevent leg swelling and ulcer formation in the future.

## 2019-06-01 ENCOUNTER — Ambulatory Visit: Payer: HMO | Admitting: Cardiology

## 2019-06-08 ENCOUNTER — Other Ambulatory Visit: Payer: Self-pay

## 2019-06-08 NOTE — Patient Outreach (Signed)
[facility]) Care Management Chronic Special Needs Program  06/08/2019  Name: William Alvarado DOB: 1951-05-28  MRN: [id number]  [name] is enrolled in a chronic special needs plan for Diabetes. Reviewed and updated care plan.  Subjective: Client states that he has had a problem with his leg swelling and having sores that he has been going to the doctor to have dressed.  States they are healed now and he is wearing special hose to help keep them healed.  States he is trying to follow a low salt diabetes diet better.  States he is no longer eating Pop Tarts for breakfast and is trying to eat cereal or oatmeal instead.  Denies any recent falls.  States his wife filled out the papers to get help with the cost of his medications.  States his blood sugars have been good but he can not remember his numbers other than his reading was 95 this morning and his weight was 320 this morning  Goals Addressed            This Visit's Progress   . COMPLETED:  Acknowledge receipt of Advanced Directive package       Received forms    . Advanced Care Planning complete by next 9 months(continue 06/08/19)   On track   . Client understands the importance of follow-up with providers by attending scheduled visits   On track   . Client will report no worsening of symptoms related to heart disease within the next 9 months (continue 06/08/19)   On track   . Client will use Assistive Devices as needed and verbalize understanding of device use   On track   . Client will verbalize knowledge of self management of Hypertension as evidences by BP reading of 140/90 or less; or as defined by provider   On track   . Client will verbalize understanding of treatment plan for impaired skin integrity and follow up with provider by next 6 months   On track   . Client/Caregiver will verbalize understanding of instructions related to self-care and safety   On track   . COMPLETED: HEMOGLOBIN A1C < 7      Continue  Diabetes self management actions:  Glucose monitoring per provider recommendations  Eat Healthy  Check feet daily  Visit provider every 3-6 months as directed  Hbg A1C level every 3-6 months.  Eye Exam yearly    . Maintain timely refills of diabetic medication as prescribed within the year .   On track   . COMPLETED: Obtain Annual Foot Exam       Completed 03/02/2019    . Obtain Hemoglobin A1C at least 2 times per year   On track   . Visit Primary Care Provider or Endocrinologist at least 2 times per year    On track    Client is not meeting diabetes self management goal of hemoglobin A1C of <7% with last reading of 8.5% Client/wife now working with Pharmacy for pharmacy assistance for medications. Praised for making changes in his breakfast and reinforced to follow a low CHO, low sodium diet Instructed to wear his compression hose daily and to keep legs elevated a much as possible Reinforced s/s to call MD for weight gain and swelling in his legs Reviewed number for 24-hour nurse Line  Plan:  Send successful outreach letter with a copy of their individualized care plan and Send individual care plan to provider  Chronic care management coordinator will outreach in:  3 Months     Peter Garter RN, [facility], [place] Management [phone]

## 2019-06-19 ENCOUNTER — Telehealth (HOSPITAL_COMMUNITY): Payer: Self-pay

## 2019-06-19 NOTE — Telephone Encounter (Signed)

## 2019-06-20 ENCOUNTER — Other Ambulatory Visit: Payer: Self-pay

## 2019-06-20 ENCOUNTER — Ambulatory Visit (HOSPITAL_COMMUNITY)
Admission: RE | Admit: 2019-06-20 | Discharge: 2019-06-20 | Disposition: A | Discharge status: Home / Self Care | Payer: HMO | Source: Ambulatory Visit | Attending: Vascular Surgery | Admitting: Vascular Surgery

## 2019-06-20 ENCOUNTER — Ambulatory Visit (INDEPENDENT_AMBULATORY_CARE_PROVIDER_SITE_OTHER): Payer: HMO | Admitting: Vascular Surgery

## 2019-06-20 ENCOUNTER — Encounter: Payer: Self-pay | Admitting: Vascular Surgery

## 2019-06-20 DIAGNOSIS — I739 Peripheral vascular disease, unspecified: Secondary | ICD-10-CM | POA: Diagnosis not present

## 2019-06-20 DIAGNOSIS — I872 Venous insufficiency (chronic) (peripheral): Secondary | ICD-10-CM

## 2019-06-20 NOTE — Progress Notes (Signed)
Patient name: William Alvarado MRN: [id number] DOB: 1951/08/09 Sex: male  REASON FOR CONSULT: Evaluate PVD  HPI: William Alvarado is a 68 y.o. male, with multiple medical problems including diabetes, A. fib on chronic anticoagulation, hypertension, COPD on home oxygen that presents for evaluation of PVD.  Patient is here with his wife and she states that his feet have been discolored for several years.  He's had issues with wounds on his bilateral shins and has been in and out of Unna boots.  She states Dr. Tanya Nones has been organizing this and most recently his wounds were completely healed when he came out of the Unna boots and was placed in knee-high compression.  Since that time his legs have started breaking down again and now has open ulcerations on both lower extremities.  He's had no previous vein interventions.  States that his main issue is leg swelling.  Both of his legs were equally swollen all the time.  He is on Lasix to help with this.  States his feet can be difficult to walk on given the swelling.  Past Medical History:  Diagnosis Date  . Acute respiratory failure with hypoxia (HCC)    Hospitalized with acute respiratory failure secondary to CAP, diastolic CHF, and pulmonary embolism  . AKI (acute kidney injury) (HCC)   . Allergy    allergic rhinitis  . Altered mental status   . Atrial fibrillation (HCC)   . Chronic anticoagulation 03/26/2017   Failed Coumadin (PE with therapeutic INR). Now on Xarelto  . Colon polyps   . Community acquired pneumonia of right lower lobe of lung (HCC)   . COPD (chronic obstructive pulmonary disease) (HCC)   . Degenerative disc disease, lumbar   . Diabetes mellitus without complication (HCC)   . History of pulmonary embolus (PE) 03/26/2017  . Hypertension   . Mitral regurgitation 12/26/2012  . Obesity (BMI 30-39.9) 12/09/2012  . PAF (paroxysmal atrial fibrillation) (HCC)    DCCV in 2014 with recurrent PAF in setting of respiratory failure June  2018. S/P DCCV 04/13/17 to NSR  . Persistent atrial fibrillation   . Pneumonia ~ 2016  . Sepsis (HCC) 03/10/2017  . Sleep apnea   . Systolic murmur   . Type II diabetes mellitus (HCC)     Past Surgical History:  Procedure Laterality Date  . CARDIOVERSION N/A 01/09/2013   Procedure: CARDIOVERSION;  Surgeon: Pricilla Riffle, MD;  Location: [facility] ENDOSCOPY;  Service: Cardiovascular;  Laterality: N/A;  . CARDIOVERSION N/A 04/13/2017   Procedure: CARDIOVERSION;  Surgeon: Quintella Reichert, MD;  Location: [facility] ENDOSCOPY;  Service: Cardiovascular;  Laterality: N/A;  . TEE WITHOUT CARDIOVERSION N/A 01/09/2013   Procedure: TRANSESOPHAGEAL ECHOCARDIOGRAM (TEE);  Surgeon: Pricilla Riffle, MD;  Location: [facility] ENDOSCOPY;  Service: Cardiovascular;  Laterality: N/A;    Family History  Problem Relation Age of Onset  . Cancer Mother 29       breast  . Heart disease Father 80       MI  . Cancer Brother        lung  . Stroke Brother 27    SOCIAL HISTORY: Social History   Socioeconomic History  . Marital status: Married    Spouse name: Not on file  . Number of children: 2  . Years of education: Not on file  . Highest education level: Not on file  Occupational History    Comment: Machinist  Social Needs  . Financial resource strain: Not on file  .  Food insecurity    Worry: Never true    Inability: Never true  . Transportation needs    Medical: No    Non-medical: No  Tobacco Use  . Smoking status: Former Smoker    Packs/day: 1.00    Years: 43.00    Pack years: 43.00    Types: Cigarettes    Quit date: 03/08/2017    Years since quitting: 2.2  . Smokeless tobacco: Never Used  Substance and Sexual Activity  . Alcohol use: No    Comment: Rare  . Drug use: No  . Sexual activity: Not Currently  Lifestyle  . Physical activity    Days per week: Not on file    Minutes per session: Not on file  . Stress: Not on file  Relationships  . Social Herbalist on phone: Not on file    Gets  together: Not on file    Attends religious service: Not on file    Active member of club or organization: Not on file    Attends meetings of clubs or organizations: Not on file    Relationship status: Not on file  . Intimate partner violence    Fear of current or ex partner: Not on file    Emotionally abused: Not on file    Physically abused: Not on file    Forced sexual activity: Not on file  Other Topics Concern  . Not on file  Social History Narrative  . Not on file    No Known Allergies  Current Outpatient Medications  Medication Sig Dispense Refill  . Dulaglutide (TRULICITY) 1.5 ZL/9.3TT SOPN INJECT 1.5 MG INTO THE SKIN ONCE A WEEK 4 pen 3  . furosemide (LASIX) 40 MG tablet Take 2 tablets (80 mg total) by mouth 2 (two) times daily. 360 tablet 3  . metoprolol tartrate (LOPRESSOR) 50 MG tablet Take 1 tablet (50 mg total) by mouth 2 (two) times daily. 180 tablet 3  . potassium chloride SA (K-DUR,KLOR-CON) 20 MEQ tablet Take 1 tablet (20 mEq total) by mouth daily. 90 tablet 3  . rivaroxaban (XARELTO) 20 MG TABS tablet TAKE 1 TABLET BY MOUTH ONCE DAILY WITH SUPPER 90 tablet 3  . sitaGLIPtin (JANUVIA) 100 MG tablet Take 1 tablet (100 mg total) by mouth daily. 90 tablet 2   No current facility-administered medications for this visit.     REVIEW OF SYSTEMS:  [X]  denotes positive finding, [ ]  denotes negative finding Cardiac  Comments:  Chest pain or chest pressure:    Shortness of breath upon exertion: x   Short of breath when lying flat: x   Irregular heart rhythm:        Vascular    Pain in calf, thigh, or hip brought on by ambulation:    Pain in feet at night that wakes you up from your sleep:     Blood clot in your veins:    Leg swelling:  x Bilateral      Pulmonary    Oxygen at home: x   Productive cough:  x   Wheezing:         Neurologic    Sudden weakness in arms or legs:     Sudden numbness in arms or legs:     Sudden onset of difficulty speaking or slurred  speech:    Temporary loss of vision in one eye:     Problems with dizziness:         Gastrointestinal  Blood in stool:     Vomited blood:         Genitourinary    Burning when urinating:     Blood in urine:        Psychiatric    Major depression:         Hematologic    Bleeding problems:    Problems with blood clotting too easily:        Skin    Rashes or ulcers:        Constitutional    Fever or chills:      PHYSICAL EXAM: Vitals:   06/20/19 0947  BP: 135/70  Pulse: 96  Temp: 98.1 F (36.7 C)  TempSrc: Temporal  SpO2: 98%  Weight: (!) 330 lb 9.6 oz (150 kg)  Height: 6\' 1"  (1.854 m)    GENERAL: The patient is a well-nourished male, in no acute distress. The vital signs are documented above. CARDIAC: There is a regular rate and rhythm.  VASCULAR:  2+ radial pulse bilaterally Unable to examine femoral pulses since patient cannot lay flat Brisk DP/PT signals bilaterally - sound triphasic No distal ulcerations Congestion of both lower extremities with 2+ edema Circumferential skin thickening bilaterally - open ulcerations bilaterally PULMONARY: There is good air exchange bilaterally without wheezing or rales. ABDOMEN: Soft and non-tender with normal pitched bowel sounds.  MUSCULOSKELETAL: There are no major deformities or cyanosis. NEUROLOGIC: No focal weakness or paresthesias are detected. SKIN: There are no ulcers or rashes noted. PSYCHIATRIC: The patient has a normal affect.      DATA:   ABIs are 0.96 on the right triphasic and 1.09 on the left biphasic.  Toe pressures are 105 and 115 bilaterally with pulsatile toe tracings.  Assessment/Plan:  68-year male presents with fairly profound bilateral lower extremity leg swelling with ulcerations in both lower extremities (on the shins).  His exam is very consistent with venous insufficiency as pictured above.  He has been in in the past with success getting his wounds to heal.  I will put him  back in Unna boots today and have made referral to wound clinic to manage these for weekly changes.  I did review his ABIs today and I think he has near normal ABIs with pulsatile toe tracings bilaterally and adequate toe pressures for wound healing.  Hard to feel pulses given edema but very brisk signals.  I do not think he has significant arterial disease that would warrant intervention at this time.  He has no ulcerations distally that suggest severe arterial insufficiency.  That being said I will have him come back to see me for bilateral lower extremity venous reflux study.  Discussed in detail with him and his wife that depending where his reflux is whether superficial or deep we may have some other intervention like potentially laser ablation to offer him.  Ultimately when he is finished with Unna boots I will need to transition him to thigh-high compression 20 to 30 mmHg.   [employer], MD [facility] Office: [phone] Pager: [phone]

## 2019-06-21 ENCOUNTER — Ambulatory Visit: Payer: HMO | Admitting: Family Medicine

## 2019-06-22 ENCOUNTER — Other Ambulatory Visit: Payer: Self-pay | Admitting: Pharmacy Technician

## 2019-06-22 NOTE — Patient Outreach (Signed)
[facility]) Care Management  06/22/2019  DAN SCEARCE Jul 29, 1951 [id number]    Successful call placed to patients wife regarding patient assistance application(s) for Trulicity, Januvia and Xarelto , HIPAA identifiers verified. Mrs. Schara confirms that applications have been received, however she has not had the opportunity to fill them out. Confirmed that she has my contact information and request that they contact me with any questions they may have.  Follow up:  Will route note to [facility] for case closure if documents have not been received in the next 15 business days.  Maud Deed Chana Bode [place] Certified Pharmacy Technician [place] Management Direct Dial:[phone]

## 2019-06-23 ENCOUNTER — Other Ambulatory Visit: Payer: Self-pay

## 2019-06-23 ENCOUNTER — Encounter: Payer: Self-pay | Admitting: Family Medicine

## 2019-06-23 ENCOUNTER — Ambulatory Visit (INDEPENDENT_AMBULATORY_CARE_PROVIDER_SITE_OTHER): Payer: HMO | Admitting: Family Medicine

## 2019-06-23 VITALS — BP 128/74 | HR 90 | Temp 98.2°F | Resp 24 | Ht 73.0 in | Wt 330.0 lb

## 2019-06-23 DIAGNOSIS — L97219 Non-pressure chronic ulcer of right calf with unspecified severity: Secondary | ICD-10-CM | POA: Diagnosis not present

## 2019-06-23 DIAGNOSIS — L97229 Non-pressure chronic ulcer of left calf with unspecified severity: Secondary | ICD-10-CM | POA: Diagnosis not present

## 2019-06-23 DIAGNOSIS — R0902 Hypoxemia: Secondary | ICD-10-CM | POA: Diagnosis not present

## 2019-06-23 DIAGNOSIS — I5042 Chronic combined systolic (congestive) and diastolic (congestive) heart failure: Secondary | ICD-10-CM | POA: Diagnosis not present

## 2019-06-23 DIAGNOSIS — I83002 Varicose veins of unspecified lower extremity with ulcer of calf: Secondary | ICD-10-CM

## 2019-06-23 DIAGNOSIS — L97209 Non-pressure chronic ulcer of unspecified calf with unspecified severity: Secondary | ICD-10-CM

## 2019-06-23 NOTE — Progress Notes (Signed)
Subjective:    Patient ID: William Alvarado, male    DOB: [birth year], 68 y.o.   MRN: [id number]  HPI  05/11/19 Patient went to the emergency room on August 16.  A varicose vein on the dorsum of his left foot suddenly ruptured and was bleeding profusely.  Patient went to the emergency room and the wound was treated with 3 sutures.  This area is examined today and the area is not bleeding.  The skin seems to be healing normally.  There is no evidence of cellulitis or secondary infection.  Patient is in bilateral Unna boots.  These are removed today.  He has a large bulla on the medial posterior right shin.  This ruptured when I remove the Unna boot and a large golf ball sized clear jellylike coagulated fluid was removed.  A similar ulcer ruptured on his medial posterior right ankle.  This clotted serous-like fluid was roughly the size of a grape.  These 2 areas were covered with Silvadene, nonadherent gauze, and then the right leg was replaced in an [employer].  He has varicose veins all throughout both legs.  The Unna boot was removed from his left leg.  This revealed the stitches on the dorsum of his left foot.  There was no ulcers or weeping edema on his left leg.  His left leg was then placed in an [employer] as well.  There was also a small superficial ulcer on the superior anterior portion of his right shin that was roughly the diameter of a silver dollar.  At that time, my plan was: Patient has an appointment to see the vascular surgeon given the varicose veins in his leg.  This was made for him by the emergency room.  In the meantime, I have recommended putting the patient back in Unna boots to facilitate the healing of the venous stasis ulcers seen in the right leg and demonstrated by the yellow circles on the drawing.  Each of the yellow circle areas was covered with Silvadene and nonadherent gauze and then the patient was put in an Unna boot from the toes to the knee.  Silvadene was applied to the  suture location on the dorsum of the left foot and covered with nonadherent gauze and then placed in an Unna boot as well.  Patient was instructed to return Monday to change the Unna boot dressings.  Sutures will be removed on Monday.  I suspect that the wounds on the right leg will take several weeks if not 2 months to heal.  Patient will require compressive dressings throughout that time to allow healing  05/15/19     Unna boot was removed from his right leg.  As the pictures above show, there is still a triangular-shaped ulcer over his medial shin.  This is shown in the second picture above.  There is also a triangular-shaped ulcer over his superior anterior lateral shin.  Both of these have improved dramatically since his last visit.  The swelling in his legs have also improved.  The 3 stitches were removed from the dorsum of his left foot.  He began to bleed from each of the suture locations.  This wound was then covered with Silvadene, nonadherent gauze, and the patient was placed back in an Unna boot in his left leg.  I believe that the pressure will stop the bleeding.  The 2 ulcers shown above in the photos were covered with Silvadene, nonadherent gauze, and the patient was placed  back in an Unna boot in his right leg.  His weight is up substantially. Wt Readings from Last 3 Encounters:  06/23/19 (!) 330 lb (149.7 kg)  06/20/19 (!) 330 lb 9.6 oz (150 kg)  05/25/19 (!) 324 lb (147 kg)   He has bibasilar crackles in both lungs and also swelling above the Unna boots in both legs.  Patient denies any chest pain or shortness of breath.  He does admit to eating more recently.  At that time, my plan was: Please see the history of present illness, the patient was placed in Unna boots bilaterally.  I will see the patient back on Thursday and change the dressings until healed.  I did recommend that the patient use Zaroxolyn tomorrow morning prior to his Lasix to facilitate increased diuresis as he appears  to be fluid overloaded on exam.  Recheck on Thursday along with a BMP.  05/18/19 Patient has lost 9 pounds since I last saw him.  His breathing is better and the swelling in his legs has completely subsided.  His Unna boots were removed today.  The ulcer on the anterior right shin at the superior portion just below the patella has essentially healed.  The ulcer over the medial calf which was the largest is roughly 80% healed.  There is no evidence of secondary cellulitis.  The ulcer on his posterior right ankle is still about 50% healed.  Although better this is healing slower than the rest.  The Unna boot was removed from his left leg and there was no residual bleeding from where the sutures were removed.  The wound has healed well.  AT that time, my plan was: Wound has completely healed on the left foot.  Wounds are healing nicely on the right leg.  Unna boots were replaced bilaterally.  Recheck here Monday.  If he continues his current pace, I suspect that he will be resolved within 1 to 2 weeks.  05/22/19 The left foot has completely healed after the Unna boot was removed.  The Unna boot was removed from the right leg and the ulcer on the anterior right shin and also on the medial anterior right calf and shin have completely healed and re-epithelialized.  These look excellent.  He still has a very superficial ulcer on the posterior aspect of his right ankle just above the lateral malleolus.  This is healing nicely and has now almost completely filled in although it is yet to re-epithelialize.  05/25/19 Unna boot was removed from his right leg today.  The ulcer on his posterior right calf is completely healed.  There is no residual wound.  There is no evidence of secondary cellulitis.  There is no pitting edema.  The patient did bring his compression hose today.   At that time, my plan was: Ulcer has completely healed on the posterior right calf.  Unna boot was removed today from the right leg.  The wound  was cleaned and the patient was placed in his personal compression hose.  I recommended that he wear the compression hose on a daily basis.  Follow-up in November for fasting lab work or sooner if other problems arise.  Recommended compliance with compression hose to prevent leg swelling and ulcer formation in the future.  06/23/19 Patient was recently seen at the vein and vascular clinic for evaluation of his chronic venous insufficiency.  He was found to have recurrent venous stasis ulcers forming on both legs left worse than right.  On  the superior aspect of his right lateral shin there is a very superficial ulcer that is square in shape and roughly 4 cm x 4 cm.  There is a punctate small ulcer on the medial aspect of the middle right shin that is roughly 1.5 cm in diameter.  On the lateral aspect of the left shin just above the malleolus or 2 large ulcers.  One is 6 cm in diameter and circular.  Just below that is a ulcer that is 2.5 to 3 cm in diameter.  Both are very shallow with underlying exposed dermis. Past Medical History:  Diagnosis Date  . Acute respiratory failure with hypoxia (HCC)    Hospitalized with acute respiratory failure secondary to CAP, diastolic CHF, and pulmonary embolism  . AKI (acute kidney injury) ([place])   . Allergy    allergic rhinitis  . Altered mental status   . Atrial fibrillation ([place])   . Chronic anticoagulation 03/26/2017   Failed Coumadin (PE with therapeutic INR). Now on Xarelto  . Colon polyps   . Community acquired pneumonia of right lower lobe of lung   . COPD (chronic obstructive pulmonary disease) ([place])   . Degenerative disc disease, lumbar   . Diabetes mellitus without complication ([place])   . History of pulmonary embolus (PE) 03/26/2017  . Hypertension   . Mitral regurgitation 12/26/2012  . Obesity (BMI 30-39.9) 12/09/2012  . PAF (paroxysmal atrial fibrillation) ([place])    DCCV in 2014 with recurrent PAF in setting of respiratory failure June 2018. S/P DCCV  04/13/17 to NSR  . Persistent atrial fibrillation ([place])   . Pneumonia ~ 2016  . Sepsis ([place]) 03/10/2017  . Sleep apnea   . Systolic murmur   . Type II diabetes mellitus ([place])    Past Surgical History:  Procedure Laterality Date  . CARDIOVERSION N/A 01/09/2013   Procedure: CARDIOVERSION;  Surgeon: Fay Records, MD;  Location: [facility];  Service: Cardiovascular;  Laterality: N/A;  . CARDIOVERSION N/A 04/13/2017   Procedure: CARDIOVERSION;  Surgeon: Sueanne Margarita, MD;  Location: [facility] ENDOSCOPY;  Service: Cardiovascular;  Laterality: N/A;  . TEE WITHOUT CARDIOVERSION N/A 01/09/2013   Procedure: TRANSESOPHAGEAL ECHOCARDIOGRAM (TEE);  Surgeon: Fay Records, MD;  Location: [facility] ENDOSCOPY;  Service: Cardiovascular;  Laterality: N/A;   Current Outpatient Medications on File Prior to Visit  Medication Sig Dispense Refill  . Dulaglutide (TRULICITY) 1.5 SA/6.3KZ SOPN INJECT 1.5 MG INTO THE SKIN ONCE A WEEK 4 pen 3  . furosemide (LASIX) 40 MG tablet Take 2 tablets (80 mg total) by mouth 2 (two) times daily. 360 tablet 3  . metoprolol tartrate (LOPRESSOR) 50 MG tablet Take 1 tablet (50 mg total) by mouth 2 (two) times daily. 180 tablet 3  . potassium chloride SA (K-DUR,KLOR-CON) 20 MEQ tablet Take 1 tablet (20 mEq total) by mouth daily. 90 tablet 3  . rivaroxaban (XARELTO) 20 MG TABS tablet TAKE 1 TABLET BY MOUTH ONCE DAILY WITH SUPPER 90 tablet 3  . sitaGLIPtin (JANUVIA) 100 MG tablet Take 1 tablet (100 mg total) by mouth daily. 90 tablet 2   No current facility-administered medications on file prior to visit.    No Known Allergies Social History   Socioeconomic History  . Marital status: Married    Spouse name: Not on file  . Number of children: 2  . Years of education: Not on file  . Highest education level: Not on file  Occupational History    Comment: Machinist  Social Needs  . Financial  resource strain: Not on file  . Food insecurity    Worry: Never true    Inability: Never true  .  Transportation needs    Medical: No    Non-medical: No  Tobacco Use  . Smoking status: Former Smoker    Packs/day: 1.00    Years: 43.00    Pack years: 43.00    Types: Cigarettes    Quit date: 03/08/2017    Years since quitting: 2.2  . Smokeless tobacco: Never Used  Substance and Sexual Activity  . Alcohol use: No    Comment: Rare  . Drug use: No  . Sexual activity: Not Currently  Lifestyle  . Physical activity    Days per week: Not on file    Minutes per session: Not on file  . Stress: Not on file  Relationships  . Social Musician on phone: Not on file    Gets together: Not on file    Attends religious service: Not on file    Active member of club or organization: Not on file    Attends meetings of clubs or organizations: Not on file    Relationship status: Not on file  . Intimate partner violence    Fear of current or ex partner: Not on file    Emotionally abused: Not on file    Physically abused: Not on file    Forced sexual activity: Not on file  Other Topics Concern  . Not on file  Social History Narrative  . Not on file      Review of Systems  All other systems reviewed and are negative.      Objective:   Physical Exam Vitals signs reviewed.  Constitutional:      Appearance: He is obese.  Cardiovascular:     Rate and Rhythm: Normal rate and regular rhythm.     Heart sounds: Normal heart sounds.  Pulmonary:     Effort: Pulmonary effort is normal.     Breath sounds: Normal breath sounds. No stridor. No wheezing, rhonchi or rales.  Musculoskeletal:     Right lower leg: He exhibits laceration. He exhibits no tenderness, no swelling and no deformity. Edema present.     Left lower leg: He exhibits laceration. Edema present.       Legs:  Skin:    Findings: Lesion present.  Neurological:     Mental Status: He is alert.           Assessment & Plan:   Venous stasis ulcer of calf, unspecified laterality, unspecified ulcer stage,  unspecified whether varicose veins present [facility])  Patient has bilateral venous stasis ulcers of both shins as diagrammed above and described in the history of present illness.  Both his right and left leg were placed in Unna boots today.  Vein and vascular has recommended Unna boots weekly until he follows up with them in November.  Patient requires home health nursing to change in Unna boots.  He is a very high risk patient should he be exposed to COVID due to his chronic right-sided heart failure, COPD, and respiratory compromise coupled with morbid obesity and diabetes.  Therefore I would like to keep him out of the office as much as possible and reduce his risk of exposure.  I will see if I can arrange home health nursing to come by weekly and change his Unna boots.  I will see the patient back on Tuesday to replace the Unna boots applied today

## 2019-06-26 ENCOUNTER — Ambulatory Visit: Payer: Self-pay | Admitting: Pharmacist

## 2019-06-26 ENCOUNTER — Other Ambulatory Visit: Payer: Self-pay | Admitting: Family Medicine

## 2019-06-26 ENCOUNTER — Other Ambulatory Visit: Payer: Self-pay | Admitting: Pharmacist

## 2019-06-26 DIAGNOSIS — I83002 Varicose veins of unspecified lower extremity with ulcer of calf: Secondary | ICD-10-CM

## 2019-06-26 DIAGNOSIS — L97209 Non-pressure chronic ulcer of unspecified calf with unspecified severity: Secondary | ICD-10-CM

## 2019-06-26 DIAGNOSIS — I739 Peripheral vascular disease, unspecified: Secondary | ICD-10-CM

## 2019-06-26 NOTE — Patient Outreach (Signed)
[facility]) [facility] [place]  06/26/2019  William Alvarado 1950-10-22 [id number]  Successful call to patient's wife, [name], to remind her to return patient assistance applications.  She has not yet filled them out yet, but will get to them soon.  Deadlines are approaching for patient assistance programs, therefore encouraged her to return ASAP.   Regina Eck, PharmD, [place]  [phone]

## 2019-06-27 ENCOUNTER — Ambulatory Visit (INDEPENDENT_AMBULATORY_CARE_PROVIDER_SITE_OTHER): Payer: HMO | Admitting: Family Medicine

## 2019-06-27 ENCOUNTER — Encounter: Payer: Self-pay | Admitting: Family Medicine

## 2019-06-27 ENCOUNTER — Ambulatory Visit: Payer: Self-pay | Admitting: Pharmacist

## 2019-06-27 ENCOUNTER — Other Ambulatory Visit: Payer: Self-pay

## 2019-06-27 VITALS — BP 136/90 | HR 88 | Temp 96.9°F | Resp 20 | Ht 73.0 in | Wt 327.0 lb

## 2019-06-27 DIAGNOSIS — I83002 Varicose veins of unspecified lower extremity with ulcer of calf: Secondary | ICD-10-CM

## 2019-06-27 DIAGNOSIS — L97219 Non-pressure chronic ulcer of right calf with unspecified severity: Secondary | ICD-10-CM | POA: Diagnosis not present

## 2019-06-27 DIAGNOSIS — L97229 Non-pressure chronic ulcer of left calf with unspecified severity: Secondary | ICD-10-CM | POA: Diagnosis not present

## 2019-06-27 DIAGNOSIS — L97209 Non-pressure chronic ulcer of unspecified calf with unspecified severity: Secondary | ICD-10-CM

## 2019-06-27 NOTE — Progress Notes (Signed)
Subjective:    Patient ID: William Alvarado, male    DOB: July 10, 1951, 68 y.o.   MRN: [id number]  HPI  05/11/19 Patient went to the emergency room on August 16.  A varicose vein on the dorsum of his left foot suddenly ruptured and was bleeding profusely.  Patient went to the emergency room and the wound was treated with 3 sutures.  This area is examined today and the area is not bleeding.  The skin seems to be healing normally.  There is no evidence of cellulitis or secondary infection.  Patient is in bilateral Unna boots.  These are removed today.  He has a large bulla on the medial posterior right shin.  This ruptured when I remove the Unna boot and a large golf ball sized clear jellylike coagulated fluid was removed.  A similar ulcer ruptured on his medial posterior right ankle.  This clotted serous-like fluid was roughly the size of a grape.  These 2 areas were covered with Silvadene, nonadherent gauze, and then the right leg was replaced in an [employer].  He has varicose veins all throughout both legs.  The Unna boot was removed from his left leg.  This revealed the stitches on the dorsum of his left foot.  There was no ulcers or weeping edema on his left leg.  His left leg was then placed in an [employer] as well.  There was also a small superficial ulcer on the superior anterior portion of his right shin that was roughly the diameter of a silver dollar.  At that time, my plan was: Patient has an appointment to see the vascular surgeon given the varicose veins in his leg.  This was made for him by the emergency room.  In the meantime, I have recommended putting the patient back in Unna boots to facilitate the healing of the venous stasis ulcers seen in the right leg and demonstrated by the yellow circles on the drawing.  Each of the yellow circle areas was covered with Silvadene and nonadherent gauze and then the patient was put in an Unna boot from the toes to the knee.  Silvadene was applied to the  suture location on the dorsum of the left foot and covered with nonadherent gauze and then placed in an Unna boot as well.  Patient was instructed to return Monday to change the Unna boot dressings.  Sutures will be removed on Monday.  I suspect that the wounds on the right leg will take several weeks if not 2 months to heal.  Patient will require compressive dressings throughout that time to allow healing  05/15/19     Unna boot was removed from his right leg.  As the pictures above show, there is still a triangular-shaped ulcer over his medial shin.  This is shown in the second picture above.  There is also a triangular-shaped ulcer over his superior anterior lateral shin.  Both of these have improved dramatically since his last visit.  The swelling in his legs have also improved.  The 3 stitches were removed from the dorsum of his left foot.  He began to bleed from each of the suture locations.  This wound was then covered with Silvadene, nonadherent gauze, and the patient was placed back in an Unna boot in his left leg.  I believe that the pressure will stop the bleeding.  The 2 ulcers shown above in the photos were covered with Silvadene, nonadherent gauze, and the patient was placed  back in an Unna boot in his right leg.  His weight is up substantially. Wt Readings from Last 3 Encounters:  06/27/19 (!) 327 lb (148.3 kg)  06/23/19 (!) 330 lb (149.7 kg)  06/20/19 (!) 330 lb 9.6 oz (150 kg)   He has bibasilar crackles in both lungs and also swelling above the Unna boots in both legs.  Patient denies any chest pain or shortness of breath.  He does admit to eating more recently.  At that time, my plan was: Please see the history of present illness, the patient was placed in Unna boots bilaterally.  I will see the patient back on Thursday and change the dressings until healed.  I did recommend that the patient use Zaroxolyn tomorrow morning prior to his Lasix to facilitate increased diuresis as he  appears to be fluid overloaded on exam.  Recheck on Thursday along with a BMP.  05/18/19 Patient has lost 9 pounds since I last saw him.  His breathing is better and the swelling in his legs has completely subsided.  His Unna boots were removed today.  The ulcer on the anterior right shin at the superior portion just below the patella has essentially healed.  The ulcer over the medial calf which was the largest is roughly 80% healed.  There is no evidence of secondary cellulitis.  The ulcer on his posterior right ankle is still about 50% healed.  Although better this is healing slower than the rest.  The Unna boot was removed from his left leg and there was no residual bleeding from where the sutures were removed.  The wound has healed well.  AT that time, my plan was: Wound has completely healed on the left foot.  Wounds are healing nicely on the right leg.  Unna boots were replaced bilaterally.  Recheck here Monday.  If he continues his current pace, I suspect that he will be resolved within 1 to 2 weeks.  05/22/19 The left foot has completely healed after the Unna boot was removed.  The Unna boot was removed from the right leg and the ulcer on the anterior right shin and also on the medial anterior right calf and shin have completely healed and re-epithelialized.  These look excellent.  He still has a very superficial ulcer on the posterior aspect of his right ankle just above the lateral malleolus.  This is healing nicely and has now almost completely filled in although it is yet to re-epithelialize.  05/25/19 Unna boot was removed from his right leg today.  The ulcer on his posterior right calf is completely healed.  There is no residual wound.  There is no evidence of secondary cellulitis.  There is no pitting edema.  The patient did bring his compression hose today.   At that time, my plan was: Ulcer has completely healed on the posterior right calf.  Unna boot was removed today from the right leg.   The wound was cleaned and the patient was placed in his personal compression hose.  I recommended that he wear the compression hose on a daily basis.  Follow-up in November for fasting lab work or sooner if other problems arise.  Recommended compliance with compression hose to prevent leg swelling and ulcer formation in the future.  06/23/19 Patient was recently seen at the vein and vascular clinic for evaluation of his chronic venous insufficiency.  He was found to have recurrent venous stasis ulcers forming on both legs left worse than right.  On  the superior aspect of his right lateral shin there is a very superficial ulcer that is square in shape and roughly 4 cm x 4 cm.  There is a punctate small ulcer on the medial aspect of the middle right shin that is roughly 1.5 cm in diameter.  On the lateral aspect of the left shin just above the malleolus or 2 large ulcers.  One is 6 cm in diameter and circular.  Just below that is a ulcer that is 2.5 to 3 cm in diameter.  Both are very shallow with underlying exposed dermis.  At that time, my plan was: Patient has bilateral venous stasis ulcers of both shins as diagrammed above and described in the history of present illness.  Both his right and left leg were placed in Unna boots today.  Vein and vascular has recommended Unna boots weekly until he follows up with them in November.  Patient requires home health nursing to change in Unna boots.  He is a very high risk patient should he be exposed to [place] due to his chronic right-sided heart failure, COPD, and respiratory compromise coupled with morbid obesity and diabetes.  Therefore I would like to keep him out of the office as much as possible and reduce his risk of exposure.  I will see if I can arrange home health nursing to come by weekly and change his Unna boots.  I will see the patient back on Tuesday to replace the Unna boots applied today  06/27/19 Patient is here today for recheck.  The lesion over the  superior lateral portion of his left shin is essentially unchanged.  It is still a very large superficial ulcer down to the underlying dermis.  It consist of 2 ulcers that have coalesced into one complex.  One is 6 cm in diameter.  The other is 3 cm in diameter.  It is not weeping or bleeding.  There is no evidence of secondary cellulitis.  The area on the right lateral shin has improved.  It is now much smaller and is roughly the diameter of a quarter. Past Medical History:  Diagnosis Date   Acute respiratory failure with hypoxia [facility])    Hospitalized with acute respiratory failure secondary to CAP, diastolic CHF, and pulmonary embolism   AKI (acute kidney injury) ([place])    Allergy    allergic rhinitis   Altered mental status    Atrial fibrillation (HCC)    Chronic anticoagulation 03/26/2017   Failed Coumadin (PE with therapeutic INR). Now on Xarelto   Colon polyps    Community acquired pneumonia of right lower lobe of lung    COPD (chronic obstructive pulmonary disease) (HCC)    Degenerative disc disease, lumbar    Diabetes mellitus without complication ([place])    History of pulmonary embolus (PE) 03/26/2017   Hypertension    Mitral regurgitation 12/26/2012   Obesity (BMI 30-39.9) 12/09/2012   PAF (paroxysmal atrial fibrillation) ([place])    DCCV in 2014 with recurrent PAF in setting of respiratory failure June 2018. S/P DCCV 04/13/17 to NSR   Persistent atrial fibrillation ([place])    Pneumonia ~ 2016   Sepsis ([place]) 03/10/2017   Sleep apnea    Systolic murmur    Type II diabetes mellitus ([place])    Past Surgical History:  Procedure Laterality Date   CARDIOVERSION N/A 01/09/2013   Procedure: CARDIOVERSION;  Surgeon: Fay Records, MD;  Location: [facility];  Service: Cardiovascular;  Laterality: N/A;   CARDIOVERSION N/A 04/13/2017  Procedure: CARDIOVERSION;  Surgeon: Quintella Reichert, MD;  Location: [facility] ENDOSCOPY;  Service: Cardiovascular;  Laterality: N/A;   TEE WITHOUT  CARDIOVERSION N/A 01/09/2013   Procedure: TRANSESOPHAGEAL ECHOCARDIOGRAM (TEE);  Surgeon: Pricilla Riffle, MD;  Location: [facility] ENDOSCOPY;  Service: Cardiovascular;  Laterality: N/A;   Current Outpatient Medications on File Prior to Visit  Medication Sig Dispense Refill   Dulaglutide (TRULICITY) 1.5 MG/0.5ML SOPN INJECT 1.5 MG INTO THE SKIN ONCE A WEEK 4 pen 3   furosemide (LASIX) 40 MG tablet Take 2 tablets (80 mg total) by mouth 2 (two) times daily. 360 tablet 3   metoprolol tartrate (LOPRESSOR) 50 MG tablet Take 1 tablet (50 mg total) by mouth 2 (two) times daily. 180 tablet 3   potassium chloride SA (K-DUR,KLOR-CON) 20 MEQ tablet Take 1 tablet (20 mEq total) by mouth daily. 90 tablet 3   rivaroxaban (XARELTO) 20 MG TABS tablet TAKE 1 TABLET BY MOUTH ONCE DAILY WITH SUPPER 90 tablet 3   sitaGLIPtin (JANUVIA) 100 MG tablet Take 1 tablet (100 mg total) by mouth daily. 90 tablet 2   No current facility-administered medications on file prior to visit.    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: Not on file   Food insecurity    Worry: Never true    Inability: Never true   Transportation needs    Medical: No    Non-medical: No  Tobacco Use   Smoking status: Former Smoker    Packs/day: 1.00    Years: 43.00    Pack years: 43.00    Types: Cigarettes    Quit date: 03/08/2017    Years since quitting: 2.3   Smokeless tobacco: Never Used  Substance and Sexual Activity   Alcohol use: No    Comment: Rare   Drug use: No   Sexual activity: Not Currently  Lifestyle   Physical activity    Days per week: Not on file    Minutes per session: Not on file   Stress: Not on file  Relationships   Social connections    Talks  on phone: Not on file    Gets together: Not on file    Attends religious service: Not on file    Active member of club or organization: Not on file    Attends meetings of clubs or organizations: Not on file    Relationship status: Not on file   Intimate partner violence    Fear of current or ex partner: Not on file    Emotionally abused: Not on file    Physically abused: Not on file    Forced sexual activity: Not on file  Other Topics Concern   Not on file  Social History Narrative   Not on file      Review of Systems  All other systems reviewed and are negative.      Objective:   Physical Exam Vitals signs reviewed.  Constitutional:      Appearance: He is obese.  Cardiovascular:     Rate and Rhythm: Normal rate and regular rhythm.     Heart sounds: Normal heart sounds.  Pulmonary:     Effort: Pulmonary effort is normal.  Breath sounds: Normal breath sounds. No stridor. No wheezing, rhonchi or rales.  Musculoskeletal:     Right lower leg: He exhibits laceration. He exhibits no tenderness, no swelling and no deformity. Edema present.     Left lower leg: He exhibits laceration. Edema present.       Legs:  Skin:    Findings: Lesion present.  Neurological:     Mental Status: He is alert.           Assessment & Plan:   Venous stasis ulcer of calf, unspecified laterality, unspecified ulcer stage, unspecified whether varicose veins present (HCC)  Unna boots were reapplied to both legs.  Recheck here on Monday to repeat dressing changes.  Awaiting the referral to home health to perform dressing changes at home.

## 2019-07-03 ENCOUNTER — Encounter: Payer: Self-pay | Admitting: Family Medicine

## 2019-07-03 ENCOUNTER — Telehealth: Payer: Self-pay | Admitting: Cardiology

## 2019-07-03 ENCOUNTER — Ambulatory Visit (INDEPENDENT_AMBULATORY_CARE_PROVIDER_SITE_OTHER): Payer: HMO | Admitting: Family Medicine

## 2019-07-03 ENCOUNTER — Other Ambulatory Visit: Payer: Self-pay

## 2019-07-03 VITALS — BP 112/68 | HR 85 | Temp 98.2°F | Resp 18 | Ht 73.0 in | Wt 327.0 lb

## 2019-07-03 DIAGNOSIS — I83002 Varicose veins of unspecified lower extremity with ulcer of calf: Secondary | ICD-10-CM

## 2019-07-03 DIAGNOSIS — L97209 Non-pressure chronic ulcer of unspecified calf with unspecified severity: Secondary | ICD-10-CM | POA: Diagnosis not present

## 2019-07-03 MED ORDER — PRAVASTATIN SODIUM 20 MG PO TABS: 20.0000 mg | ORAL_TABLET | Freq: Every day | ORAL | 3 refills | Status: AC

## 2019-07-03 NOTE — Telephone Encounter (Signed)
New Message  Pt c/o Shortness Of Breath: STAT if SOB developed within the last 24 hours or pt is noticeably SOB on the phone  1. Are you currently SOB (can you hear that pt is SOB on the phone)? Yes on exertion  2. How long have you been experiencing SOB? A week  3. Are you SOB when sitting or when up moving around? Up and moving around  4. Are you currently experiencing any other symptoms? No other symptoms  Patient would like a stress test to be done if possible.

## 2019-07-03 NOTE — Telephone Encounter (Signed)
Returned call to patient's wife. He reports SOB on exertion - minimal activity. He uses home oxygen. Wife states patient saw Dr. Dennard Schaumann today who recommended he call our office to request a stress test.   Will route to MD to review and advise

## 2019-07-03 NOTE — Telephone Encounter (Signed)
paov William Alvarado  

## 2019-07-03 NOTE — Progress Notes (Signed)
Subjective:    Patient ID: William Alvarado, male    DOB: July 10, 1951, 68 y.o.   MRN: [id number]  HPI  05/11/19 Patient went to the emergency room on August 16.  A varicose vein on the dorsum of his left foot suddenly ruptured and was bleeding profusely.  Patient went to the emergency room and the wound was treated with 3 sutures.  This area is examined today and the area is not bleeding.  The skin seems to be healing normally.  There is no evidence of cellulitis or secondary infection.  Patient is in bilateral Unna boots.  These are removed today.  He has a large bulla on the medial posterior right shin.  This ruptured when I remove the Unna boot and a large golf ball sized clear jellylike coagulated fluid was removed.  A similar ulcer ruptured on his medial posterior right ankle.  This clotted serous-like fluid was roughly the size of a grape.  These 2 areas were covered with Silvadene, nonadherent gauze, and then the right leg was replaced in an [employer].  He has varicose veins all throughout both legs.  The Unna boot was removed from his left leg.  This revealed the stitches on the dorsum of his left foot.  There was no ulcers or weeping edema on his left leg.  His left leg was then placed in an [employer] as well.  There was also a small superficial ulcer on the superior anterior portion of his right shin that was roughly the diameter of a silver dollar.  At that time, my plan was: Patient has an appointment to see the vascular surgeon given the varicose veins in his leg.  This was made for him by the emergency room.  In the meantime, I have recommended putting the patient back in Unna boots to facilitate the healing of the venous stasis ulcers seen in the right leg and demonstrated by the yellow circles on the drawing.  Each of the yellow circle areas was covered with Silvadene and nonadherent gauze and then the patient was put in an Unna boot from the toes to the knee.  Silvadene was applied to the  suture location on the dorsum of the left foot and covered with nonadherent gauze and then placed in an Unna boot as well.  Patient was instructed to return Monday to change the Unna boot dressings.  Sutures will be removed on Monday.  I suspect that the wounds on the right leg will take several weeks if not 2 months to heal.  Patient will require compressive dressings throughout that time to allow healing  05/15/19     Unna boot was removed from his right leg.  As the pictures above show, there is still a triangular-shaped ulcer over his medial shin.  This is shown in the second picture above.  There is also a triangular-shaped ulcer over his superior anterior lateral shin.  Both of these have improved dramatically since his last visit.  The swelling in his legs have also improved.  The 3 stitches were removed from the dorsum of his left foot.  He began to bleed from each of the suture locations.  This wound was then covered with Silvadene, nonadherent gauze, and the patient was placed back in an Unna boot in his left leg.  I believe that the pressure will stop the bleeding.  The 2 ulcers shown above in the photos were covered with Silvadene, nonadherent gauze, and the patient was placed  back in an Unna boot in his right leg.  His weight is up substantially. Wt Readings from Last 3 Encounters:  07/03/19 (!) 327 lb (148.3 kg)  06/27/19 (!) 327 lb (148.3 kg)  06/23/19 (!) 330 lb (149.7 kg)   He has bibasilar crackles in both lungs and also swelling above the Unna boots in both legs.  Patient denies any chest pain or shortness of breath.  He does admit to eating more recently.  At that time, my plan was: Please see the history of present illness, the patient was placed in Unna boots bilaterally.  I will see the patient back on Thursday and change the dressings until healed.  I did recommend that the patient use Zaroxolyn tomorrow morning prior to his Lasix to facilitate increased diuresis as he appears to  be fluid overloaded on exam.  Recheck on Thursday along with a BMP.  05/18/19 Patient has lost 9 pounds since I last saw him.  His breathing is better and the swelling in his legs has completely subsided.  His Unna boots were removed today.  The ulcer on the anterior right shin at the superior portion just below the patella has essentially healed.  The ulcer over the medial calf which was the largest is roughly 80% healed.  There is no evidence of secondary cellulitis.  The ulcer on his posterior right ankle is still about 50% healed.  Although better this is healing slower than the rest.  The Unna boot was removed from his left leg and there was no residual bleeding from where the sutures were removed.  The wound has healed well.  AT that time, my plan was: Wound has completely healed on the left foot.  Wounds are healing nicely on the right leg.  Unna boots were replaced bilaterally.  Recheck here Monday.  If he continues his current pace, I suspect that he will be resolved within 1 to 2 weeks.  05/22/19 The left foot has completely healed after the Unna boot was removed.  The Unna boot was removed from the right leg and the ulcer on the anterior right shin and also on the medial anterior right calf and shin have completely healed and re-epithelialized.  These look excellent.  He still has a very superficial ulcer on the posterior aspect of his right ankle just above the lateral malleolus.  This is healing nicely and has now almost completely filled in although it is yet to re-epithelialize.  05/25/19 Unna boot was removed from his right leg today.  The ulcer on his posterior right calf is completely healed.  There is no residual wound.  There is no evidence of secondary cellulitis.  There is no pitting edema.  The patient did bring his compression hose today.   At that time, my plan was: Ulcer has completely healed on the posterior right calf.  Unna boot was removed today from the right leg.  The wound was  cleaned and the patient was placed in his personal compression hose.  I recommended that he wear the compression hose on a daily basis.  Follow-up in November for fasting lab work or sooner if other problems arise.  Recommended compliance with compression hose to prevent leg swelling and ulcer formation in the future.  06/23/19 Patient was recently seen at the vein and vascular clinic for evaluation of his chronic venous insufficiency.  He was found to have recurrent venous stasis ulcers forming on both legs left worse than right.  On the superior  aspect of his right lateral shin there is a very superficial ulcer that is square in shape and roughly 4 cm x 4 cm.  There is a punctate small ulcer on the medial aspect of the middle right shin that is roughly 1.5 cm in diameter.  On the lateral aspect of the left shin just above the malleolus or 2 large ulcers.  One is 6 cm in diameter and circular.  Just below that is a ulcer that is 2.5 to 3 cm in diameter.  Both are very shallow with underlying exposed dermis.  At that time, my plan was: Patient has bilateral venous stasis ulcers of both shins as diagrammed above and described in the history of present illness.  Both his right and left leg were placed in Unna boots today.  Vein and vascular has recommended Unna boots weekly until he follows up with them in November.  Patient requires home health nursing to change in Unna boots.  He is a very high risk patient should he be exposed to COVID due to his chronic right-sided heart failure, COPD, and respiratory compromise coupled with morbid obesity and diabetes.  Therefore I would like to keep him out of the office as much as possible and reduce his risk of exposure.  I will see if I can arrange home health nursing to come by weekly and change his Unna boots.  I will see the patient back on Tuesday to replace the Unna boots applied today  06/27/19 Patient is here today for recheck.  The lesion over the superior  lateral portion of his left shin is essentially unchanged.  It is still a very large superficial ulcer down to the underlying dermis.  It consist of 2 ulcers that have coalesced into one complex.  One is 6 cm in diameter.  The other is 3 cm in diameter.  It is not weeping or bleeding.  There is no evidence of secondary cellulitis.  The area on the right lateral shin has improved.  It is now much smaller and is roughly the diameter of a quarter.  07/03/19 Lesions appear dramatically better.  Although they are still present, they are much more shallow.  They are essentially re-epithelialized.  They are less than a millimeter deep.  There is no evidence of cellulitis.  There is no weeping fluid.  Only the lesion on the anterior right shin still shows any skin breakdown.  Patient's legs feel much better. Past Medical History:  Diagnosis Date   Acute respiratory failure with hypoxia [facility](HCC)    Hospitalized with acute respiratory failure secondary to CAP, diastolic CHF, and pulmonary embolism   AKI (acute kidney injury) (HCC)    Allergy    allergic rhinitis   Altered mental status    Atrial fibrillation (HCC)    Chronic anticoagulation 03/26/2017   Failed Coumadin (PE with therapeutic INR). Now on Xarelto   Colon polyps    Community acquired pneumonia of right lower lobe of lung    COPD (chronic obstructive pulmonary disease) (HCC)    Degenerative disc disease, lumbar    Diabetes mellitus without complication (HCC)    History of pulmonary embolus (PE) 03/26/2017   Hypertension    Mitral regurgitation 12/26/2012   Obesity (BMI 30-39.9) 12/09/2012   PAF (paroxysmal atrial fibrillation) (HCC)    DCCV in 2014 with recurrent PAF in setting of respiratory failure June 2018. S/P DCCV 04/13/17 to NSR   Persistent atrial fibrillation (HCC)    Pneumonia ~ 2016  Sepsis (HCC) 03/10/2017   Sleep apnea    Systolic murmur    Type II diabetes mellitus (HCC)    Past Surgical History:    Procedure Laterality Date   CARDIOVERSION N/A 01/09/2013   Procedure: CARDIOVERSION;  Surgeon: Pricilla Riffle, MD;  Location: [facility] ENDOSCOPY;  Service: Cardiovascular;  Laterality: N/A;   CARDIOVERSION N/A 04/13/2017   Procedure: CARDIOVERSION;  Surgeon: Quintella Reichert, MD;  Location: [facility] ENDOSCOPY;  Service: Cardiovascular;  Laterality: N/A;   TEE WITHOUT CARDIOVERSION N/A 01/09/2013   Procedure: TRANSESOPHAGEAL ECHOCARDIOGRAM (TEE);  Surgeon: Pricilla Riffle, MD;  Location: [facility] ENDOSCOPY;  Service: Cardiovascular;  Laterality: N/A;   Current Outpatient Medications on File Prior to Visit  Medication Sig Dispense Refill   Dulaglutide (TRULICITY) 1.5 MG/0.5ML SOPN INJECT 1.5 MG INTO THE SKIN ONCE A WEEK 4 pen 3   furosemide (LASIX) 40 MG tablet Take 2 tablets (80 mg total) by mouth 2 (two) times daily. 360 tablet 3   metoprolol tartrate (LOPRESSOR) 50 MG tablet Take 1 tablet (50 mg total) by mouth 2 (two) times daily. 180 tablet 3   potassium chloride SA (K-DUR,KLOR-CON) 20 MEQ tablet Take 1 tablet (20 mEq total) by mouth daily. 90 tablet 3   rivaroxaban (XARELTO) 20 MG TABS tablet TAKE 1 TABLET BY MOUTH ONCE DAILY WITH SUPPER 90 tablet 3   sitaGLIPtin (JANUVIA) 100 MG tablet Take 1 tablet (100 mg total) by mouth daily. 90 tablet 2   No current facility-administered medications on file prior to visit.    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: Not on file   Food insecurity    Worry: Never true    Inability: Never true   Transportation needs    Medical: No    Non-medical: No  Tobacco Use   Smoking status: Former Smoker    Packs/day: 1.00    Years: 43.00    Pack years: 43.00    Types: Cigarettes    Quit date:  03/08/2017    Years since quitting: 2.3   Smokeless tobacco: Never Used  Substance and Sexual Activity   Alcohol use: No    Comment: Rare   Drug use: No   Sexual activity: Not Currently  Lifestyle   Physical activity    Days per week: Not on file    Minutes per session: Not on file   Stress: Not on file  Relationships   Social connections    Talks on phone: Not on file    Gets together: Not on file    Attends religious service: Not on file    Active member of club or organization: Not on file    Attends meetings of clubs or organizations: Not on file    Relationship status: Not on file   Intimate partner violence    Fear of current or ex partner: Not on file    Emotionally abused: Not on file    Physically abused: Not on file    Forced sexual activity: Not on file  Other Topics Concern   Not on file  Social History Narrative   Not on file      Review of Systems  All other systems reviewed  and are negative.      Objective:   Physical Exam Vitals signs reviewed.  Constitutional:      Appearance: He is obese.  Cardiovascular:     Rate and Rhythm: Normal rate and regular rhythm.     Heart sounds: Normal heart sounds.  Pulmonary:     Effort: Pulmonary effort is normal.     Breath sounds: Normal breath sounds. No stridor. No wheezing, rhonchi or rales.  Musculoskeletal:     Right lower leg: He exhibits laceration. He exhibits no tenderness, no swelling and no deformity. Edema present.     Left lower leg: He exhibits laceration. Edema present.       Legs:  Skin:    Findings: Lesion present.  Neurological:     Mental Status: He is alert.           Assessment & Plan:   Venous stasis ulcer of calf, unspecified laterality, unspecified ulcer stage, unspecified whether varicose veins present (HCC)   Unna boots were reapplied to both legs.  Recheck here on Monday to repeat dressing changes.  Patient is also a diabetic and he is not on a statin.   Therefore I recommended starting pravastatin 20 mg a day due to his diabetes.

## 2019-07-05 NOTE — Telephone Encounter (Signed)
Wife called with MD recommendation to scheduled PAOV - patient has virtual visit on 07/17/19 with Lurena Joiner PA already scheduled. Advised to call back if symptoms worsen

## 2019-07-10 ENCOUNTER — Ambulatory Visit: Payer: HMO | Admitting: Family Medicine

## 2019-07-11 ENCOUNTER — Encounter (HOSPITAL_BASED_OUTPATIENT_CLINIC_OR_DEPARTMENT_OTHER): Payer: HMO | Attending: Internal Medicine | Admitting: Internal Medicine

## 2019-07-11 DIAGNOSIS — I340 Nonrheumatic mitral (valve) insufficiency: Secondary | ICD-10-CM | POA: Diagnosis not present

## 2019-07-11 DIAGNOSIS — Z87891 Personal history of nicotine dependence: Secondary | ICD-10-CM | POA: Diagnosis not present

## 2019-07-11 DIAGNOSIS — Z7901 Long term (current) use of anticoagulants: Secondary | ICD-10-CM | POA: Diagnosis not present

## 2019-07-11 DIAGNOSIS — Z7984 Long term (current) use of oral hypoglycemic drugs: Secondary | ICD-10-CM | POA: Diagnosis not present

## 2019-07-11 DIAGNOSIS — G4733 Obstructive sleep apnea (adult) (pediatric): Secondary | ICD-10-CM | POA: Diagnosis not present

## 2019-07-11 DIAGNOSIS — I480 Paroxysmal atrial fibrillation: Secondary | ICD-10-CM | POA: Diagnosis not present

## 2019-07-11 DIAGNOSIS — M5136 Other intervertebral disc degeneration, lumbar region: Secondary | ICD-10-CM | POA: Diagnosis not present

## 2019-07-11 DIAGNOSIS — I110 Hypertensive heart disease with heart failure: Secondary | ICD-10-CM | POA: Diagnosis not present

## 2019-07-11 DIAGNOSIS — L97821 Non-pressure chronic ulcer of other part of left lower leg limited to breakdown of skin: Secondary | ICD-10-CM | POA: Diagnosis not present

## 2019-07-11 DIAGNOSIS — Z9981 Dependence on supplemental oxygen: Secondary | ICD-10-CM | POA: Diagnosis not present

## 2019-07-11 DIAGNOSIS — I83028 Varicose veins of left lower extremity with ulcer other part of lower leg: Secondary | ICD-10-CM | POA: Diagnosis not present

## 2019-07-11 DIAGNOSIS — Z6841 Body Mass Index (BMI) 40.0 and over, adult: Secondary | ICD-10-CM | POA: Diagnosis not present

## 2019-07-11 DIAGNOSIS — E1151 Type 2 diabetes mellitus with diabetic peripheral angiopathy without gangrene: Secondary | ICD-10-CM | POA: Diagnosis not present

## 2019-07-11 DIAGNOSIS — Z86711 Personal history of pulmonary embolism: Secondary | ICD-10-CM | POA: Diagnosis not present

## 2019-07-11 DIAGNOSIS — G8929 Other chronic pain: Secondary | ICD-10-CM | POA: Diagnosis not present

## 2019-07-11 DIAGNOSIS — L97811 Non-pressure chronic ulcer of other part of right lower leg limited to breakdown of skin: Secondary | ICD-10-CM | POA: Diagnosis not present

## 2019-07-11 DIAGNOSIS — I83018 Varicose veins of right lower extremity with ulcer other part of lower leg: Secondary | ICD-10-CM | POA: Diagnosis not present

## 2019-07-11 DIAGNOSIS — J449 Chronic obstructive pulmonary disease, unspecified: Secondary | ICD-10-CM | POA: Diagnosis not present

## 2019-07-11 DIAGNOSIS — Z9181 History of falling: Secondary | ICD-10-CM | POA: Diagnosis not present

## 2019-07-11 DIAGNOSIS — Z8701 Personal history of pneumonia (recurrent): Secondary | ICD-10-CM | POA: Diagnosis not present

## 2019-07-11 DIAGNOSIS — I5042 Chronic combined systolic (congestive) and diastolic (congestive) heart failure: Secondary | ICD-10-CM | POA: Diagnosis not present

## 2019-07-11 DIAGNOSIS — E1140 Type 2 diabetes mellitus with diabetic neuropathy, unspecified: Secondary | ICD-10-CM | POA: Diagnosis not present

## 2019-07-11 DIAGNOSIS — K635 Polyp of colon: Secondary | ICD-10-CM | POA: Diagnosis not present

## 2019-07-14 DIAGNOSIS — Z87891 Personal history of nicotine dependence: Secondary | ICD-10-CM | POA: Diagnosis not present

## 2019-07-14 DIAGNOSIS — L97821 Non-pressure chronic ulcer of other part of left lower leg limited to breakdown of skin: Secondary | ICD-10-CM | POA: Diagnosis not present

## 2019-07-14 DIAGNOSIS — Z9981 Dependence on supplemental oxygen: Secondary | ICD-10-CM | POA: Diagnosis not present

## 2019-07-14 DIAGNOSIS — G4733 Obstructive sleep apnea (adult) (pediatric): Secondary | ICD-10-CM | POA: Diagnosis not present

## 2019-07-14 DIAGNOSIS — M5136 Other intervertebral disc degeneration, lumbar region: Secondary | ICD-10-CM | POA: Diagnosis not present

## 2019-07-14 DIAGNOSIS — J449 Chronic obstructive pulmonary disease, unspecified: Secondary | ICD-10-CM | POA: Diagnosis not present

## 2019-07-14 DIAGNOSIS — Z7901 Long term (current) use of anticoagulants: Secondary | ICD-10-CM | POA: Diagnosis not present

## 2019-07-14 DIAGNOSIS — Z8701 Personal history of pneumonia (recurrent): Secondary | ICD-10-CM | POA: Diagnosis not present

## 2019-07-14 DIAGNOSIS — E1140 Type 2 diabetes mellitus with diabetic neuropathy, unspecified: Secondary | ICD-10-CM | POA: Diagnosis not present

## 2019-07-14 DIAGNOSIS — I110 Hypertensive heart disease with heart failure: Secondary | ICD-10-CM | POA: Diagnosis not present

## 2019-07-14 DIAGNOSIS — Z7984 Long term (current) use of oral hypoglycemic drugs: Secondary | ICD-10-CM | POA: Diagnosis not present

## 2019-07-14 DIAGNOSIS — I5042 Chronic combined systolic (congestive) and diastolic (congestive) heart failure: Secondary | ICD-10-CM | POA: Diagnosis not present

## 2019-07-14 DIAGNOSIS — I340 Nonrheumatic mitral (valve) insufficiency: Secondary | ICD-10-CM | POA: Diagnosis not present

## 2019-07-14 DIAGNOSIS — K635 Polyp of colon: Secondary | ICD-10-CM | POA: Diagnosis not present

## 2019-07-14 DIAGNOSIS — Z9181 History of falling: Secondary | ICD-10-CM | POA: Diagnosis not present

## 2019-07-14 DIAGNOSIS — I83018 Varicose veins of right lower extremity with ulcer other part of lower leg: Secondary | ICD-10-CM | POA: Diagnosis not present

## 2019-07-14 DIAGNOSIS — I83028 Varicose veins of left lower extremity with ulcer other part of lower leg: Secondary | ICD-10-CM | POA: Diagnosis not present

## 2019-07-14 DIAGNOSIS — I480 Paroxysmal atrial fibrillation: Secondary | ICD-10-CM | POA: Diagnosis not present

## 2019-07-14 DIAGNOSIS — Z6841 Body Mass Index (BMI) 40.0 and over, adult: Secondary | ICD-10-CM | POA: Diagnosis not present

## 2019-07-14 DIAGNOSIS — L97811 Non-pressure chronic ulcer of other part of right lower leg limited to breakdown of skin: Secondary | ICD-10-CM | POA: Diagnosis not present

## 2019-07-14 DIAGNOSIS — Z86711 Personal history of pulmonary embolism: Secondary | ICD-10-CM | POA: Diagnosis not present

## 2019-07-14 DIAGNOSIS — G8929 Other chronic pain: Secondary | ICD-10-CM | POA: Diagnosis not present

## 2019-07-14 DIAGNOSIS — E1151 Type 2 diabetes mellitus with diabetic peripheral angiopathy without gangrene: Secondary | ICD-10-CM | POA: Diagnosis not present

## 2019-07-17 ENCOUNTER — Telehealth: Payer: HMO | Admitting: Cardiology

## 2019-07-19 ENCOUNTER — Other Ambulatory Visit: Payer: Self-pay

## 2019-07-19 DIAGNOSIS — I872 Venous insufficiency (chronic) (peripheral): Secondary | ICD-10-CM

## 2019-07-24 ENCOUNTER — Telehealth (HOSPITAL_COMMUNITY): Payer: Self-pay

## 2019-07-24 DIAGNOSIS — J449 Chronic obstructive pulmonary disease, unspecified: Secondary | ICD-10-CM | POA: Diagnosis not present

## 2019-07-24 DIAGNOSIS — M5136 Other intervertebral disc degeneration, lumbar region: Secondary | ICD-10-CM | POA: Diagnosis not present

## 2019-07-24 DIAGNOSIS — Z86711 Personal history of pulmonary embolism: Secondary | ICD-10-CM | POA: Diagnosis not present

## 2019-07-24 DIAGNOSIS — I83028 Varicose veins of left lower extremity with ulcer other part of lower leg: Secondary | ICD-10-CM | POA: Diagnosis not present

## 2019-07-24 DIAGNOSIS — Z9981 Dependence on supplemental oxygen: Secondary | ICD-10-CM | POA: Diagnosis not present

## 2019-07-24 DIAGNOSIS — L97811 Non-pressure chronic ulcer of other part of right lower leg limited to breakdown of skin: Secondary | ICD-10-CM | POA: Diagnosis not present

## 2019-07-24 DIAGNOSIS — G8929 Other chronic pain: Secondary | ICD-10-CM | POA: Diagnosis not present

## 2019-07-24 DIAGNOSIS — Z87891 Personal history of nicotine dependence: Secondary | ICD-10-CM | POA: Diagnosis not present

## 2019-07-24 DIAGNOSIS — L97821 Non-pressure chronic ulcer of other part of left lower leg limited to breakdown of skin: Secondary | ICD-10-CM | POA: Diagnosis not present

## 2019-07-24 DIAGNOSIS — I5042 Chronic combined systolic (congestive) and diastolic (congestive) heart failure: Secondary | ICD-10-CM | POA: Diagnosis not present

## 2019-07-24 DIAGNOSIS — I480 Paroxysmal atrial fibrillation: Secondary | ICD-10-CM | POA: Diagnosis not present

## 2019-07-24 DIAGNOSIS — Z6841 Body Mass Index (BMI) 40.0 and over, adult: Secondary | ICD-10-CM | POA: Diagnosis not present

## 2019-07-24 DIAGNOSIS — R0902 Hypoxemia: Secondary | ICD-10-CM | POA: Diagnosis not present

## 2019-07-24 DIAGNOSIS — Z7984 Long term (current) use of oral hypoglycemic drugs: Secondary | ICD-10-CM | POA: Diagnosis not present

## 2019-07-24 DIAGNOSIS — G4733 Obstructive sleep apnea (adult) (pediatric): Secondary | ICD-10-CM | POA: Diagnosis not present

## 2019-07-24 DIAGNOSIS — E1151 Type 2 diabetes mellitus with diabetic peripheral angiopathy without gangrene: Secondary | ICD-10-CM | POA: Diagnosis not present

## 2019-07-24 DIAGNOSIS — Z7901 Long term (current) use of anticoagulants: Secondary | ICD-10-CM | POA: Diagnosis not present

## 2019-07-24 DIAGNOSIS — I340 Nonrheumatic mitral (valve) insufficiency: Secondary | ICD-10-CM | POA: Diagnosis not present

## 2019-07-24 DIAGNOSIS — Z8701 Personal history of pneumonia (recurrent): Secondary | ICD-10-CM | POA: Diagnosis not present

## 2019-07-24 DIAGNOSIS — E1140 Type 2 diabetes mellitus with diabetic neuropathy, unspecified: Secondary | ICD-10-CM | POA: Diagnosis not present

## 2019-07-24 DIAGNOSIS — I110 Hypertensive heart disease with heart failure: Secondary | ICD-10-CM | POA: Diagnosis not present

## 2019-07-24 DIAGNOSIS — I83018 Varicose veins of right lower extremity with ulcer other part of lower leg: Secondary | ICD-10-CM | POA: Diagnosis not present

## 2019-07-24 DIAGNOSIS — K635 Polyp of colon: Secondary | ICD-10-CM | POA: Diagnosis not present

## 2019-07-24 DIAGNOSIS — Z9181 History of falling: Secondary | ICD-10-CM | POA: Diagnosis not present

## 2019-07-24 NOTE — Telephone Encounter (Signed)

## 2019-07-25 ENCOUNTER — Encounter: Payer: Self-pay | Admitting: Vascular Surgery

## 2019-07-25 ENCOUNTER — Other Ambulatory Visit: Payer: Self-pay

## 2019-07-25 ENCOUNTER — Ambulatory Visit (INDEPENDENT_AMBULATORY_CARE_PROVIDER_SITE_OTHER): Payer: HMO | Admitting: Vascular Surgery

## 2019-07-25 ENCOUNTER — Ambulatory Visit (HOSPITAL_COMMUNITY)
Admission: RE | Admit: 2019-07-25 | Discharge: 2019-07-25 | Disposition: A | Discharge status: Home / Self Care | Payer: HMO | Source: Ambulatory Visit | Attending: Vascular Surgery | Admitting: Vascular Surgery

## 2019-07-25 VITALS — BP 114/72 | HR 80 | Temp 96.8°F | Resp 18 | Ht 73.0 in | Wt 323.0 lb

## 2019-07-25 DIAGNOSIS — I872 Venous insufficiency (chronic) (peripheral): Secondary | ICD-10-CM

## 2019-07-25 NOTE — Progress Notes (Signed)
Patient name: Yunus L Spillers MRN: [id number] DOB: 23-Nov-1950 Sex: male  REASON FOR CONSULT: Initially evaluated for PVD, follow-up today after venous reflux study to evaluate for venous insufficiency  HPI: Terence LuxBernard L JarretTerence Luxt is a 68 y.o. male, with multiple medical problems including diabetes, A. fib on chronic anticoagulation, hypertension, COPD on home oxygen, diastolic heart failure that initially presented for evaluation of PVD and now follows up after venous reflux study.  Previously noted he reported that his feet have been discolored for several years.  Often appear blue and congested.  He has had multiple issues with bilateral shin ulcerations and presumed venous ulcers that have been managed with Unna boots for years.  When I initially evaluated him he stated his main issue was leg swelling.  We ultimately put him in Unna boots to his bilateral lower extremities and referred him to wound clinic.  He is now getting Radio broadcast assistantUnna boot changes with home health throughout the week.  His wounds have made significant progress and are healing.  Also has a left great toenail that is almost ready to fall off after some local trauma.  Past Medical History:  Diagnosis Date   Acute respiratory failure with hypoxia [facility](HCC)    Hospitalized with acute respiratory failure secondary to CAP, diastolic CHF, and pulmonary embolism   AKI (acute kidney injury) (HCC)    Allergy    allergic rhinitis   Altered mental status    Atrial fibrillation (HCC)    Chronic anticoagulation 03/26/2017   Failed Coumadin (PE with therapeutic INR). Now on Xarelto   Colon polyps    Community acquired pneumonia of right lower lobe of lung    COPD (chronic obstructive pulmonary disease) (HCC)    Degenerative disc disease, lumbar    Diabetes mellitus without complication (HCC)    History of pulmonary embolus (PE) 03/26/2017   Hypertension    Mitral regurgitation 12/26/2012   Obesity (BMI 30-39.9) 12/09/2012   PAF  (paroxysmal atrial fibrillation) (HCC)    DCCV in 2014 with recurrent PAF in setting of respiratory failure June 2018. S/P DCCV 04/13/17 to NSR   Persistent atrial fibrillation (HCC)    Pneumonia ~ 2016   Sepsis (HCC) 03/10/2017   Sleep apnea    Systolic murmur    Type II diabetes mellitus (HCC)     Past Surgical History:  Procedure Laterality Date   CARDIOVERSION N/A 01/09/2013   Procedure: CARDIOVERSION;  Surgeon: Pricilla RifflePaula V Ross, MD;  [facility];  Service: Cardiovascular;  Laterality: N/A;   CARDIOVERSION N/A 04/13/2017   Procedure: CARDIOVERSION;  Surgeon: Quintella Reicherturner, Traci R, MD;  Location: [facility] ENDOSCOPY;  Service: Cardiovascular;  Laterality: N/A;   TEE WITHOUT CARDIOVERSION N/A 01/09/2013   Procedure: TRANSESOPHAGEAL ECHOCARDIOGRAM (TEE);  Surgeon: Pricilla RifflePaula V Ross, MD;  [facility];  Service: Cardiovascular;  Laterality: N/A;    Family History  Problem Relation Age of Onset   Cancer Mother 4450       breast   Heart disease Father 5350       MI   Cancer Brother        lung   Stroke Brother 1445    SOCIAL HISTORY: Social History   Socioeconomic History   Marital status: Married    Spouse name: Not on file   Number of children: 2   Years of education: Not on file   Highest education level: Not on file  Occupational History    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  Social Needs   Financial resource  strain: Not on file   Food insecurity    Worry: Never true    Inability: Never true   Transportation needs    Medical: No    Non-medical: No  Tobacco Use   Smoking status: Former Smoker    Packs/day: 1.00    Years: 43.00    Pack years: 43.00    Types: Cigarettes    Quit date: 03/08/2017    Years since quitting: 2.3   Smokeless tobacco: Never Used  Substance and Sexual Activity   Alcohol use: No    Comment: Rare   Drug use: No   Sexual activity: Not Currently  Lifestyle   Physical activity    Days per week: Not on file    Minutes per session: Not  on file   Stress: Not on file  Relationships   Social connections    Talks on phone: Not on file    Gets together: Not on file    Attends religious service: Not on file    Active member of club or organization: Not on file    Attends meetings of clubs or organizations: Not on file    Relationship status: Not on file   Intimate partner violence    Fear of current or ex partner: Not on file    Emotionally abused: Not on file    Physically abused: Not on file    Forced sexual activity: Not on file  Other Topics Concern   Not on file  Social History Narrative   Not on file    No Known Allergies  Current Outpatient Medications  Medication Sig Dispense Refill   Dulaglutide (TRULICITY) 1.5 MG/0.5ML SOPN INJECT 1.5 MG INTO THE SKIN ONCE A WEEK 4 pen 3   furosemide (LASIX) 40 MG tablet Take 2 tablets (80 mg total) by mouth 2 (two) times daily. 360 tablet 3   metoprolol tartrate (LOPRESSOR) 50 MG tablet Take 1 tablet (50 mg total) by mouth 2 (two) times daily. 180 tablet 3   potassium chloride SA (K-DUR,KLOR-CON) 20 MEQ tablet Take 1 tablet (20 mEq total) by mouth daily. 90 tablet 3   pravastatin (PRAVACHOL) 20 MG tablet Take 1 tablet (20 mg total) by mouth daily. 90 tablet 3   rivaroxaban (XARELTO) 20 MG TABS tablet TAKE 1 TABLET BY MOUTH ONCE DAILY WITH SUPPER 90 tablet 3   sitaGLIPtin (JANUVIA) 100 MG tablet Take 1 tablet (100 mg total) by mouth daily. 90 tablet 2   No current facility-administered medications for this visit.     REVIEW OF SYSTEMS:  [X]  denotes positive finding, [ ]  denotes negative finding Cardiac  Comments:  Chest pain or chest pressure:    Shortness of breath upon exertion: x   Short of breath when lying flat: x   Irregular heart rhythm:        Vascular    Pain in calf, thigh, or hip brought on by ambulation:    Pain in feet at night that wakes you up from your sleep:     Blood clot in your veins:    Leg swelling:  x Bilateral      Pulmonary     Oxygen at home: x   Productive cough:  x   Wheezing:         Neurologic    Sudden weakness in arms or legs:     Sudden numbness in arms or legs:     Sudden onset of difficulty speaking or slurred speech:    Temporary  loss of vision in one eye:     Problems with dizziness:         Gastrointestinal    Blood in stool:     Vomited blood:         Genitourinary    Burning when urinating:     Blood in urine:        Psychiatric    Major depression:         Hematologic    Bleeding problems:    Problems with blood clotting too easily:        Skin    Rashes or ulcers:        Constitutional    Fever or chills:      PHYSICAL EXAM: Vitals:   07/25/19 1217  BP: 114/72  Pulse: 80  Resp: 18  Temp: (!) 96.8 F (36 C)  TempSrc: Temporal  SpO2: 99%  Weight: (!) 323 lb (146.5 kg)  Height: 6\' 1"  (1.854 m)    GENERAL: The patient is a well-nourished male, in no acute distress. The vital signs are documented above. CARDIAC: There is a regular rate and rhythm.  VASCULAR:  2+ DP pulses palpable bilaterally Congestion of both lower extremities with edema improved in Unna Boots Circumferential skin thickening bilaterally - open ulcerations bilaterally - nearly completely healed Left great toenail falling off PULMONARY: There is good air exchange bilaterally without wheezing or rales. ABDOMEN: Soft and non-tender with normal pitched bowel sounds.  MUSCULOSKELETAL: There are no major deformities or cyanosis. NEUROLOGIC: No focal weakness or paresthesias are detected. SKIN: There are no ulcers or rashes noted. PSYCHIATRIC: The patient has a normal affect.      DATA:   ABIs are 0.96 on the right triphasic and 1.09 on the left biphasic.  Toe pressures are 105 and 115 bilaterally with pulsatile toe tracings.  Venous reflux study showed no venous insufficiency in the right lower extremity.  The left lower extremity had abnormal reflux in the common femoral vein and great  saphenous vein in the mid to distal thigh where it measures greater than 5 mm.  Also has a segment of reflux in the small saphenous vein.  Assessment/Plan:  77-year male presents with fairly profound bilateral lower extremity leg swelling with ulcerations in both lower extremities (on the shins).  We previously evaluated him for arterial disease and did not feel he warranted any arterial intervention and has palpable DP pulses again today.  Wounds have shown significant progress with Unna boots.  We will continue Unna boots at this time.  His venous reflux study today shows no reflux in the right lower extremity.  He does have reflux in the left common femoral vein as well as the left great saphenous vein in the mid to distal thigh.  Discussed that his leg swelling and ulcerations may partly be related to his diastolic heart failure and other central medical issues given that no reflux was identified in the right leg.  I will bring him back in 3 months to see Dr. Oneida Alar just to reevaluate his left leg given this large great saphenous vein greater than 5 mm with reflux throughout the mid to distal thigh to see if any intervention in the left leg would be beneficial for him.  Discussed that when he is done with Unna boot once the wounds are completely healed he can go to thigh compression with 20-30 mmHg and leg elevation.  As it relates to his left great toenail that is about to  fall off, I think he has enough arterial perfusion to heal this.  Now that his edema significantly improved I can actually appreciate palpable dorsalis pedis pulses in both feet.  Adequate toe pressures for wound healing as well as previously noted with pulsatile toe tracings.  He can let us know if this does not heal.  Cephus Shelling, MD [facility] Office: [phone] Pager: [phone]

## 2019-07-27 ENCOUNTER — Ambulatory Visit (INDEPENDENT_AMBULATORY_CARE_PROVIDER_SITE_OTHER): Payer: HMO | Admitting: Family Medicine

## 2019-07-27 ENCOUNTER — Encounter: Payer: Self-pay | Admitting: Family Medicine

## 2019-07-27 ENCOUNTER — Other Ambulatory Visit: Payer: Self-pay

## 2019-07-27 VITALS — BP 126/74 | HR 94 | Temp 96.9°F | Resp 20 | Ht 73.0 in | Wt 325.0 lb

## 2019-07-27 DIAGNOSIS — L97209 Non-pressure chronic ulcer of unspecified calf with unspecified severity: Secondary | ICD-10-CM | POA: Diagnosis not present

## 2019-07-27 DIAGNOSIS — I83028 Varicose veins of left lower extremity with ulcer other part of lower leg: Secondary | ICD-10-CM

## 2019-07-27 DIAGNOSIS — E118 Type 2 diabetes mellitus with unspecified complications: Secondary | ICD-10-CM

## 2019-07-27 DIAGNOSIS — I83002 Varicose veins of unspecified lower extremity with ulcer of calf: Secondary | ICD-10-CM

## 2019-07-27 DIAGNOSIS — I5042 Chronic combined systolic (congestive) and diastolic (congestive) heart failure: Secondary | ICD-10-CM

## 2019-07-27 DIAGNOSIS — L97821 Non-pressure chronic ulcer of other part of left lower leg limited to breakdown of skin: Secondary | ICD-10-CM | POA: Diagnosis not present

## 2019-07-27 NOTE — Progress Notes (Addendum)
Subjective:    Patient ID: William Alvarado, male    DOB: July 10, 1951, 68 y.o.   MRN: [id number]  HPI  05/11/19 Patient went to the emergency room on August 16.  A varicose vein on the dorsum of his left foot suddenly ruptured and was bleeding profusely.  Patient went to the emergency room and the wound was treated with 3 sutures.  This area is examined today and the area is not bleeding.  The skin seems to be healing normally.  There is no evidence of cellulitis or secondary infection.  Patient is in bilateral Unna boots.  These are removed today.  He has a large bulla on the medial posterior right shin.  This ruptured when I remove the Unna boot and a large golf ball sized clear jellylike coagulated fluid was removed.  A similar ulcer ruptured on his medial posterior right ankle.  This clotted serous-like fluid was roughly the size of a grape.  These 2 areas were covered with Silvadene, nonadherent gauze, and then the right leg was replaced in an [employer].  He has varicose veins all throughout both legs.  The Unna boot was removed from his left leg.  This revealed the stitches on the dorsum of his left foot.  There was no ulcers or weeping edema on his left leg.  His left leg was then placed in an [employer] as well.  There was also a small superficial ulcer on the superior anterior portion of his right shin that was roughly the diameter of a silver dollar.  At that time, my plan was: Patient has an appointment to see the vascular surgeon given the varicose veins in his leg.  This was made for him by the emergency room.  In the meantime, I have recommended putting the patient back in Unna boots to facilitate the healing of the venous stasis ulcers seen in the right leg and demonstrated by the yellow circles on the drawing.  Each of the yellow circle areas was covered with Silvadene and nonadherent gauze and then the patient was put in an Unna boot from the toes to the knee.  Silvadene was applied to the  suture location on the dorsum of the left foot and covered with nonadherent gauze and then placed in an Unna boot as well.  Patient was instructed to return Monday to change the Unna boot dressings.  Sutures will be removed on Monday.  I suspect that the wounds on the right leg will take several weeks if not 2 months to heal.  Patient will require compressive dressings throughout that time to allow healing  05/15/19     Unna boot was removed from his right leg.  As the pictures above show, there is still a triangular-shaped ulcer over his medial shin.  This is shown in the second picture above.  There is also a triangular-shaped ulcer over his superior anterior lateral shin.  Both of these have improved dramatically since his last visit.  The swelling in his legs have also improved.  The 3 stitches were removed from the dorsum of his left foot.  He began to bleed from each of the suture locations.  This wound was then covered with Silvadene, nonadherent gauze, and the patient was placed back in an Unna boot in his left leg.  I believe that the pressure will stop the bleeding.  The 2 ulcers shown above in the photos were covered with Silvadene, nonadherent gauze, and the patient was placed  back in an Unna boot in his right leg.  His weight is up substantially. Wt Readings from Last 3 Encounters:  07/25/19 (!) 323 lb (146.5 kg)  07/03/19 (!) 327 lb (148.3 kg)  06/27/19 (!) 327 lb (148.3 kg)   He has bibasilar crackles in both lungs and also swelling above the Unna boots in both legs.  Patient denies any chest pain or shortness of breath.  He does admit to eating more recently.  At that time, my plan was: Please see the history of present illness, the patient was placed in Unna boots bilaterally.  I will see the patient back on Thursday and change the dressings until healed.  I did recommend that the patient use Zaroxolyn tomorrow morning prior to his Lasix to facilitate increased diuresis as he appears to  be fluid overloaded on exam.  Recheck on Thursday along with a BMP.  05/18/19 Patient has lost 9 pounds since I last saw him.  His breathing is better and the swelling in his legs has completely subsided.  His Unna boots were removed today.  The ulcer on the anterior right shin at the superior portion just below the patella has essentially healed.  The ulcer over the medial calf which was the largest is roughly 80% healed.  There is no evidence of secondary cellulitis.  The ulcer on his posterior right ankle is still about 50% healed.  Although better this is healing slower than the rest.  The Unna boot was removed from his left leg and there was no residual bleeding from where the sutures were removed.  The wound has healed well.  AT that time, my plan was: Wound has completely healed on the left foot.  Wounds are healing nicely on the right leg.  Unna boots were replaced bilaterally.  Recheck here Monday.  If he continues his current pace, I suspect that he will be resolved within 1 to 2 weeks.  05/22/19 The left foot has completely healed after the Unna boot was removed.  The Unna boot was removed from the right leg and the ulcer on the anterior right shin and also on the medial anterior right calf and shin have completely healed and re-epithelialized.  These look excellent.  He still has a very superficial ulcer on the posterior aspect of his right ankle just above the lateral malleolus.  This is healing nicely and has now almost completely filled in although it is yet to re-epithelialize.  05/25/19 Unna boot was removed from his right leg today.  The ulcer on his posterior right calf is completely healed.  There is no residual wound.  There is no evidence of secondary cellulitis.  There is no pitting edema.  The patient did bring his compression hose today.   At that time, my plan was: Ulcer has completely healed on the posterior right calf.  Unna boot was removed today from the right leg.  The wound was  cleaned and the patient was placed in his personal compression hose.  I recommended that he wear the compression hose on a daily basis.  Follow-up in November for fasting lab work or sooner if other problems arise.  Recommended compliance with compression hose to prevent leg swelling and ulcer formation in the future.  06/23/19 Patient was recently seen at the vein and vascular clinic for evaluation of his chronic venous insufficiency.  He was found to have recurrent venous stasis ulcers forming on both legs left worse than right.  On the superior  aspect of his right lateral shin there is a very superficial ulcer that is square in shape and roughly 4 cm x 4 cm.  There is a punctate small ulcer on the medial aspect of the middle right shin that is roughly 1.5 cm in diameter.  On the lateral aspect of the left shin just above the malleolus or 2 large ulcers.  One is 6 cm in diameter and circular.  Just below that is a ulcer that is 2.5 to 3 cm in diameter.  Both are very shallow with underlying exposed dermis.  At that time, my plan was: Patient has bilateral venous stasis ulcers of both shins as diagrammed above and described in the history of present illness.  Both his right and left leg were placed in Unna boots today.  Vein and vascular has recommended Unna boots weekly until he follows up with them in November.  Patient requires home health nursing to change in Unna boots.  He is a very high risk patient should he be exposed to COVID due to his chronic right-sided heart failure, COPD, and respiratory compromise coupled with morbid obesity and diabetes.  Therefore I would like to keep him out of the office as much as possible and reduce his risk of exposure.  I will see if I can arrange home health nursing to come by weekly and change his Unna boots.  I will see the patient back on Tuesday to replace the Unna boots applied today  06/27/19 Patient is here today for recheck.  The lesion over the superior  lateral portion of his left shin is essentially unchanged.  It is still a very large superficial ulcer down to the underlying dermis.  It consist of 2 ulcers that have coalesced into one complex.  One is 6 cm in diameter.  The other is 3 cm in diameter.  It is not weeping or bleeding.  There is no evidence of secondary cellulitis.  The area on the right lateral shin has improved.  It is now much smaller and is roughly the diameter of a quarter.  07/03/19 Lesions appear dramatically better.  Although they are still present, they are much more shallow.  They are essentially re-epithelialized.  They are less than a millimeter deep.  There is no evidence of cellulitis.  There is no weeping fluid.  Only the lesion on the anterior right shin still shows any skin breakdown.  Patient's legs feel much better.  At that time, my plan was:  Unna boots were reapplied to both legs.  Recheck here on Monday to repeat dressing changes.  Patient is also a diabetic and he is not on a statin.  Therefore I recommended starting pravastatin 20 mg a day due to his diabetes.  07/27/19 Patient saw vascular surgery yesterday.  I have read Dr. Ophelia Charterlark's note.  He is recommended follow-up in 3 months to reassess.  Patient was taken out of his antibiotics yesterday.  Over the last 24 hours he has developed a large serous filled blister on his right anterior upper shin simply from being out of the [employer] boots.  He also has a weeping ulcer forming on his posterior right calf.  Left leg today looks good.  However wife and patient are physically unable to apply compression hose.  Therefore unless I put on the Unna boots, the left leg will start to swell again as well.  Home health nursing cannot come out today to reapply the Unna boots and therefore they are  requesting that I do it.  Also the toenail has partially avulsed from his left great toe.  The nail is white and opaque and is no longer adherent to the underlying nailbed distally.  It is  still somewhat adherent approximately.  There is clear yellow drainage coming from under the toenail.  There is no erythema or warmth of the toe to suggest an infection.  However the toenail does not appear to be salvageable and will likely slowly fall off on its own.  Patient is overdue to recheck his diabetes test today. Past Medical History:  Diagnosis Date   Acute respiratory failure with hypoxia [facility])    Hospitalized with acute respiratory failure secondary to CAP, diastolic CHF, and pulmonary embolism   AKI (acute kidney injury) (HCC)    Allergy    allergic rhinitis   Altered mental status    Atrial fibrillation (HCC)    Chronic anticoagulation 03/26/2017   Failed Coumadin (PE with therapeutic INR). Now on Xarelto   Colon polyps    Community acquired pneumonia of right lower lobe of lung    COPD (chronic obstructive pulmonary disease) (HCC)    Degenerative disc disease, lumbar    Diabetes mellitus without complication (HCC)    History of pulmonary embolus (PE) 03/26/2017   Hypertension    Mitral regurgitation 12/26/2012   Obesity (BMI 30-39.9) 12/09/2012   PAF (paroxysmal atrial fibrillation) (HCC)    DCCV in 2014 with recurrent PAF in setting of respiratory failure June 2018. S/P DCCV 04/13/17 to NSR   Persistent atrial fibrillation (HCC)    Pneumonia ~ 2016   Sepsis (HCC) 03/10/2017   Sleep apnea    Systolic murmur    Type II diabetes mellitus (HCC)    Past Surgical History:  Procedure Laterality Date   CARDIOVERSION N/A 01/09/2013   Procedure: CARDIOVERSION;  Surgeon: Pricilla Riffle, MD;  Location[facility] ENDOSCOPY;  Service: Cardiovascular;  Laterality: N/A;   CARDIOVERSION N/A 04/13/2017   Procedure: CARDIOVERSION;  Surgeon: Quintella Reichert, MD;  Location: [facility] ENDOSCOPY;  Service: Cardiovascular;  Laterality: N/A;   TEE WITHOUT CARDIOVERSION N/A 01/09/2013   Procedure: TRANSESOPHAGEAL ECHOCARDIOGRAM (TEE);  Surgeon: Pricilla Riffle, MD;  Location: [facility] ENDOSCOPY;   Service: Cardiovascular;  Laterality: N/A;   Current Outpatient Medications on File Prior to Visit  Medication Sig Dispense Refill   Dulaglutide (TRULICITY) 1.5 MG/0.5ML SOPN INJECT 1.5 MG INTO THE SKIN ONCE A WEEK 4 pen 3   furosemide (LASIX) 40 MG tablet Take 2 tablets (80 mg total) by mouth 2 (two) times daily. 360 tablet 3   metoprolol tartrate (LOPRESSOR) 50 MG tablet Take 1 tablet (50 mg total) by mouth 2 (two) times daily. 180 tablet 3   potassium chloride SA (K-DUR,KLOR-CON) 20 MEQ tablet Take 1 tablet (20 mEq total) by mouth daily. 90 tablet 3   pravastatin (PRAVACHOL) 20 MG tablet Take 1 tablet (20 mg total) by mouth daily. 90 tablet 3   rivaroxaban (XARELTO) 20 MG TABS tablet TAKE 1 TABLET BY MOUTH ONCE DAILY WITH SUPPER 90 tablet 3   sitaGLIPtin (JANUVIA) 100 MG tablet Take 1 tablet (100 mg total) by mouth daily. 90 tablet 2   No current facility-administered medications on file prior to visit.    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 strain: Not on file   Food insecurity    Worry: Never true    Inability: Never true   Transportation needs    Medical: No    Non-medical: No  Tobacco Use   Smoking status: Former Smoker    Packs/day: 1.00    Years: 43.00    Pack years: 43.00    Types: Cigarettes    Quit date: 03/08/2017    Years since quitting: 2.3   Smokeless tobacco: Never Used  Substance and Sexual Activity   Alcohol use: No    Comment: Rare   Drug use: No   Sexual activity: Not Currently  Lifestyle   Physical activity    Days per week: Not on file    Minutes per session: Not on file   Stress: Not on file  Relationships   Social connections    Talks on phone: Not on file    Gets  together: Not on file    Attends religious service: Not on file    Active member of club or organization: Not on file    Attends meetings of clubs or organizations: Not on file    Relationship status: Not on file   Intimate partner violence    Fear of current or ex partner: Not on file    Emotionally abused: Not on file    Physically abused: Not on file    Forced sexual activity: Not on file  Other Topics Concern   Not on file  Social History Narrative   Not on file      Review of Systems  All other systems reviewed and are negative.      Objective:   Physical Exam Vitals signs reviewed.  Constitutional:      Appearance: He is obese.  Cardiovascular:     Rate and Rhythm: Normal rate and regular rhythm.     Heart sounds: Normal heart sounds.  Pulmonary:     Effort: Pulmonary effort is normal.     Breath sounds: No stridor. Wheezing present. No rhonchi or rales.  Musculoskeletal:     Right lower leg: He exhibits no tenderness, no swelling, no deformity and no laceration. Edema present.     Left lower leg: He exhibits laceration. Edema present.       Legs:  Skin:    Findings: Lesion present.  Neurological:     Mental Status: He is alert.           Assessment & Plan:   Controlled type 2 diabetes mellitus with complication, without long-term current use of insulin (HCC) - Plan: Hemoglobin A1c, CBC with Differential, COMPLETE METABOLIC PANEL WITH GFR, LDL Cholesterol, Direct  Venous stasis ulcer of calf, unspecified laterality, unspecified ulcer stage, unspecified whether varicose veins present (HCC)  Venous stasis ulcer of other part of left lower leg limited to breakdown of skin with varicose veins (HCC)  Chronic combined systolic and diastolic congestive heart failure (HCC)  Patient was placed in Unna boots bilaterally.  The ulcers on the right leg were covered with Silvadene and nonadherent gauze.  Both legs were placed in Unna boots after that.  Home  health nursing will resume Unna boots starting next week.  However I do believe the patient appears to be fluid overloaded on exam.  Therefore I recommended that they start using metolazone every Monday on a scheduled basis.  I believe the patient shows signs  of decompensated right-sided heart failure secondary to morbid obesity, hypoventilation syndrome, and COPD.  Therefore I believe the increased diuretic dosage is necessary.  He will do the metolazone every Monday on a scheduled basis and continue his Lasix 80 mg twice daily.  May need to increase the frequency of the metolazone even further if this does not improve his fluid status.  While the patient is here I will check his renal function along with his hemoglobin A1c.  Patient can return at any time to have the toenail removed but he declines that today.  Patient is also wheezing on exam.  He reports a cough productive of clear mucus.  I believe his COPD is contributing to his right-sided heart failure.  Therefore I will add Stiolto 2 inhalations daily.  I gave the patient samples sufficient for 1 month.  If this is beneficial we can certainly refill this in the future.  He tried Trelegy in the past and I am not sure why he discontinue the medication.  I suspect cost

## 2019-07-28 LAB — COMPLETE METABOLIC PANEL WITH GFR
AG Ratio: 1 (calc) (ref 1.0–2.5)
ALT: 8 U/L — ABNORMAL LOW (ref 9–46)
AST: 14 U/L (ref 10–35)
Albumin: 3.5 g/dL — ABNORMAL LOW (ref 3.6–5.1)
Alkaline phosphatase (APISO): 73 U/L (ref 35–144)
BUN/Creatinine Ratio: 17 (calc) (ref 6–22)
BUN: 27 mg/dL — ABNORMAL HIGH (ref 7–25)
CO2: 36 mmol/L — ABNORMAL HIGH (ref 20–32)
Calcium: 8.5 mg/dL — ABNORMAL LOW (ref 8.6–10.3)
Chloride: 92 mmol/L — ABNORMAL LOW (ref 98–110)
Creat: 1.62 mg/dL — ABNORMAL HIGH (ref 0.70–1.25)
GFR, Est African American: 51 mL/min/{1.73_m2} — ABNORMAL LOW (ref >=60)
GFR, Est Non African American: 44 mL/min/{1.73_m2} — ABNORMAL LOW (ref >=60)
Globulin: 3.5 g/dL (calc) (ref 1.9–3.7)
Glucose, Bld: 144 mg/dL — ABNORMAL HIGH (ref 65–99)
Potassium: 4.3 mmol/L (ref 3.5–5.3)
Sodium: 137 mmol/L (ref 135–146)
Total Bilirubin: 0.7 mg/dL (ref 0.2–1.2)
Total Protein: 7 g/dL (ref 6.1–8.1)

## 2019-07-28 LAB — CBC WITH DIFFERENTIAL/PLATELET
Absolute Monocytes: 986 cells/uL — ABNORMAL HIGH (ref 200–950)
Basophils Absolute: 56 cells/uL (ref 0–200)
Basophils Relative: 0.6 %
Eosinophils Absolute: 130 cells/uL (ref 15–500)
Eosinophils Relative: 1.4 %
HCT: 41.6 % (ref 38.5–50.0)
Hemoglobin: 12.3 g/dL — ABNORMAL LOW (ref 13.2–17.1)
Lymphs Abs: 521 cells/uL — ABNORMAL LOW (ref 850–3900)
MCH: 25.1 pg — ABNORMAL LOW (ref 27.0–33.0)
MCHC: 29.6 g/dL — ABNORMAL LOW (ref 32.0–36.0)
MCV: 84.9 fL (ref 80.0–100.0)
MPV: 9.7 fL (ref 7.5–12.5)
Monocytes Relative: 10.6 %
Neutro Abs: 7607 cells/uL (ref 1500–7800)
Neutrophils Relative %: 81.8 %
Platelets: 343 10*3/uL (ref 140–400)
RBC: 4.9 10*6/uL (ref 4.20–5.80)
RDW: 16.6 % — ABNORMAL HIGH (ref 11.0–15.0)
Total Lymphocyte: 5.6 %
WBC: 9.3 10*3/uL (ref 3.8–10.8)

## 2019-07-28 LAB — HEMOGLOBIN A1C
Hgb A1c MFr Bld: 6.9 % of total Hgb — ABNORMAL HIGH (ref <5.7)
Mean Plasma Glucose: 151 (calc)
eAG (mmol/L): 8.4 (calc)

## 2019-07-28 LAB — LDL CHOLESTEROL, DIRECT: Direct LDL: 38 mg/dL (ref <100)

## 2019-08-08 DIAGNOSIS — I480 Paroxysmal atrial fibrillation: Secondary | ICD-10-CM | POA: Diagnosis not present

## 2019-08-08 DIAGNOSIS — Z9181 History of falling: Secondary | ICD-10-CM | POA: Diagnosis not present

## 2019-08-08 DIAGNOSIS — I5042 Chronic combined systolic (congestive) and diastolic (congestive) heart failure: Secondary | ICD-10-CM | POA: Diagnosis not present

## 2019-08-08 DIAGNOSIS — G4733 Obstructive sleep apnea (adult) (pediatric): Secondary | ICD-10-CM | POA: Diagnosis not present

## 2019-08-08 DIAGNOSIS — I110 Hypertensive heart disease with heart failure: Secondary | ICD-10-CM | POA: Diagnosis not present

## 2019-08-08 DIAGNOSIS — E1140 Type 2 diabetes mellitus with diabetic neuropathy, unspecified: Secondary | ICD-10-CM | POA: Diagnosis not present

## 2019-08-08 DIAGNOSIS — K635 Polyp of colon: Secondary | ICD-10-CM | POA: Diagnosis not present

## 2019-08-08 DIAGNOSIS — I340 Nonrheumatic mitral (valve) insufficiency: Secondary | ICD-10-CM | POA: Diagnosis not present

## 2019-08-08 DIAGNOSIS — E1151 Type 2 diabetes mellitus with diabetic peripheral angiopathy without gangrene: Secondary | ICD-10-CM | POA: Diagnosis not present

## 2019-08-08 DIAGNOSIS — Z7984 Long term (current) use of oral hypoglycemic drugs: Secondary | ICD-10-CM | POA: Diagnosis not present

## 2019-08-08 DIAGNOSIS — Z7901 Long term (current) use of anticoagulants: Secondary | ICD-10-CM | POA: Diagnosis not present

## 2019-08-08 DIAGNOSIS — I83028 Varicose veins of left lower extremity with ulcer other part of lower leg: Secondary | ICD-10-CM | POA: Diagnosis not present

## 2019-08-08 DIAGNOSIS — L97821 Non-pressure chronic ulcer of other part of left lower leg limited to breakdown of skin: Secondary | ICD-10-CM | POA: Diagnosis not present

## 2019-08-08 DIAGNOSIS — Z86711 Personal history of pulmonary embolism: Secondary | ICD-10-CM | POA: Diagnosis not present

## 2019-08-08 DIAGNOSIS — J449 Chronic obstructive pulmonary disease, unspecified: Secondary | ICD-10-CM | POA: Diagnosis not present

## 2019-08-08 DIAGNOSIS — M5136 Other intervertebral disc degeneration, lumbar region: Secondary | ICD-10-CM | POA: Diagnosis not present

## 2019-08-08 DIAGNOSIS — I83018 Varicose veins of right lower extremity with ulcer other part of lower leg: Secondary | ICD-10-CM | POA: Diagnosis not present

## 2019-08-08 DIAGNOSIS — Z9981 Dependence on supplemental oxygen: Secondary | ICD-10-CM | POA: Diagnosis not present

## 2019-08-08 DIAGNOSIS — Z8701 Personal history of pneumonia (recurrent): Secondary | ICD-10-CM | POA: Diagnosis not present

## 2019-08-08 DIAGNOSIS — L97811 Non-pressure chronic ulcer of other part of right lower leg limited to breakdown of skin: Secondary | ICD-10-CM | POA: Diagnosis not present

## 2019-08-08 DIAGNOSIS — G8929 Other chronic pain: Secondary | ICD-10-CM | POA: Diagnosis not present

## 2019-08-08 DIAGNOSIS — Z87891 Personal history of nicotine dependence: Secondary | ICD-10-CM | POA: Diagnosis not present

## 2019-08-08 DIAGNOSIS — Z6841 Body Mass Index (BMI) 40.0 and over, adult: Secondary | ICD-10-CM | POA: Diagnosis not present

## 2019-08-14 ENCOUNTER — Other Ambulatory Visit: Payer: Self-pay | Admitting: Family Medicine

## 2019-08-14 NOTE — Progress Notes (Signed)
HPI: Fuparoxysmal atrial fibrillation; has had previous cardioversions. CTA June 2018 showed small nonocclusive pulmonary embolus. Placed on anticoagulation. Echocardiogram August 2019 showed normal LV systolic function, D-shaped septum, mild mitral regurgitation, moderate biatrial enlargement, mild right ventricular enlargement.  ABI September 2020 normal.  Lower extremity venous Dopplers November 2020 showed no DVT.  Since last seen,  he notes increased dyspnea on exertion.  He has gained weight and notices increased bilateral lower extremity edema.  He denies chest pain, palpitations or syncope.  No bleeding.  Current Outpatient Medications  Medication Sig Dispense Refill  . cephALEXin (KEFLEX) 500 MG capsule Take 1 capsule (500 mg total) by mouth 2 (two) times daily. 10 capsule 0  . Dulaglutide (TRULICITY) 1.5 MG/0.5ML SOPN INJECT 1.5 MG INTO THE SKIN ONCE A WEEK 4 pen 3  . furosemide (LASIX) 40 MG tablet Take 2 tablets (80 mg total) by mouth 2 (two) times daily. 360 tablet 3  . metoprolol tartrate (LOPRESSOR) 50 MG tablet Take 1 tablet by mouth twice daily 180 tablet 0  . potassium chloride SA (K-DUR,KLOR-CON) 20 MEQ tablet Take 1 tablet (20 mEq total) by mouth daily. 90 tablet 3  . pravastatin (PRAVACHOL) 20 MG tablet Take 1 tablet (20 mg total) by mouth daily. 90 tablet 3  . rivaroxaban (XARELTO) 20 MG TABS tablet TAKE 1 TABLET BY MOUTH ONCE DAILY WITH SUPPER 90 tablet 3  . sitaGLIPtin (JANUVIA) 100 MG tablet Take 1 tablet (100 mg total) by mouth daily. 90 tablet 2   No current facility-administered medications for this visit.      Past Medical History:  Diagnosis Date  . Acute respiratory failure with hypoxia (HCC)    Hospitalized with acute respiratory failure secondary to CAP, diastolic CHF, and pulmonary embolism  . AKI (acute kidney injury) (HCC)   . Allergy    allergic rhinitis  . Altered mental status   . Atrial fibrillation (HCC)   . Chronic anticoagulation  03/26/2017   Failed Coumadin (PE with therapeutic INR). Now on Xarelto  . Colon polyps   . Community acquired pneumonia of right lower lobe of lung   . COPD (chronic obstructive pulmonary disease) (HCC)   . Degenerative disc disease, lumbar   . Diabetes mellitus without complication (HCC)   . History of pulmonary embolus (PE) 03/26/2017  . Hypertension   . Mitral regurgitation 12/26/2012  . Obesity (BMI 30-39.9) 12/09/2012  . PAF (paroxysmal atrial fibrillation) (HCC)    DCCV in 2014 with recurrent PAF in setting of respiratory failure June 2018. S/P DCCV 04/13/17 to NSR  . Persistent atrial fibrillation (HCC)   . Pneumonia ~ 2016  . Sepsis (HCC) 03/10/2017  . Sleep apnea   . Systolic murmur   . Type II diabetes mellitus (HCC)     Past Surgical History:  Procedure Laterality Date  . CARDIOVERSION N/A 01/09/2013   Procedure: CARDIOVERSION;  Surgeon: Pricilla Riffle, MD;  Location: [facility] ENDOSCOPY;  Service: Cardiovascular;  Laterality: N/A;  . CARDIOVERSION N/A 04/13/2017   Procedure: CARDIOVERSION;  Surgeon: Quintella Reichert, MD;  Location: [facility] ENDOSCOPY;  Service: Cardiovascular;  Laterality: N/A;  . TEE WITHOUT CARDIOVERSION N/A 01/09/2013   Procedure: TRANSESOPHAGEAL ECHOCARDIOGRAM (TEE);  Surgeon: Pricilla Riffle, MD;  Location: [facility] ENDOSCOPY;  Service: Cardiovascular;  Laterality: N/A;    Social History   Socioeconomic History  . Marital status: Married    Spouse name: Not on file  . Number of children: 2  . Years of education: Not  on file  . Highest education level: Not on file  Occupational History    Comment: Machinist  Social Needs  . Financial resource strain: Not on file  . Food insecurity    Worry: Never true    Inability: Never true  . Transportation needs    Medical: No    Non-medical: No  Tobacco Use  . Smoking status: Former Smoker    Packs/day: 1.00    Years: 43.00    Pack years: 43.00    Types: Cigarettes    Quit date: 03/08/2017    Years since quitting: 2.4  .  Smokeless tobacco: Never Used  Substance and Sexual Activity  . Alcohol use: No    Comment: Rare  . Drug use: No  . Sexual activity: Not Currently  Lifestyle  . Physical activity    Days per week: Not on file    Minutes per session: Not on file  . Stress: Not on file  Relationships  . Social Musician on phone: Not on file    Gets together: Not on file    Attends religious service: Not on file    Active member of club or organization: Not on file    Attends meetings of clubs or organizations: Not on file    Relationship status: Not on file  . Intimate partner violence    Fear of current or ex partner: Not on file    Emotionally abused: Not on file    Physically abused: Not on file    Forced sexual activity: Not on file  Other Topics Concern  . Not on file  Social History Narrative  . Not on file    Family History  Problem Relation Age of Onset  . Cancer Mother 78       breast  . Heart disease Father 52       MI  . Cancer Brother        lung  . Stroke Brother 45    ROS: no fevers or chills, productive cough, hemoptysis, dysphasia, odynophagia, melena, hematochezia, dysuria, hematuria, rash, seizure activity, claudication. Remaining systems are negative.  Physical Exam: Well-developed morbidly obese in no acute distress.  Skin is warm and dry.  HEENT is normal.  Neck is supple.  Chest is clear to auscultation with normal expansion.  Cardiovascular exam is irregular, 2/6 systolic murmur. Abdominal exam nontender or distended. No masses palpated.  Positive abdominal wall edema Extremities show 3+ edema.  Wraps in place. neuro grossly intact  ECG- Atrial fibrillation, cannot R/O anterior MI; personally reviewed  A/P  1 persistent atrial fibrillation-patient is in atrial fibrillation today.  He has been in atrial fibrillation for quite some time.  Not clear to me that this is contributing to increased volume excess but certainly could be.  However also  not clear to me that he will hold sinus rhythm as she does have sleep apnea that is not treated (he states he could not tolerate CPAP).  Continue beta-blocker.  Continue Xarelto at present dose.  He is volume overloaded today.  We will try and diurese.  We can consider attempt at cardioversion if he does not improve with higher dose diuretics.  2 hypertension-blood pressure controlled.  Continue present medications and follow.  3 history of mild aortic and mitral stenosis-not evident on most recent echocardiogram.  4 chronic diastolic congestive heart failure-significantly volume overloaded.  Discontinue Lasix.  Add Demadex 40 mg twice daily.  Increase potassium to 20 mEq  p.o. twice daily.  Check potassium and renal function in 1 week.  I will have him see one of our APP's early next week to make sure he is improving.  May need hospitalization for IV diuresis.  Unclear to me if atrial fibrillation is contributing as he has been in atrial fibrillation for quite some time.  Can consider attempt at cardioversion if he does not improve with diuresis.  5 prior pulmonary embolus-patient will continue on anticoagulation predominantly for his atrial fibrillation.  6 morbid obesity-we discussed the importance of diet and weight loss.  Kirk Ruths, MD

## 2019-08-18 DIAGNOSIS — Z9981 Dependence on supplemental oxygen: Secondary | ICD-10-CM | POA: Diagnosis not present

## 2019-08-18 DIAGNOSIS — I110 Hypertensive heart disease with heart failure: Secondary | ICD-10-CM | POA: Diagnosis not present

## 2019-08-18 DIAGNOSIS — M5136 Other intervertebral disc degeneration, lumbar region: Secondary | ICD-10-CM | POA: Diagnosis not present

## 2019-08-18 DIAGNOSIS — Z8701 Personal history of pneumonia (recurrent): Secondary | ICD-10-CM | POA: Diagnosis not present

## 2019-08-18 DIAGNOSIS — Z87891 Personal history of nicotine dependence: Secondary | ICD-10-CM | POA: Diagnosis not present

## 2019-08-18 DIAGNOSIS — G8929 Other chronic pain: Secondary | ICD-10-CM | POA: Diagnosis not present

## 2019-08-18 DIAGNOSIS — Z86711 Personal history of pulmonary embolism: Secondary | ICD-10-CM | POA: Diagnosis not present

## 2019-08-18 DIAGNOSIS — L97821 Non-pressure chronic ulcer of other part of left lower leg limited to breakdown of skin: Secondary | ICD-10-CM | POA: Diagnosis not present

## 2019-08-18 DIAGNOSIS — K635 Polyp of colon: Secondary | ICD-10-CM | POA: Diagnosis not present

## 2019-08-18 DIAGNOSIS — I83028 Varicose veins of left lower extremity with ulcer other part of lower leg: Secondary | ICD-10-CM | POA: Diagnosis not present

## 2019-08-18 DIAGNOSIS — Z7901 Long term (current) use of anticoagulants: Secondary | ICD-10-CM | POA: Diagnosis not present

## 2019-08-18 DIAGNOSIS — I340 Nonrheumatic mitral (valve) insufficiency: Secondary | ICD-10-CM | POA: Diagnosis not present

## 2019-08-18 DIAGNOSIS — E1140 Type 2 diabetes mellitus with diabetic neuropathy, unspecified: Secondary | ICD-10-CM | POA: Diagnosis not present

## 2019-08-18 DIAGNOSIS — E1151 Type 2 diabetes mellitus with diabetic peripheral angiopathy without gangrene: Secondary | ICD-10-CM | POA: Diagnosis not present

## 2019-08-18 DIAGNOSIS — Z6841 Body Mass Index (BMI) 40.0 and over, adult: Secondary | ICD-10-CM | POA: Diagnosis not present

## 2019-08-18 DIAGNOSIS — G4733 Obstructive sleep apnea (adult) (pediatric): Secondary | ICD-10-CM | POA: Diagnosis not present

## 2019-08-18 DIAGNOSIS — Z9181 History of falling: Secondary | ICD-10-CM | POA: Diagnosis not present

## 2019-08-18 DIAGNOSIS — I5042 Chronic combined systolic (congestive) and diastolic (congestive) heart failure: Secondary | ICD-10-CM | POA: Diagnosis not present

## 2019-08-18 DIAGNOSIS — J449 Chronic obstructive pulmonary disease, unspecified: Secondary | ICD-10-CM | POA: Diagnosis not present

## 2019-08-18 DIAGNOSIS — I480 Paroxysmal atrial fibrillation: Secondary | ICD-10-CM | POA: Diagnosis not present

## 2019-08-18 DIAGNOSIS — L97811 Non-pressure chronic ulcer of other part of right lower leg limited to breakdown of skin: Secondary | ICD-10-CM | POA: Diagnosis not present

## 2019-08-18 DIAGNOSIS — Z7984 Long term (current) use of oral hypoglycemic drugs: Secondary | ICD-10-CM | POA: Diagnosis not present

## 2019-08-18 DIAGNOSIS — I83018 Varicose veins of right lower extremity with ulcer other part of lower leg: Secondary | ICD-10-CM | POA: Diagnosis not present

## 2019-08-21 ENCOUNTER — Other Ambulatory Visit: Payer: Self-pay

## 2019-08-21 ENCOUNTER — Ambulatory Visit (INDEPENDENT_AMBULATORY_CARE_PROVIDER_SITE_OTHER): Payer: HMO | Admitting: Family Medicine

## 2019-08-21 ENCOUNTER — Encounter: Payer: Self-pay | Admitting: Family Medicine

## 2019-08-21 VITALS — BP 128/68 | HR 88 | Temp 97.9°F | Resp 18 | Ht 73.0 in | Wt 317.0 lb

## 2019-08-21 DIAGNOSIS — S91202A Unspecified open wound of left great toe with damage to nail, initial encounter: Secondary | ICD-10-CM | POA: Diagnosis not present

## 2019-08-21 DIAGNOSIS — L97219 Non-pressure chronic ulcer of right calf with unspecified severity: Secondary | ICD-10-CM

## 2019-08-21 DIAGNOSIS — I872 Venous insufficiency (chronic) (peripheral): Secondary | ICD-10-CM | POA: Diagnosis not present

## 2019-08-21 DIAGNOSIS — L03032 Cellulitis of left toe: Secondary | ICD-10-CM

## 2019-08-21 DIAGNOSIS — L97229 Non-pressure chronic ulcer of left calf with unspecified severity: Secondary | ICD-10-CM

## 2019-08-21 DIAGNOSIS — I739 Peripheral vascular disease, unspecified: Secondary | ICD-10-CM | POA: Diagnosis not present

## 2019-08-21 DIAGNOSIS — S91209A Unspecified open wound of unspecified toe(s) with damage to nail, initial encounter: Secondary | ICD-10-CM

## 2019-08-21 MED ORDER — CEPHALEXIN 500 MG PO CAPS: 500.0000 mg | ORAL_CAPSULE | Freq: Two times a day (BID) | ORAL | 0 refills | Status: DC

## 2019-08-21 NOTE — Assessment & Plan Note (Signed)
Patient with severe peripheral vascular disease along with chronic venous stasis.  Unna boot removed today he is getting improvement he does not have any weeping no longer has any ulcerations of the skin will replace today he will come back on Friday to have these removed we will work on the home health situation.  Regarding the infected nail status post an avulsion at some point in the past couple months.  Since he has minimal feeling in his feet unclear when this may have occurred but it is infected at the nail bed.  Discussed with patient as well as his wife that we need to remove the remainder of the nail he agrees to do so.  Most of this was already lifted up so I used a hemostat to remove the nail most of it crumbled until we got to the lateral lower part of the left great toenail.  I used an elevator to lift up this part of the nail which was also ingrown.  Hemostat was used to grasp the remainder of the nail and removed from the bed.  He had avulsion of the tissue of the nail bed with bleeding.  He is on Xarelto.  Nail was cleaned with saline solution.  Vaseline gauze along with pressure bandage was applied to the great toe.  They are to remove this tomorrow cleanse and then rewrap.  Expect some constant oozing the setting of his vascular disease and his Xarelto.  For the underlying infection we will place him on Keflex 500 mg twice a day 5 days.

## 2019-08-21 NOTE — Patient Instructions (Signed)
F/U Friday Dr. Dennard Schaumann  for [employer]

## 2019-08-21 NOTE — Progress Notes (Signed)
Subjective:    Patient ID: William Alvarado, male    DOB: 12-13-1950, 68 y.o.   MRN: [id number]  Patient presents for Pollie Friar (states that [facility] has not been able to be approved through insurance- boots placed on 11/27 by [facility])  Left great toenail, he hit it some time ago, the nail is partially avulsed, has drainage around the nail, he cant feel ,so no pain, they have been putting a baindaid and antibiotic ointment on it.  He states that he thought the nail was then a come off on its own.  Peripheral vascular disease with chronic venous stasis changes.  He has been getting Unna boots for the past couple months.  There is some hold-up with the home health authorization so he needs to come to our office in the meantime to get into be changed twice a week.  No new concerns otherwise.  They do feel like the boots are helping.  He has follow-up with cardiology tomorrow.   Review Of Systems:  GEN- denies fatigue, fever, weight loss,weakness, recent illness HEENT- denies eye drainage, change in vision, nasal discharge, CVS- denies chest pain, palpitations RESP- denies SOB, cough, wheeze MSK- denies joint pain, muscle aches, injury Neuro- denies headache, dizziness, syncope, seizure activity       Objective:    BP 128/68   Pulse 88   Temp 97.9 F (36.6 C) (Temporal)   Resp 18   Ht 6\' 1"  (1.854 m)   Wt (!) 317 lb (143.8 kg)   SpO2 99% Comment: 2 L/min via [place]  BMI 41.82 kg/m  GEN- NAD, alert and oriented x3 CVS- RRR, no murmur RESP-normal work of breathing at rest, 2 L of oxygen bilateral scattered wheeze ABD-NABS,soft,NT,ND EXT-chronic venous stasis changes with reddish hue to bilateral legs flaking skin. Pulses- Radial 2+ dorsalis pedis diminished Left great toenail pulled apart from the nailbed with the exception of the lateral corner still connected to the base.  Nontender.  Odor as well as yellow drainage from beneath the nail. Second digit small subungual hematoma nontender  still attached        Assessment & Plan:      Problem List Items Addressed This Visit      Unprioritized   Chronic venous insufficiency   PVD (peripheral vascular disease) ([place])    Patient with severe peripheral vascular disease along with chronic venous stasis.  Unna boot removed today he is getting improvement he does not have any weeping no longer has any ulcerations of the skin will replace today he will come back on Friday to have these removed we will work on the home health situation.  Regarding the infected nail status post an avulsion at some point in the past couple months.  Since he has minimal feeling in his feet unclear when this may have occurred but it is infected at the nail bed.  Discussed with patient as well as his wife that we need to remove the remainder of the nail he agrees to do so.  Most of this was already lifted up so I used a hemostat to remove the nail most of it crumbled until we got to the lateral lower part of the left great toenail.  I used an elevator to lift up this part of the nail which was also ingrown.  Hemostat was used to grasp the remainder of the nail and removed from the bed.  He had avulsion of the tissue of the nail bed with bleeding.  He is on Xarelto.  Nail was cleaned with saline solution.  Vaseline gauze along with pressure bandage was applied to the great toe.  They are to remove this tomorrow cleanse and then rewrap.  Expect some constant oozing the setting of his vascular disease and his Xarelto.  For the underlying infection we will place him on Keflex 500 mg twice a day 5 days.       Other Visit Diagnoses    Infection of nail bed of toe of left foot    -  Primary   Relevant Medications   cephALEXin (KEFLEX) 500 MG capsule   Avulsion of toenail, initial encounter          Note: This dictation was prepared with Dragon dictation along with smaller phrase technology. Any transcriptional errors that result from this process are unintentional.

## 2019-08-21 NOTE — Addendum Note (Signed)
Addended by: Vic Blackbird F on: 08/21/2019 01:48 PM   Modules accepted: Level of Service

## 2019-08-22 ENCOUNTER — Encounter: Payer: Self-pay | Admitting: Cardiology

## 2019-08-22 ENCOUNTER — Ambulatory Visit: Payer: HMO | Admitting: Cardiology

## 2019-08-22 VITALS — BP 128/72 | HR 91 | Temp 96.8°F | Ht 73.0 in | Wt 318.8 lb

## 2019-08-22 DIAGNOSIS — I480 Paroxysmal atrial fibrillation: Secondary | ICD-10-CM

## 2019-08-22 DIAGNOSIS — I10 Essential (primary) hypertension: Secondary | ICD-10-CM | POA: Diagnosis not present

## 2019-08-22 DIAGNOSIS — I5032 Chronic diastolic (congestive) heart failure: Secondary | ICD-10-CM

## 2019-08-22 MED ORDER — POTASSIUM CHLORIDE CRYS ER 20 MEQ PO TBCR: 20.0000 meq | EXTENDED_RELEASE_TABLET | Freq: Two times a day (BID) | ORAL | 3 refills | Status: DC

## 2019-08-22 MED ORDER — TORSEMIDE 20 MG PO TABS: 40.0000 mg | ORAL_TABLET | Freq: Two times a day (BID) | ORAL | 3 refills | Status: DC

## 2019-08-22 NOTE — Patient Instructions (Signed)
Medication Instructions:  STOP FUROSEMIDE  START DEMADEX 40 MG TWICE DAILY= 2 OF THE 20 MG  TABLETS TWICE DAILY  INCREASE POTASSIUM TO 1 20 MEQ TABLET TWICE DAILY  *If you need a refill on your cardiac medications before your next appointment, please call your pharmacy*  Lab Work: If you have labs (blood work) drawn today and your tests are completely normal, you will receive your results only by: Marland Kitchen MyChart Message (if you have MyChart) OR . A paper copy in the mail If you have any lab test that is abnormal or we need to change your treatment, we will call you to review the results.  Follow-Up: At [facility], you and your health needs are our priority.  As part of our continuing mission to provide you with exceptional heart care, we have created designated Provider Care Teams.  These Care Teams include your primary Cardiologist (physician) and Advanced Practice Providers (APPs -  Physician Assistants and Nurse Practitioners) who all work together to provide you with the care you need, when you need it.  Your physician recommends that you schedule a follow-up appointment Monday 08/28/2019 WITH APP  Your physician recommends that you schedule a follow-up appointment in: [place]

## 2019-08-22 NOTE — Addendum Note (Signed)
Addended by: Cristopher Estimable on: 08/22/2019 11:40 AM   Modules accepted: Orders

## 2019-08-23 DIAGNOSIS — I5042 Chronic combined systolic (congestive) and diastolic (congestive) heart failure: Secondary | ICD-10-CM | POA: Diagnosis not present

## 2019-08-23 DIAGNOSIS — R0902 Hypoxemia: Secondary | ICD-10-CM | POA: Diagnosis not present

## 2019-08-25 ENCOUNTER — Ambulatory Visit: Payer: HMO | Admitting: Family Medicine

## 2019-08-25 DIAGNOSIS — Z86711 Personal history of pulmonary embolism: Secondary | ICD-10-CM | POA: Diagnosis not present

## 2019-08-25 DIAGNOSIS — I83018 Varicose veins of right lower extremity with ulcer other part of lower leg: Secondary | ICD-10-CM | POA: Diagnosis not present

## 2019-08-25 DIAGNOSIS — J449 Chronic obstructive pulmonary disease, unspecified: Secondary | ICD-10-CM | POA: Diagnosis not present

## 2019-08-25 DIAGNOSIS — I480 Paroxysmal atrial fibrillation: Secondary | ICD-10-CM | POA: Diagnosis not present

## 2019-08-25 DIAGNOSIS — I110 Hypertensive heart disease with heart failure: Secondary | ICD-10-CM | POA: Diagnosis not present

## 2019-08-25 DIAGNOSIS — E1140 Type 2 diabetes mellitus with diabetic neuropathy, unspecified: Secondary | ICD-10-CM | POA: Diagnosis not present

## 2019-08-25 DIAGNOSIS — Z7901 Long term (current) use of anticoagulants: Secondary | ICD-10-CM | POA: Diagnosis not present

## 2019-08-25 DIAGNOSIS — K635 Polyp of colon: Secondary | ICD-10-CM | POA: Diagnosis not present

## 2019-08-25 DIAGNOSIS — I340 Nonrheumatic mitral (valve) insufficiency: Secondary | ICD-10-CM | POA: Diagnosis not present

## 2019-08-25 DIAGNOSIS — G4733 Obstructive sleep apnea (adult) (pediatric): Secondary | ICD-10-CM | POA: Diagnosis not present

## 2019-08-25 DIAGNOSIS — Z6841 Body Mass Index (BMI) 40.0 and over, adult: Secondary | ICD-10-CM | POA: Diagnosis not present

## 2019-08-25 DIAGNOSIS — G8929 Other chronic pain: Secondary | ICD-10-CM | POA: Diagnosis not present

## 2019-08-25 DIAGNOSIS — Z8701 Personal history of pneumonia (recurrent): Secondary | ICD-10-CM | POA: Diagnosis not present

## 2019-08-25 DIAGNOSIS — Z9181 History of falling: Secondary | ICD-10-CM | POA: Diagnosis not present

## 2019-08-25 DIAGNOSIS — Z87891 Personal history of nicotine dependence: Secondary | ICD-10-CM | POA: Diagnosis not present

## 2019-08-25 DIAGNOSIS — M5136 Other intervertebral disc degeneration, lumbar region: Secondary | ICD-10-CM | POA: Diagnosis not present

## 2019-08-25 DIAGNOSIS — L97821 Non-pressure chronic ulcer of other part of left lower leg limited to breakdown of skin: Secondary | ICD-10-CM | POA: Diagnosis not present

## 2019-08-25 DIAGNOSIS — Z9981 Dependence on supplemental oxygen: Secondary | ICD-10-CM | POA: Diagnosis not present

## 2019-08-25 DIAGNOSIS — L97811 Non-pressure chronic ulcer of other part of right lower leg limited to breakdown of skin: Secondary | ICD-10-CM | POA: Diagnosis not present

## 2019-08-25 DIAGNOSIS — I5042 Chronic combined systolic (congestive) and diastolic (congestive) heart failure: Secondary | ICD-10-CM | POA: Diagnosis not present

## 2019-08-25 DIAGNOSIS — Z7984 Long term (current) use of oral hypoglycemic drugs: Secondary | ICD-10-CM | POA: Diagnosis not present

## 2019-08-25 DIAGNOSIS — E1151 Type 2 diabetes mellitus with diabetic peripheral angiopathy without gangrene: Secondary | ICD-10-CM | POA: Diagnosis not present

## 2019-08-25 DIAGNOSIS — I83028 Varicose veins of left lower extremity with ulcer other part of lower leg: Secondary | ICD-10-CM | POA: Diagnosis not present

## 2019-08-28 ENCOUNTER — Telehealth: Payer: Self-pay

## 2019-08-28 ENCOUNTER — Other Ambulatory Visit: Payer: Self-pay

## 2019-08-28 ENCOUNTER — Emergency Department (HOSPITAL_COMMUNITY): Payer: HMO

## 2019-08-28 ENCOUNTER — Telehealth: Payer: Self-pay | Admitting: Cardiology

## 2019-08-28 ENCOUNTER — Inpatient Hospital Stay (HOSPITAL_COMMUNITY)
Admission: EM | Admit: 2019-08-28 | Discharge: 2019-09-01 | DRG: 308 | Disposition: A | Discharge status: Home Health | Payer: HMO | Attending: Internal Medicine | Admitting: Internal Medicine

## 2019-08-28 DIAGNOSIS — E1151 Type 2 diabetes mellitus with diabetic peripheral angiopathy without gangrene: Secondary | ICD-10-CM | POA: Diagnosis present

## 2019-08-28 DIAGNOSIS — M5136 Other intervertebral disc degeneration, lumbar region: Secondary | ICD-10-CM | POA: Diagnosis present

## 2019-08-28 DIAGNOSIS — Z87891 Personal history of nicotine dependence: Secondary | ICD-10-CM

## 2019-08-28 DIAGNOSIS — Z794 Long term (current) use of insulin: Secondary | ICD-10-CM

## 2019-08-28 DIAGNOSIS — Z8249 Family history of ischemic heart disease and other diseases of the circulatory system: Secondary | ICD-10-CM | POA: Diagnosis not present

## 2019-08-28 DIAGNOSIS — I472 Ventricular tachycardia, unspecified: Secondary | ICD-10-CM

## 2019-08-28 DIAGNOSIS — Z7901 Long term (current) use of anticoagulants: Secondary | ICD-10-CM | POA: Diagnosis not present

## 2019-08-28 DIAGNOSIS — I10 Essential (primary) hypertension: Secondary | ICD-10-CM | POA: Diagnosis present

## 2019-08-28 DIAGNOSIS — Z20828 Contact with and (suspected) exposure to other viral communicable diseases: Secondary | ICD-10-CM | POA: Diagnosis present

## 2019-08-28 DIAGNOSIS — I5031 Acute diastolic (congestive) heart failure: Secondary | ICD-10-CM | POA: Diagnosis not present

## 2019-08-28 DIAGNOSIS — R0600 Dyspnea, unspecified: Secondary | ICD-10-CM | POA: Diagnosis not present

## 2019-08-28 DIAGNOSIS — Z86711 Personal history of pulmonary embolism: Secondary | ICD-10-CM | POA: Diagnosis present

## 2019-08-28 DIAGNOSIS — I4819 Other persistent atrial fibrillation: Principal | ICD-10-CM | POA: Diagnosis present

## 2019-08-28 DIAGNOSIS — Z6841 Body Mass Index (BMI) 40.0 and over, adult: Secondary | ICD-10-CM | POA: Diagnosis not present

## 2019-08-28 DIAGNOSIS — G4733 Obstructive sleep apnea (adult) (pediatric): Secondary | ICD-10-CM | POA: Diagnosis present

## 2019-08-28 DIAGNOSIS — I5032 Chronic diastolic (congestive) heart failure: Secondary | ICD-10-CM

## 2019-08-28 DIAGNOSIS — I509 Heart failure, unspecified: Secondary | ICD-10-CM | POA: Diagnosis not present

## 2019-08-28 DIAGNOSIS — Z803 Family history of malignant neoplasm of breast: Secondary | ICD-10-CM

## 2019-08-28 DIAGNOSIS — R0602 Shortness of breath: Secondary | ICD-10-CM | POA: Diagnosis not present

## 2019-08-28 DIAGNOSIS — I110 Hypertensive heart disease with heart failure: Secondary | ICD-10-CM | POA: Diagnosis not present

## 2019-08-28 DIAGNOSIS — I340 Nonrheumatic mitral (valve) insufficiency: Secondary | ICD-10-CM | POA: Diagnosis not present

## 2019-08-28 DIAGNOSIS — I5033 Acute on chronic diastolic (congestive) heart failure: Secondary | ICD-10-CM | POA: Diagnosis not present

## 2019-08-28 DIAGNOSIS — Z801 Family history of malignant neoplasm of trachea, bronchus and lung: Secondary | ICD-10-CM

## 2019-08-28 DIAGNOSIS — E875 Hyperkalemia: Secondary | ICD-10-CM | POA: Diagnosis not present

## 2019-08-28 DIAGNOSIS — A419 Sepsis, unspecified organism: Secondary | ICD-10-CM | POA: Diagnosis not present

## 2019-08-28 DIAGNOSIS — I480 Paroxysmal atrial fibrillation: Secondary | ICD-10-CM | POA: Diagnosis not present

## 2019-08-28 LAB — BASIC METABOLIC PANEL
Anion gap: 9 (ref 5–15)
BUN: 26 mg/dL — ABNORMAL HIGH (ref 8–23)
CO2: 34 mmol/L — ABNORMAL HIGH (ref 22–32)
Calcium: 8.7 mg/dL — ABNORMAL LOW (ref 8.9–10.3)
Chloride: 92 mmol/L — ABNORMAL LOW (ref 98–111)
Creatinine, Ser: 1.56 mg/dL — ABNORMAL HIGH (ref 0.61–1.24)
GFR calc Af Amer: 53 mL/min — ABNORMAL LOW (ref >60)
GFR calc non Af Amer: 46 mL/min — ABNORMAL LOW (ref >60)
Glucose, Bld: 132 mg/dL — ABNORMAL HIGH (ref 70–99)
Potassium: 4.5 mmol/L (ref 3.5–5.1)
Sodium: 135 mmol/L (ref 135–145)

## 2019-08-28 LAB — HEPATIC FUNCTION PANEL
ALT: 11 U/L (ref 0–44)
AST: 16 U/L (ref 15–41)
Albumin: 3.1 g/dL — ABNORMAL LOW (ref 3.5–5.0)
Alkaline Phosphatase: 77 U/L (ref 38–126)
Bilirubin, Direct: 0.3 mg/dL — ABNORMAL HIGH (ref 0.0–0.2)
Indirect Bilirubin: 0.8 mg/dL (ref 0.3–0.9)
Total Bilirubin: 1.1 mg/dL (ref 0.3–1.2)
Total Protein: 7.7 g/dL (ref 6.5–8.1)

## 2019-08-28 LAB — CBC
HCT: 42.9 % (ref 39.0–52.0)
Hemoglobin: 12.5 g/dL — ABNORMAL LOW (ref 13.0–17.0)
MCH: 25.6 pg — ABNORMAL LOW (ref 26.0–34.0)
MCHC: 29.1 g/dL — ABNORMAL LOW (ref 30.0–36.0)
MCV: 87.9 fL (ref 80.0–100.0)
Platelets: 333 10*3/uL (ref 150–400)
RBC: 4.88 MIL/uL (ref 4.22–5.81)
RDW: 19.8 % — ABNORMAL HIGH (ref 11.5–15.5)
WBC: 9.6 10*3/uL (ref 4.0–10.5)
nRBC: 0 % (ref 0.0–0.2)

## 2019-08-28 LAB — BRAIN NATRIURETIC PEPTIDE: B Natriuretic Peptide: 413.9 pg/mL — ABNORMAL HIGH (ref 0.0–100.0)

## 2019-08-28 LAB — HIV ANTIBODY (ROUTINE TESTING W REFLEX): HIV Screen 4th Generation wRfx: NONREACTIVE

## 2019-08-28 LAB — MAGNESIUM: Magnesium: 2.3 mg/dL (ref 1.7–2.4)

## 2019-08-28 LAB — TSH: TSH: 2.805 u[IU]/mL (ref 0.350–4.500)

## 2019-08-28 MED ORDER — METOPROLOL TARTRATE 50 MG PO TABS
50.0000 mg | ORAL_TABLET | Freq: Two times a day (BID) | ORAL | Status: DC
  Administered 2019-08-29 – 2019-09-01 (×8): 50 mg via ORAL
  Filled 2019-08-28 (×4): qty 1
  Filled 2019-08-28 (×2): qty 2
  Filled 2019-08-28 (×2): qty 1

## 2019-08-28 MED ORDER — INSULIN ASPART 100 UNIT/ML ~~LOC~~ SOLN
0.0000 [IU] | Freq: Three times a day (TID) | SUBCUTANEOUS | Status: DC
  Administered 2019-08-29 – 2019-08-31 (×8): 3 [IU] via SUBCUTANEOUS

## 2019-08-28 MED ORDER — ZOLPIDEM TARTRATE 5 MG PO TABS: 5.0000 mg | ORAL_TABLET | Freq: Every evening | ORAL | Status: DC | PRN

## 2019-08-28 MED ORDER — ALPRAZOLAM 0.25 MG PO TABS: 0.2500 mg | ORAL_TABLET | Freq: Two times a day (BID) | ORAL | Status: DC | PRN

## 2019-08-28 MED ORDER — SODIUM CHLORIDE 0.9% FLUSH
3.0000 mL | Freq: Once | INTRAVENOUS | Status: AC
  Administered 2019-08-28: 3 mL via INTRAVENOUS

## 2019-08-28 MED ORDER — ACETAMINOPHEN 325 MG PO TABS: 650.0000 mg | ORAL_TABLET | ORAL | Status: DC | PRN

## 2019-08-28 MED ORDER — SODIUM CHLORIDE 0.9% FLUSH
3.0000 mL | Freq: Two times a day (BID) | INTRAVENOUS | Status: DC
  Administered 2019-08-28 – 2019-09-01 (×7): 3 mL via INTRAVENOUS

## 2019-08-28 MED ORDER — SODIUM CHLORIDE 0.9 % IV SOLN
250.0000 mL | INTRAVENOUS | Status: DC | PRN
  Administered 2019-09-01: 10:00:00 via INTRAVENOUS

## 2019-08-28 MED ORDER — RIVAROXABAN 20 MG PO TABS
20.0000 mg | ORAL_TABLET | Freq: Every day | ORAL | Status: DC
  Administered 2019-08-29: 20 mg via ORAL
  Filled 2019-08-28 (×2): qty 1

## 2019-08-28 MED ORDER — PRAVASTATIN SODIUM 10 MG PO TABS
20.0000 mg | ORAL_TABLET | Freq: Every day | ORAL | Status: DC
  Administered 2019-08-29 – 2019-09-01 (×4): 20 mg via ORAL
  Filled 2019-08-28 (×5): qty 2

## 2019-08-28 MED ORDER — FUROSEMIDE 10 MG/ML IJ SOLN
80.0000 mg | Freq: Once | INTRAMUSCULAR | Status: AC
  Administered 2019-08-28: 80 mg via INTRAVENOUS
  Filled 2019-08-28: qty 8

## 2019-08-28 MED ORDER — SODIUM CHLORIDE 0.9% FLUSH
3.0000 mL | INTRAVENOUS | Status: DC | PRN
  Administered 2019-09-01: 3 mL via INTRAVENOUS

## 2019-08-28 MED ORDER — POTASSIUM CHLORIDE CRYS ER 20 MEQ PO TBCR
20.0000 meq | EXTENDED_RELEASE_TABLET | Freq: Two times a day (BID) | ORAL | Status: DC
  Administered 2019-08-29 – 2019-08-31 (×6): 20 meq via ORAL
  Filled 2019-08-28 (×4): qty 1
  Filled 2019-08-28: qty 2
  Filled 2019-08-28: qty 1

## 2019-08-28 MED ORDER — ONDANSETRON HCL 4 MG/2ML IJ SOLN: 4.0000 mg | Freq: Four times a day (QID) | INTRAMUSCULAR | Status: DC | PRN

## 2019-08-28 MED ORDER — FUROSEMIDE 10 MG/ML IJ SOLN
40.0000 mg | Freq: Two times a day (BID) | INTRAMUSCULAR | Status: DC
  Administered 2019-08-28 – 2019-09-01 (×8): 40 mg via INTRAVENOUS
  Filled 2019-08-28 (×9): qty 4

## 2019-08-28 NOTE — ED Triage Notes (Signed)
Pt recently switched diuretics per Dr. Stanford Breed and this new pill (pt doesn't know the name) is not working-pt having increased swelling in abdomen and legs with exertional shob.

## 2019-08-28 NOTE — Telephone Encounter (Signed)
Spoke with pt wife, she is concerned because he does not have the urge to go to the bathroom. They will continue to wait to be seen.

## 2019-08-28 NOTE — Telephone Encounter (Signed)
Pt c/o swelling: STAT is pt has developed SOB within 24 hours  1) How much weight have you gained and in what time span? 5 pounds in less than a week  2) If swelling, where is the swelling located? Legs and stomach area  3) Are you currently taking a fluid pill? yes  4) Are you currently SOB? No not yet. Wife wants to avoid getting to that point  5) Do you have a log of your daily weights (if so, list)?   Thurs 319, Fri 323, Sun 323, Mon 324  6) Have you gained 3 pounds in a day or 5 pounds in a week? 5 pounds since Thurs  7) Have you traveled recently? no  Wife says the patient is having a hard time adjusting to his new fluid pill. At his recent visit with Dr. Stanford Breed, the fluid pill changed to a new medicine, and the wife wants to know if the patient can go back on the old medication.

## 2019-08-28 NOTE — ED Provider Notes (Signed)
[facility] EMERGENCY DEPARTMENT Provider Note   CSN: [id number] Arrival date & time: 08/28/19  1204     History   Chief Complaint No chief complaint on file.   HPI William Alvarado is a 68 y.o. male.     The history is provided by the patient and medical records. No language interpreter was used.   William Alvarado is a 68 y.o. male who presents to the Emergency Department complaining of fluid overload. He presents to the emergency department complaining of one week of persistent volume overload. He saw cardiology about one week ago and his Lasix was changed to torsemide. He has been compliant with medications but complains of progressive bloating, fatigue, dyspnea on exertion and decreased urinary output. He denies any fevers, chest pain, nausea, vomiting. No known COVID 19 exposures. He lives at home with his wife. Symptoms are moderate to severe, constant, worsening. He is on baseline 2 L nasal cannula oxygen at home. He weighed himself today and was 322 pounds and he states it's about the same as it was one week ago.  Past Medical History:  Diagnosis Date  . Acute respiratory failure with hypoxia (HCC)    Hospitalized with acute respiratory failure secondary to CAP, diastolic CHF, and pulmonary embolism  . AKI (acute kidney injury) (HCC)   . Allergy    allergic rhinitis  . Altered mental status   . Atrial fibrillation (HCC)   . Chronic anticoagulation 03/26/2017   Failed Coumadin (PE with therapeutic INR). Now on Xarelto  . Colon polyps   . Community acquired pneumonia of right lower lobe of lung   . COPD (chronic obstructive pulmonary disease) (HCC)   . Degenerative disc disease, lumbar   . Diabetes mellitus without complication (HCC)   . History of pulmonary embolus (PE) 03/26/2017  . Hypertension   . Mitral regurgitation 12/26/2012  . Obesity (BMI 30-39.9) 12/09/2012  . PAF (paroxysmal atrial fibrillation) (HCC)    DCCV in 2014 with recurrent PAF in  setting of respiratory failure June 2018. S/P DCCV 04/13/17 to NSR  . Persistent atrial fibrillation (HCC)   . Pneumonia ~ 2016  . Sepsis (HCC) 03/10/2017  . Sleep apnea   . Systolic murmur   . Type II diabetes mellitus [facility])     Patient Active Problem List   Diagnosis Date Noted  . Acute on chronic heart failure (HCC) 08/28/2019  . Acute on chronic diastolic (congestive) heart failure (HCC)   . Chronic venous insufficiency 06/20/2019  . PVD (peripheral vascular disease) (HCC) 03/21/2019  . Venous stasis ulcer (HCC) 03/21/2019  . Hypoxia 03/21/2019  . OSA (obstructive sleep apnea) 03/21/2019  . Gait instability 03/21/2019  . CHF (congestive heart failure) (HCC) 03/21/2019  . Shock (HCC)   . Hypovolemia   . Dehydration   . Hypotension 12/09/2017  . Persistent atrial fibrillation (HCC)   . Chronic anticoagulation 03/26/2017  . History of pulmonary embolus (PE) 03/26/2017  . Sepsis (HCC) 03/10/2017  . Altered mental status   . PAF (paroxysmal atrial fibrillation) (HCC)   . Mitral regurgitation 12/26/2012  . Morbid obesity (HCC) 12/09/2012  . Hypertension   . Allergy   . Systolic murmur   . Colon polyps   . Diabetes mellitus without complication (HCC)   . Degenerative disc disease, lumbar     Past Surgical History:  Procedure Laterality Date  . CARDIOVERSION N/A 01/09/2013   Procedure: CARDIOVERSION;  Surgeon: Pricilla Riffle, MD;  Location: [facility] ENDOSCOPY;  Service:  Cardiovascular;  Laterality: N/A;  . CARDIOVERSION N/A 04/13/2017   Procedure: CARDIOVERSION;  Surgeon: Sueanne Margarita, MD;  Location: [facility] ENDOSCOPY;  Service: Cardiovascular;  Laterality: N/A;  . TEE WITHOUT CARDIOVERSION N/A 01/09/2013   Procedure: TRANSESOPHAGEAL ECHOCARDIOGRAM (TEE);  Surgeon: Fay Records, MD;  Location: [facility] ENDOSCOPY;  Service: Cardiovascular;  Laterality: N/A;        Home Medications    Prior to Admission medications   Medication Sig Start Date End Date Taking? Authorizing Provider   acetaminophen (TYLENOL) 500 MG tablet Take 1,000 mg by mouth 2 (two) times daily as needed (pain).   Yes [provider]  Dulaglutide (TRULICITY) 1.5 GX/2.1JH SOPN INJECT 1.5 MG INTO THE SKIN ONCE A WEEK Patient taking differently: Inject 1.5 mg into the skin every Friday.  12/05/18  Yes [name], MD  metoprolol tartrate (LOPRESSOR) 50 MG tablet Take 1 tablet by mouth twice daily Patient taking differently: Take 50 mg by mouth 2 (two) times daily.  08/14/19  Yes [name], MD  OXYGEN Inhale 2 L into the lungs continuous.   Yes [provider]  potassium chloride SA (KLOR-CON) 20 MEQ tablet Take 1 tablet (20 mEq total) by mouth 2 (two) times daily. Patient taking differently: Take 40 mEq by mouth 2 (two) times daily.  08/22/19  Yes Lelon Perla, MD  pravastatin (PRAVACHOL) 20 MG tablet Take 1 tablet (20 mg total) by mouth daily. 07/03/19  Yes [name], MD  rivaroxaban (XARELTO) 20 MG TABS tablet TAKE 1 TABLET BY MOUTH ONCE DAILY WITH SUPPER Patient taking differently: Take 20 mg by mouth daily with supper.  11/16/18  Yes [name], MD  sitaGLIPtin (JANUVIA) 100 MG tablet Take 1 tablet (100 mg total) by mouth daily. Patient taking differently: Take 100 mg by mouth daily with supper.  11/23/18  Yes [name], MD  torsemide (DEMADEX) 20 MG tablet Take 2 tablets (40 mg total) by mouth 2 (two) times daily. 08/22/19 11/20/19 Yes Lelon Perla, MD  cephALEXin (KEFLEX) 500 MG capsule Take 1 capsule (500 mg total) by mouth 2 (two) times daily. 08/21/19   Alycia Rossetti, MD    Family History Family History  Problem Relation Age of Onset  . Cancer Mother 77       breast  . Heart disease Father 28       MI  . Cancer Brother        lung  . Stroke Brother 42    Social History Social History   Tobacco Use  . Smoking status: Former Smoker    Packs/day: 1.00    Years: 43.00    Pack years: 43.00    Types: Cigarettes    Quit date:  03/08/2017    Years since quitting: 2.4  . Smokeless tobacco: Never Used  Substance Use Topics  . Alcohol use: No    Comment: Rare  . Drug use: No     Allergies   Patient has no known allergies.   Review of Systems Review of Systems  All other systems reviewed and are negative.    Physical Exam Updated Vital Signs BP 125/77   Pulse 80   Temp 97.8 F (36.6 C) (Oral)   Resp (!) 24   Ht 6\' 1"  (1.854 m)   Wt (!) 146.1 kg   SpO2 98%   BMI 42.48 kg/m   Physical Exam Vitals signs and nursing note reviewed.  Constitutional:  Appearance: He is well-developed.  HENT:     Head: Normocephalic and atraumatic.  Cardiovascular:     Rate and Rhythm: Normal rate and regular rhythm.     Heart sounds: No murmur.  Pulmonary:     Effort: Pulmonary effort is normal. No respiratory distress.     Breath sounds: Normal breath sounds.  Abdominal:     Palpations: Abdomen is soft.     Tenderness: There is no abdominal tenderness. There is no guarding or rebound.  Musculoskeletal:        General: Swelling present. No tenderness.     Comments: 2+ pitting edema to bilateral lower extremities, right greater than left. There is pitting edema to the abdominal wall.  Skin:    General: Skin is warm and dry.  Neurological:     Mental Status: He is alert and oriented to person, place, and time.  Psychiatric:        Behavior: Behavior normal.      ED Treatments / Results  Labs (all labs ordered are listed, but only abnormal results are displayed) Labs Reviewed  BASIC METABOLIC PANEL - Abnormal; Notable for the following components:      Result Value   Chloride 92 (*)    CO2 34 (*)    Glucose, Bld 132 (*)    BUN 26 (*)    Creatinine, Ser 1.56 (*)    Calcium 8.7 (*)    GFR calc non Af Amer 46 (*)    GFR calc Af Amer 53 (*)    All other components within normal limits  CBC - Abnormal; Notable for the following components:   Hemoglobin 12.5 (*)    MCH 25.6 (*)    MCHC 29.1 (*)     RDW 19.8 (*)    All other components within normal limits  BRAIN NATRIURETIC PEPTIDE - Abnormal; Notable for the following components:   B Natriuretic Peptide 413.9 (*)    All other components within normal limits  HEPATIC FUNCTION PANEL - Abnormal; Notable for the following components:   Albumin 3.1 (*)    Bilirubin, Direct 0.3 (*)    All other components within normal limits  SARS CORONAVIRUS 2 (TAT 6-24 HRS)  MAGNESIUM  HIV ANTIBODY (ROUTINE TESTING W REFLEX)  TSH  CBC    EKG None  Radiology Dg Chest 2 View  Result Date: 08/28/2019 CLINICAL DATA:  Shortness of breath, recent change in diuretic EXAM: CHEST - 2 VIEW COMPARISON:  05/07/2019 FINDINGS: Cardiomegaly. Unchanged elevation of the left hemidiaphragm. Mild diffuse interstitial pulmonary opacity. The visualized skeletal structures are unremarkable. IMPRESSION: Cardiomegaly with mild, diffuse interstitial pulmonary opacity, generally in keeping with edema. No focal airspace opacity. Electronically Signed   By: Lauralyn PrimesAlex  Bibbey M.D.   On: 08/28/2019 13:13    Procedures Procedures (including critical care time)  Medications Ordered in ED Medications  metoprolol tartrate (LOPRESSOR) tablet 50 mg (has no administration in time range)  pravastatin (PRAVACHOL) tablet 20 mg (20 mg Oral Refused 08/28/19 2202)  rivaroxaban (XARELTO) tablet 20 mg (20 mg Oral Not Given 08/28/19 2251)  potassium chloride SA (KLOR-CON) CR tablet 20 mEq (has no administration in time range)  sodium chloride flush (NS) 0.9 % injection 3 mL (3 mLs Intravenous Given 08/28/19 2251)  sodium chloride flush (NS) 0.9 % injection 3 mL (has no administration in time range)  0.9 %  sodium chloride infusion (has no administration in time range)  acetaminophen (TYLENOL) tablet 650 mg (has no administration in time range)  ondansetron (ZOFRAN) injection 4 mg (has no administration in time range)  furosemide (LASIX) injection 40 mg (40 mg Intravenous Given 08/28/19 0715)   zolpidem (AMBIEN) tablet 5 mg (has no administration in time range)  ALPRAZolam (XANAX) tablet 0.25 mg (has no administration in time range)  insulin aspart (novoLOG) injection 0-20 Units (has no administration in time range)  sodium chloride flush (NS) 0.9 % injection 3 mL (3 mLs Intravenous Given 08/28/19 1925)  furosemide (LASIX) injection 80 mg (80 mg Intravenous Given 08/28/19 2251)     Initial Impression / Assessment and Plan / ED Course  I have reviewed the triage vital signs and the nursing notes.  Pertinent labs & imaging results that were available during my care of the patient were reviewed by me and considered in my medical decision making (see chart for details).        Patient here for evaluation of progressive edema, shortness of breath. He is markedly edematous on examination, chest x-ray with pulmonary vascular congestion. He was treated with Lasix for volume overload. Cardiology consulted for admission. Patient updated findings of studies recommendation for admission and he is in agreement treatment plan.  Final Clinical Impressions(s) / ED Diagnoses   Final diagnoses:  Acute congestive heart failure, unspecified heart failure type [facility])    ED Discharge Orders    None       Tilden Fossa, MD 08/29/19 [phone]

## 2019-08-28 NOTE — Telephone Encounter (Signed)
Follow Up  Patient's wife is calling In stating that her and the patient are at the hospital as instructed and had been there since 11:30 am. Patient's wife states that they have been there all this time and do not have a exam room yet and the entire time that they have been in the ER the patient has not been able to use the bathroom. Patient's wife would like to know what she needs to do or if there is anything she can do to help.

## 2019-08-28 NOTE — H&P (Signed)
Cardiology Admission History and Physical:   Patient ID: ARMONDO CECH MRN: [id number]; DOB: 24-Feb-1951   Admission date: 08/28/2019  Primary Care Provider: Donita Brooks, MD Primary Cardiologist: Olga Millers, MD  Primary Electrophysiologist:  None   Chief Complaint:  Increased swelling  Patient Profile:   William Alvarado is a 68 y.o. male with pmh of paroxysmal afib, PE on anticoagulation, HTN, mild aortic and mitral stenosis, chronic diastolic HF, and morbid obesity.   History of Present Illness:   Mr. Feutz follows with Dr. Jens Som for the above cardiac problems. He was seen last week for increased bilateral lower edema and his diuretic was changed from Lasix to Torsemide 40 mg BID. Since then the patient has felt his edema has worsened.   His last echo was 04/2018 showing EF 55-60%, normal wall motion, left atrium mildly dilated, RA moderately dilated, aortic valve sclerosis, mild MR,   He presented to the ED 08/28/19 for fluid overload. He reported progressive abdominal distention, fatigue and dyspnea. He is on 2L O2 at baseline. He has compression wraps on that were changed Friday by a home health nurse. Reported 5lb weight gain since last week. Denies chest pain, fever, chills. In the ED BP 112/94, pulse 82, afebrile, RR 17. Labs showed creatinine 1.56, BUN 26, glucose 132. Hgb 12.5. BNP 413. CXR showed cardiomegaly with mild diffuse interstitial pulmonary opacity, likely edema. Cardiology was consulted.  Heart Pathway Score:     Past Medical History:  Diagnosis Date  . Acute respiratory failure with hypoxia (HCC)    Hospitalized with acute respiratory failure secondary to CAP, diastolic CHF, and pulmonary embolism  . AKI (acute kidney injury) (HCC)   . Allergy    allergic rhinitis  . Altered mental status   . Atrial fibrillation (HCC)   . Chronic anticoagulation 03/26/2017   Failed Coumadin (PE with therapeutic INR). Now on Xarelto  . Colon polyps   .  Community acquired pneumonia of right lower lobe of lung   . COPD (chronic obstructive pulmonary disease) (HCC)   . Degenerative disc disease, lumbar   . Diabetes mellitus without complication (HCC)   . History of pulmonary embolus (PE) 03/26/2017  . Hypertension   . Mitral regurgitation 12/26/2012  . Obesity (BMI 30-39.9) 12/09/2012  . PAF (paroxysmal atrial fibrillation) (HCC)    DCCV in 2014 with recurrent PAF in setting of respiratory failure June 2018. S/P DCCV 04/13/17 to NSR  . Persistent atrial fibrillation (HCC)   . Pneumonia ~ 2016  . Sepsis (HCC) 03/10/2017  . Sleep apnea   . Systolic murmur   . Type II diabetes mellitus (HCC)     Past Surgical History:  Procedure Laterality Date  . CARDIOVERSION N/A 01/09/2013   Procedure: CARDIOVERSION;  Surgeon: Pricilla Riffle, MD;  Location: [facility] ENDOSCOPY;  Service: Cardiovascular;  Laterality: N/A;  . CARDIOVERSION N/A 04/13/2017   Procedure: CARDIOVERSION;  Surgeon: Quintella Reichert, MD;  Location: [facility] ENDOSCOPY;  Service: Cardiovascular;  Laterality: N/A;  . TEE WITHOUT CARDIOVERSION N/A 01/09/2013   Procedure: TRANSESOPHAGEAL ECHOCARDIOGRAM (TEE);  Surgeon: Pricilla Riffle, MD;  Location: [facility] ENDOSCOPY;  Service: Cardiovascular;  Laterality: N/A;     Medications Prior to Admission: Prior to Admission medications   Medication Sig Start Date End Date Taking? Authorizing Provider  torsemide (DEMADEX) 20 MG tablet Take 2 tablets (40 mg total) by mouth 2 (two) times daily. 08/22/19 11/20/19 Yes Lewayne Bunting, MD  cephALEXin (KEFLEX) 500 MG capsule Take 1 capsule (500  mg total) by mouth 2 (two) times daily. 08/21/19   Salley Scarleturham, Kawanta F, MD  Dulaglutide (TRULICITY) 1.5 MG/0.5ML SOPN INJECT 1.5 MG INTO THE SKIN ONCE A WEEK 12/05/18   Donita BrooksPickard, Warren T, MD  metoprolol tartrate (LOPRESSOR) 50 MG tablet Take 1 tablet by mouth twice daily 08/14/19   Donita BrooksPickard, Warren T, MD  potassium chloride SA (KLOR-CON) 20 MEQ tablet Take 1 tablet (20 mEq total) by mouth 2  (two) times daily. 08/22/19   Lewayne Buntingrenshaw, Brian S, MD  pravastatin (PRAVACHOL) 20 MG tablet Take 1 tablet (20 mg total) by mouth daily. 07/03/19   Donita BrooksPickard, Warren T, MD  rivaroxaban (XARELTO) 20 MG TABS tablet TAKE 1 TABLET BY MOUTH ONCE DAILY WITH SUPPER 11/16/18   Donita BrooksPickard, Warren T, MD  sitaGLIPtin (JANUVIA) 100 MG tablet Take 1 tablet (100 mg total) by mouth daily. 11/23/18   Donita BrooksPickard, Warren T, MD     Allergies:   No Known Allergies  Social History:   Social History   Socioeconomic History  . Marital status: Married    Spouse name: Not on file  . Number of children: 2  . Years of education: Not on file  . Highest education level: Not on file  Occupational History    Comment: Machinist  Social Needs  . Financial resource strain: Not on file  . Food insecurity    Worry: Never true    Inability: Never true  . Transportation needs    Medical: No    Non-medical: No  Tobacco Use  . Smoking status: Former Smoker    Packs/day: 1.00    Years: 43.00    Pack years: 43.00    Types: Cigarettes    Quit date: 03/08/2017    Years since quitting: 2.4  . Smokeless tobacco: Never Used  Substance and Sexual Activity  . Alcohol use: No    Comment: Rare  . Drug use: No  . Sexual activity: Not Currently  Lifestyle  . Physical activity    Days per week: Not on file    Minutes per session: Not on file  . Stress: Not on file  Relationships  . Social Musicianconnections    Talks on phone: Not on file    Gets together: Not on file    Attends religious service: Not on file    Active member of club or organization: Not on file    Attends meetings of clubs or organizations: Not on file    Relationship status: Not on file  . Intimate partner violence    Fear of current or ex partner: Not on file    Emotionally abused: Not on file    Physically abused: Not on file    Forced sexual activity: Not on file  Other Topics Concern  . Not on file  Social History Narrative  . Not on file    Family History:    The patient's family history includes Cancer in his brother; Cancer (age of onset: 6650) in his mother; Heart disease (age of onset: 7150) in his father; Stroke (age of onset: 8245) in his brother.    ROS:  Please see the history of present illness.  All other ROS reviewed and negative.     Physical Exam/Data:   Vitals:   08/28/19 1210 08/28/19 1544 08/28/19 1706  BP: 90/80 119/85 (!) 112/94  Pulse: 90 82 82  Resp: 17 18 17   Temp: 97.8 F (36.6 C)    TempSrc: Oral    SpO2: 100% 100% 100%  Weight: (!) 146.1 kg    Height: 6\' 1"  (1.854 m)      Intake/Output Summary (Last 24 hours) at 08/28/2019 1812 Last data filed at 08/28/2019 1706 Gross per 24 hour  Intake -  Output 325 ml  Net -325 ml   Last 3 Weights 08/28/2019 08/22/2019 08/21/2019  Weight (lbs) 322 lb 318 lb 12.8 oz 317 lb  Weight (kg) 146.058 kg 144.607 kg 143.79 kg     Body mass index is 42.48 kg/m.  General:  Well nourished, well developed, in no acute distress HEENT: normal Lymph: no adenopathy Neck: difficult to assess JVD Endocrine:  No thryomegaly Vascular: No carotid bruits; FA pulses 2+ bilaterally without bruits  Cardiac:  Distant heart sounds; normal S1, S2; Irreg Irreg; no murmur  Lungs:  clear to auscultation bilaterally, no wheezing, rhonchi or rales  Abd: soft, nontender, no hepatomegaly  Ext: 1-2+ lower extremity edema Musculoskeletal:  No deformities, BUE and BLE strength normal and equal Skin: warm and dry  Neuro:  CNs 2-12 intact, no focal abnormalities noted Psych:  Normal affect    EKG:  The ECG that was done 08/28/19 was personally reviewed and demonstrates Afib, 90 bpm.  Relevant CV Studies:  Echo 04/2018 Study Conclusions  - Left ventricle: The cavity size was normal. Wall thickness was   increased in a pattern of mild LVH. Systolic function was normal.   The estimated ejection fraction was in the range of 55% to 60%.   Wall motion was normal; there were no regional wall motion    abnormalities. The study was not technically sufficient to allow   evaluation of LV diastolic dysfunction due to atrial   fibrillation. - Ventricular septum: D-shaped interventricular septum suggestive   of RV pressure/volume overload. - Aortic valve: Trileaflet; severely calcified leaflets. Sclerosis   without stenosis. Valve area (VTI): 2.61 cm^2. - Mitral valve: Moderately calcified annulus. Elevated mean   gradient across mitral valve but short pressure half-time. I do   not think that there is significant mitral stenosis. There was   mild regurgitation. Pressure half-time: 77 ms. Mean gradient (D):   9 mm Hg. Valve area by pressure half-time: 2.86 cm^2. - Left atrium: The atrium was moderately dilated. - Right ventricle: The cavity size was mildly dilated. Systolic   function was normal. - Right atrium: The atrium was moderately dilated. - Tricuspid valve: Peak RV-RA gradient (S): 32 mm Hg. - Pulmonary arteries: PA peak pressure: 35 mm Hg (S). - Inferior vena cava: The vessel was normal in size. The   respirophasic diameter changes were in the normal range (= 50%),   consistent with normal central venous pressure.  Laboratory Data:  High Sensitivity Troponin:  No results for input(s): TROPONINIHS in the last 720 hours.    Chemistry Recent Labs  Lab 08/28/19 1257  NA 135  K 4.5  CL 92*  CO2 34*  GLUCOSE 132*  BUN 26*  CREATININE 1.56*  CALCIUM 8.7*  GFRNONAA 46*  GFRAA 53*  ANIONGAP 9    No results for input(s): PROT, ALBUMIN, AST, ALT, ALKPHOS, BILITOT in the last 168 hours. Hematology Recent Labs  Lab 08/28/19 1257  WBC 9.6  RBC 4.88  HGB 12.5*  HCT 42.9  MCV 87.9  MCH 25.6*  MCHC 29.1*  RDW 19.8*  PLT 333   BNP Recent Labs  Lab 08/28/19 1257  BNP 413.9*    DDimer No results for input(s): DDIMER in the last 168 hours.   Radiology/Studies:  Dg  Chest 2 View  Result Date: 08/28/2019 CLINICAL DATA:  Shortness of breath, recent change in diuretic  EXAM: CHEST - 2 VIEW COMPARISON:  05/07/2019 FINDINGS: Cardiomegaly. Unchanged elevation of the left hemidiaphragm. Mild diffuse interstitial pulmonary opacity. The visualized skeletal structures are unremarkable. IMPRESSION: Cardiomegaly with mild, diffuse interstitial pulmonary opacity, generally in keeping with edema. No focal airspace opacity. Electronically Signed   By: Lauralyn Primes M.D.   On: 08/28/2019 13:13    Assessment and Plan:   Acute on Chronic Diastolic Heart Failure Patient presented with progressive sob, fatigue, and abdominal distention. He was seen by Dr. Jens Som last week and lasix was changed to Torsemide. He feels edema has worsened. BNP 413, CXR significant for edema - Will admit for IV diuresis, Lasix 40 mg BID - Repeat echo - monitor daily weights - I/Os - follow creatinine and electrolytes - Start home Lopressor  Paroxysmal afib - Patient in rate controlled afib - will plan for possible DCCV after diuresis - a/c with Xarelto  PE - continue Xarelto  HTN  - Lopressor 50 mg BID   Severity of Illness: The appropriate patient status for this patient is INPATIENT. Inpatient status is judged to be reasonable and necessary in order to provide the required intensity of service to ensure the patient's safety. The patient's presenting symptoms, physical exam findings, and initial radiographic and laboratory data in the context of their chronic comorbidities is felt to place them at high risk for further clinical deterioration. Furthermore, it is not anticipated that the patient will be medically stable for discharge from the hospital within 2 midnights of admission. The following factors support the patient status of inpatient.   " The patient's presenting symptoms include edema. " The worrisome physical exam findings include lower extremity edema. " The initial radiographic and laboratory data are worrisome because of elevated BNP. " The chronic co-morbidities include  HTN and PAF.   * I certify that at the point of admission it is my clinical judgment that the patient will require inpatient hospital care spanning beyond 2 midnights from the point of admission due to high intensity of service, high risk for further deterioration and high frequency of surveillance required.*    For questions or updates, please contact [facility] Please consult [email or web address] for contact info under        Signed, Latosha Gaylord David Stall, PA-C  08/28/2019 6:12 PM

## 2019-08-28 NOTE — Telephone Encounter (Signed)
Would arrange visit with APP.  May need admission for IV diuresis. Kirk Ruths

## 2019-08-28 NOTE — Telephone Encounter (Signed)
Spoke with pt wife, his weight is up to 324 lbs and his edema has not changed since he was seen last week. Advised wife to take the patient to [place] er for evaluation and treatment.

## 2019-08-28 NOTE — Telephone Encounter (Signed)
Pt wife called and stated pt has gained 5 lbs in less than a week. Pt wife states that he is not SOB but is barely urinating and states pt says stomach "feels tight". Read through last office note per Dr Stanford Breed, furosemide was switched to turosemide. Pt wife states pt is not handling that well. Office note also said that pt could potentially need IV diuresis to get fluid off. Wife also stated that last time he doubled his fluid pill that he went into kidney failure. Did not tell pt to take an extra dose of fluid pill d/t that reason. Advised that this message would be sent to Dr Stanford Breed for advice.

## 2019-08-29 ENCOUNTER — Inpatient Hospital Stay (HOSPITAL_COMMUNITY): Payer: HMO

## 2019-08-29 ENCOUNTER — Other Ambulatory Visit: Payer: Self-pay

## 2019-08-29 DIAGNOSIS — I5031 Acute diastolic (congestive) heart failure: Secondary | ICD-10-CM | POA: Diagnosis not present

## 2019-08-29 DIAGNOSIS — I509 Heart failure, unspecified: Secondary | ICD-10-CM

## 2019-08-29 LAB — GLUCOSE, CAPILLARY
Glucose-Capillary: 174 mg/dL — ABNORMAL HIGH (ref 70–99)
Glucose-Capillary: 146 mg/dL — ABNORMAL HIGH (ref 70–99)
Glucose-Capillary: 130 mg/dL — ABNORMAL HIGH (ref 70–99)

## 2019-08-29 LAB — CBG MONITORING, ED
Glucose-Capillary: 158 mg/dL — ABNORMAL HIGH (ref 70–99)
Glucose-Capillary: 122 mg/dL — ABNORMAL HIGH (ref 70–99)
Glucose-Capillary: 136 mg/dL — ABNORMAL HIGH (ref 70–99)

## 2019-08-29 LAB — CBC
HCT: 42.6 % (ref 39.0–52.0)
Hemoglobin: 12.6 g/dL — ABNORMAL LOW (ref 13.0–17.0)
MCH: 25.8 pg — ABNORMAL LOW (ref 26.0–34.0)
MCHC: 29.6 g/dL — ABNORMAL LOW (ref 30.0–36.0)
MCV: 87.1 fL (ref 80.0–100.0)
Platelets: 324 10*3/uL (ref 150–400)
RBC: 4.89 MIL/uL (ref 4.22–5.81)
RDW: 19.6 % — ABNORMAL HIGH (ref 11.5–15.5)
WBC: 8.9 10*3/uL (ref 4.0–10.5)
nRBC: 0 % (ref 0.0–0.2)

## 2019-08-29 LAB — BASIC METABOLIC PANEL
Anion gap: 8 (ref 5–15)
BUN: 28 mg/dL — ABNORMAL HIGH (ref 8–23)
CO2: 35 mmol/L — ABNORMAL HIGH (ref 22–32)
Calcium: 9 mg/dL (ref 8.9–10.3)
Chloride: 93 mmol/L — ABNORMAL LOW (ref 98–111)
Creatinine, Ser: 1.46 mg/dL — ABNORMAL HIGH (ref 0.61–1.24)
GFR calc Af Amer: 57 mL/min — ABNORMAL LOW (ref >60)
GFR calc non Af Amer: 49 mL/min — ABNORMAL LOW (ref >60)
Glucose, Bld: 141 mg/dL — ABNORMAL HIGH (ref 70–99)
Potassium: 4.5 mmol/L (ref 3.5–5.1)
Sodium: 136 mmol/L (ref 135–145)

## 2019-08-29 LAB — ECHOCARDIOGRAM COMPLETE
Height: 73 in
Weight: 5152 oz

## 2019-08-29 LAB — SARS CORONAVIRUS 2 (TAT 6-24 HRS): SARS Coronavirus 2: NEGATIVE

## 2019-08-29 MED ORDER — RIVAROXABAN 20 MG PO TABS: 20.0000 mg | ORAL_TABLET | Freq: Every day | ORAL | Status: DC

## 2019-08-29 MED ORDER — RIVAROXABAN 20 MG PO TABS
20.0000 mg | ORAL_TABLET | Freq: Every day | ORAL | Status: DC
  Administered 2019-08-29 – 2019-08-31 (×3): 20 mg via ORAL
  Filled 2019-08-29 (×3): qty 1

## 2019-08-29 NOTE — ED Notes (Signed)
Diet was ordered for Lunch. 

## 2019-08-29 NOTE — Progress Notes (Signed)
   Saw Mr. Buxton at the bedside today - he had a 41 beat run of what appears to be monomorphic VT - he says he was completely asymptomatic with this. Spontaneously returned to sinus. Echo is pending, however, concern for new systolic HF as a risk factor for sustained VT. He is on metoprolol 50 mg BID - BP soft and will not likely tolerate increased BB. If he has further episodes of sustained VT, would recommend amiodarone.  Pixie Casino, MD, [facility], [place] Director of the Advanced Lipid Disorders &  Cardiovascular Risk Reduction Clinic Diplomate of the American Board of Clinical Lipidology Attending Cardiologist  Direct Dial: [phone]  Fax: [phone]  Website:  [email or web address]

## 2019-08-29 NOTE — Progress Notes (Signed)
Patient BP 103/63.  Paged cardiology to confirm giving metoprolol. MD stated to give.

## 2019-08-29 NOTE — Progress Notes (Signed)
Per CCMD, pt had a 41 beat run of Vtach, HR went up to 190. Current HR 80-90s. Cardiology paged. Pt asymptomatic. Will continue to monitor.

## 2019-08-29 NOTE — Patient Outreach (Signed)
  [facility]) Care Management Chronic Special Needs Program    08/29/2019  Name: William Alvarado, DOB: [birth year]  MRN: [id number]   [name] is enrolled in a chronic special needs plan for Diabetes. Client admitted to [facility] on 08/28/19 with dx of acute heart failure Individualized care plan sent to [facility] for admission  RNCM will continue to follow  Peter Garter RN, Jackquline Denmark, [place] [facility] [phone]

## 2019-08-29 NOTE — Progress Notes (Signed)
Progress Note  Patient Name: William Alvarado Date of Encounter: 08/29/2019  Primary Cardiologist: Olga Millers, MD   Subjective   Patient is diuresing well. He feels less firm around his hips. Breathing mildly improved. No chest pain.  Inpatient Medications    Scheduled Meds: . furosemide  40 mg Intravenous BID  . insulin aspart  0-20 Units Subcutaneous TID WC  . metoprolol tartrate  50 mg Oral BID  . potassium chloride SA  20 mEq Oral BID  . pravastatin  20 mg Oral Daily  . rivaroxaban  20 mg Oral Daily  . sodium chloride flush  3 mL Intravenous Q12H   Continuous Infusions: . sodium chloride     PRN Meds: sodium chloride, acetaminophen, ALPRAZolam, ondansetron (ZOFRAN) IV, sodium chloride flush, zolpidem   Vital Signs    Vitals:   08/29/19 0130 08/29/19 0330 08/29/19 0400 08/29/19 0430  BP: 116/77 116/82 122/72 108/64  Pulse: 98 85 86 (!) 51  Resp: 19 (!) 22 (!) 23 (!) 21  Temp:      TempSrc:      SpO2: 96% 98% 98% 96%  Weight:      Height:        Intake/Output Summary (Last 24 hours) at 08/29/2019 0736 Last data filed at 08/29/2019 0713 Gross per 24 hour  Intake -  Output 4475 ml  Net -4475 ml   Last 3 Weights 08/28/2019 08/22/2019 08/21/2019  Weight (lbs) 322 lb 318 lb 12.8 oz 317 lb  Weight (kg) 146.058 kg 144.607 kg 143.79 kg      Telemetry    Afib: HR 80s, rare PVC - Personally Reviewed  ECG    No new - Personally Reviewed  Physical Exam   GEN: No acute distress.   Neck: +JVD Cardiac: Irreg Irreg, no murmurs, rubs, or gallops.  Respiratory: 2L O2; mild crackles at bases GI: Firm,  nontender MS: 1-2+ lower extremity edema; No deformity. Neuro:  Nonfocal  Psych: Normal affect   Labs    High Sensitivity Troponin:  No results for input(s): TROPONINIHS in the last 720 hours.    Chemistry Recent Labs  Lab 08/28/19 1257 08/28/19 2033  NA 135  --   K 4.5  --   CL 92*  --   CO2 34*  --   GLUCOSE 132*  --   BUN 26*  --    CREATININE 1.56*  --   CALCIUM 8.7*  --   PROT  --  7.7  ALBUMIN  --  3.1*  AST  --  16  ALT  --  11  ALKPHOS  --  77  BILITOT  --  1.1  GFRNONAA 46*  --   GFRAA 53*  --   ANIONGAP 9  --      Hematology Recent Labs  Lab 08/28/19 1257 08/29/19 0505  WBC 9.6 8.9  RBC 4.88 4.89  HGB 12.5* 12.6*  HCT 42.9 42.6  MCV 87.9 87.1  MCH 25.6* 25.8*  MCHC 29.1* 29.6*  RDW 19.8* 19.6*  PLT 333 324    BNP Recent Labs  Lab 08/28/19 1257  BNP 413.9*     DDimer No results for input(s): DDIMER in the last 168 hours.   Radiology    Dg Chest 2 View  Result Date: 08/28/2019 CLINICAL DATA:  Shortness of breath, recent change in diuretic EXAM: CHEST - 2 VIEW COMPARISON:  05/07/2019 FINDINGS: Cardiomegaly. Unchanged elevation of the left hemidiaphragm. Mild diffuse interstitial pulmonary opacity. The visualized skeletal structures  are unremarkable. IMPRESSION: Cardiomegaly with mild, diffuse interstitial pulmonary opacity, generally in keeping with edema. No focal airspace opacity. Electronically Signed   By: Eddie Candle M.D.   On: 08/28/2019 13:13    Cardiac Studies   Echo pending  Patient Profile     68 y.o. male with pmh of paroxysmal afib, PE on anticoagulation, HTN, mild aortic and mitral stenosis, chronic diastolic HF, and morbid obesity who was admitted for acute heart failure.   Assessment & Plan    Acute on Chronic Diastolic Heart Failure Patient presented with progressive sob, fatigue, and abdominal distention. He was seen by Dr. Stanford Breed last week and lasix was changed to Torsemide. He feels edema has worsened. BNP 413, CXR significant for edema. - Diuresing well with Lasix 40 mg BID - Echo results pending - daily weights - I/Os -4.4L since admission - AM labs pending - Continue Lopressor  Paroxysmal afib - Patient in rate controlled afib - will plan for possible DCCV after diuresis - a/c with Xarelto  PE - continue Xarelto  HTN  - Lopressor 50 mg BID    For questions or updates, please contact [facility] Please consult [email or web address] for contact info under        Signed, Shatonya Passon Ninfa Meeker, PA-C  08/29/2019, 7:36 AM

## 2019-08-29 NOTE — Consult Note (Signed)
   [facility] CM Inpatient Consult   08/29/2019  William Alvarado January 06, 1951 [id number]    HTA - CSNP (Chronic Special Needs Program):    Patient is currently active with [facility] [facility] for chronic disease management services.  Patient has been engaged by a [facility] (who is aware of this admission) for DM. Our community based plan of care has focused on disease management and community resource support.   Patient was admitted for acute heart failure.  Patient will receive a post hospital call and will be evaluated for assessments and disease process education.  Will follow disposition/ needs withInpatient TOC team member and make aware that [facility] [facility] following.   Of note, [facility] Care Management services does not replace or interfere with any services that are needed or arranged by inpatient case management or social work.    For additional questions, please contact:  Shaketha Jeon A. Waylynn Benefiel, BSN, RN-BC [facility] Liaison Cell: [phone]

## 2019-08-29 NOTE — Progress Notes (Signed)
  Echocardiogram 2D Echocardiogram has been performed.  William Alvarado [name] 08/29/2019, 4:20 PM

## 2019-08-29 NOTE — ED Notes (Signed)
Pt sat on side of bed to eat and pee

## 2019-08-29 NOTE — ED Notes (Signed)
Pt sitting on side of bed sleeping , states he can breath better

## 2019-08-29 NOTE — ED Notes (Signed)
ED TO INPATIENT HANDOFF REPORT  ED Nurse Name and Phone #: [phone]  S Name/Age/Gender William Alvarado 68 y.o. male Room/Bed: 058C/058C  Code Status   Code Status: Full Code  Home/SNF/Other Home Patient oriented to: self Is this baseline? Yes   Triage Complete: Triage complete  Chief Complaint Fluid Overload  Triage Note Pt recently switched diuretics per Dr. Jens Som and this new pill (pt doesn't know the name) is not working-pt having increased swelling in abdomen and legs with exertional shob.    Allergies No Known Allergies  Level of Care/Admitting Diagnosis ED Disposition    ED Disposition Condition Comment   Admit  Hospital Area: [facility] [[id number]]  Level of Care: Telemetry Cardiac [103]  Covid Evaluation: Asymptomatic Screening Protocol (No Symptoms)  Diagnosis: Acute on chronic heart failure [facility]) [[id number]]  Admitting Physician: Kela Millin  Attending Physician: Kela Millin  Estimated length of stay: 3 - 4 days  Certification:: I certify this patient will need inpatient services for at least 2 midnights  PT Class (Do Not Modify): Inpatient [101]  PT Acc Code (Do Not Modify): Private [1]       B Medical/Surgery History Past Medical History:  Diagnosis Date  . Acute respiratory failure with hypoxia (HCC)    Hospitalized with acute respiratory failure secondary to CAP, diastolic CHF, and pulmonary embolism  . AKI (acute kidney injury) (HCC)   . Allergy    allergic rhinitis  . Altered mental status   . Atrial fibrillation (HCC)   . Chronic anticoagulation 03/26/2017   Failed Coumadin (PE with therapeutic INR). Now on Xarelto  . Colon polyps   . Community acquired pneumonia of right lower lobe of lung   . COPD (chronic obstructive pulmonary disease) (HCC)   . Degenerative disc disease, lumbar   . Diabetes mellitus without complication (HCC)   . History of pulmonary embolus (PE) 03/26/2017  . Hypertension   .  Mitral regurgitation 12/26/2012  . Obesity (BMI 30-39.9) 12/09/2012  . PAF (paroxysmal atrial fibrillation) (HCC)    DCCV in 2014 with recurrent PAF in setting of respiratory failure June 2018. S/P DCCV 04/13/17 to NSR  . Persistent atrial fibrillation (HCC)   . Pneumonia ~ 2016  . Sepsis (HCC) 03/10/2017  . Sleep apnea   . Systolic murmur   . Type II diabetes mellitus (HCC)    Past Surgical History:  Procedure Laterality Date  . CARDIOVERSION N/A 01/09/2013   Procedure: CARDIOVERSION;  Surgeon: Pricilla Riffle, MD;  Location: [facility] ENDOSCOPY;  Service: Cardiovascular;  Laterality: N/A;  . CARDIOVERSION N/A 04/13/2017   Procedure: CARDIOVERSION;  Surgeon: Quintella Reichert, MD;  Location: [facility] ENDOSCOPY;  Service: Cardiovascular;  Laterality: N/A;  . TEE WITHOUT CARDIOVERSION N/A 01/09/2013   Procedure: TRANSESOPHAGEAL ECHOCARDIOGRAM (TEE);  Surgeon: Pricilla Riffle, MD;  Location: [facility] ENDOSCOPY;  Service: Cardiovascular;  Laterality: N/A;     A IV Location/Drains/Wounds Patient Lines/Drains/Airways Status   Active Line/Drains/Airways    Name:   Placement date:   Placement time:   Site:   Days:   Peripheral IV 08/28/19 Left Antecubital   08/28/19    1920    Antecubital   1   Wound / Incision (Open or Dehisced) 03/10/17 Other (Comment) Leg Posterior;Left Abrasion abrasion bed is red   03/10/17    1729    Leg   902          Intake/Output Last 24 hours  Intake/Output Summary (Last 24  hours) at 08/29/2019 1235 Last data filed at 08/29/2019 0941 Gross per 24 hour  Intake -  Output 3975 ml  Net -3975 ml    Labs/Imaging Results for orders placed or performed during the hospital encounter of 08/28/19 (from the past 48 hour(s))  Basic metabolic panel     Status: Abnormal   Collection Time: 08/28/19 12:57 PM  Result Value Ref Range   Sodium 135 135 - 145 mmol/L   Potassium 4.5 3.5 - 5.1 mmol/L   Chloride 92 (L) 98 - 111 mmol/L   CO2 34 (H) 22 - 32 mmol/L   Glucose, Bld 132 (H) 70 - 99 mg/dL   BUN  26 (H) 8 - 23 mg/dL   Creatinine, Ser 9.37 (H) 0.61 - 1.24 mg/dL   Calcium 8.7 (L) 8.9 - 10.3 mg/dL   GFR calc non Af Amer 46 (L) >60 mL/min   GFR calc Af Amer 53 (L) >60 mL/min   Anion gap 9 5 - 15    Comment: Performed at [facility], [address] [place], [place] [address]  CBC     Status: Abnormal   Collection Time: 08/28/19 12:57 PM  Result Value Ref Range   WBC 9.6 4.0 - 10.5 K/uL   RBC 4.88 4.22 - 5.81 MIL/uL   Hemoglobin 12.5 (L) 13.0 - 17.0 g/dL   HCT 68.1 15.7 - 26.2 %   MCV 87.9 80.0 - 100.0 fL   MCH 25.6 (L) 26.0 - 34.0 pg   MCHC 29.1 (L) 30.0 - 36.0 g/dL   RDW 03.5 (H) 59.7 - 41.6 %   Platelets 333 150 - 400 K/uL   nRBC 0.0 0.0 - 0.2 %    Comment: Performed at [facility], [address], [place], [place] [address]  Brain natriuretic peptide     Status: Abnormal   Collection Time: 08/28/19 12:57 PM  Result Value Ref Range   B Natriuretic Peptide 413.9 (H) 0.0 - 100.0 pg/mL    Comment: Performed at [facility], [address], [place], [place] [address]  Hepatic function panel     Status: Abnormal   Collection Time: 08/28/19  8:33 PM  Result Value Ref Range   Total Protein 7.7 6.5 - 8.1 g/dL   Albumin 3.1 (L) 3.5 - 5.0 g/dL   AST 16 15 - 41 U/L   ALT 11 0 - 44 U/L   Alkaline Phosphatase 77 38 - 126 U/L   Total Bilirubin 1.1 0.3 - 1.2 mg/dL   Bilirubin, Direct 0.3 (H) 0.0 - 0.2 mg/dL   Indirect Bilirubin 0.8 0.3 - 0.9 mg/dL    Comment: Performed at [facility], [address] [place], [place] [address]  Magnesium     Status: None   Collection Time: 08/28/19  8:33 PM  Result Value Ref Range   Magnesium 2.3 1.7 - 2.4 mg/dL    Comment: Performed at [facility], [address] [place], [place] [address]  HIV Antibody (routine testing w rflx)     Status: None   Collection Time: 08/28/19  8:33 PM  Result Value Ref Range   HIV Screen 4th Generation wRfx NON REACTIVE NON REACTIVE    Comment: Performed at [facility]  Lab, [address] [place], [place] [address]  TSH     Status: None   Collection Time: 08/28/19  8:33 PM  Result Value Ref Range   TSH 2.805 0.350 - 4.500 uIU/mL  Comment: Performed by a 3rd Generation assay with a functional sensitivity of <=0.01 uIU/mL. Performed at [facility], [address] [place], [place] [address]   CBG monitoring, ED     Status: Abnormal   Collection Time: 08/29/19  1:12 AM  Result Value Ref Range   Glucose-Capillary 158 (H) 70 - 99 mg/dL  SARS CORONAVIRUS 2 (TAT 6-24 HRS) Nasopharyngeal Nasopharyngeal Swab     Status: None   Collection Time: 08/29/19  1:15 AM   Specimen: Nasopharyngeal Swab  Result Value Ref Range   SARS Coronavirus 2 NEGATIVE NEGATIVE    Comment: (NOTE) SARS-CoV-2 target nucleic acids are NOT DETECTED. The SARS-CoV-2 RNA is generally detectable in upper and lower respiratory specimens during the acute phase of infection. Negative results do not preclude SARS-CoV-2 infection, do not rule out co-infections with other pathogens, and should not be used as the sole basis for treatment or other patient management decisions. Negative results must be combined with clinical observations, patient history, and epidemiological information. The expected result is Negative. Fact Sheet for Patients: HairSlick.nohttps://www.fda.gov/media/138098/download Fact Sheet for Healthcare Providers: quierodirigir.comhttps://www.fda.gov/media/138095/download This test is not yet approved or cleared by the [place] FDA and  has been authorized for detection and/or diagnosis of SARS-CoV-2 by FDA under an Emergency Use Authorization (EUA). This EUA will remain  in effect (meaning this test can be used) for the duration of the COVID-19 declaration under Section 56 4(b)(1) of the Act, 21 U.S.C. section 360bbb-3(b)(1), unless the authorization is terminated or revoked sooner. Performed at [facility], [address] [place], [place] [address]   CBC     Status: Abnormal    Collection Time: 08/29/19  5:05 AM  Result Value Ref Range   WBC 8.9 4.0 - 10.5 K/uL   RBC 4.89 4.22 - 5.81 MIL/uL   Hemoglobin 12.6 (L) 13.0 - 17.0 g/dL   HCT 09.842.6 11.939.0 - 14.752.0 %   MCV 87.1 80.0 - 100.0 fL   MCH 25.8 (L) 26.0 - 34.0 pg   MCHC 29.6 (L) 30.0 - 36.0 g/dL   RDW 82.919.6 (H) 56.211.5 - 13.015.5 %   Platelets 324 150 - 400 K/uL   nRBC 0.0 0.0 - 0.2 %    Comment: Performed at [facility], [address] [place], [place] [address]  CBG monitoring, ED     Status: Abnormal   Collection Time: 08/29/19  8:15 AM  Result Value Ref Range   Glucose-Capillary 122 (H) 70 - 99 mg/dL  Basic metabolic panel     Status: Abnormal   Collection Time: 08/29/19 10:30 AM  Result Value Ref Range   Sodium 136 135 - 145 mmol/L   Potassium 4.5 3.5 - 5.1 mmol/L   Chloride 93 (L) 98 - 111 mmol/L   CO2 35 (H) 22 - 32 mmol/L   Glucose, Bld 141 (H) 70 - 99 mg/dL   BUN 28 (H) 8 - 23 mg/dL   Creatinine, Ser 4.691.46 (H) 0.61 - 1.24 mg/dL   Calcium 9.0 8.9 - 62.910.3 mg/dL   GFR calc non Af Amer 49 (L) >60 mL/min   GFR calc Af Amer 57 (L) >60 mL/min   Anion gap 8 5 - 15    Comment: Performed at [facility], [address] [place], [place] [address]   Dg Chest 2 View  Result Date: 08/28/2019 CLINICAL DATA:  Shortness of breath, recent change in diuretic EXAM: CHEST - 2 VIEW COMPARISON:  05/07/2019 FINDINGS: Cardiomegaly.  Unchanged elevation of the left hemidiaphragm. Mild diffuse interstitial pulmonary opacity. The visualized skeletal structures are unremarkable. IMPRESSION: Cardiomegaly with mild, diffuse interstitial pulmonary opacity, generally in keeping with edema. No focal airspace opacity. Electronically Signed   By: Eddie Candle M.D.   On: 08/28/2019 13:13    Pending Labs Unresulted Labs (From admission, onward)    Start     Ordered   08/30/19 0500  CBC  Tomorrow morning,   R     08/29/19 0820   08/29/19 6967  Basic metabolic panel  Daily,   R     08/29/19 0820           Vitals/Pain Today's Vitals   08/29/19 0930 08/29/19 0945 08/29/19 1000 08/29/19 1200  BP: 126/90 119/68 115/80 112/81  Pulse:      Resp:    15  Temp:      TempSrc:      SpO2:    96%  Weight:      Height:      PainSc:        Isolation Precautions No active isolations  Medications Medications  metoprolol tartrate (LOPRESSOR) tablet 50 mg (50 mg Oral Given 08/29/19 1121)  pravastatin (PRAVACHOL) tablet 20 mg (20 mg Oral Given 08/29/19 1121)  rivaroxaban (XARELTO) tablet 20 mg (20 mg Oral Not Given 08/29/19 1123)  potassium chloride SA (KLOR-CON) CR tablet 20 mEq (20 mEq Oral Given 08/29/19 1121)  sodium chloride flush (NS) 0.9 % injection 3 mL (3 mLs Intravenous Given 08/28/19 2251)  sodium chloride flush (NS) 0.9 % injection 3 mL (has no administration in time range)  0.9 %  sodium chloride infusion (has no administration in time range)  acetaminophen (TYLENOL) tablet 650 mg (has no administration in time range)  ondansetron (ZOFRAN) injection 4 mg (has no administration in time range)  furosemide (LASIX) injection 40 mg (40 mg Intravenous Given 08/29/19 0914)  zolpidem (AMBIEN) tablet 5 mg (has no administration in time range)  ALPRAZolam (XANAX) tablet 0.25 mg (has no administration in time range)  insulin aspart (novoLOG) injection 0-20 Units (3 Units Subcutaneous Given 08/29/19 0910)  sodium chloride flush (NS) 0.9 % injection 3 mL (3 mLs Intravenous Given 08/28/19 1925)  furosemide (LASIX) injection 80 mg (80 mg Intravenous Given 08/28/19 2251)    Mobility walks with device Low fall risk   Focused Assessments Cardiac Assessment Handoff:  Cardiac Rhythm: Junctional rhythm Lab Results  Component Value Date   TROPONINI 0.05 (HH) 12/09/2017   Lab Results  Component Value Date   DDIMER 1.57 (H) 03/12/2017   Does the Patient currently have chest pain? No     R Recommendations: See Admitting Provider Note  Report given to:   Additional Notes: Wife at bedside.

## 2019-08-29 NOTE — ED Notes (Signed)
Meal tray delivered to bedside

## 2019-08-29 NOTE — ED Notes (Signed)
Tele   Breakfast ordered  

## 2019-08-29 NOTE — Discharge Instructions (Addendum)
Information on my medicine - XARELTO (Rivaroxaban)  This medication education was reviewed with me or my healthcare representative as part of my discharge preparation.   Why was Xarelto prescribed for you? Xarelto was prescribed for you to reduce the risk of a blood clot forming that can cause a stroke if you have a medical condition called atrial fibrillation (a type of irregular heartbeat).  What do you need to know about xarelto ? Take your Xarelto ONCE DAILY at the same time every day with your evening meal. If you have difficulty swallowing the tablet whole, you may crush it and mix in applesauce just prior to taking your dose.  Take Xarelto exactly as prescribed by your doctor and DO NOT stop taking Xarelto without talking to the doctor who prescribed the medication.  Stopping without other stroke prevention medication to take the place of Xarelto may increase your risk of developing a clot that causes a stroke.  Refill your prescription before you run out.  After discharge, you should have regular check-up appointments with your healthcare provider that is prescribing your Xarelto.  In the future your dose may need to be changed if your kidney function or weight changes by a significant amount.  What do you do if you miss a dose? If you are taking Xarelto ONCE DAILY and you miss a dose, take it as soon as you remember on the same day then continue your regularly scheduled once daily regimen the next day. Do not take two doses of Xarelto at the same time or on the same day.   Important Safety Information A possible side effect of Xarelto is bleeding. You should call your healthcare provider right away if you experience any of the following: ? Bleeding from an injury or your nose that does not stop. ? Unusual colored urine (red or dark brown) or unusual colored stools (red or black). ? Unusual bruising for unknown reasons. ? A serious fall or if you hit your head (even if  there is no bleeding).  Some medicines may interact with Xarelto and might increase your risk of bleeding while on Xarelto. To help avoid this, consult your healthcare provider or pharmacist prior to using any new prescription or non-prescription medications, including herbals, vitamins, non-steroidal anti-inflammatory drugs (NSAIDs) and supplements.  This website has more information on Xarelto: [email or web address].   Low Sodium Nutrition Therapy  Eating less sodium can help you if you have high blood pressure, heart failure, or kidney or liver disease.   Your body needs a little sodium, but too much sodium can cause your body to hold onto extra water. This extra water will raise your blood pressure and can cause damage to your heart, kidneys, or liver as they are forced to work harder.   Sometimes you can see how the extra fluid affects you because your hands, legs, or belly swell. You may also hold water around your heart and lungs, which makes it hard to breathe.   Even if you take medication for blood pressure or a water pill (diuretic) to remove fluid, it is still important to have less salt in your diet.   Check with your primary care provider before drinking alcohol since it may affect the amount of fluid in your body and how your heart, kidneys, or liver work. Sodium in Food A low-sodium meal plan limits the sodium that you get from food and beverages to 1,500-2,000 milligrams (mg) per day. Salt is the main source of sodium. Read the  nutrition label on the package to find out how much sodium is in one serving of a food.   Select foods with 140 milligrams (mg) of sodium or less per serving.   You may be able to eat one or two servings of foods with a little more than 140 milligrams (mg) of sodium if you are closely watching how much sodium you eat in a day.   Check the serving size on the label. The amount of sodium listed on the label shows the amount in one serving of the food. So, if  you eat more than one serving, you will get more sodium than the amount listed.  Tips Cutting Back on Sodium  Eat more fresh foods.   Fresh fruits and vegetables are low in sodium, as well as frozen vegetables and fruits that have no added juices or sauces.   Fresh meats are lower in sodium than processed meats, such as bacon, sausage, and hotdogs.   Not all processed foods are unhealthy, but some processed foods may have too much sodium.   Eat less salt at the table and when cooking. One of the ingredients in salt is sodium.   One teaspoon of table salt has 2,300 milligrams of sodium.   Leave the salt out of recipes for pasta, casseroles, and soups.  Be a Paramedic.   Food packages that say Salt-free, sodium-free, very low sodium, and low sodium have less than 140 milligrams of sodium per serving.   Beware of products identified as Unsalted, No Salt Added, Reduced Sodium, or Lower Sodium. These items may still be high in sodium. You should always check the nutrition label.  Add flavors to your food without adding sodium.   Try lemon juice, lime juice, or vinegar.   Dry or fresh herbs add flavor.   Buy a sodium-free seasoning blend or make your own at home.  You can purchase salt-free or sodium-free condiments like barbeque sauce in stores and online. Ask your registered dietitian nutritionist for recommendations and where to find them.    Eating in Restaurants  Choose foods carefully when you eat outside your home. Restaurant foods can be very high in sodium. Many restaurants provide nutrition facts on their menus or their websites. If you cannot find that information, ask your server. Let your server know that you want your food to be cooked without salt and that you would like your salad dressing and sauces to be served on the side.      Foods Recommended  Food Group  Foods Recommended   Grains  Bread, bagels, rolls without salted tops Homemade  bread made with reduced-sodium baking powder Cold cereals, especially shredded wheat and puffed rice Oats, grits, or cream of wheat Pastas, quinoa, and rice Popcorn, pretzels or crackers without salt Corn tortillas   Protein Foods  Fresh meats and fish; [place] bacon (check the nutrition labels - make sure they are not packaged in a sodium solution) Canned or packed tuna (no more than 4 ounces at 1 serving) Beans and peas Soybeans) and tofu Eggs Nuts or nut butters without salt   Dairy  Milk or milk powder Plant milks, such as rice and soy Yogurt, including Greek yogurt Small amounts of natural cheese (blocks of cheese) or reduced-sodium cheese can be used in moderation. (Swiss, ricotta, and fresh mozzarella cheese are lower in sodium than the others) Cream Cheese Low sodium cottage cheese   Vegetables  Fresh and frozen vegetables without added sauces or salt  Homemade soups (without salt) Low-sodium, salt-free or sodium-free canned vegetables and soups   Fruit  Fresh and canned fruits Dried fruits, such as raisins, cranberries, and prunes   Oils  Tub or liquid margarine, regular or without salt [place]ola, corn, peanut, olive, safflower, or sunflower oils   Condiments  Fresh or dried herbs such as basil, bay leaf, dill, mustard (dry), nutmeg, paprika, parsley, rosemary, sage, or thyme.  Low sodium ketchup Vinegar  Lemon or lime juice Pepper, red pepper flakes, and cayenne. Hot sauce contains sodium, but if you use just a drop or two, it will not add up to much.  Salt-free or sodium-free seasoning mixes and marinades Simple salad dressings: vinegar and oil     Foods Not Recommended  Food Group  Foods Not Recommended   Grains  Breads or crackers topped with salt Cereals (hot/cold) with more than 300 mg sodium per serving Biscuits, cornbread, and other quick breads prepared with baking soda Pre-packaged bread crumbs Seasoned and packaged rice and pasta  mixes Self-rising flours   Protein Foods  Cured meats: Bacon, ham, sausage, pepperoni and hot dogs Canned meats (chili, vienna sausage, or sardines) Smoked fish and meats Frozen meals that have more than 600 mg of sodium per serving Egg substitute (with added sodium)   Dairy  Buttermilk Processed cheese spreads Cottage cheese (1 cup may have over 500 mg of sodium; look for low-sodium.) American or feta cheese Shredded Cheese has more sodium than blocks of cheese String cheese   Vegetables  Canned vegetables (unless they are salt-free, sodium-free or low sodium) Frozen vegetables with seasoning and sauces Sauerkraut and pickled vegetables Canned or dried soups (unless they are salt-free, sodium-free, or low sodium) [place] fries and onion rings   Fruit  Dried fruits preserved with additives that have sodium   Oils  Salted butter or margarine, all types of olives   Condiments  Salt, sea salt, kosher salt, onion salt, and garlic salt Seasoning mixes with salt Bouillon cubes Ketchup Barbeque sauce and Worcestershire sauce unless low sodium Soy sauce Salsa, pickles, olives, relish Salad dressings: ranch, blue cheese, [place], and [place].     Low Sodium Sample 1-Day Menu   Breakfast  1 cup cooked oatmeal   1 slice whole wheat bread toast   1 tablespoon peanut butter without salt   1 banana   1 cup 1% milk   Lunch  Tacos made with: 2 corn tortillas    cup black beans, low sodium    cup roasted or grilled chicken (without skin)    avocado   Squeeze of lime juice   1 cup salad greens   1 tablespoon low-sodium salad dressing    cup strawberries   1 orange   Afternoon Snack  1/3 cup grapes   6 ounces yogurt   Evening Meal  3 ounces herb-baked fish   1 baked potato   2 teaspoons olive oil    cup cooked carrots   2 thick slices tomatoes on:   2 lettuce leaves   1 teaspoon olive oil   1 teaspoon balsamic vinegar   1 cup 1% milk    Evening Snack  1 apple    cup almonds without salt     Low-Sodium Vegetarian (Lacto-Ovo) Sample 1-Day Menu   Breakfast  1 cup cooked oatmeal   1 slice whole wheat toast   1 tablespoon peanut butter without salt   1 banana   1 cup 1% milk   Lunch  Tacos  made with: 2 corn tortillas    cup black beans, low sodium    cup roasted or grilled chicken (without skin)    avocado   Squeeze of lime juice   1 cup salad greens   1 tablespoon low-sodium salad dressing    cup strawberries   1 orange   Evening Meal  Stir fry made with:  cup tofu   1 cup brown rice    cup broccoli    cup green beans    cup peppers    tablespoon peanut oil   1 orange   1 cup 1% milk   Evening Snack  4 strips celery   2 tablespoons hummus   1 hard-boiled egg     Low-Sodium Vegan Sample 1-Day Menu   Breakfast  1 cup cooked oatmeal   1 tablespoon peanut butter without salt   1 cup blueberries   1 cup soymilk fortified with calcium, vitamin B12, and vitamin D   Lunch  1 small whole wheat pita    cup cooked lentils   2 tablespoons hummus   4 carrot sticks   1 medium apple   1 cup soymilk fortified with calcium, vitamin B12, and vitamin D   Evening Meal  Stir fry made with:  cup tofu   1 cup brown rice    cup broccoli    cup green beans    cup peppers    tablespoon peanut oil   1 cup cantaloupe   Evening Snack  1 cup soy yogurt    cup mixed nuts   Copyright 2020  Academy of Nutrition and Dietetics. All rights reserved    Sodium Free Flavoring Tips    When cooking, the following items may be used for flavoring instead of salt or seasonings that contain sodium.  Remember: A little bit of spice goes a long way! Be careful not to overseason.  Spice Blend Recipe (makes about ? cup)  5 teaspoons onion powder   2 teaspoons garlic powder   2 teaspoons paprika   2 teaspoon dry mustard   1 teaspoon crushed thyme  leaves    teaspoon white pepper    teaspoon celery seed Food Item Flavorings  Beef Basil, bay leaf, caraway, curry, dill, dry mustard, garlic, grape jelly, green pepper, mace, marjoram, mushrooms (fresh), nutmeg, onion or onion powder, parsley, pepper, rosemary, sage  Chicken Basil, cloves, cranberries, mace, mushrooms (fresh), nutmeg, oregano, paprika, parsley, pineapple, saffron, sage, savory, tarragon, thyme, tomato, turmeric  Egg Chervil, curry, dill, dry mustard, garlic or garlic powder, green pepper, jelly, mushrooms (fresh), nutmeg, onion powder, paprika, parsley, rosemary, tarragon, tomato  Fish Basil, bay leaf, chervil, curry, dill, dry mustard, green pepper, lemon juice, marjoram, mushrooms (fresh), paprika, pepper, tarragon, tomato, turmeric  Lamb Cloves, curry, dill, garlic or garlic powder, mace, mint, mint jelly, onion, oregano, parsley, pineapple, rosemary, tarragon, thyme  Pork Applesauce, basil, caraway, chives, cloves, garlic or garlic powder, onion or onion powder, rosemary, thyme  Veal Apricots, basil, bay leaf, currant jelly, curry, ginger, marjoram, mushrooms (fresh), oregano, paprika  Vegetables Basil, dill, garlic or garlic powder, ginger, lemon juice, mace, marjoram, nutmeg, onion or onion powder, tarragon, tomato, sugar or sugar substitute, salt-free salad dressing, vinegar  Desserts Allspice, anise, cinnamon, cloves, ginger, mace, nutmeg, vanilla extract, other extracts   Copyright 2020  Academy of Nutrition and Dietetics. All rights reserved

## 2019-08-30 ENCOUNTER — Ambulatory Visit: Payer: HMO | Admitting: Physician Assistant

## 2019-08-30 DIAGNOSIS — I4819 Other persistent atrial fibrillation: Secondary | ICD-10-CM | POA: Diagnosis not present

## 2019-08-30 DIAGNOSIS — I5033 Acute on chronic diastolic (congestive) heart failure: Secondary | ICD-10-CM | POA: Diagnosis not present

## 2019-08-30 DIAGNOSIS — I472 Ventricular tachycardia: Secondary | ICD-10-CM | POA: Diagnosis not present

## 2019-08-30 LAB — BASIC METABOLIC PANEL
Anion gap: 10 (ref 5–15)
BUN: 26 mg/dL — ABNORMAL HIGH (ref 8–23)
CO2: 35 mmol/L — ABNORMAL HIGH (ref 22–32)
Calcium: 9 mg/dL (ref 8.9–10.3)
Chloride: 90 mmol/L — ABNORMAL LOW (ref 98–111)
Creatinine, Ser: 1.47 mg/dL — ABNORMAL HIGH (ref 0.61–1.24)
GFR calc Af Amer: 57 mL/min — ABNORMAL LOW (ref >60)
GFR calc non Af Amer: 49 mL/min — ABNORMAL LOW (ref >60)
Glucose, Bld: 156 mg/dL — ABNORMAL HIGH (ref 70–99)
Potassium: 4.9 mmol/L (ref 3.5–5.1)
Sodium: 135 mmol/L (ref 135–145)

## 2019-08-30 LAB — MAGNESIUM: Magnesium: 2.3 mg/dL (ref 1.7–2.4)

## 2019-08-30 LAB — GLUCOSE, CAPILLARY
Glucose-Capillary: 138 mg/dL — ABNORMAL HIGH (ref 70–99)
Glucose-Capillary: 132 mg/dL — ABNORMAL HIGH (ref 70–99)
Glucose-Capillary: 137 mg/dL — ABNORMAL HIGH (ref 70–99)
Glucose-Capillary: 131 mg/dL — ABNORMAL HIGH (ref 70–99)

## 2019-08-30 LAB — CBC
HCT: 41.7 % (ref 39.0–52.0)
Hemoglobin: 12.1 g/dL — ABNORMAL LOW (ref 13.0–17.0)
MCH: 25.4 pg — ABNORMAL LOW (ref 26.0–34.0)
MCHC: 29 g/dL — ABNORMAL LOW (ref 30.0–36.0)
MCV: 87.6 fL (ref 80.0–100.0)
Platelets: 347 10*3/uL (ref 150–400)
RBC: 4.76 MIL/uL (ref 4.22–5.81)
RDW: 19.4 % — ABNORMAL HIGH (ref 11.5–15.5)
WBC: 9.7 10*3/uL (ref 4.0–10.5)
nRBC: 0 % (ref 0.0–0.2)

## 2019-08-30 MED ORDER — BACITRACIN ZINC 500 UNIT/GM EX OINT
TOPICAL_OINTMENT | Freq: Two times a day (BID) | CUTANEOUS | Status: DC
  Administered 2019-08-30 – 2019-08-31 (×4): via TOPICAL
  Filled 2019-08-30: qty 28.4

## 2019-08-30 MED ORDER — AMIODARONE HCL 200 MG PO TABS
400.0000 mg | ORAL_TABLET | Freq: Two times a day (BID) | ORAL | Status: DC
  Administered 2019-08-30 – 2019-09-01 (×5): 400 mg via ORAL
  Filled 2019-08-30 (×5): qty 2

## 2019-08-30 NOTE — Consult Note (Signed)
WOC Nurse Consult Note: Reason for Consult: Left great toe (s/p nail removal by PCP) and bilateral Unna's Boots).  Wife and patient are excellent historians. Wound type: venous insufficiency, wound (acute) Pressure Injury POA: Yes/No/NA Measurement: Five (5) intact blisters (serum-filled) on R lateral LE, the largest measures 1.5cm x 1.8cm.  No wounds. LLE with great toe  Wound from nail removal, healing Left lateral LE with ruptured blister measuring 1.5cm x 1cm x 0.1cm Wound bed:See above Drainage (amount, consistency, odor) serous from LLE in a small amount Periwound: Bilateral LEs with hemosiderin staining.  Right foot with benign growth that is known by provider.   Dressing procedure/placement/frequency: I will resume the bilateral Unna's Boot and schedule for twice weekly with Ortho Tech as that is the patient's routine. Has [facility]) for this service at home.  Daily care to continue with antibiotic ointment and dry dressing daily.  [place] nursing team will not follow, but will remain available to this patient, the nursing and medical teams.  Please re-consult if needed. Thanks, Maudie Flakes, MSN, RN, [place], Arther Abbott  Pager# [phone]

## 2019-08-30 NOTE — Plan of Care (Signed)

## 2019-08-30 NOTE — Progress Notes (Signed)
Progress Note  Patient Name: William Alvarado Date of Encounter: 08/30/2019  Primary Cardiologist: Olga Millers, MD   Subjective   Noted 41 beat run of sustained VT yesterday - spontaneously aborted, asymptomatic. BP has been low overnight. Echo yesterday showed normal LVEF 55%, mild RVE with normal systolic function, no aortic stenosis. Creatinine stable with diuresis - negative 2.3L yesterday, now 5.5L negative. Potassium 4.9 today, magnesium was 2.3 yesterday.  Inpatient Medications    Scheduled Meds: . furosemide  40 mg Intravenous BID  . insulin aspart  0-20 Units Subcutaneous TID WC  . metoprolol tartrate  50 mg Oral BID  . potassium chloride SA  20 mEq Oral BID  . pravastatin  20 mg Oral Daily  . rivaroxaban  20 mg Oral Q supper  . sodium chloride flush  3 mL Intravenous Q12H   Continuous Infusions: . sodium chloride     PRN Meds: sodium chloride, acetaminophen, ALPRAZolam, ondansetron (ZOFRAN) IV, sodium chloride flush, zolpidem   Vital Signs    Vitals:   08/30/19 0100 08/30/19 0106 08/30/19 0348 08/30/19 0837  BP: 102/67  109/61 118/66  Pulse: 82  85 89  Resp: (!) 22  20 20   Temp: 97.8 F (36.6 C)  97.8 F (36.6 C) 98.5 F (36.9 C)  TempSrc: Oral  Oral Oral  SpO2: 100%  98% 100%  Weight:  (!) 148.4 kg    Height:        Intake/Output Summary (Last 24 hours) at 08/30/2019 0845 Last data filed at 08/30/2019 0820 Gross per 24 hour  Intake 720 ml  Output 1500 ml  Net -780 ml   Last 3 Weights 08/30/2019 08/28/2019 08/22/2019  Weight (lbs) 327 lb 1.6 oz 322 lb 318 lb 12.8 oz  Weight (kg) 148.372 kg 146.058 kg 144.607 kg      Telemetry    Afib with CVR, short run of NSVT this am (6 beats)- Personally Reviewed  ECG    N/A  Physical Exam   General appearance: alert, no distress and morbidly obese Neck: JVD - 2 cm above sternal notch, no carotid bruit and thyroid not enlarged, symmetric, no tenderness/mass/nodules Lungs: diminished breath sounds  bibasilar Heart: irregularly irregular rhythm Abdomen: morbidly obese, protuberant Extremities: edema 3+ edema with blisters, erythema Pulses: 2+ and symmetric Skin: chronic LE venous stasis changes Neurologic: Mental status: Alert, oriented, thought content appropriate Psych: Frustrated, wants to go home  Labs    High Sensitivity Troponin:  No results for input(s): TROPONINIHS in the last 720 hours.    Chemistry Recent Labs  Lab 08/28/19 1257 08/28/19 2033 08/29/19 1030 08/30/19 0337  NA 135  --  136 135  K 4.5  --  4.5 4.9  CL 92*  --  93* 90*  CO2 34*  --  35* 35*  GLUCOSE 132*  --  141* 156*  BUN 26*  --  28* 26*  CREATININE 1.56*  --  1.46* 1.47*  CALCIUM 8.7*  --  9.0 9.0  PROT  --  7.7  --   --   ALBUMIN  --  3.1*  --   --   AST  --  16  --   --   ALT  --  11  --   --   ALKPHOS  --  77  --   --   BILITOT  --  1.1  --   --   GFRNONAA 46*  --  49* 49*  GFRAA 53*  --  57*  [place]  --  8 10     Hematology Recent Labs  Lab 08/28/19 1257 08/29/19 0505 08/30/19 0337  WBC 9.6 8.9 9.7  RBC 4.88 4.89 4.76  HGB 12.5* 12.6* 12.1*  HCT 42.9 42.6 41.7  MCV 87.9 87.1 87.6  MCH 25.6* 25.8* 25.4*  MCHC 29.1* 29.6* 29.0*  RDW 19.8* 19.6* 19.4*  PLT 333 324 347    BNP Recent Labs  Lab 08/28/19 1257  BNP 413.9*     DDimer No results for input(s): DDIMER in the last 168 hours.   Radiology    Dg Chest 2 View  Result Date: 08/28/2019 CLINICAL DATA:  Shortness of breath, recent change in diuretic EXAM: CHEST - 2 VIEW COMPARISON:  05/07/2019 FINDINGS: Cardiomegaly. Unchanged elevation of the left hemidiaphragm. Mild diffuse interstitial pulmonary opacity. The visualized skeletal structures are unremarkable. IMPRESSION: Cardiomegaly with mild, diffuse interstitial pulmonary opacity, generally in keeping with edema. No focal airspace opacity. Electronically Signed   By: Eddie Candle M.D.   On: 08/28/2019 13:13    Cardiac Studies   Procedure: 2D Echo   Indications:    CHF-Acute Diastolic 626.94 / W54.62   History:        Patient has prior history of Echocardiogram examinations, most                 recent 05/02/2018. CHF, Arrythmias:Atrial Fibrillation,                 Signs/Symptoms:Hypotension; Risk Factors:Hypertension and                 Diabetes. Sepsis                 History of pulmonary embolus                 Morbid obesity                 Systolic murmur.   Sonographer:    Vikki Ports Turrentine Referring Phys: [id number] [place]    Sonographer Comments: Patient is morbidly obese. Image acquisition challenging due to patient body habitus. Technically difficult study due to patient wanting to remain sitting upright on edge of bed. IMPRESSIONS    1. Left ventricular ejection fraction, by visual estimation, is 55%. The left ventricle has normal function. There is no left ventricular hypertrophy.  2. Left ventricular diastolic parameters are indeterminate.  3. The left ventricle has no regional wall motion abnormalities.  4. Global right ventricle has normal systolic function.The right ventricular size is mildly enlarged. No increase in right ventricular wall thickness.  5. Left atrial size was moderately dilated.  6. Right atrial size was mildly dilated.  7. Moderate mitral annular calcification.  8. The mitral valve is degenerative. Trace mitral valve regurgitation. No evidence of mitral stenosis.  9. The tricuspid valve is normal in structure. Tricuspid valve regurgitation is trivial. 10. The aortic valve is tricuspid. Aortic valve regurgitation is trivial. Mild to moderate aortic valve sclerosis/calcification without any evidence of aortic stenosis. 11. Peak RV-RA gradient 16 mmHg. 12. Technically difficult study with poor acoustic windows. RV not well-visualized. 13. The patient appeared to be in atrial fibrillation.  Patient Profile     68 y.o. male with pmh of paroxysmal afib, PE on anticoagulation, HTN, mild  aortic and mitral stenosis, chronic diastolic HF, and morbid obesity who was admitted for acute diastolic heart failure.   Assessment & Plan    Acute on Chronic Diastolic Heart Failure  Patient presented with progressive sob, fatigue, and abdominal distention. He was seen by Dr. Jens Som last week and lasix was changed to Torsemide. He feels edema has worsened. BNP 413, CXR significant for edema. - Diuresing well with Lasix 40 mg IV BID - Echo shows normal systolic function and normal right heart function, etiology for decompensation may be afib - daily weights - I/Os -4.4L since admission - AM labs pending - Continue Lopressor  Paroxysmal afib - Patient in rate controlled afib - will plan for possible DCCV after diuresis, nay need AAD therapy - a/c with Xarelto  Sustained VT -noted to have a 41 beat run of monomorphic VT yesterday, asymptomatic, had another short run this am, lytes repleted - LVEF normal -Will d/w cardiac EP regarding recommendations - given hypotension, limited BB dosing -May be a candidate to add amiodarone to assist in maintenance of sinus rhythm if we pursue cardioversion as well as to suppress VT  PE - continue Xarelto 20 mg dailyZ  HTN  - Lopressor 50 mg BID  Hypokalemia - on repletion, consider adding aldactone  For questions or updates, please contact [facility] Please consult [email or web address] for contact info under     Chrystie Nose, MD, Milagros Loll  [place]  [facility]  Medical Director of the [facility]  [facility] Diplomate of the American Board of Clinical Lipidology Attending Cardiologist  Direct Dial: [phone]  Fax: [phone]  Website:  [email or web address]  Chrystie Nose, MD  08/30/2019, 8:45 AM

## 2019-08-30 NOTE — Progress Notes (Signed)
Patient's wife is at the bedside. She would like to speak to the physician to get an update on her husband's current condition; she also has questions for the physician as well.

## 2019-08-30 NOTE — Progress Notes (Signed)
Orthopedic Tech Progress Note Patient Details:  William Alvarado 19-Dec-1950 [id number]  Ortho Devices Type of Ortho Device: Ace wrap, Unna boot Ortho Device/Splint Location: Bilateral unna boots Ortho Device/Splint Interventions: Application   Post Interventions Patient Tolerated: Well Instructions Provided: Care of device   [place] Pink 08/30/2019, 11:15 AM

## 2019-08-31 ENCOUNTER — Ambulatory Visit: Payer: Self-pay

## 2019-08-31 ENCOUNTER — Encounter (HOSPITAL_COMMUNITY): Payer: Self-pay | Admitting: Internal Medicine

## 2019-08-31 DIAGNOSIS — I5033 Acute on chronic diastolic (congestive) heart failure: Secondary | ICD-10-CM | POA: Diagnosis not present

## 2019-08-31 DIAGNOSIS — I4819 Other persistent atrial fibrillation: Secondary | ICD-10-CM | POA: Diagnosis not present

## 2019-08-31 DIAGNOSIS — E875 Hyperkalemia: Secondary | ICD-10-CM

## 2019-08-31 DIAGNOSIS — I472 Ventricular tachycardia: Secondary | ICD-10-CM | POA: Diagnosis not present

## 2019-08-31 LAB — BASIC METABOLIC PANEL
Anion gap: 8 (ref 5–15)
BUN: 26 mg/dL — ABNORMAL HIGH (ref 8–23)
CO2: 33 mmol/L — ABNORMAL HIGH (ref 22–32)
Calcium: 8.8 mg/dL — ABNORMAL LOW (ref 8.9–10.3)
Chloride: 93 mmol/L — ABNORMAL LOW (ref 98–111)
Creatinine, Ser: 1.5 mg/dL — ABNORMAL HIGH (ref 0.61–1.24)
GFR calc Af Amer: 55 mL/min — ABNORMAL LOW (ref >60)
GFR calc non Af Amer: 48 mL/min — ABNORMAL LOW (ref >60)
Glucose, Bld: 139 mg/dL — ABNORMAL HIGH (ref 70–99)
Potassium: 5.2 mmol/L — ABNORMAL HIGH (ref 3.5–5.1)
Sodium: 134 mmol/L — ABNORMAL LOW (ref 135–145)

## 2019-08-31 LAB — GLUCOSE, CAPILLARY
Glucose-Capillary: 116 mg/dL — ABNORMAL HIGH (ref 70–99)
Glucose-Capillary: 130 mg/dL — ABNORMAL HIGH (ref 70–99)
Glucose-Capillary: 132 mg/dL — ABNORMAL HIGH (ref 70–99)
Glucose-Capillary: 119 mg/dL — ABNORMAL HIGH (ref 70–99)

## 2019-08-31 NOTE — H&P (View-Only) (Signed)
Progress Note  Patient Name: William Alvarado Date of Encounter: 08/31/2019  Primary Cardiologist: Olga MillersBrian Crenshaw, MD   Subjective   Continues to diurese, but slowing, now 5.8L negative - Creatinine up to 1.5 (from 1.47) - potassium 5.2 today. Remains in afib with CVR - now on amiodarone. No VT overnight.   Inpatient Medications    Scheduled Meds: . amiodarone  400 mg Oral BID  . bacitracin   Topical BID  . furosemide  40 mg Intravenous BID  . insulin aspart  0-20 Units Subcutaneous TID WC  . metoprolol tartrate  50 mg Oral BID  . potassium chloride SA  20 mEq Oral BID  . pravastatin  20 mg Oral Daily  . rivaroxaban  20 mg Oral Q supper  . sodium chloride flush  3 mL Intravenous Q12H   Continuous Infusions: . sodium chloride     PRN Meds: sodium chloride, acetaminophen, ALPRAZolam, ondansetron (ZOFRAN) IV, sodium chloride flush, zolpidem   Vital Signs    Vitals:   08/30/19 2122 08/31/19 0014 08/31/19 0256 08/31/19 0316  BP: (!) 108/57 108/82  99/66  Pulse: 83 81  77  Resp: 20 20  20   Temp: 98.2 F (36.8 C) 97.6 F (36.4 C)  97.8 F (36.6 C)  TempSrc: Oral Oral  Oral  SpO2: 96% 99%  93%  Weight:   (!) 149.1 kg   Height:        Intake/Output Summary (Last 24 hours) at 08/31/2019 0733 Last data filed at 08/31/2019 [id number] Gross per 24 hour  Intake 1800 ml  Output 2150 ml  Net -350 ml   Filed Weights   08/28/19 1210 08/30/19 0106 08/31/19 0256  Weight: (!) 146.1 kg (!) 148.4 kg (!) 149.1 kg    Physical Exam   General appearance: alert, no distress and morbidly obese Neck: JVD - 2 cm above sternal notch, no carotid bruit and thyroid not enlarged, symmetric, no tenderness/mass/nodules Lungs: diminished breath sounds bibasilar Heart: irregularly irregular rhythm Abdomen: soft, non-tender; bowel sounds normal; no masses,  no organomegaly and protuberant Extremities: edema 2+ stasis edema, wrapped Pulses: 2+ and symmetric Skin: bluish discoloration of  the toes, left great toe bandaged following recent nail removal Neurologic: Grossly normal Psych: [employer]ustrated   Labs    Chemistry Recent Labs  Lab 08/28/19 2033 08/29/19 1030 08/30/19 0337 08/31/19 0600  NA  --  136 135 134*  K  --  4.5 4.9 5.2*  CL  --  93* 90* 93*  CO2  --  35* 35* 33*  GLUCOSE  --  141* 156* 139*  BUN  --  28* 26* 26*  CREATININE  --  1.46* 1.47* 1.50*  CALCIUM  --  9.0 9.0 8.8*  PROT 7.7  --   --   --   ALBUMIN 3.1*  --   --   --   AST 16  --   --   --   ALT 11  --   --   --   ALKPHOS 77  --   --   --   BILITOT 1.1  --   --   --   GFRNONAA  --  49* 49* 48*  GFRAA  --  57* 57* 55*  ANIONGAP  --  8 10 8      Hematology Recent Labs  Lab 08/28/19 1257 08/29/19 0505 08/30/19 0337  WBC 9.6 8.9 9.7  RBC 4.88 4.89 4.76  HGB 12.5* 12.6* 12.1*  HCT 42.9 42.6  41.7  MCV 87.9 87.1 87.6  MCH 25.6* 25.8* 25.4*  MCHC 29.1* 29.6* 29.0*  RDW 19.8* 19.6* 19.4*  PLT 333 324 347    Cardiac EnzymesNo results for input(s): TROPONINI in the last 168 hours. No results for input(s): TROPIPOC in the last 168 hours.   BNP Recent Labs  Lab 08/28/19 1257  BNP 413.9*     DDimer No results for input(s): DDIMER in the last 168 hours.   Radiology    ECHOCARDIOGRAM COMPLETE  Result Date: 08/29/2019   ECHOCARDIOGRAM REPORT   Patient Name:   TERANCE WESTLAND Date of Exam: 08/29/2019 Medical Rec #:  [id number]         Height:       73.0 in Accession #:    [id number]        Weight:       322.0 lb Date of Birth:  Dec 15, 1950         BSA:          2.63 m Patient Age:    68 years          BP:           92/77 mmHg Patient Gender: M                 HR:           131 bpm. Exam Location:  Inpatient Procedure: 2D Echo Indications:    CHF-Acute Diastolic 428.31 / I50.31  History:        Patient has prior history of Echocardiogram examinations, most                 recent 05/02/2018. CHF, Arrythmias:Atrial Fibrillation,                 Signs/Symptoms:Hypotension; Risk  Factors:Hypertension and                 Diabetes. Sepsis                 History of pulmonary embolus                 Morbid obesity                 Systolic murmur.  Sonographer:    Leeroy Bock Turrentine Referring Phys: [id number] CADENCE H FURTH  Sonographer Comments: Patient is morbidly obese. Image acquisition challenging due to patient body habitus. Technically difficult study due to patient wanting to remain sitting upright on edge of bed. IMPRESSIONS  1. Left ventricular ejection fraction, by visual estimation, is 55%. The left ventricle has normal function. There is no left ventricular hypertrophy.  2. Left ventricular diastolic parameters are indeterminate.  3. The left ventricle has no regional wall motion abnormalities.  4. Global right ventricle has normal systolic function.The right ventricular size is mildly enlarged. No increase in right ventricular wall thickness.  5. Left atrial size was moderately dilated.  6. Right atrial size was mildly dilated.  7. Moderate mitral annular calcification.  8. The mitral valve is degenerative. Trace mitral valve regurgitation. No evidence of mitral stenosis.  9. The tricuspid valve is normal in structure. Tricuspid valve regurgitation is trivial. 10. The aortic valve is tricuspid. Aortic valve regurgitation is trivial. Mild to moderate aortic valve sclerosis/calcification without any evidence of aortic stenosis. 11. Peak RV-RA gradient 16 mmHg. 12. Technically difficult study with poor acoustic windows. RV not well-visualized. 13. The patient appeared to be in atrial fibrillation. FINDINGS  Left Ventricle: Left ventricular  ejection fraction, by visual estimation, is 55%. The left ventricle has normal function. The left ventricle has no regional wall motion abnormalities. The left ventricular internal cavity size was the left ventricle is normal in size. There is no left ventricular hypertrophy. Left ventricular diastolic parameters are indeterminate. Right Ventricle:  The right ventricular size is mildly enlarged. No increase in right ventricular wall thickness. Global RV systolic function is has normal systolic function. Left Atrium: Left atrial size was moderately dilated. Right Atrium: Right atrial size was mildly dilated Pericardium: There is no evidence of pericardial effusion. Mitral Valve: The mitral valve is degenerative in appearance. There is mild calcification of the mitral valve leaflet(s). Moderate mitral annular calcification. No evidence of mitral valve stenosis by observation. Trace mitral valve regurgitation. Tricuspid Valve: The tricuspid valve is normal in structure. Tricuspid valve regurgitation is trivial. Aortic Valve: The aortic valve is tricuspid. Aortic valve regurgitation is trivial. Mild to moderate aortic valve sclerosis/calcification is present, without any evidence of aortic stenosis. Pulmonic Valve: The pulmonic valve was normal in structure. Pulmonic valve regurgitation is not visualized. Aorta: The aortic root is normal in size and structure. Venous: The inferior vena cava was not well visualized. IAS/Shunts: No atrial level shunt detected by color flow Doppler.  LEFT VENTRICLE PLAX 2D LVIDd:         5.20 cm LVIDs:         3.60 cm LV PW:         1.20 cm LV IVS:        1.10 cm LVOT diam:     2.00 cm LV SV:         75 ml LV SV Index:   26.75 LVOT Area:     3.14 cm  LEFT ATRIUM         Index LA diam:    5.90 cm 2.24 cm/m   AORTA Ao Root diam: 3.10 cm TRICUSPID VALVE TR Peak grad:   16.6 mmHg TR Vmax:        204.00 cm/s  SHUNTS Systemic Diam: 2.00 cm  Marca Ancona MD Electronically signed by Marca Ancona MD Signature Date/Time: 08/29/2019/5:36:58 PM    Final     Telemetry    Afib with CVR, no VT, artifact- Personally Reviewed  ECG    N/A  Cardiac Studies   Echo 08/29/2019:  1. Left ventricular ejection fraction, by visual estimation, is 55%. The left ventricle has normal function. There is no left ventricular hypertrophy. 2. Left  ventricular diastolic parameters are indeterminate. 3. The left ventricle has no regional wall motion abnormalities. 4. Global right ventricle has normal systolic function.The right ventricular size is mildly enlarged. No increase in right ventricular wall thickness. 5. Left atrial size was moderately dilated. 6. Right atrial size was mildly dilated. 7. Moderate mitral annular calcification. 8. The mitral valve is degenerative. Trace mitral valve regurgitation. No evidence of mitral stenosis. 9. The tricuspid valve is normal in structure. Tricuspid valve regurgitation is trivial. 10. The aortic valve is tricuspid. Aortic valve regurgitation is trivial. Mild to moderate aortic valve sclerosis/calcification without any evidence of aortic stenosis. 11. Peak RV-RA gradient 16 mmHg. 12. Technically difficult study with poor acoustic windows. RV not well-visualized. 13. The patient appeared to be in atrial fibrillation.  Patient Profile     68 y.o. male with a hx of paroxysmal afib, PE on anticoagulation, HTN, mild aortic and mitral stenosis, chronic diastolic HF, and morbid obesity who was admitted for acute diastolic heart failure.  Assessment & Plan    1. Acute on ChronicDiastolicHeart Failure: -Per echo 08/29/2019 likely in the setting of AF>>patient presented with progressive sob, fatigue, and abdominal distention. Recent diuretic changes which include PO Lasix to Torsemide>>BNP 413, CXR significant for edema. -Diuresing well on IV Lasix 40 mg IV BID - continue IV today and plan transition to po torsemide tomorrow. -Weight, 328lb today (admission weight 322lb)  -I&O, net negative 5.8L  -Creatinine, 1.50, up from 1.47 yesterday   -Continue Lopressor  2. Paroxysmal afib: -Rate controlled  -now on amiodarone -arranged DCCV tomorrow at 2pm with Dr. Marlou Porch  3. Sustained VT: -Noted to have a 41 beat run of monomorphic VT 12/08>> asymptomatic -PO Amiodarone added to regimen 400mg   PO BID - this is now quiescent  4. PE: -Anticoagulated with Xarelto 20 mg daily, no missed doses  5. HTN: -Low, 99/66>108/82>108/57 -Limiting titration of BB for rate control  -Lopressor 50 mg BID  6. Hyperkalemia: -Hold repletion today and monitor with BMET in AM  -K+, 5.2 today  For questions or updates, please contact   Please consult [email or web address] for contact info under Cardiology/STEMI.   Pixie Casino, MD, [facility], [place] Director of the Advanced Lipid Disorders &  Cardiovascular Risk Reduction Clinic Diplomate of the American Board of Clinical Lipidology Attending Cardiologist  Direct Dial: [phone]  Fax: [phone]  Website:  [email or web address]  08/31/2019, 7:33 AM

## 2019-08-31 NOTE — Progress Notes (Signed)
   Progress Note  Patient Name: William Alvarado Date of Encounter: 08/31/2019  Primary Cardiologist: Brian Crenshaw, MD   Subjective   Continues to diurese, but slowing, now 5.8L negative - Creatinine up to 1.5 (from 1.47) - potassium 5.2 today. Remains in afib with CVR - now on amiodarone. No VT overnight.   Inpatient Medications    Scheduled Meds: . amiodarone  400 mg Oral BID  . bacitracin   Topical BID  . furosemide  40 mg Intravenous BID  . insulin aspart  0-20 Units Subcutaneous TID WC  . metoprolol tartrate  50 mg Oral BID  . potassium chloride SA  20 mEq Oral BID  . pravastatin  20 mg Oral Daily  . rivaroxaban  20 mg Oral Q supper  . sodium chloride flush  3 mL Intravenous Q12H   Continuous Infusions: . sodium chloride     PRN Meds: sodium chloride, acetaminophen, ALPRAZolam, ondansetron (ZOFRAN) IV, sodium chloride flush, zolpidem   Vital Signs    Vitals:   08/30/19 2122 08/31/19 0014 08/31/19 0256 08/31/19 0316  BP: (!) 108/57 108/82  99/66  Pulse: 83 81  77  Resp: 20 20  20  Temp: 98.2 F (36.8 C) 97.6 F (36.4 C)  97.8 F (36.6 C)  TempSrc: Oral Oral  Oral  SpO2: 96% 99%  93%  Weight:   (!) 149.1 kg   Height:        Intake/Output Summary (Last 24 hours) at 08/31/2019 0733 Last data filed at 08/31/2019 0616 Gross per 24 hour  Intake 1800 ml  Output 2150 ml  Net -350 ml   Filed Weights   08/28/19 1210 08/30/19 0106 08/31/19 0256  Weight: (!) 146.1 kg (!) 148.4 kg (!) 149.1 kg    Physical Exam   General appearance: alert, no distress and morbidly obese Neck: JVD - 2 cm above sternal notch, no carotid bruit and thyroid not enlarged, symmetric, no tenderness/mass/nodules Lungs: diminished breath sounds bibasilar Heart: irregularly irregular rhythm Abdomen: soft, non-tender; bowel sounds normal; no masses,  no organomegaly and protuberant Extremities: edema 2+ stasis edema, wrapped Pulses: 2+ and symmetric Skin: bluish discoloration of  the toes, left great toe bandaged following recent nail removal Neurologic: Grossly normal Psych: Frustrated   Labs    Chemistry Recent Labs  Lab 08/28/19 2033 08/29/19 1030 08/30/19 0337 08/31/19 0600  NA  --  136 135 134*  K  --  4.5 4.9 5.2*  CL  --  93* 90* 93*  CO2  --  35* 35* 33*  GLUCOSE  --  141* 156* 139*  BUN  --  28* 26* 26*  CREATININE  --  1.46* 1.47* 1.50*  CALCIUM  --  9.0 9.0 8.8*  PROT 7.7  --   --   --   ALBUMIN 3.1*  --   --   --   AST 16  --   --   --   ALT 11  --   --   --   ALKPHOS 77  --   --   --   BILITOT 1.1  --   --   --   GFRNONAA  --  49* 49* 48*  GFRAA  --  57* 57* 55*  ANIONGAP  --  8 10 8     Hematology Recent Labs  Lab 08/28/19 1257 08/29/19 0505 08/30/19 0337  WBC 9.6 8.9 9.7  RBC 4.88 4.89 4.76  HGB 12.5* 12.6* 12.1*  HCT 42.9 42.6   41.7  MCV 87.9 87.1 87.6  MCH 25.6* 25.8* 25.4*  MCHC 29.1* 29.6* 29.0*  RDW 19.8* 19.6* 19.4*  PLT 333 324 347    Cardiac EnzymesNo results for input(s): TROPONINI in the last 168 hours. No results for input(s): TROPIPOC in the last 168 hours.   BNP Recent Labs  Lab 08/28/19 1257  BNP 413.9*     DDimer No results for input(s): DDIMER in the last 168 hours.   Radiology    ECHOCARDIOGRAM COMPLETE  Result Date: 08/29/2019   ECHOCARDIOGRAM REPORT   Patient Name:   William Alvarado Date of Exam: 08/29/2019 Medical Rec #:  [id number]         Height:       73.0 in Accession #:    [id number]        Weight:       322.0 lb Date of Birth:  Dec 15, 1950         BSA:          2.63 m Patient Age:    68 years          BP:           92/77 mmHg Patient Gender: M                 HR:           131 bpm. Exam Location:  Inpatient Procedure: 2D Echo Indications:    CHF-Acute Diastolic 428.31 / I50.31  History:        Patient has prior history of Echocardiogram examinations, most                 recent 05/02/2018. CHF, Arrythmias:Atrial Fibrillation,                 Signs/Symptoms:Hypotension; Risk  Factors:Hypertension and                 Diabetes. Sepsis                 History of pulmonary embolus                 Morbid obesity                 Systolic murmur.  Sonographer:    Leeroy Bock Turrentine Referring Phys: [id number] CADENCE H FURTH  Sonographer Comments: Patient is morbidly obese. Image acquisition challenging due to patient body habitus. Technically difficult study due to patient wanting to remain sitting upright on edge of bed. IMPRESSIONS  1. Left ventricular ejection fraction, by visual estimation, is 55%. The left ventricle has normal function. There is no left ventricular hypertrophy.  2. Left ventricular diastolic parameters are indeterminate.  3. The left ventricle has no regional wall motion abnormalities.  4. Global right ventricle has normal systolic function.The right ventricular size is mildly enlarged. No increase in right ventricular wall thickness.  5. Left atrial size was moderately dilated.  6. Right atrial size was mildly dilated.  7. Moderate mitral annular calcification.  8. The mitral valve is degenerative. Trace mitral valve regurgitation. No evidence of mitral stenosis.  9. The tricuspid valve is normal in structure. Tricuspid valve regurgitation is trivial. 10. The aortic valve is tricuspid. Aortic valve regurgitation is trivial. Mild to moderate aortic valve sclerosis/calcification without any evidence of aortic stenosis. 11. Peak RV-RA gradient 16 mmHg. 12. Technically difficult study with poor acoustic windows. RV not well-visualized. 13. The patient appeared to be in atrial fibrillation. FINDINGS  Left Ventricle: Left ventricular  ejection fraction, by visual estimation, is 55%. The left ventricle has normal function. The left ventricle has no regional wall motion abnormalities. The left ventricular internal cavity size was the left ventricle is normal in size. There is no left ventricular hypertrophy. Left ventricular diastolic parameters are indeterminate. Right Ventricle:  The right ventricular size is mildly enlarged. No increase in right ventricular wall thickness. Global RV systolic function is has normal systolic function. Left Atrium: Left atrial size was moderately dilated. Right Atrium: Right atrial size was mildly dilated Pericardium: There is no evidence of pericardial effusion. Mitral Valve: The mitral valve is degenerative in appearance. There is mild calcification of the mitral valve leaflet(s). Moderate mitral annular calcification. No evidence of mitral valve stenosis by observation. Trace mitral valve regurgitation. Tricuspid Valve: The tricuspid valve is normal in structure. Tricuspid valve regurgitation is trivial. Aortic Valve: The aortic valve is tricuspid. Aortic valve regurgitation is trivial. Mild to moderate aortic valve sclerosis/calcification is present, without any evidence of aortic stenosis. Pulmonic Valve: The pulmonic valve was normal in structure. Pulmonic valve regurgitation is not visualized. Aorta: The aortic root is normal in size and structure. Venous: The inferior vena cava was not well visualized. IAS/Shunts: No atrial level shunt detected by color flow Doppler.  LEFT VENTRICLE PLAX 2D LVIDd:         5.20 cm LVIDs:         3.60 cm LV PW:         1.20 cm LV IVS:        1.10 cm LVOT diam:     2.00 cm LV SV:         75 ml LV SV Index:   26.75 LVOT Area:     3.14 cm  LEFT ATRIUM         Index LA diam:    5.90 cm 2.24 cm/m   AORTA Ao Root diam: 3.10 cm TRICUSPID VALVE TR Peak grad:   16.6 mmHg TR Vmax:        204.00 cm/s  SHUNTS Systemic Diam: 2.00 cm  Marca Ancona MD Electronically signed by Marca Ancona MD Signature Date/Time: 08/29/2019/5:36:58 PM    Final     Telemetry    Afib with CVR, no VT, artifact- Personally Reviewed  ECG    N/A  Cardiac Studies   Echo 08/29/2019:  1. Left ventricular ejection fraction, by visual estimation, is 55%. The left ventricle has normal function. There is no left ventricular hypertrophy. 2. Left  ventricular diastolic parameters are indeterminate. 3. The left ventricle has no regional wall motion abnormalities. 4. Global right ventricle has normal systolic function.The right ventricular size is mildly enlarged. No increase in right ventricular wall thickness. 5. Left atrial size was moderately dilated. 6. Right atrial size was mildly dilated. 7. Moderate mitral annular calcification. 8. The mitral valve is degenerative. Trace mitral valve regurgitation. No evidence of mitral stenosis. 9. The tricuspid valve is normal in structure. Tricuspid valve regurgitation is trivial. 10. The aortic valve is tricuspid. Aortic valve regurgitation is trivial. Mild to moderate aortic valve sclerosis/calcification without any evidence of aortic stenosis. 11. Peak RV-RA gradient 16 mmHg. 12. Technically difficult study with poor acoustic windows. RV not well-visualized. 13. The patient appeared to be in atrial fibrillation.  Patient Profile     68 y.o. male with a hx of paroxysmal afib, PE on anticoagulation, HTN, mild aortic and mitral stenosis, chronic diastolic HF, and morbid obesity who was admitted for acute diastolic heart failure.  Assessment & Plan    1. Acute on ChronicDiastolicHeart Failure: -Per echo 08/29/2019 likely in the setting of AF>>patient presented with progressive sob, fatigue, and abdominal distention. Recent diuretic changes which include PO Lasix to Torsemide>>BNP 413, CXR significant for edema. -Diuresing well on IV Lasix 40 mg IV BID - continue IV today and plan transition to po torsemide tomorrow. -Weight, 328lb today (admission weight 322lb)  -I&O, net negative 5.8L  -Creatinine, 1.50, up from 1.47 yesterday   -Continue Lopressor  2. Paroxysmal afib: -Rate controlled  -now on amiodarone -arranged DCCV tomorrow at 2pm with Dr. Marlou Porch  3. Sustained VT: -Noted to have a 41 beat run of monomorphic VT 12/08>> asymptomatic -PO Amiodarone added to regimen 400mg   PO BID - this is now quiescent  4. PE: -Anticoagulated with Xarelto 20 mg daily, no missed doses  5. HTN: -Low, 99/66>108/82>108/57 -Limiting titration of BB for rate control  -Lopressor 50 mg BID  6. Hyperkalemia: -Hold repletion today and monitor with BMET in AM  -K+, 5.2 today  For questions or updates, please contact   Please consult [email or web address] for contact info under Cardiology/STEMI.   Pixie Casino, MD, [facility], [place] Director of the Advanced Lipid Disorders &  Cardiovascular Risk Reduction Clinic Diplomate of the American Board of Clinical Lipidology Attending Cardiologist  Direct Dial: [phone]  Fax: [phone]  Website:  [email or web address]  08/31/2019, 7:33 AM

## 2019-08-31 NOTE — TOC Initial Note (Signed)
Transition of Care [facility]) - Initial/Assessment Note    Patient Details  Name: William Alvarado MRN: [id number] Date of Birth: 29-Jul-1951  Transition of Care [facility]) CM/SW Contact:    Zenon Mayo, RN Phone Number: 08/31/2019, 3:14 PM  Clinical Narrative:                 NCM offered choice , patient wife states he is active with [facility] for [facility] and would like to [facility] with [facility], they come to home to wrap unaboots, they would like CHF management as well.  NCM notified [place] with [employer]. She states they will follow, he is for cardioversion tomorrow. He has home oxygen with adapt 2 liters and wife will bring the oxygen tank to take him home at discharge.   Expected Discharge Plan: [place] Barriers to Discharge: No Barriers Identified   Patient Goals and CMS Choice Patient states their goals for this hospitalization and ongoing recovery are:: to get weight down to 280 CMS Medicare.gov Compare Post Acute Care list provided to:: Patient Represenative (must comment)(wife) Choice offered to / list presented to : Spouse, Patient  Expected Discharge Plan and Services Expected Discharge Plan: [place] In-house Referral: NA Discharge Planning Services: CM Consult Post Acute Care Choice: [place] arrangements for the past 2 months: Single Family Home                 DME Arranged: (NA)         HH Arranged: RN [place] Agency: [facility] Date [place] Agency Contacted: 08/31/19 Time HH Agency Contacted: 79 Representative spoke with at [place]: [place] Arrangements/Services Living arrangements for the past 2 months: [place] with:: Spouse Patient language and need for interpreter reviewed:: Yes Do you feel safe going back to the place where you live?: Yes      Need for Family Participation in Patient Care: Yes (Comment) Care giver support system in place?: Yes (comment) Current home  services: DME(has walker and cane) Criminal Activity/Legal Involvement Pertinent to Current Situation/Hospitalization: No - Comment as needed  Activities of Daily Living Home Assistive Devices/Equipment: Dentures (specify type), Walker (specify type), Cane (specify quad or straight) ADL Screening (condition at time of admission) Patient's cognitive ability adequate to safely complete daily activities?: Yes Is the patient deaf or have difficulty hearing?: No Does the patient have difficulty seeing, even when wearing glasses/contacts?: No Does the patient have difficulty concentrating, remembering, or making decisions?: No Patient able to express need for assistance with ADLs?: Yes Does the patient have difficulty dressing or bathing?: No Independently performs ADLs?: Yes (appropriate for developmental age) Does the patient have difficulty walking or climbing stairs?: Yes Weakness of Legs: Both Weakness of Arms/Hands: None  Permission Sought/Granted                  Emotional Assessment Appearance:: Appears stated age Attitude/Demeanor/Rapport: Engaged Affect (typically observed): Appropriate Orientation: : Oriented to Self, Oriented to Place, Oriented to  Time, Oriented to Situation Alcohol / Substance Use: Not Applicable Psych Involvement: No (comment)  Admission diagnosis:  Acute congestive heart failure, unspecified heart failure type [facility]) [I50.9] Patient Active Problem List   Diagnosis Date Noted  . Acute on chronic heart failure ([place]) 08/28/2019  . Acute on chronic diastolic (congestive) heart failure ([place])   . Chronic venous insufficiency 06/20/2019  . PVD (peripheral vascular disease) ([place]) 03/21/2019  . Venous stasis ulcer ([place]) 03/21/2019  .  Hypoxia 03/21/2019  . OSA (obstructive sleep apnea) 03/21/2019  . Gait instability 03/21/2019  . CHF (congestive heart failure) (HCC) 03/21/2019  . Shock (HCC)   . Hypovolemia   . Dehydration   . Hypotension 12/09/2017  .  Persistent atrial fibrillation (HCC)   . Chronic anticoagulation 03/26/2017  . History of pulmonary embolus (PE) 03/26/2017  . Sepsis (HCC) 03/10/2017  . Altered mental status   . PAF (paroxysmal atrial fibrillation) (HCC)   . Mitral regurgitation 12/26/2012  . Morbid obesity (HCC) 12/09/2012  . Hypertension   . Allergy   . Systolic murmur   . Colon polyps   . Diabetes mellitus without complication (HCC)   . Degenerative disc disease, lumbar    PCP:  Donita Brooks, MD Pharmacy:   [facility] [address], [place] - [address] [place] [address] Phone: [phone] Fax: [phone]  [id number] - [place], [place] - [address] [place] [address] Phone: [phone] Fax: [phone]     Social Determinants of Health (SDOH) Interventions    Readmission Risk Interventions No flowsheet data found.

## 2019-09-01 ENCOUNTER — Encounter (HOSPITAL_COMMUNITY)
Admission: EM | Disposition: A | Discharge status: Home Health | Payer: Self-pay | Source: Home / Self Care | Attending: Internal Medicine

## 2019-09-01 ENCOUNTER — Inpatient Hospital Stay (HOSPITAL_COMMUNITY): Payer: HMO | Admitting: Anesthesiology

## 2019-09-01 ENCOUNTER — Telehealth: Payer: Self-pay | Admitting: Physician Assistant

## 2019-09-01 DIAGNOSIS — I472 Ventricular tachycardia, unspecified: Secondary | ICD-10-CM

## 2019-09-01 DIAGNOSIS — I480 Paroxysmal atrial fibrillation: Secondary | ICD-10-CM

## 2019-09-01 HISTORY — PX: CARDIOVERSION: SHX1299

## 2019-09-01 LAB — BASIC METABOLIC PANEL
Anion gap: 11 (ref 5–15)
BUN: 30 mg/dL — ABNORMAL HIGH (ref 8–23)
CO2: 31 mmol/L (ref 22–32)
Calcium: 9 mg/dL (ref 8.9–10.3)
Chloride: 91 mmol/L — ABNORMAL LOW (ref 98–111)
Creatinine, Ser: 1.58 mg/dL — ABNORMAL HIGH (ref 0.61–1.24)
GFR calc Af Amer: 52 mL/min — ABNORMAL LOW (ref >60)
GFR calc non Af Amer: 45 mL/min — ABNORMAL LOW (ref >60)
Glucose, Bld: 130 mg/dL — ABNORMAL HIGH (ref 70–99)
Potassium: 5 mmol/L (ref 3.5–5.1)
Sodium: 133 mmol/L — ABNORMAL LOW (ref 135–145)

## 2019-09-01 LAB — GLUCOSE, CAPILLARY
Glucose-Capillary: 118 mg/dL — ABNORMAL HIGH (ref 70–99)
Glucose-Capillary: 122 mg/dL — ABNORMAL HIGH (ref 70–99)

## 2019-09-01 SURGERY — CARDIOVERSION
Anesthesia: General

## 2019-09-01 MED ORDER — LIDOCAINE HCL (CARDIAC) PF 100 MG/5ML IV SOSY
PREFILLED_SYRINGE | INTRAVENOUS | Status: DC | PRN
  Administered 2019-09-01: 100 mg via INTRATRACHEAL

## 2019-09-01 MED ORDER — AMIODARONE HCL 400 MG PO TABS: ORAL_TABLET | ORAL | 0 refills | Status: DC

## 2019-09-01 MED ORDER — PHENYLEPHRINE HCL (PRESSORS) 10 MG/ML IV SOLN
INTRAVENOUS | Status: DC | PRN
  Administered 2019-09-01 (×2): 40 ug via INTRAVENOUS

## 2019-09-01 MED ORDER — AMIODARONE HCL 200 MG PO TABS: 200.0000 mg | ORAL_TABLET | Freq: Every day | ORAL | 5 refills | Status: AC

## 2019-09-01 MED ORDER — SODIUM CHLORIDE 0.9 % IV SOLN
INTRAVENOUS | Status: AC | PRN
  Administered 2019-09-01: 500 mL via INTRAMUSCULAR

## 2019-09-01 MED ORDER — POTASSIUM CHLORIDE CRYS ER 20 MEQ PO TBCR: 20.0000 meq | EXTENDED_RELEASE_TABLET | Freq: Two times a day (BID) | ORAL | 3 refills | Status: DC

## 2019-09-01 MED ORDER — PROPOFOL 10 MG/ML IV BOLUS
INTRAVENOUS | Status: DC | PRN
  Administered 2019-09-01: 60 mg via INTRAVENOUS

## 2019-09-01 NOTE — Anesthesia Preprocedure Evaluation (Addendum)
Anesthesia Evaluation  Patient identified by MRN, date of birth, ID band Patient awake    Reviewed: Allergy & Precautions, NPO status , Patient's Chart, lab work & pertinent test results, reviewed documented beta blocker date and time   History of Anesthesia Complications Negative for: history of anesthetic complications  Airway Mallampati: III  TM Distance: >3 FB Neck ROM: Full    Dental   Pulmonary sleep apnea , COPD, former smoker, PE   Pulmonary exam normal        Cardiovascular hypertension, Pt. on home beta blockers and Pt. on medications + Peripheral Vascular Disease and +CHF (pEF)  + dysrhythmias Atrial Fibrillation + Valvular Problems/Murmurs AS and MR  Rhythm:Irregular Rate:Normal     Neuro/Psych negative neurological ROS  negative psych ROS   GI/Hepatic negative GI ROS, Neg liver ROS,   Endo/Other  diabetes, Type 2Morbid obesity  Renal/GU Renal Insufficiency and ARFRenal disease  negative genitourinary   Musculoskeletal negative musculoskeletal ROS (+)   Abdominal (+) + obese,   Peds  Hematology negative hematology ROS (+)   Anesthesia Other Findings Echo 08/29/19: EF 55%, no aortic or mitral stenosis, valves unremarkable  Reproductive/Obstetrics                            Anesthesia Physical Anesthesia Plan  ASA: III  Anesthesia Plan: General   Post-op Pain Management:    Induction: Intravenous  PONV Risk Score and Plan: 2 and TIVA and Treatment may vary due to age or medical condition  Airway Management Planned: Mask  Additional Equipment: None  Intra-op Plan:   Post-operative Plan:   Informed Consent: I have reviewed the patients History and Physical, chart, labs and discussed the procedure including the risks, benefits and alternatives for the proposed anesthesia with the patient or authorized representative who has indicated his/her understanding and acceptance.        Plan Discussed with:   Anesthesia Plan Comments:        Anesthesia Quick Evaluation

## 2019-09-01 NOTE — Discharge Summary (Signed)
Discharge Summary    Patient ID: William Alvarado MRN: [id number]; DOB: Aug 10, 1951  Admit date: 08/28/2019 Discharge date: 09/01/2019  Primary Care Provider: Donita Brooks, MD  Primary Cardiologist: Olga Millers, MD  Primary Electrophysiologist:  None   Discharge Diagnoses    Principal Problem:   Acute on chronic heart failure [facility]) Active Problems:   Hypertension   Chronic anticoagulation   History of pulmonary embolus (PE)   Persistent atrial fibrillation (HCC)   V-tach [facility])   Diagnostic Studies/Procedures    Successful electrical cardioversion of atrial fibrillation 09/01/2019  Echo 08/29/2019: 1. Left ventricular ejection fraction, by visual estimation, is 55%. The left ventricle has normal function. There is no left ventricular hypertrophy. 2. Left ventricular diastolic parameters are indeterminate. 3. The left ventricle has no regional wall motion abnormalities. 4. Global right ventricle has normal systolic function.The right ventricular size is mildly enlarged. No increase in right ventricular wall thickness. 5. Left atrial size was moderately dilated. 6. Right atrial size was mildly dilated. 7. Moderate mitral annular calcification. 8. The mitral valve is degenerative. Trace mitral valve regurgitation. No evidence of mitral stenosis. 9. The tricuspid valve is normal in structure. Tricuspid valve regurgitation is trivial. 10. The aortic valve is tricuspid. Aortic valve regurgitation is trivial. Mild to moderate aortic valve sclerosis/calcification without any evidence of aortic stenosis. 11. Peak RV-RA gradient 16 mmHg. 12. Technically difficult study with poor acoustic windows. RV not well-visualized. 13. The patient appeared to be in atrial fibrillation. _____________   History of Present Illness     William Alvarado is a 68 y.o. male patient of Dr. Jens Som with history of PAF, PE on Xarelto, HTN, mild aortic and mitral stenosis (last echo in  04/2018 - nl LVEF), morbid obesity, OSA (not tolerant of CPAP - on 2L [place] oxygen) and chronic diastolic CHF. He presented on 08/28/2019 after being seen 6 days prior by Dr. Jens Som for acute on chronic diastolic CHF. He was switched from lasix to torsemide and was noted to be back in afib. He did not diurese well and weight continued to climb - he was directed to the ER for IV diuretics. In the ER he denied chest pain, but is was orthopneic - he has had worsening abdominal swelling and LE edema and has been receiving UNNA boots at home. On exam he was noted to have mild respiratory distress, JVP elevated several centimeters, lungs with decreased breath sounds at both bases, irregularly irregular rhythm, protuberant and firm abdomen, 2+ LE edema with venous stasis changes, purple toes, UNNA boots in place, follows commands, A&OX3, pleasant.  He was admitted to the hospital with acute on chronic congestive heart failure without adequate response to oral diuretics. He was also in afib with CVR which is a recent finding for him.   Hospital Course     Consultants: none   BNP was 413, chest x-ray significant for edema.  Echocardiogram done on 08/29/2019 showed normal LV systolic function with EF 55%, no LVH, no regional wall motion abnormalities.   The patient was diuresed with Lasix 40 mg IV twice daily.  He is net -6.6 L.  Dry weight~149 kg, not much change in weight while hospitalized.  Serum creatinine was slightly up trending, appears that diuresis is at its endpoint.  He will be discharged home on his usual dose of torsemide 40 mg twice daily.  The patient continued in atrial fibrillation with controlled rate. Blood pressure was soft limiting the titration of beta-blocker for rate  control.  Continues on Lopressor 50 mg twice daily.  He was noted to have an episode of sustained ventricular tachycardia for 41 beats on 08/29/2019, asymptomatic.  He was started on amiodarone.  He is currently on 400 mg twice  daily and will continue for the next 7 days then taper to 400 mg daily for 1 week then 200 mg daily.    The patient underwent successful electrical cardioversion to sinus rhythm today.  He continues on anticoagulation with Xarelto 20 mg daily.  He will need uninterrupted anticoagulation for 4 weeks.  He also has a history of PE for which he was previously on anticoagulation.  The patient's lower legs are wrapped with Unna boots and was previously being followed for this by home health.  His leg wraps are currently saturated and they are to be changed prior to discharge.  Patient has been seen by Dr. Debara Pickett today and deemed ready for discharge home. All follow up appointments have been scheduled. Discharge medications are listed below.   Did the patient have an acute coronary syndrome (MI, NSTEMI, STEMI, etc) this admission?:  No                               Did the patient have a percutaneous coronary intervention (stent / angioplasty)?:  No.   _____________  Discharge Vitals Blood pressure 105/82, pulse 67, temperature (!) 97.5 F (36.4 C), temperature source Oral, resp. rate 20, height 6\' 1"  (1.854 m), weight (!) 149 kg, SpO2 94 %.  Filed Weights   08/31/19 0256 09/01/19 0231 09/01/19 0932  Weight: (!) 149.1 kg (!) 149 kg (!) 149 kg    Labs & Radiologic Studies    CBC Recent Labs    08/30/19 0337  WBC 9.7  HGB 12.1*  HCT 41.7  MCV 87.6  PLT 242   Basic Metabolic Panel Recent Labs    08/30/19 0337 08/31/19 0600 09/01/19 0351  NA 135 134* 133*  K 4.9 5.2* 5.0  CL 90* 93* 91*  CO2 35* 33* 31  GLUCOSE 156* 139* 130*  BUN 26* 26* 30*  CREATININE 1.47* 1.50* 1.58*  CALCIUM 9.0 8.8* 9.0  MG 2.3  --   --    Liver Function Tests No results for input(s): AST, ALT, ALKPHOS, BILITOT, PROT, ALBUMIN in the last 72 hours. No results for input(s): LIPASE, AMYLASE in the last 72 hours. High Sensitivity Troponin:   No results for input(s): TROPONINIHS in the last 720 hours.   BNP Invalid input(s): POCBNP D-Dimer No results for input(s): DDIMER in the last 72 hours. Hemoglobin A1C No results for input(s): HGBA1C in the last 72 hours. Fasting Lipid Panel No results for input(s): CHOL, HDL, LDLCALC, TRIG, CHOLHDL, LDLDIRECT in the last 72 hours. Thyroid Function Tests No results for input(s): TSH, T4TOTAL, T3FREE, THYROIDAB in the last 72 hours.  Invalid input(s): FREET3 _____________  DG Chest 2 View  Result Date: 08/28/2019 CLINICAL DATA:  Shortness of breath, recent change in diuretic EXAM: CHEST - 2 VIEW COMPARISON:  05/07/2019 FINDINGS: Cardiomegaly. Unchanged elevation of the left hemidiaphragm. Mild diffuse interstitial pulmonary opacity. The visualized skeletal structures are unremarkable. IMPRESSION: Cardiomegaly with mild, diffuse interstitial pulmonary opacity, generally in keeping with edema. No focal airspace opacity. Electronically Signed   By: Eddie Candle M.D.   On: 08/28/2019 13:13   ECHOCARDIOGRAM COMPLETE  Result Date: 08/29/2019   ECHOCARDIOGRAM REPORT   Patient Name:   William Alvarado Date of Exam: 08/29/2019 Medical Rec #:  [id number]         Height:       73.0 in Accession #:    [id number]        Weight:       322.0 lb Date of Birth:  [birth year]         BSA:          2.63 m Patient Age:    68 years          BP:           92/77 mmHg Patient Gender: M                 HR:           131 bpm. Exam Location:  Inpatient Procedure: 2D Echo Indications:    CHF-Acute Diastolic 428.31 / I50.31  History:        Patient has prior history of Echocardiogram examinations, most                 recent 05/02/2018. CHF, Arrythmias:Atrial Fibrillation,                 Signs/Symptoms:Hypotension; Risk Factors:Hypertension and                 Diabetes. Sepsis                 History of pulmonary embolus                 Morbid obesity                 Systolic murmur.  Sonographer:    Leeroy Bockhelsea Turrentine Referring Phys: [id number] CADENCE H FURTH  Sonographer Comments: Patient  is morbidly obese. Image acquisition challenging due to patient body habitus. Technically difficult study due to patient wanting to remain sitting upright on edge of bed. IMPRESSIONS  1. Left ventricular ejection fraction, by visual estimation, is 55%. The left ventricle has normal function. There is no left ventricular hypertrophy.  2. Left ventricular diastolic parameters are indeterminate.  3. The left ventricle has no regional wall motion abnormalities.  4. Global right ventricle has normal systolic function.The right ventricular size is mildly enlarged. No increase in right ventricular wall thickness.  5. Left atrial size was moderately dilated.  6. Right atrial size was mildly dilated.  7. Moderate mitral annular calcification.  8. The mitral valve is degenerative. Trace mitral valve regurgitation. No evidence of mitral stenosis.  9. The tricuspid valve is normal in structure. Tricuspid valve regurgitation is trivial. 10. The aortic valve is tricuspid. Aortic valve regurgitation is trivial. Mild to moderate aortic valve sclerosis/calcification without any evidence of aortic stenosis. 11. Peak RV-RA gradient 16 mmHg. 12. Technically difficult study with poor acoustic windows. RV not well-visualized. 13. The patient appeared to be in atrial fibrillation. FINDINGS  Left Ventricle: Left ventricular ejection fraction, by visual estimation, is 55%. The left ventricle has normal function. The left ventricle has no regional wall motion abnormalities. The left ventricular internal cavity size was the left ventricle is normal in size. There is no left ventricular hypertrophy. Left ventricular diastolic parameters are indeterminate. Right Ventricle: The right ventricular size is mildly enlarged. No increase in right ventricular wall thickness. Global RV systolic function is has normal systolic function. Left Atrium: Left atrial size was moderately dilated. Right Atrium: Right atrial size was mildly dilated Pericardium:  There is no evidence of pericardial effusion. Mitral Valve:  The mitral valve is degenerative in appearance. There is mild calcification of the mitral valve leaflet(s). Moderate mitral annular calcification. No evidence of mitral valve stenosis by observation. Trace mitral valve regurgitation. Tricuspid Valve: The tricuspid valve is normal in structure. Tricuspid valve regurgitation is trivial. Aortic Valve: The aortic valve is tricuspid. Aortic valve regurgitation is trivial. Mild to moderate aortic valve sclerosis/calcification is present, without any evidence of aortic stenosis. Pulmonic Valve: The pulmonic valve was normal in structure. Pulmonic valve regurgitation is not visualized. Aorta: The aortic root is normal in size and structure. Venous: The inferior vena cava was not well visualized. IAS/Shunts: No atrial level shunt detected by color flow Doppler.  LEFT VENTRICLE PLAX 2D LVIDd:         5.20 cm LVIDs:         3.60 cm LV PW:         1.20 cm LV IVS:        1.10 cm LVOT diam:     2.00 cm LV SV:         75 ml LV SV Index:   26.75 LVOT Area:     3.14 cm  LEFT ATRIUM         Index LA diam:    5.90 cm 2.24 cm/m   AORTA Ao Root diam: 3.10 cm TRICUSPID VALVE TR Peak grad:   16.6 mmHg TR Vmax:        204.00 cm/s  SHUNTS Systemic Diam: 2.00 cm  Marca Ancona MD Electronically signed by Marca Ancona MD Signature Date/Time: 08/29/2019/5:36:58 PM    Final    Disposition   Pt is being discharged home today in good condition.  Follow-up Plans & Appointments    Follow-up Information    [facility] Follow up.   Specialty: Home Health Services Why: [facility] Contact information: [address] [place] [address] [phone]        [facility], [place] Follow up.   Specialties: Cardiology, Radiology Why: Cardiology hospital follow-up on 09/12/2019 at 10:00 AM.  Please arrive 15 minutes early for check-in. Contact information: [address] [place]  [address] [phone]          Discharge Instructions    (HEART FAILURE PATIENTS) Call MD:  Anytime you have any of the following symptoms: 1) 3 pound weight gain in 24 hours or 5 pounds in 1 week 2) shortness of breath, with or without a dry hacking cough 3) swelling in the hands, feet or stomach 4) if you have to sleep on extra pillows at night in order to breathe.   Complete by: As directed    Diet - low sodium heart healthy   Complete by: As directed    Increase activity slowly   Complete by: As directed       Discharge Medications   Allergies as of 09/01/2019   No Known Allergies     Medication List    STOP taking these medications   cephALEXin 500 MG capsule Commonly known as: KEFLEX     TAKE these medications   acetaminophen 500 MG tablet Commonly known as: TYLENOL Take 1,000 mg by mouth 2 (two) times daily as needed (pain).   amiodarone 400 MG tablet Commonly known as: PACERONE Take 1 tablet (400 mg total) by mouth 2 (two) times daily for 7 days, THEN 1 tablet (400 mg total) daily for 7 days. Then 200 mg daily. Start taking on: September 01, 2019  amiodarone 200 MG tablet Commonly known as: Pacerone Take 1 tablet (200 mg total) by mouth daily. Start after finished the 400 mg tabs, in 2 weeks. Start taking on: September 16, 2019   Dulaglutide 1.5 MG/0.5ML Sopn Commonly known as: Editor, commissioningTrulicity INJECT 1.5 MG INTO THE SKIN ONCE A WEEK What changed:   how much to take  how to take this  when to take this  additional instructions   metoprolol tartrate 50 MG tablet Commonly known as: LOPRESSOR Take 1 tablet by mouth twice daily   OXYGEN Inhale 2 L into the lungs continuous.   potassium chloride SA 20 MEQ tablet Commonly known as: KLOR-CON Take 1 tablet (20 mEq total) by mouth 2 (two) times daily. What changed: how much to take   pravastatin 20 MG tablet Commonly known as: PRAVACHOL Take 1 tablet (20 mg total) by mouth daily.   rivaroxaban 20 MG Tabs  tablet Commonly known as: Xarelto TAKE 1 TABLET BY MOUTH ONCE DAILY WITH SUPPER What changed:   how much to take  how to take this  when to take this  additional instructions   sitaGLIPtin 100 MG tablet Commonly known as: Januvia Take 1 tablet (100 mg total) by mouth daily. What changed: when to take this   torsemide 20 MG tablet Commonly known as: DEMADEX Take 2 tablets (40 mg total) by mouth 2 (two) times daily.          Outstanding Labs/Studies   Basic metabolic panel at follow-up.  Duration of Discharge Encounter   Greater than 30 minutes including physician time.  Signed, Berton BonJanine Loghan Kurtzman, NP 09/01/2019, 12:49 PM

## 2019-09-01 NOTE — Progress Notes (Signed)
Progress Note  Patient Name: William Alvarado Date of Encounter: 09/01/2019  Primary Cardiologist: Kirk Ruths, MD   Subjective   Successful DCCV today- now in sinus. Net negative close to 1L overnight - total -6.6L - weight is now 149 kg, but has been stable.  Creatinine up to 1.58 today.  Inpatient Medications    Scheduled Meds: . amiodarone  400 mg Oral BID  . bacitracin   Topical BID  . furosemide  40 mg Intravenous BID  . insulin aspart  0-20 Units Subcutaneous TID WC  . metoprolol tartrate  50 mg Oral BID  . pravastatin  20 mg Oral Daily  . rivaroxaban  20 mg Oral Q supper  . sodium chloride flush  3 mL Intravenous Q12H   Continuous Infusions: . sodium chloride Stopped (09/01/19 1014)   PRN Meds: sodium chloride, acetaminophen, ALPRAZolam, ondansetron (ZOFRAN) IV, sodium chloride flush, zolpidem   Vital Signs    Vitals:   09/01/19 1014 09/01/19 1027 09/01/19 1036 09/01/19 1055  BP: 93/64 95/73 112/77 105/82  Pulse: 65 63 65 67  Resp: 20 13 20 20   Temp: 98.1 F (36.7 C)   (!) 97.5 F (36.4 C)  TempSrc: Axillary   Oral  SpO2: 95% 100% 94% 94%  Weight:      Height:        Intake/Output Summary (Last 24 hours) at 09/01/2019 1129 Last data filed at 09/01/2019 1014 Gross per 24 hour  Intake 1523 ml  Output 1800 ml  Net -277 ml   Filed Weights   08/31/19 0256 09/01/19 0231 09/01/19 0932  Weight: (!) 149.1 kg (!) 149 kg (!) 149 kg    Physical Exam   General appearance: alert, no distress and morbidly obese Neck: no carotid bruit, no JVD and thyroid not enlarged, symmetric, no tenderness/mass/nodules Lungs: diminished breath sounds bibasilar Heart: regular rate and rhythm Abdomen: soft, non-tender; bowel sounds normal; no masses,  no organomegaly Extremities: edema 1+ stasis edema, wrapped Pulses: 2+ and symmetric Skin: Skin color, texture, turgor normal. No rashes or lesions Neurologic: Grossly normal Psych: Pleasant  Labs     Chemistry Recent Labs  Lab 08/28/19 2033 08/30/19 0337 08/31/19 0600 09/01/19 0351  NA  --  135 134* 133*  K  --  4.9 5.2* 5.0  CL  --  90* 93* 91*  CO2  --  35* 33* 31  GLUCOSE  --  156* 139* 130*  BUN  --  26* 26* 30*  CREATININE  --  1.47* 1.50* 1.58*  CALCIUM  --  9.0 8.8* 9.0  PROT 7.7  --   --   --   ALBUMIN 3.1*  --   --   --   AST 16  --   --   --   ALT 11  --   --   --   ALKPHOS 77  --   --   --   BILITOT 1.1  --   --   --   GFRNONAA  --  49* 48* 45*  GFRAA  --  57* 55* 52*  ANIONGAP  --  10 8 11      Hematology Recent Labs  Lab 08/28/19 1257 08/29/19 0505 08/30/19 0337  WBC 9.6 8.9 9.7  RBC 4.88 4.89 4.76  HGB 12.5* 12.6* 12.1*  HCT 42.9 42.6 41.7  MCV 87.9 87.1 87.6  MCH 25.6* 25.8* 25.4*  MCHC 29.1* 29.6* 29.0*  RDW 19.8* 19.6* 19.4*  PLT 333 324 347  Cardiac EnzymesNo results for input(s): TROPONINI in the last 168 hours. No results for input(s): TROPIPOC in the last 168 hours.   BNP Recent Labs  Lab 08/28/19 1257  BNP 413.9*     DDimer No results for input(s): DDIMER in the last 168 hours.   Radiology    No results found.  Telemetry    Afib with CVR, no VT, artifact- Personally Reviewed  ECG    N/A  Cardiac Studies   Echo 08/29/2019:  1. Left ventricular ejection fraction, by visual estimation, is 55%. The left ventricle has normal function. There is no left ventricular hypertrophy. 2. Left ventricular diastolic parameters are indeterminate. 3. The left ventricle has no regional wall motion abnormalities. 4. Global right ventricle has normal systolic function.The right ventricular size is mildly enlarged. No increase in right ventricular wall thickness. 5. Left atrial size was moderately dilated. 6. Right atrial size was mildly dilated. 7. Moderate mitral annular calcification. 8. The mitral valve is degenerative. Trace mitral valve regurgitation. No evidence of mitral stenosis. 9. The tricuspid valve is normal  in structure. Tricuspid valve regurgitation is trivial. 10. The aortic valve is tricuspid. Aortic valve regurgitation is trivial. Mild to moderate aortic valve sclerosis/calcification without any evidence of aortic stenosis. 11. Peak RV-RA gradient 16 mmHg. 12. Technically difficult study with poor acoustic windows. RV not well-visualized. 13. The patient appeared to be in atrial fibrillation.  Patient Profile     68 y.o. male with a hx of paroxysmal afib, PE on anticoagulation, HTN, mild aortic and mitral stenosis, chronic diastolic HF, and morbid obesity who was admitted for acute diastolic heart failure.   Assessment & Plan    1. Acute on ChronicDiastolicHeart Failure: -Per echo 08/29/2019 likely in the setting of AF>>patient presented with progressive sob, fatigue, and abdominal distention. Recent diuretic changes which include PO Lasix to Torsemide>>BNP 413, CXR significant for edema. -I&O, net negative 6.6L, dry weight 149kg -Creatinine slightly uptrending, appears at diuresis endpoint today  2. Paroxysmal afib: -now on amiodarone -back in sinus with DCCV today.  3. Sustained VT: -Noted to have a 41 beat run of monomorphic VT 12/08>> asymptomatic -Amiodarone added to regimen 400mg  PO BID - will continue for 7 days, then taper to 400 mg daily for 1 week, then to 200 mg daily.  4. PE: -Anticoagulated with Xarelto 20 mg daily  5. HTN: -Low, 99/66>108/82>108/57 -Limiting titration of BB for rate control  -Lopressor 50 mg BID  6. Hyperkalemia: - Potassium 5.0 today- continue to hold repletion at discharge  Ok to d/c home today- will need TOC follow-up in 7 days with APP or Dr. at [facility].  For questions or updates, please contact   Please consult [email or web address] for contact info under Cardiology/STEMI.   [facility], MD, [facility], FACP  [place]  [facility]  Medical Director of the Advanced Lipid Disorders &  Cardiovascular Risk Reduction  Clinic Diplomate of the American Board of Clinical Lipidology Attending Cardiologist  Direct Dial: [phone]  Fax: [phone]  Website:  [email or web address]  09/01/2019, 11:29 AM

## 2019-09-01 NOTE — Telephone Encounter (Signed)
Still admitted 08/28/2019 - present (4 days)  William Alvarado

## 2019-09-01 NOTE — Interval H&P Note (Signed)
History and Physical Interval Note:  09/01/2019 10:02 AM  William Alvarado  has presented today for surgery, with the diagnosis of afib.  The various methods of treatment have been discussed with the patient and family. After consideration of risks, benefits and other options for treatment, the patient has consented to  Procedure(s): CARDIOVERSION (N/A) as a surgical intervention.  The patient's history has been reviewed, patient examined, no change in status, stable for surgery.  I have reviewed the patient's chart and labs.  Questions were answered to the patient's satisfaction.     [employer]

## 2019-09-01 NOTE — TOC Transition Note (Signed)
Transition of Care [facility]) - CM/SW Discharge Note   Patient Details  Name: William Alvarado MRN: [id number] Date of Birth: [birth year]  [facility]) CM/SW Contact:  Zenon Mayo, RN Phone Number: 09/01/2019, 2:23 PM   Clinical Narrative:    Patient for dc today, he is active with [facility] with [facility]. NCM notified Phylliss Bob NP to put order in for [facility] so he can resume [facility] services.   Final next level of care: [place] Barriers to Discharge: No Barriers Identified   Patient Goals and CMS Choice Patient states their goals for this hospitalization and ongoing recovery are:: to get weight down to 280 CMS Medicare.gov Compare Post Acute Care list provided to:: Patient Represenative (must comment)(wife) Choice offered to / list presented to : Spouse, Patient  Discharge Placement                       Discharge Plan and Services In-house Referral: NA Discharge Planning Services: CM Consult Post Acute Care Choice: Home Health          DME Arranged: (NA)         HH Arranged: RN [place] Agency: [facility] Date [place]: 08/31/19 Time [place]: [id number] Representative spoke with at [place]: [place] ([place]) Interventions     Readmission Risk Interventions No flowsheet data found.

## 2019-09-01 NOTE — Transfer of Care (Signed)
Immediate Anesthesia Transfer of Care Note  Patient: William Alvarado  Procedure(s) Performed: CARDIOVERSION (N/A )  Patient Location: Endoscopy Unit  Anesthesia Type:MAC  Level of Consciousness: awake, alert  and sedated  Airway & Oxygen Therapy: Patient connected to nasal cannula oxygen  Post-op Assessment: Post -op Vital signs reviewed and stable  Post vital signs: stable  Last Vitals:  Vitals Value Taken Time  BP    Temp    Pulse    Resp    SpO2      Last Pain:  Vitals:   09/01/19 1014  TempSrc: (P) Axillary  PainSc:          Complications: Patient re-intubated

## 2019-09-01 NOTE — CV Procedure (Signed)
    Electrical Cardioversion Procedure Note William Alvarado [id number] 1951/06/12  Procedure: Electrical Cardioversion Indications:  Atrial Fibrillation  Time Out: Verified patient identification, verified procedure,medications/allergies/relevent history reviewed, required imaging and test results available.  Performed  Procedure Details  The patient was NPO after midnight. Anesthesia was administered at the beside  by Dr.Witman with 60mg  of propofol.  Cardioversion was performed with synchronized biphasic defibrillation via AP pads with 120 joules.  1 attempt(s) were performed.  The patient converted to normal sinus rhythm. The patient tolerated the procedure well   IMPRESSION:  Successful cardioversion of atrial fibrillation    William Alvarado 09/01/2019, 10:11 AM

## 2019-09-01 NOTE — Progress Notes (Signed)
Nutrition Education Note  RD consulted for nutrition education regarding CHF.  Attempted to speak with pt, however, receiving nursing care at time of visit.   RD provided "Low Sodium Nutrition Therapy" handout from the Academy of Nutrition and Dietetics. RD attached handout to AVS/ discharge summary.   Body mass index is 43.34 kg/m. Pt meets criteria for extreme obesity, class III based on current BMI.  Current diet order is regular, patient is consuming approximately 100% of meals at this time. Labs and medications reviewed. No further nutrition interventions warranted at this time. RD contact information provided. If additional nutrition issues arise, please re-consult RD.   William Alvarado A. Jimmye Norman, RD, LDN, [place] Registered Dietitian II Certified Diabetes Care and Education Specialist Pager: [phone] After hours Pager: [phone]

## 2019-09-01 NOTE — Anesthesia Postprocedure Evaluation (Signed)
Anesthesia Post Note  Patient: William Alvarado  Procedure(s) Performed: CARDIOVERSION (N/A )     Patient location during evaluation: Endoscopy Anesthesia Type: General Level of consciousness: awake and alert Pain management: pain level controlled Vital Signs Assessment: post-procedure vital signs reviewed and stable Respiratory status: spontaneous breathing, nonlabored ventilation and respiratory function stable Cardiovascular status: blood pressure returned to baseline and stable Postop Assessment: no apparent nausea or vomiting Anesthetic complications: no    Last Vitals:  Vitals:   09/01/19 1036 09/01/19 1055  BP: 112/77 105/82  Pulse: 65 67  Resp: 20 20  Temp:  (!) 36.4 C  SpO2: 94% 94%    Last Pain:  Vitals:   09/01/19 1055  TempSrc: Oral  PainSc:                  Lidia Collum

## 2019-09-01 NOTE — Telephone Encounter (Signed)
   [facility] appointment William Alvarado 12/22

## 2019-09-01 NOTE — Care Management Important Message (Signed)
Important Message  Patient Details  Name: SUREN PAYNE MRN: [id number] Date of Birth: 10/31/1950   Medicare Important Message Given:  Yes     Kavita Bartl 09/01/2019, 2:00 PM

## 2019-09-01 NOTE — Progress Notes (Signed)
Orthopedic Tech Progress Note Patient Details:  William Alvarado Nov 13, 1950 [id number]  Ortho Devices Type of Ortho Device: Haematologist Ortho Device/Splint Location: bilateral Ortho Device/Splint Interventions: Application, Ordered   Post Interventions Patient Tolerated: Well Instructions Provided: Care of device, Adjustment of device   [name] 09/01/2019, 1:12 PM

## 2019-09-03 ENCOUNTER — Telehealth: Payer: Self-pay | Admitting: Cardiology

## 2019-09-03 ENCOUNTER — Other Ambulatory Visit: Payer: Self-pay | Admitting: Cardiology

## 2019-09-03 DIAGNOSIS — I5032 Chronic diastolic (congestive) heart failure: Secondary | ICD-10-CM

## 2019-09-03 MED ORDER — POTASSIUM CHLORIDE CRYS ER 20 MEQ PO TBCR: 20.0000 meq | EXTENDED_RELEASE_TABLET | Freq: Every day | ORAL | 3 refills | Status: DC

## 2019-09-03 NOTE — Telephone Encounter (Signed)
Pts wife [name] called answering service with concerns about pt wt. I called her back. We discussed discharge instructions from yesterday. DC wt was 328. She will watch his weight. They are limiting fluid intake to 6 cups per day and limiting salt intake.  She is concerned that the torsemide was not working prior to hospitalization and feels that the furosemide has worked better. We discussed the better gastric absorption of torsemide and now that he has been diuresed in the hospital, the torsemide may work better. They will continue on the torsemide and monitor his status. She will call during the week if his wt goes up or he does not have increase in urine output with torsemide. She is also concerned about the potassium as his level was high in the hospital. I had reduced his home dose from 40 mEq BID to 75mEq BID, however with review of his levels, will reduce to 5mEq daily. He will have labs done at follow up.  Pt has appt with Almyra Deforest on 12/22. I will send this to him and to Dr. Stanford Breed.

## 2019-09-04 ENCOUNTER — Other Ambulatory Visit: Payer: Self-pay

## 2019-09-04 DIAGNOSIS — I110 Hypertensive heart disease with heart failure: Secondary | ICD-10-CM | POA: Diagnosis not present

## 2019-09-04 DIAGNOSIS — I83028 Varicose veins of left lower extremity with ulcer other part of lower leg: Secondary | ICD-10-CM | POA: Diagnosis not present

## 2019-09-04 DIAGNOSIS — Z7901 Long term (current) use of anticoagulants: Secondary | ICD-10-CM | POA: Diagnosis not present

## 2019-09-04 DIAGNOSIS — G4733 Obstructive sleep apnea (adult) (pediatric): Secondary | ICD-10-CM | POA: Diagnosis not present

## 2019-09-04 DIAGNOSIS — Z86711 Personal history of pulmonary embolism: Secondary | ICD-10-CM | POA: Diagnosis not present

## 2019-09-04 DIAGNOSIS — L97821 Non-pressure chronic ulcer of other part of left lower leg limited to breakdown of skin: Secondary | ICD-10-CM | POA: Diagnosis not present

## 2019-09-04 DIAGNOSIS — G8929 Other chronic pain: Secondary | ICD-10-CM | POA: Diagnosis not present

## 2019-09-04 DIAGNOSIS — I5042 Chronic combined systolic (congestive) and diastolic (congestive) heart failure: Secondary | ICD-10-CM | POA: Diagnosis not present

## 2019-09-04 DIAGNOSIS — Z9981 Dependence on supplemental oxygen: Secondary | ICD-10-CM | POA: Diagnosis not present

## 2019-09-04 DIAGNOSIS — M5136 Other intervertebral disc degeneration, lumbar region: Secondary | ICD-10-CM | POA: Diagnosis not present

## 2019-09-04 DIAGNOSIS — I83018 Varicose veins of right lower extremity with ulcer other part of lower leg: Secondary | ICD-10-CM | POA: Diagnosis not present

## 2019-09-04 DIAGNOSIS — Z87891 Personal history of nicotine dependence: Secondary | ICD-10-CM | POA: Diagnosis not present

## 2019-09-04 DIAGNOSIS — Z8701 Personal history of pneumonia (recurrent): Secondary | ICD-10-CM | POA: Diagnosis not present

## 2019-09-04 DIAGNOSIS — Z9181 History of falling: Secondary | ICD-10-CM | POA: Diagnosis not present

## 2019-09-04 DIAGNOSIS — I340 Nonrheumatic mitral (valve) insufficiency: Secondary | ICD-10-CM | POA: Diagnosis not present

## 2019-09-04 DIAGNOSIS — I480 Paroxysmal atrial fibrillation: Secondary | ICD-10-CM | POA: Diagnosis not present

## 2019-09-04 DIAGNOSIS — K635 Polyp of colon: Secondary | ICD-10-CM | POA: Diagnosis not present

## 2019-09-04 DIAGNOSIS — Z6841 Body Mass Index (BMI) 40.0 and over, adult: Secondary | ICD-10-CM | POA: Diagnosis not present

## 2019-09-04 DIAGNOSIS — J449 Chronic obstructive pulmonary disease, unspecified: Secondary | ICD-10-CM | POA: Diagnosis not present

## 2019-09-04 DIAGNOSIS — E1140 Type 2 diabetes mellitus with diabetic neuropathy, unspecified: Secondary | ICD-10-CM | POA: Diagnosis not present

## 2019-09-04 DIAGNOSIS — E1151 Type 2 diabetes mellitus with diabetic peripheral angiopathy without gangrene: Secondary | ICD-10-CM | POA: Diagnosis not present

## 2019-09-04 DIAGNOSIS — Z7984 Long term (current) use of oral hypoglycemic drugs: Secondary | ICD-10-CM | POA: Diagnosis not present

## 2019-09-04 DIAGNOSIS — L97811 Non-pressure chronic ulcer of other part of right lower leg limited to breakdown of skin: Secondary | ICD-10-CM | POA: Diagnosis not present

## 2019-09-04 NOTE — Telephone Encounter (Signed)
Repeat BMET tomorrow; FU with Isaac Laud as scheduled Kirk Ruths

## 2019-09-04 NOTE — Telephone Encounter (Signed)
Patient contacted regarding discharge from [place] on 09/01/19.  Patient understands to follow up with provider HAO MENG PA on 09/12/19 at 10:00 AM at [facility]. Patient understands discharge instructions? YES Patient understands medications and regiment? YES Patient understands to bring all medications to this visit? YES    PER PT'S WIFE PT HAS GAINED 2 LB SINCE DISCHARGE AND HAS DRY COUGH WILL CONTINUE TO MONITOR IF HAS WEIGHT GAIN OF 2 LB IN 24 HOURS OR 5 LB IN WEEK WILL CALL BACK

## 2019-09-04 NOTE — Telephone Encounter (Signed)
Spoke with pt wife, they will come to the office tomorrow for lab work.

## 2019-09-04 NOTE — Addendum Note (Signed)
Addended by: Cristopher Estimable on: 09/04/2019 11:25 AM   Modules accepted: Orders

## 2019-09-04 NOTE — Patient Outreach (Signed)
  [place] [facility]) Care Management Chronic Special Needs Program  09/04/2019  Name: DHAVAL WOO DOB: [birth year]  MRN: [id number]  Mr. Corneluis Allston is enrolled in a chronic special needs plan for Diabetes. Reviewed and updated care plan.  Client admitted to [facility] on 08/28/19 for acute heart failure and atrial fibrillation with cardioversion.  Discharged home on 09/01/19.  Goals Addressed            This Visit's Progress   . Client verbalize knowledge of Heart Failure disease self management skills by next 6 months      . Client will report no worsening of symptoms of Atrial Fibrillation within the next 9 months   Not on track   . Client will verbalize understanding of treatment plan for impaired skin integrity and follow up with provider by next 6 months   On track    [place] providing dressing changes    . General - Client will not be readmitted within 30 days (C-SNP)discharge date 09/01/19       Please follow discharge instructions and call provider if you have any questions. Please attend all follow up appointments as scheduled. Please take your medications as prescribed. Please call 24 Hour nurse advice line as needed [phone]).      Interdisciplinary care plan updated. Client to continue to have Home Health services from [place]:  Send  outreach letter with a copy of their individualized care plan, Send individual care plan to provider and Send educational material  Transition of care call to be completed by [facility] utilization management team and RNCM will follow up per their recommendations. Chronic care management coordinator will continue to monitor progress and outreach as scheduled per tier level and as needed       [place], Jackquline Denmark, [place] Management [phone]

## 2019-09-05 ENCOUNTER — Encounter: Payer: Self-pay | Admitting: Cardiology

## 2019-09-05 ENCOUNTER — Other Ambulatory Visit: Payer: Self-pay

## 2019-09-05 ENCOUNTER — Other Ambulatory Visit: Payer: HMO

## 2019-09-05 ENCOUNTER — Ambulatory Visit: Payer: HMO | Admitting: Family Medicine

## 2019-09-05 DIAGNOSIS — I5032 Chronic diastolic (congestive) heart failure: Secondary | ICD-10-CM

## 2019-09-05 NOTE — Telephone Encounter (Signed)
error 

## 2019-09-06 ENCOUNTER — Other Ambulatory Visit: Payer: Self-pay

## 2019-09-06 ENCOUNTER — Telehealth: Payer: Self-pay | Admitting: Cardiology

## 2019-09-06 LAB — BASIC METABOLIC PANEL
BUN/Creatinine Ratio: 23 (calc) — ABNORMAL HIGH (ref 6–22)
BUN: 48 mg/dL — ABNORMAL HIGH (ref 7–25)
CO2: 31 mmol/L (ref 20–32)
Calcium: 8.9 mg/dL (ref 8.6–10.3)
Chloride: 91 mmol/L — ABNORMAL LOW (ref 98–110)
Creat: 2.09 mg/dL — ABNORMAL HIGH (ref 0.70–1.25)
Glucose, Bld: 118 mg/dL — ABNORMAL HIGH (ref 65–99)
Potassium: 5.5 mmol/L — ABNORMAL HIGH (ref 3.5–5.3)
Sodium: 131 mmol/L — ABNORMAL LOW (ref 135–146)

## 2019-09-06 NOTE — Telephone Encounter (Signed)
Discontinue potassium supplementation.  Increase Demadex to 20 mg twice daily.  Check potassium and renal function in 1 week.  Please arrange follow-up visit with either me or APP. Kirk Ruths

## 2019-09-06 NOTE — Patient Outreach (Signed)
  [place] [facility]) Care Management Chronic Special Needs Program   09/06/2019  Name: William Alvarado, DOB: [birth year]  MRN: [id number]  The client was discussed in today's interdisciplinary care team meeting.  The following issues were discussed:  Client's needs, Changes in health status, Care Plan, Coordination of care and Care transitions  Participants present:   Thea Silversmith, MSN, RN, CCM   Cyan Moultrie RN,BSN,CCM, CDE  Quinn Plowman RN, BSN, CCM Kelli Churn, RN, CCM, CDE  Marco Collie, MD Maryella Shivers, MD  Bary Castilla, RN, BSN, MS, CCM Coralie Carpen, MD [place] Care CBCS/CMAA [facility] Karma Lew RN Teresa Pelton RN, BSN  Recommendations:   [facility] team will continue to make outreach attempts to engage member for services Transition of care call to be completed by [facility] utilization management team and RNCM will follow up per their recommendations.   Plan:  RNCM will continue to monitor progress and outreach as scheduled and as needed   Peter Garter RN, Jackquline Denmark, [place] Management [phone]

## 2019-09-06 NOTE — Telephone Encounter (Signed)
New Message  Patient is calling in to go over results. Please give patient's wife a call back with results at [phone].

## 2019-09-06 NOTE — Telephone Encounter (Signed)
Spoke with [name] (on DPR) and they are anxious to hear about the results of the BMET drawn yesterday...  Will forward to Dr. Lyda Kalata for review.. K 5.5.

## 2019-09-06 NOTE — Telephone Encounter (Signed)
Discussed with dr Stanford Breed, will keep the Demadex dosage the same. Patient has an appointment 09/12/2019.

## 2019-09-06 NOTE — Telephone Encounter (Signed)
Spoke with pt wife, aware to stop potassium, she reports his weight is continuing to go up 1 lb daily. He will continue with torsemide 40 mg twice daily. Will discuss with dr Stanford Breed.

## 2019-09-07 ENCOUNTER — Ambulatory Visit: Payer: HMO | Attending: Internal Medicine

## 2019-09-07 ENCOUNTER — Other Ambulatory Visit: Payer: Self-pay

## 2019-09-07 ENCOUNTER — Telehealth: Payer: Self-pay | Admitting: Cardiology

## 2019-09-07 DIAGNOSIS — Z20822 Contact with and (suspected) exposure to covid-19: Secondary | ICD-10-CM

## 2019-09-07 NOTE — Telephone Encounter (Signed)
New Message    Pts wife is calling and says her and the pt were exposed to covid on 12/14. They went to get tested today and are now self quarantined. Rescheduled pts TOC appt  Pts wife is wondering if the pt can be seen virtually for this appt.     [place] call

## 2019-09-07 NOTE — Telephone Encounter (Signed)
I think Virtual visit is more ideal in this case. If he has a iphone or android, I can even do video conference visit

## 2019-09-08 ENCOUNTER — Telehealth: Payer: Self-pay | Admitting: Cardiology

## 2019-09-08 LAB — NOVEL CORONAVIRUS, NAA: SARS-CoV-2, NAA: NOT DETECTED

## 2019-09-08 MED ORDER — FUROSEMIDE 20 MG PO TABS: 40.0000 mg | ORAL_TABLET | Freq: Two times a day (BID) | ORAL | 3 refills | Status: AC

## 2019-09-08 NOTE — Telephone Encounter (Signed)
Spoke with pt wife, she would like to try the furosemide again, she does not feel the demadex is working. Okay per dr Stanford Breed to stop torsemide and restart furosemide 40 mg twice daily and an extra 40 mg as needed for weight gain.

## 2019-09-08 NOTE — Telephone Encounter (Signed)
Needs APP fuov Kirk Ruths

## 2019-09-08 NOTE — Telephone Encounter (Signed)
Pt c/o swelling: STAT is pt has developed SOB within 24 hours  1) How much weight have you gained and in what time span? 5 lbs in a week, 2 lbs since yesterday.     2) If swelling, where is the swelling located? Upper legs and abdomin   3) Are you currently taking a fluid pill? Yes   4) Are you currently SOB? No  5) Do you have a log of your daily weights (if so, list)?   09/01/19 328   09/03/19 329   09/04/19 330  09/05/19 331  09/06/19 331  09/07/19 331  09/08/19 333  6) Have you gained 3 pounds in a day or 5 pounds in a week? Yes, 5 lbs in a week   7) Have you traveled recently? No   Patients wife is calling stating the patient has been gaining weight since switched to  torsemide (DEMADEX) 20 MG tablet. She would like to know if Dr. Stanford Breed feels the medication or dosage needs be changed to resolve the issue. Please Advise.

## 2019-09-08 NOTE — Telephone Encounter (Signed)
Patients message forwarded to Dr Stanford Breed

## 2019-09-08 NOTE — Telephone Encounter (Signed)
Take additional 20 mg demadex for weight gain of 3 lbs; bmet one week; schedule paov when possible Kirk Ruths

## 2019-09-08 NOTE — Telephone Encounter (Signed)
Patient has been exposed to [place], his home health nurse tested for positive for COVID on Wednesday.  He currently can't come into the office.

## 2019-09-09 ENCOUNTER — Telehealth: Payer: Self-pay | Admitting: General Practice

## 2019-09-09 NOTE — Telephone Encounter (Signed)
Negative COVID results given. Patient results "NOT Detected." Caller expressed understanding. ° °

## 2019-09-11 ENCOUNTER — Inpatient Hospital Stay: Payer: HMO | Admitting: Family Medicine

## 2019-09-12 ENCOUNTER — Ambulatory Visit (INDEPENDENT_AMBULATORY_CARE_PROVIDER_SITE_OTHER): Payer: HMO | Admitting: Family Medicine

## 2019-09-12 ENCOUNTER — Ambulatory Visit: Payer: HMO | Admitting: Physician Assistant

## 2019-09-12 ENCOUNTER — Other Ambulatory Visit: Payer: Self-pay

## 2019-09-12 ENCOUNTER — Encounter: Payer: Self-pay | Admitting: Family Medicine

## 2019-09-12 VITALS — BP 128/72 | HR 48 | Temp 97.2°F | Resp 22 | Ht 73.0 in | Wt 334.0 lb

## 2019-09-12 DIAGNOSIS — I872 Venous insufficiency (chronic) (peripheral): Secondary | ICD-10-CM | POA: Diagnosis not present

## 2019-09-12 DIAGNOSIS — I5042 Chronic combined systolic (congestive) and diastolic (congestive) heart failure: Secondary | ICD-10-CM | POA: Diagnosis not present

## 2019-09-12 DIAGNOSIS — L97209 Non-pressure chronic ulcer of unspecified calf with unspecified severity: Secondary | ICD-10-CM | POA: Diagnosis not present

## 2019-09-12 DIAGNOSIS — I83002 Varicose veins of unspecified lower extremity with ulcer of calf: Secondary | ICD-10-CM

## 2019-09-12 DIAGNOSIS — N179 Acute kidney failure, unspecified: Secondary | ICD-10-CM | POA: Diagnosis not present

## 2019-09-12 NOTE — Progress Notes (Signed)
Subjective:    Patient ID: William Alvarado, male    DOB: November 22, 1950, 68 y.o.   MRN: [id number]  HPI  Patient is here today for hospital discharge follow-up.  Was recently admitted to the hospital with shortness of breath due to congestive heart failure.  He had been seen previously as an outpatient but was unsuccessful in diuresis on oral diuretics.  Patient was switched to IV Lasix and diuresed approximately 6 L at which point his breathing improved and he was discharged home.  Patient was discharged home on Lasix, 20 mg tablets, 2 tablets twice daily.  Therefore he is taking 40 mg twice daily.  Recently he had lab work obtained which showed that his creatinine had risen from 1.5 to greater than 2.0 suggesting possible dehydration.  However due to swelling in his legs and swelling in his abdomen and ineffective diuresis at home, his Lasix dose was recently increased to 60 mg in the morning and 40 mg in the evening.  Due to an elevated potassium, however the patient's potassium supplement was discontinued.  Here today, the patient's lungs are clear to auscultation bilaterally except for a few isolated expiratory wheezes.  He has a large protuberant firm abdomen.  However there is only trace edema in his legs due to compression wraps.  There is however significant venous stasis ulcers and weeping sores per tickly on his right anterior shin.  Please see the photograph below:   The great toenail on the patient's right foot also recently was removed after suffering an injury.  A blister due to edema in the right foot developed circumferentially around the proximal portion of the right great toe and has since ruptured.  There is foul-smelling drainage coming from this area.  The drainage is serous in nature.  However the patient's toe definitely admits a foul odor from both the nailbed and from the proximal portion.  The toe is purple in color as is the rest of his foot.  There is no evidence of ischemia or  necrosis however the toe is cool to the touch.  Recent TBI in the right foot was normal in September suggesting this discoloration is due to venous congestion rather than arterial insufficiency. Past Medical History:  Diagnosis Date  . Acute respiratory failure with hypoxia (HCC)    Hospitalized with acute respiratory failure secondary to CAP, diastolic CHF, and pulmonary embolism  . AKI (acute kidney injury) (HCC)   . Allergy    allergic rhinitis  . Altered mental status   . Atrial fibrillation (HCC)   . Chronic anticoagulation 03/26/2017   Failed Coumadin (PE with therapeutic INR). Now on Xarelto  . Colon polyps   . Community acquired pneumonia of right lower lobe of lung   . COPD (chronic obstructive pulmonary disease) (HCC)   . Degenerative disc disease, lumbar   . Diabetes mellitus without complication (HCC)   . History of pulmonary embolus (PE) 03/26/2017  . Hypertension   . Mitral regurgitation 12/26/2012  . Obesity (BMI 30-39.9) 12/09/2012  . PAF (paroxysmal atrial fibrillation) (HCC)    DCCV in 2014 with recurrent PAF in setting of respiratory failure June 2018. S/P DCCV 04/13/17 to NSR  . Persistent atrial fibrillation (HCC)   . Pneumonia ~ 2016  . Sepsis (HCC) 03/10/2017  . Sleep apnea   . Systolic murmur   . Type II diabetes mellitus (HCC)    Past Surgical History:  Procedure Laterality Date  . CARDIOVERSION N/A 01/09/2013   Procedure: CARDIOVERSION;  Surgeon: Pricilla Riffle, MD;  Location: [facility] ENDOSCOPY;  Service: Cardiovascular;  Laterality: N/A;  . CARDIOVERSION N/A 04/13/2017   Procedure: CARDIOVERSION;  Surgeon: Quintella Reichert, MD;  Location: [facility] ENDOSCOPY;  Service: Cardiovascular;  Laterality: N/A;  . CARDIOVERSION N/A 09/01/2019   Procedure: CARDIOVERSION;  Surgeon: Jake Bathe, MD;  Location: [facility] ENDOSCOPY;  Service: Cardiovascular;  Laterality: N/A;  . TEE WITHOUT CARDIOVERSION N/A 01/09/2013   Procedure: TRANSESOPHAGEAL ECHOCARDIOGRAM (TEE);  Surgeon: Pricilla Riffle,  MD;  Location: [facility] ENDOSCOPY;  Service: Cardiovascular;  Laterality: N/A;   Current Outpatient Medications on File Prior to Visit  Medication Sig Dispense Refill  . acetaminophen (TYLENOL) 500 MG tablet Take 1,000 mg by mouth 2 (two) times daily as needed (pain).    Melene Muller ON 09/16/2019] amiodarone (PACERONE) 200 MG tablet Take 1 tablet (200 mg total) by mouth daily. Start after finished the 400 mg tabs, in 2 weeks. 30 tablet 5  . amiodarone (PACERONE) 400 MG tablet Take 1 tablet (400 mg total) by mouth 2 (two) times daily for 7 days, THEN 1 tablet (400 mg total) daily for 7 days. Then 200 mg daily. 21 tablet 0  . Dulaglutide (TRULICITY) 1.5 MG/0.5ML SOPN INJECT 1.5 MG INTO THE SKIN ONCE A WEEK (Patient taking differently: Inject 1.5 mg into the skin every Friday. ) 4 pen 3  . furosemide (LASIX) 20 MG tablet Take 2 tablets (40 mg total) by mouth 2 (two) times daily. 90 tablet 3  . metoprolol tartrate (LOPRESSOR) 50 MG tablet Take 1 tablet by mouth twice daily (Patient taking differently: Take 50 mg by mouth 2 (two) times daily. ) 180 tablet 0  . pravastatin (PRAVACHOL) 20 MG tablet Take 1 tablet (20 mg total) by mouth daily. 90 tablet 3  . rivaroxaban (XARELTO) 20 MG TABS tablet TAKE 1 TABLET BY MOUTH ONCE DAILY WITH SUPPER (Patient taking differently: Take 20 mg by mouth daily with supper. ) 90 tablet 3  . sitaGLIPtin (JANUVIA) 100 MG tablet Take 1 tablet (100 mg total) by mouth daily. (Patient taking differently: Take 100 mg by mouth daily with supper. ) 90 tablet 2  . OXYGEN Inhale 2 L into the lungs continuous.    . potassium chloride SA (KLOR-CON) 20 MEQ tablet Take 1 tablet (20 mEq total) by mouth daily. (Patient not taking: Reported on 09/12/2019) 180 tablet 3   No current facility-administered medications on file prior to visit.   No Known Allergies Social History   Socioeconomic History  . Marital status: Married    Spouse name: Not on file  . Number of children: 2  . Years of  education: Not on file  . Highest education level: Not on file  Occupational History    Comment: Machinist  Tobacco Use  . Smoking status: Former Smoker    Packs/day: 1.00    Years: 43.00    Pack years: 43.00    Types: Cigarettes    Quit date: 03/08/2017    Years since quitting: 2.5  . Smokeless tobacco: Never Used  Substance and Sexual Activity  . Alcohol use: No    Comment: Rare  . Drug use: No  . Sexual activity: Not Currently  Other Topics Concern  . Not on file  Social History Narrative  . Not on file   Social Determinants of Health   Financial Resource Strain:   . Difficulty of Paying Living Expenses: Not on file  Food Insecurity: No Food Insecurity  . Worried About Radiation protection practitioner  of Food in the Last Year: Never true  . Ran Out of Food in the Last Year: Never true  Transportation Needs: No Transportation Needs  . Lack of Transportation (Medical): No  . Lack of Transportation (Non-Medical): No  Physical Activity:   . Days of Exercise per Week: Not on file  . Minutes of Exercise per Session: Not on file  Stress:   . Feeling of Stress : Not on file  Social Connections:   . Frequency of Communication with Friends and Family: Not on file  . Frequency of Social Gatherings with Friends and Family: Not on file  . Attends Religious Services: Not on file  . Active Member of Clubs or Organizations: Not on file  . Attends Archivist Meetings: Not on file  . Marital Status: Not on file  Intimate Partner Violence:   . Fear of Current or Ex-Partner: Not on file  . Emotionally Abused: Not on file  . Physically Abused: Not on file  . Sexually Abused: Not on file     Review of Systems  All other systems reviewed and are negative.      Objective:   Physical Exam Vitals reviewed.  Constitutional:      General: He is not in acute distress.    Appearance: He is morbidly obese. He is not toxic-appearing or diaphoretic.  Cardiovascular:     Rate and Rhythm: Normal  rate and regular rhythm.     Heart sounds: Normal heart sounds.  Pulmonary:     Effort: Pulmonary effort is normal. No respiratory distress.     Breath sounds: Wheezing present. No rales.  Abdominal:     General: There is distension.     Palpations: Abdomen is soft.     Tenderness: There is no abdominal tenderness. There is no guarding or rebound.  Musculoskeletal:        General: Swelling present.     Right lower leg: Edema present.     Left lower leg: Edema present.  Skin:    Findings: Lesion present.  Neurological:     Mental Status: He is alert.           Assessment & Plan:  Acute renal failure, unspecified acute renal failure type ([place]) - Plan: BASIC METABOLIC PANEL WITH GFR  Chronic venous insufficiency  Chronic combined systolic and diastolic congestive heart failure (HCC)  Venous stasis ulcer of calf, unspecified laterality, unspecified ulcer stage, unspecified whether varicose veins present ([place])  Patient's renal function has been trending upward after diuresis.  Today he does not appear fluid overloaded on exam.  His lungs sound clear.  Therefore I will recheck a BMP and if his creatinine continues to trend upward I would likely reduce his Lasix back to 40 mg twice daily.  Regarding the venous insufficiency and venous stasis ulcers in his legs, his right leg has numerous areas of skin breakdown as shown in the photograph above.  Each of these areas was covered with Silvadene and nonadherent gauze and then placed in an [employer].  The left leg shows no skin breakdown.  It was simply placed in an [employer].  I have recommended that they change the Unna boot on Friday and I will see the patient back on Monday for reassessment.  TBI's in September showed normal perfusion to the toe.  Therefore I do not feel that the foul odor is due to arterial insufficiency and wet gangrene.  Instead I feel that the patient has chronic venous  congestion causing skin breakdown and weeping  sores to develop on his toe which provide a foul odor when wrapped in gauze.  There is no erythema or evidence of infection.  Therefore I recommended that they cover the wound on the proximal toe with Neosporin and wrap with gauze daily and keep the dressings changed.  I will reassess this on Monday as well.

## 2019-09-13 LAB — BASIC METABOLIC PANEL WITH GFR
BUN/Creatinine Ratio: 24 (calc) — ABNORMAL HIGH (ref 6–22)
BUN: 65 mg/dL — ABNORMAL HIGH (ref 7–25)
CO2: 26 mmol/L (ref 20–32)
Calcium: 8.3 mg/dL — ABNORMAL LOW (ref 8.6–10.3)
Chloride: 92 mmol/L — ABNORMAL LOW (ref 98–110)
Creat: 2.73 mg/dL — ABNORMAL HIGH (ref 0.70–1.25)
GFR, Est African American: 27 mL/min/{1.73_m2} — ABNORMAL LOW (ref >=60)
GFR, Est Non African American: 23 mL/min/{1.73_m2} — ABNORMAL LOW (ref >=60)
Glucose, Bld: 135 mg/dL — ABNORMAL HIGH (ref 65–99)
Potassium: 5.2 mmol/L (ref 3.5–5.3)
Sodium: 132 mmol/L — ABNORMAL LOW (ref 135–146)

## 2019-09-18 ENCOUNTER — Ambulatory Visit (INDEPENDENT_AMBULATORY_CARE_PROVIDER_SITE_OTHER): Payer: HMO | Admitting: Family Medicine

## 2019-09-18 ENCOUNTER — Other Ambulatory Visit: Payer: Self-pay

## 2019-09-18 ENCOUNTER — Encounter: Payer: Self-pay | Admitting: Family Medicine

## 2019-09-18 VITALS — BP 142/90 | HR 53 | Temp 97.6°F | Resp 21 | Ht 73.0 in | Wt 342.0 lb

## 2019-09-18 DIAGNOSIS — N179 Acute kidney failure, unspecified: Secondary | ICD-10-CM | POA: Diagnosis not present

## 2019-09-18 DIAGNOSIS — I872 Venous insufficiency (chronic) (peripheral): Secondary | ICD-10-CM

## 2019-09-18 DIAGNOSIS — L97219 Non-pressure chronic ulcer of right calf with unspecified severity: Secondary | ICD-10-CM

## 2019-09-18 DIAGNOSIS — L97209 Non-pressure chronic ulcer of unspecified calf with unspecified severity: Secondary | ICD-10-CM

## 2019-09-18 DIAGNOSIS — E1152 Type 2 diabetes mellitus with diabetic peripheral angiopathy with gangrene: Secondary | ICD-10-CM

## 2019-09-18 DIAGNOSIS — I83002 Varicose veins of unspecified lower extremity with ulcer of calf: Secondary | ICD-10-CM

## 2019-09-18 DIAGNOSIS — I5042 Chronic combined systolic (congestive) and diastolic (congestive) heart failure: Secondary | ICD-10-CM | POA: Diagnosis not present

## 2019-09-18 DIAGNOSIS — L97229 Non-pressure chronic ulcer of left calf with unspecified severity: Secondary | ICD-10-CM

## 2019-09-18 NOTE — Progress Notes (Signed)
Subjective:    Patient ID: William Alvarado, male    DOB: 02/11/1951, 68 y.o.   MRN: [id number]  HPI 09/12/19 Patient is here today for hospital discharge follow-up.  Was recently admitted to the hospital with shortness of breath due to congestive heart failure.  He had been seen previously as an outpatient but was unsuccessful in diuresis on oral diuretics.  Patient was switched to IV Lasix and diuresed approximately 6 L at which point his breathing improved and he was discharged home.  Patient was discharged home on Lasix, 20 mg tablets, 2 tablets twice daily.  Therefore he is taking 40 mg twice daily.  Recently he had lab work obtained which showed that his creatinine had risen from 1.5 to greater than 2.0 suggesting possible dehydration.  However due to swelling in his legs and swelling in his abdomen and ineffective diuresis at home, his Lasix dose was recently increased to 60 mg in the morning and 40 mg in the evening.  Due to an elevated potassium, however the patient's potassium supplement was discontinued.  Here today, the patient's lungs are clear to auscultation bilaterally except for a few isolated expiratory wheezes.  He has a large protuberant firm abdomen.  However there is only trace edema in his legs due to compression wraps.  There is however significant venous stasis ulcers and weeping sores per tickly on his right anterior shin.  Please see the photograph below:   The great toenail on the patient's right foot also recently was removed after suffering an injury.  A blister due to edema in the right foot developed circumferentially around the proximal portion of the right great toe and has since ruptured.  There is foul-smelling drainage coming from this area.  The drainage is serous in nature.  However the patient's toe definitely admits a foul odor from both the nailbed and from the proximal portion.  The toe is purple in color as is the rest of his foot.  There is no evidence of  ischemia or necrosis however the toe is cool to the touch.  Recent TBI in the right foot was normal in September suggesting this discoloration is due to venous congestion rather than arterial insufficiency.  At that time, my plan was: Patient's renal function has been trending upward after diuresis.  Today he does not appear fluid overloaded on exam.  His lungs sound clear.  Therefore I will recheck a BMP and if his creatinine continues to trend upward I would likely reduce his Lasix back to 40 mg twice daily.  Regarding the venous insufficiency and venous stasis ulcers in his legs, his right leg has numerous areas of skin breakdown as shown in the photograph above.  Each of these areas was covered with Silvadene and nonadherent gauze and then placed in an [employer].  The left leg shows no skin breakdown.  It was simply placed in an [employer].  I have recommended that they change the Unna boot on Friday and I will see the patient back on Monday for reassessment.  TBI's in September showed normal perfusion to the toe.  Therefore I do not feel that the foul odor is due to arterial insufficiency and wet gangrene.  Instead I feel that the patient has chronic venous congestion causing skin breakdown and weeping sores to develop on his toe which provide a foul odor when wrapped in gauze.  There is no erythema or evidence of infection.  Therefore I recommended that they cover  the wound on the proximal toe with Neosporin and wrap with gauze daily and keep the dressings changed.  I will reassess this on Monday as well.  09/18/19 Creatinine had risen substantially from 2-2.73.  Therefore I recommend reducing Lasix to 20 mg twice a day as the patient was appearing dehydrated.  He is here today to recheck his legs as well as his renal function.  The wounds on his anterior right shin and posterior right calf are essentially unchanged although the edema is much better.  The edema in his left leg has improved dramatically  and there are only trace small ulcers on the lower anterior left shin.  However the patient has gained significant weight since his visit when I decreased his Lasix. Wt Readings from Last 3 Encounters:  09/18/19 (!) 342 lb (155.1 kg)  09/12/19 (!) 334 lb (151.5 kg)  09/01/19 (!) 328 lb 7.8 oz (149 kg)   He has gained 8 pounds.  He also reports worsening dyspnea on exertion.  Patient appears fluid overloaded with increasing abdominal girth.  Therefore I feel that he needs diuresis despite his worsening renal function.  Also his left great toe has a expanding foul-smelling ulcer and area of desquamation proximal to the toenail/nailbed.  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  The odor is quite foul and smells like necrosis.  Despite his normal TBI's this appears to be ischemic.  Therefore I have recommended that he follow-up with vascular surgery as I feel the toe may require amputation.   Past Medical History:  Diagnosis Date  . Acute respiratory failure with hypoxia (HCC)    Hospitalized with acute respiratory failure secondary to CAP, diastolic CHF, and pulmonary embolism  . AKI (acute kidney injury) (HCC)   . Allergy    allergic rhinitis  . Altered mental status   . Atrial fibrillation (HCC)   . Chronic anticoagulation 03/26/2017   Failed Coumadin (PE with therapeutic INR). Now on Xarelto  . Colon polyps   . Community acquired pneumonia of right lower lobe of lung   . COPD (chronic obstructive pulmonary disease) (HCC)   . Degenerative disc disease, lumbar   . Diabetes mellitus without complication (HCC)   . History of pulmonary embolus (PE) 03/26/2017  . Hypertension   . Mitral regurgitation 12/26/2012  . Obesity (BMI 30-39.9) 12/09/2012  . PAF (paroxysmal atrial fibrillation) (HCC)    DCCV in 2014 with recurrent PAF in setting of respiratory failure June 2018. S/P DCCV 04/13/17 to NSR  . Persistent atrial fibrillation (HCC)   . Pneumonia ~ 2016  . Sepsis (HCC) 03/10/2017  . Sleep apnea   .  Systolic murmur   . Type II diabetes mellitus (HCC)    Past Surgical History:  Procedure Laterality Date  . CARDIOVERSION N/A 01/09/2013   Procedure: CARDIOVERSION;  Surgeon: Pricilla Riffle, MD;  Location: [facility] ENDOSCOPY;  Service: Cardiovascular;  Laterality: N/A;  . CARDIOVERSION N/A 04/13/2017   Procedure: CARDIOVERSION;  Surgeon: Quintella Reichert, MD;  Location: [facility] ENDOSCOPY;  Service: Cardiovascular;  Laterality: N/A;  . CARDIOVERSION N/A 09/01/2019   Procedure: CARDIOVERSION;  Surgeon: Jake Bathe, MD;  Location: [facility] ENDOSCOPY;  Service: Cardiovascular;  Laterality: N/A;  . TEE WITHOUT CARDIOVERSION N/A 01/09/2013   Procedure: TRANSESOPHAGEAL ECHOCARDIOGRAM (TEE);  Surgeon: Pricilla Riffle, MD;  Location: [facility] ENDOSCOPY;  Service: Cardiovascular;  Laterality: N/A;   Current Outpatient Medications on File Prior to Visit  Medication Sig Dispense Refill  . acetaminophen (TYLENOL) 500 MG tablet Take  1,000 mg by mouth 2 (two) times daily as needed (pain).    Marland Kitchen amiodarone (PACERONE) 200 MG tablet Take 1 tablet (200 mg total) by mouth daily. Start after finished the 400 mg tabs, in 2 weeks. 30 tablet 5  . amiodarone (PACERONE) 400 MG tablet Take 1 tablet (400 mg total) by mouth 2 (two) times daily for 7 days, THEN 1 tablet (400 mg total) daily for 7 days. Then 200 mg daily. 21 tablet 0  . Dulaglutide (TRULICITY) 1.5 HY/0.7PX SOPN INJECT 1.5 MG INTO THE SKIN ONCE A WEEK (Patient taking differently: Inject 1.5 mg into the skin every Friday. ) 4 pen 3  . furosemide (LASIX) 20 MG tablet Take 2 tablets (40 mg total) by mouth 2 (two) times daily. 90 tablet 3  . metoprolol tartrate (LOPRESSOR) 50 MG tablet Take 1 tablet by mouth twice daily (Patient taking differently: Take 50 mg by mouth 2 (two) times daily. ) 180 tablet 0  . OXYGEN Inhale 2 L into the lungs continuous.    . potassium chloride SA (KLOR-CON) 20 MEQ tablet Take 1 tablet (20 mEq total) by mouth daily. (Patient not taking: Reported on 09/12/2019)  180 tablet 3  . pravastatin (PRAVACHOL) 20 MG tablet Take 1 tablet (20 mg total) by mouth daily. 90 tablet 3  . rivaroxaban (XARELTO) 20 MG TABS tablet TAKE 1 TABLET BY MOUTH ONCE DAILY WITH SUPPER (Patient taking differently: Take 20 mg by mouth daily with supper. ) 90 tablet 3  . sitaGLIPtin (JANUVIA) 100 MG tablet Take 1 tablet (100 mg total) by mouth daily. (Patient taking differently: Take 100 mg by mouth daily with supper. ) 90 tablet 2   No current facility-administered medications on file prior to visit.   No Known Allergies Social History   Socioeconomic History  . Marital status: Married    Spouse name: Not on file  . Number of children: 2  . Years of education: Not on file  . Highest education level: Not on file  Occupational History    Comment: Machinist  Tobacco Use  . Smoking status: Former Smoker    Packs/day: 1.00    Years: 43.00    Pack years: 43.00    Types: Cigarettes    Quit date: 03/08/2017    Years since quitting: 2.5  . Smokeless tobacco: Never Used  Substance and Sexual Activity  . Alcohol use: No    Comment: Rare  . Drug use: No  . Sexual activity: Not Currently  Other Topics Concern  . Not on file  Social History Narrative  . Not on file   Social Determinants of Health   Financial Resource Strain:   . Difficulty of Paying Living Expenses: Not on file  Food Insecurity: No Food Insecurity  . Worried About Charity fundraiser in the Last Year: Never true  . Ran Out of Food in the Last Year: Never true  Transportation Needs: No Transportation Needs  . Lack of Transportation (Medical): No  . Lack of Transportation (Non-Medical): No  Physical Activity:   . Days of Exercise per Week: Not on file  . Minutes of Exercise per Session: Not on file  Stress:   . Feeling of Stress : Not on file  Social Connections:   . Frequency of Communication with Friends and Family: Not on file  . Frequency of Social Gatherings with Friends and Family: Not on file    . Attends Religious Services: Not on file  . Active Member of Clubs  or Organizations: Not on file  . Attends Banker Meetings: Not on file  . Marital Status: Not on file  Intimate Partner Violence:   . Fear of Current or Ex-Partner: Not on file  . Emotionally Abused: Not on file  . Physically Abused: Not on file  . Sexually Abused: Not on file     Review of Systems  All other systems reviewed and are negative.      Objective:   Physical Exam Vitals reviewed.  Constitutional:      General: He is not in acute distress.    Appearance: He is morbidly obese. He is not toxic-appearing or diaphoretic.  Cardiovascular:     Rate and Rhythm: Normal rate and regular rhythm.     Heart sounds: Normal heart sounds.  Pulmonary:     Effort: Pulmonary effort is normal. No respiratory distress.     Breath sounds: Wheezing present. No rales.  Abdominal:     General: There is distension.     Palpations: Abdomen is soft.     Tenderness: There is no abdominal tenderness. There is no guarding or rebound.  Musculoskeletal:        General: Swelling present.     Right lower leg: Edema present.     Left lower leg: Edema present.  Skin:    Findings: Lesion present.  Neurological:     Mental Status: He is alert.           Assessment & Plan:  Acute renal failure, unspecified acute renal failure type (HCC) - Plan: BASIC METABOLIC PANEL WITH GFR, BASIC METABOLIC PANEL WITH GFR  Chronic venous insufficiency  Chronic combined systolic and diastolic congestive heart failure (HCC)  Venous stasis ulcer of calf, unspecified laterality, unspecified ulcer stage, unspecified whether varicose veins present (HCC)  Diabetic wet gangrene of the foot (HCC)  Unna boots were reapplied to both legs.  We will recheck the patient on Thursday to change his Unna boots at that time.  Venous stasis ulcers particular on the right leg are essentially unchanged.  There is no evidence of secondary  cellulitis on either leg.  Patient appears fluid overloaded and has gained 8 pounds since reducing his Lasix.  Therefore despite his acute renal failure with worsening creatinine I feel we have no choice.  I feel the patient would likely wind up back in the hospital with pulmonary edema.  Therefore we will increase his Lasix back to 40 mg twice daily and he will use Zaroxolyn in the morning tomorrow prior to Lasix and also in the morning Wednesday prior to Lasix.  I will recheck the patient on Thursday and repeat his renal function at that time.  We will likely hold Zaroxolyn at that point and see if he can tolerate just the 40 mg twice daily of Lasix from them moving forward.  My biggest concern is also the foul-smelling ulcer on the anterior surface of his left great toe that appears to be wet gangrene.  I will consult vascular surgery but I feel this toe likely requires amputation.  We will get a second opinion.

## 2019-09-19 ENCOUNTER — Telehealth: Payer: Self-pay

## 2019-09-19 ENCOUNTER — Ambulatory Visit: Payer: Self-pay

## 2019-09-19 LAB — BASIC METABOLIC PANEL WITH GFR
BUN/Creatinine Ratio: 25 (calc) — ABNORMAL HIGH (ref 6–22)
BUN: 52 mg/dL — ABNORMAL HIGH (ref 7–25)
CO2: 26 mmol/L (ref 20–32)
Calcium: 8.3 mg/dL — ABNORMAL LOW (ref 8.6–10.3)
Chloride: 97 mmol/L — ABNORMAL LOW (ref 98–110)
Creat: 2.11 mg/dL — ABNORMAL HIGH (ref 0.70–1.25)
GFR, Est African American: 37 mL/min/{1.73_m2} — ABNORMAL LOW (ref >=60)
GFR, Est Non African American: 32 mL/min/{1.73_m2} — ABNORMAL LOW (ref >=60)
Glucose, Bld: 123 mg/dL — ABNORMAL HIGH (ref 65–99)
Potassium: 4.6 mmol/L (ref 3.5–5.3)
Sodium: 135 mmol/L (ref 135–146)

## 2019-09-19 NOTE — Telephone Encounter (Signed)

## 2019-09-20 ENCOUNTER — Other Ambulatory Visit: Payer: Self-pay | Admitting: Cardiology

## 2019-09-21 ENCOUNTER — Other Ambulatory Visit: Payer: Self-pay

## 2019-09-21 ENCOUNTER — Telehealth (INDEPENDENT_AMBULATORY_CARE_PROVIDER_SITE_OTHER): Payer: HMO | Admitting: Physician Assistant

## 2019-09-21 ENCOUNTER — Encounter: Payer: Self-pay | Admitting: Family Medicine

## 2019-09-21 ENCOUNTER — Ambulatory Visit (INDEPENDENT_AMBULATORY_CARE_PROVIDER_SITE_OTHER): Payer: HMO | Admitting: Family Medicine

## 2019-09-21 ENCOUNTER — Encounter: Payer: Self-pay | Admitting: Physician Assistant

## 2019-09-21 VITALS — BP 148/82 | HR 68 | Temp 98.5°F | Resp 20 | Ht 73.0 in | Wt 339.0 lb

## 2019-09-21 DIAGNOSIS — I5032 Chronic diastolic (congestive) heart failure: Secondary | ICD-10-CM

## 2019-09-21 DIAGNOSIS — Z86711 Personal history of pulmonary embolism: Secondary | ICD-10-CM

## 2019-09-21 DIAGNOSIS — E875 Hyperkalemia: Secondary | ICD-10-CM

## 2019-09-21 DIAGNOSIS — G4733 Obstructive sleep apnea (adult) (pediatric): Secondary | ICD-10-CM

## 2019-09-21 DIAGNOSIS — E1152 Type 2 diabetes mellitus with diabetic peripheral angiopathy with gangrene: Secondary | ICD-10-CM

## 2019-09-21 DIAGNOSIS — I110 Hypertensive heart disease with heart failure: Secondary | ICD-10-CM | POA: Diagnosis not present

## 2019-09-21 DIAGNOSIS — I472 Ventricular tachycardia: Secondary | ICD-10-CM

## 2019-09-21 DIAGNOSIS — I83002 Varicose veins of unspecified lower extremity with ulcer of calf: Secondary | ICD-10-CM

## 2019-09-21 DIAGNOSIS — I480 Paroxysmal atrial fibrillation: Secondary | ICD-10-CM

## 2019-09-21 DIAGNOSIS — I4729 Other ventricular tachycardia: Secondary | ICD-10-CM

## 2019-09-21 DIAGNOSIS — I10 Essential (primary) hypertension: Secondary | ICD-10-CM

## 2019-09-21 DIAGNOSIS — L97209 Non-pressure chronic ulcer of unspecified calf with unspecified severity: Secondary | ICD-10-CM | POA: Diagnosis not present

## 2019-09-21 DIAGNOSIS — I5042 Chronic combined systolic (congestive) and diastolic (congestive) heart failure: Secondary | ICD-10-CM

## 2019-09-21 DIAGNOSIS — I872 Venous insufficiency (chronic) (peripheral): Secondary | ICD-10-CM | POA: Diagnosis not present

## 2019-09-21 DIAGNOSIS — N189 Chronic kidney disease, unspecified: Secondary | ICD-10-CM

## 2019-09-21 DIAGNOSIS — E119 Type 2 diabetes mellitus without complications: Secondary | ICD-10-CM

## 2019-09-21 NOTE — Progress Notes (Signed)
Virtual Visit via Telephone Note   This visit type was conducted due to national recommendations for restrictions regarding the COVID-19 Pandemic (e.g. social distancing) in an effort to limit this patient's exposure and mitigate transmission in our community.  Due to his co-morbid illnesses, this patient is at least at moderate risk for complications without adequate follow up.  This format is felt to be most appropriate for this patient at this time.  The patient did not have access to video technology/had technical difficulties with video requiring transitioning to audio format only (telephone).  All issues noted in this document were discussed and addressed.  No physical exam could be performed with this format.  Please refer to the patient's chart for his  consent to telehealth for William Al[facility]ado.   Date:  09/23/2019   ID:  William Alvarado, DOB [birth year], MRN [id number]  Patient Location: Home Provider Location: Office  PCP:  Donita Brooks, William Alvarado  Cardiologist:  Olga Millers, William Alvarado  Electrophysiologist:  None   Evaluation Performed:  Follow-Up Visit  Chief Complaint:  Hospital followup  History of Present Illness:    William Alvarado is a 68 y.o. male with past medical history of paroxysmal atrial fibrillation, history of PE, COPD, hypertension, DM 2, and OSA intolerant of CPAP.  CTA in June 2018 showed a small nonocclusive PE and he was placed on anticoagulation therapy.  Echocardiogram in August 2019 showed normal LV systolic function, D-shaped septum, mild MR.  ABI in September 2020 was normal.  Lower extremity venous Doppler in November 2020 showed no DVT.  He was most recently seen by Dr. Jens Som on 08/22/2019 at which time he noticed increasing dyspnea.  EKG showed he was in persistent atrial fibrillation.  Dr. Jens Som initially recommended a trial of diuresis to see if his symptoms would improve.  His furosemide was switched to torsemide.  However patient did not have  significant IV diuresis and that his weight continued to gain.  Patient was later directed to the ED for IV diuresis.  Patient was admitted to cardiology service for IV diuresis with plan of cardioversion after adequate diuresis.  During the hospitalization, he had episode of 41 beats of monomorphic VT 12/8, amiodarone therapy was added to his medical regimen.  He eventually underwent a successful cardioversion on 12/11 by Dr. Anne Fu.  He eventually was diuresed 6.6 L and reached a dry weight of 149 kg.  He was instructed to continue amiodarone 400 mg twice daily for 7 days then taper down to 400 mg daily for 1 week then to 200 mg daily thereafter.  Based on the phone call, patient did not respond very well to the torsemide and was restarted on Lasix 40 mg twice daily.  Since discharge, patient had AKI and hyperkalemia on the current diuretic.  Creatinine trended up to 2.7 before trending back down.  Patient was contacted via telephone visit.  Initially it was attempted to try the video visit, however due to technical difficulty this was switched to telephone visit instead.  He denies any chest pain or shortness of breath.  After he switched back from the torsemide to Lasix, he has noticed significant urinary output.  He is weight is coming back down as well.  3 days ago, he was 342 pounds in Dr. Caren Macadam office, this morning he is down to 339 pounds.  He denies significant lower extremity edema.  His heart rate is very well controlled during recent doctors office visit.  No vital sign  was provided today.  I recommended the patient continue with salt and fluid restriction and they will keep a weight diary.  I plan to bring the patient back in 3 to 4 weeks for weight check and also EKG to make sure he is maintaining sinus rhythm.  Amiodarone has already been down to 200 mg daily according to the patient and he is tolerating the medication okay.  He did obtain repeat lab work this morning, the lab work has not  resulted yet.  I will defer to Dr. Dennard Schaumann to follow-up on his renal function.  The patient does not have symptoms concerning for COVID-19 infection (fever, chills, cough, or new shortness of breath).    Past Medical History:  Diagnosis Date  . Acute respiratory failure with hypoxia (HCC)    Hospitalized with acute respiratory failure secondary to CAP, diastolic CHF, and pulmonary embolism  . AKI (acute kidney injury) ([place])   . Allergy    allergic rhinitis  . Altered mental status   . Atrial fibrillation ([place])   . Chronic anticoagulation 03/26/2017   Failed Coumadin (PE with therapeutic INR). Now on Xarelto  . Colon polyps   . Community acquired pneumonia of right lower lobe of lung   . COPD (chronic obstructive pulmonary disease) ([place])   . Degenerative disc disease, lumbar   . Diabetes mellitus without complication ([place])   . History of pulmonary embolus (PE) 03/26/2017  . Hypertension   . Mitral regurgitation 12/26/2012  . Obesity (BMI 30-39.9) 12/09/2012  . PAF (paroxysmal atrial fibrillation) ([place])    DCCV in 2014 with recurrent PAF in setting of respiratory failure June 2018. S/P DCCV 04/13/17 to NSR  . Persistent atrial fibrillation ([place])   . Pneumonia ~ 2016  . Sepsis ([place]) 03/10/2017  . Sleep apnea   . Systolic murmur   . Type II diabetes mellitus ([place])    Past Surgical History:  Procedure Laterality Date  . CARDIOVERSION N/A 01/09/2013   Procedure: CARDIOVERSION;  Surgeon: Fay Records, William Alvarado;  Location: [facility];  Service: Cardiovascular;  Laterality: N/A;  . CARDIOVERSION N/A 04/13/2017   Procedure: CARDIOVERSION;  Surgeon: Sueanne Margarita, William Alvarado;  Location: [facility];  Service: Cardiovascular;  Laterality: N/A;  . CARDIOVERSION N/A 09/01/2019   Procedure: CARDIOVERSION;  Surgeon: Jerline Pain, William Alvarado;  Location: [facility] ENDOSCOPY;  Service: Cardiovascular;  Laterality: N/A;  . TEE WITHOUT CARDIOVERSION N/A 01/09/2013   Procedure: TRANSESOPHAGEAL ECHOCARDIOGRAM (TEE);  Surgeon:  Fay Records, William Alvarado;  Location: [facility] ENDOSCOPY;  Service: Cardiovascular;  Laterality: N/A;     Current Meds  Medication Sig  . acetaminophen (TYLENOL) 500 MG tablet Take 1,000 mg by mouth 2 (two) times daily as needed (pain).  Marland Kitchen amiodarone (PACERONE) 200 MG tablet Take 1 tablet (200 mg total) by mouth daily. Start after finished the 400 mg tabs, in 2 weeks.  . Dulaglutide (TRULICITY) 1.5 PY/1.9JK SOPN INJECT 1.5 MG INTO THE SKIN ONCE A WEEK (Patient taking differently: Inject 1.5 mg into the skin every Friday. )  . furosemide (LASIX) 20 MG tablet Take 2 tablets (40 mg total) by mouth 2 (two) times daily.  . metoprolol tartrate (LOPRESSOR) 50 MG tablet Take 1 tablet by mouth twice daily (Patient taking differently: Take 50 mg by mouth 2 (two) times daily. )  . OXYGEN Inhale 2 L into the lungs continuous.  . pravastatin (PRAVACHOL) 20 MG tablet Take 1 tablet (20 mg total) by mouth daily.  . rivaroxaban (XARELTO) 20 MG TABS tablet TAKE  1 TABLET BY MOUTH ONCE DAILY WITH SUPPER (Patient taking differently: Take 20 mg by mouth daily with supper. )  . sitaGLIPtin (JANUVIA) 100 MG tablet Take 1 tablet (100 mg total) by mouth daily. (Patient taking differently: Take 100 mg by mouth daily with supper. )  . [DISCONTINUED] potassium chloride SA (KLOR-CON) 20 MEQ tablet Take 1 tablet (20 mEq total) by mouth daily.     Allergies:   Patient has no known allergies.   Social History   Tobacco Use  . Smoking status: Former Smoker    Packs/day: 1.00    Years: 43.00    Pack years: 43.00    Types: Cigarettes    Quit date: 03/08/2017    Years since quitting: 2.5  . Smokeless tobacco: Never Used  Substance Use Topics  . Alcohol use: No    Comment: Rare  . Drug use: No     Family Hx: The patient's family history includes Cancer in his brother; Cancer (age of onset: 6350) in his mother; Heart disease (age of onset: 3650) in his father; Stroke (age of onset: 445) in his brother.  ROS:   Please see the history of  present illness.     All other systems reviewed and are negative.   Prior CV studies:   The following studies were reviewed today:  Echo 08/29/2019 1. Left ventricular ejection fraction, by visual estimation, is 55%. The left ventricle has normal function. There is no left ventricular hypertrophy.  2. Left ventricular diastolic parameters are indeterminate.  3. The left ventricle has no regional wall motion abnormalities.  4. Global right ventricle has normal systolic function.The right ventricular size is mildly enlarged. No increase in right ventricular wall thickness.  5. Left atrial size was moderately dilated.  6. Right atrial size was mildly dilated.  7. Moderate mitral annular calcification.  8. The mitral valve is degenerative. Trace mitral valve regurgitation. No evidence of mitral stenosis.  9. The tricuspid valve is normal in structure. Tricuspid valve regurgitation is trivial. 10. The aortic valve is tricuspid. Aortic valve regurgitation is trivial. Mild to moderate aortic valve sclerosis/calcification without any evidence of aortic stenosis. 11. Peak RV-RA gradient 16 mmHg. 12. Technically difficult study with poor acoustic windows. RV not well-visualized. 13. The patient appeared to be in atrial fibrillation.  Labs/Other Tests and Data Reviewed:    EKG:  An ECG dated 09/01/2019 was personally reviewed today and demonstrated:  Normal sinus rhythm with first-degree AV block, otherwise no significant ST-T wave changes.  Recent Labs: 08/28/2019: ALT 11; B Natriuretic Peptide 413.9; TSH 2.805 08/30/2019: Hemoglobin 12.1; Magnesium 2.3; Platelets 347 09/18/2019: BUN 52; Creat 2.11; Potassium 4.6; Sodium 135   Recent Lipid Panel Lab Results  Component Value Date/Time   CHOL 132 12/05/2018 08:23 AM   TRIG 98 12/05/2018 08:23 AM   HDL 34 (L) 12/05/2018 08:23 AM   CHOLHDL 3.9 12/05/2018 08:23 AM   LDLCALC 80 12/05/2018 08:23 AM   LDLDIRECT 38 07/27/2019 12:38 PM    Wt  Readings from Last 3 Encounters:  09/21/19 (!) 339 lb (153.8 kg)  09/18/19 (!) 342 lb (155.1 kg)  09/12/19 (!) 334 lb (151.5 kg)     Objective:    Vital Signs:  There were no vitals taken for this visit.   No vital sign was available during today's visit.  ASSESSMENT & PLAN:    1. Chronic diastolic heart failure:  -After patient was discharged from the hospital, he did gain some the weight back.  Renal function also worsened during the same time.  Since switching back to Lasix, he has noted increased urinary output and weight loss.  His breathing is stable at this point.  I plan to see the patient back in 3 to 4 weeks in the clinic to reassess his volume status.  2. PAF: Continue Xarelto.  Currently on both rate control with metoprolol and rhythm control on amiodarone  3. Hypertension: Continue on metoprolol  4. DM2: Managed by primary care provider  5. Obstructive sleep apnea: Intolerant of CPAP therapy  6. History of PE: Diagnosed in 2018 on CT image.  Patient is on Xarelto  7. Nonsustained VT: Started on amiodarone during recent hospitalization due to nonsustained VT and PAF  8. Acute on chronic renal insufficiency: Since discharge he had worsening renal function.  However renal function improved after he was switched back to Lasix from torsemide.  Urinary output has also noticeably better back on Lasix.  9. Hyperkalemia: Improved after switching back to Lasix.  COVID-19 Education: The signs and symptoms of COVID-19 were discussed with the patient and how to seek care for testing (follow up with PCP or arrange E-visit).  The importance of social distancing was discussed today.  Time:   Today, I have spent 16 minutes with the patient with telehealth technology discussing the above problems.     Medication Adjustments/Labs and Tests Ordered: Current medicines are reviewed at length with the patient today.  Concerns regarding medicines are outlined above.   Tests  Ordered: No orders of the defined types were placed in this encounter.   Medication Changes: No orders of the defined types were placed in this encounter.   Follow Up:  In Person in 3 week(s)  Signed, Azalee Course, [place]  09/23/2019 9:24 PM    [facility]

## 2019-09-21 NOTE — Patient Instructions (Signed)
Medication Instructions:  Your physician recommends that you continue on your current medications as directed. Please refer to the Current Medication list given to you today.  *If you need a refill on your cardiac medications before your next appointment, please call your pharmacy*   Follow-Up: At [facility], you and your health needs are our priority.  As part of our continuing mission to provide you with exceptional heart care, we have created designated Provider Care Teams.  These Care Teams include your primary Cardiologist (physician) and Advanced Practice Providers (APPs -  Physician Assistants and Nurse Practitioners) who all work together to provide you with the care you need, when you need it.  Your next appointment:   3 week(s)  The format for your next appointment:   In Person  Provider:   You may see Olga Millers, MD or Azalee Course, PA-C for weight check and EKG office visit.  Other Instructions Your physician recommends that you weigh, daily, at the same time every day, and in the same amount of clothing. Please record your daily weights on the handout provided and bring it to your next appointment.   Please restrict fluid consumption to 32-64 oz. Daily. Please review the following about salt restriction and a low sodium diet.   Low-Sodium Eating Plan Sodium, which is an element that makes up salt, helps you maintain a healthy balance of fluids in your body. Too much sodium can increase your blood pressure and cause fluid and waste to be held in your body. Your health care provider or dietitian may recommend following this plan if you have high blood pressure (hypertension), kidney disease, liver disease, or heart failure. Eating less sodium can help lower your blood pressure, reduce swelling, and protect your heart, liver, and kidneys. What are tips for following this plan? General guidelines  Most people on this plan should limit their sodium intake to 1,500-2,000 mg  (milligrams) of sodium each day. Reading food labels   The Nutrition Facts label lists the amount of sodium in one serving of the food. If you eat more than one serving, you must multiply the listed amount of sodium by the number of servings.  Choose foods with less than 140 mg of sodium per serving.  Avoid foods with 300 mg of sodium or more per serving. Shopping  Look for lower-sodium products, often labeled as "low-sodium" or "no salt added."  Always check the sodium content even if foods are labeled as "unsalted" or "no salt added".  Buy fresh foods. ? Avoid canned foods and premade or frozen meals. ? Avoid canned, cured, or processed meats  Buy breads that have less than 80 mg of sodium per slice. Cooking  Eat more home-cooked food and less restaurant, buffet, and fast food.  Avoid adding salt when cooking. Use salt-free seasonings or herbs instead of table salt or sea salt. Check with your health care provider or pharmacist before using salt substitutes.  Cook with plant-based oils, such as canola, sunflower, or olive oil. Meal planning  When eating at a restaurant, ask that your food be prepared with less salt or no salt, if possible.  Avoid foods that contain MSG (monosodium glutamate). MSG is sometimes added to [place] food, bouillon, and some canned foods. What foods are recommended? The items listed may not be a complete list. Talk with your dietitian about what dietary choices are best for you. Grains Low-sodium cereals, including oats, puffed wheat and rice, and shredded wheat. Low-sodium crackers. Unsalted rice. Unsalted  pasta. Low-sodium bread. Whole-grain breads and whole-grain pasta. Vegetables Fresh or frozen vegetables. "No salt added" canned vegetables. "No salt added" tomato sauce and paste. Low-sodium or reduced-sodium tomato and vegetable juice. Fruits Fresh, frozen, or canned fruit. Fruit juice. Meats and other protein foods Fresh or frozen (no salt  added) meat, poultry, seafood, and fish. Low-sodium canned tuna and salmon. Unsalted nuts. Dried peas, beans, and lentils without added salt. Unsalted canned beans. Eggs. Unsalted nut butters. Dairy Milk. Soy milk. Cheese that is naturally low in sodium, such as ricotta cheese, fresh mozzarella, or Swiss cheese Low-sodium or reduced-sodium cheese. Cream cheese. Yogurt. Fats and oils Unsalted butter. Unsalted margarine with no trans fat. Vegetable oils such as canola or olive oils. Seasonings and other foods Fresh and dried herbs and spices. Salt-free seasonings. Low-sodium mustard and ketchup. Sodium-free salad dressing. Sodium-free light mayonnaise. Fresh or refrigerated horseradish. Lemon juice. Vinegar. Homemade, reduced-sodium, or low-sodium soups. Unsalted popcorn and pretzels. Low-salt or salt-free chips. What foods are not recommended? The items listed may not be a complete list. Talk with your dietitian about what dietary choices are best for you. Grains Instant hot cereals. Bread stuffing, pancake, and biscuit mixes. Croutons. Seasoned rice or pasta mixes. Noodle soup cups. Boxed or frozen macaroni and cheese. Regular salted crackers. Self-rising flour. Vegetables Sauerkraut, pickled vegetables, and relishes. Olives. [place] fries. Onion rings. Regular canned vegetables (not low-sodium or reduced-sodium). Regular canned tomato sauce and paste (not low-sodium or reduced-sodium). Regular tomato and vegetable juice (not low-sodium or reduced-sodium). Frozen vegetables in sauces. Meats and other protein foods Meat or fish that is salted, canned, smoked, spiced, or pickled. Bacon, ham, sausage, hotdogs, corned beef, chipped beef, packaged lunch meats, salt pork, jerky, pickled herring, anchovies, regular canned tuna, sardines, salted nuts. Dairy Processed cheese and cheese spreads. Cheese curds. Blue cheese. Feta cheese. String cheese. Regular cottage cheese. Buttermilk. Canned milk. Fats and  oils Salted butter. Regular margarine. Ghee. Bacon fat. Seasonings and other foods Onion salt, garlic salt, seasoned salt, table salt, and sea salt. Canned and packaged gravies. Worcestershire sauce. Tartar sauce. Barbecue sauce. Teriyaki sauce. Soy sauce, including reduced-sodium. Steak sauce. Fish sauce. Oyster sauce. Cocktail sauce. Horseradish that you find on the shelf. Regular ketchup and mustard. Meat flavorings and tenderizers. Bouillon cubes. Hot sauce and Tabasco sauce. Premade or packaged marinades. Premade or packaged taco seasonings. Relishes. Regular salad dressings. Salsa. Potato and tortilla chips. Corn chips and puffs. Salted popcorn and pretzels. Canned or dried soups. Pizza. Frozen entrees and pot pies. Summary  Eating less sodium can help lower your blood pressure, reduce swelling, and protect your heart, liver, and kidneys.  Most people on this plan should limit their sodium intake to 1,500-2,000 mg (milligrams) of sodium each day.  Canned, boxed, and frozen foods are high in sodium. Restaurant foods, fast foods, and pizza are also very high in sodium. You also get sodium by adding salt to food.  Try to cook at home, eat more fresh fruits and vegetables, and eat less fast food, canned, processed, or prepared foods. This information is not intended to replace advice given to you by your health care provider. Make sure you discuss any questions you have with your health care provider. Document Revised: 08/20/2017 Document Reviewed: 08/31/2016 Elsevier Patient Education  2020 [employer].

## 2019-09-21 NOTE — Progress Notes (Signed)
Subjective:    Patient ID: William Alvarado, male    DOB: 02/11/1951, 68 y.o.   MRN: [id number]  HPI 09/12/19 Patient is here today for hospital discharge follow-up.  Was recently admitted to the hospital with shortness of breath due to congestive heart failure.  He had been seen previously as an outpatient but was unsuccessful in diuresis on oral diuretics.  Patient was switched to IV Lasix and diuresed approximately 6 L at which point his breathing improved and he was discharged home.  Patient was discharged home on Lasix, 20 mg tablets, 2 tablets twice daily.  Therefore he is taking 40 mg twice daily.  Recently he had lab work obtained which showed that his creatinine had risen from 1.5 to greater than 2.0 suggesting possible dehydration.  However due to swelling in his legs and swelling in his abdomen and ineffective diuresis at home, his Lasix dose was recently increased to 60 mg in the morning and 40 mg in the evening.  Due to an elevated potassium, however the patient's potassium supplement was discontinued.  Here today, the patient's lungs are clear to auscultation bilaterally except for a few isolated expiratory wheezes.  He has a large protuberant firm abdomen.  However there is only trace edema in his legs due to compression wraps.  There is however significant venous stasis ulcers and weeping sores per tickly on his right anterior shin.  Please see the photograph below:   The great toenail on the patient's right foot also recently was removed after suffering an injury.  A blister due to edema in the right foot developed circumferentially around the proximal portion of the right great toe and has since ruptured.  There is foul-smelling drainage coming from this area.  The drainage is serous in nature.  However the patient's toe definitely admits a foul odor from both the nailbed and from the proximal portion.  The toe is purple in color as is the rest of his foot.  There is no evidence of  ischemia or necrosis however the toe is cool to the touch.  Recent TBI in the right foot was normal in September suggesting this discoloration is due to venous congestion rather than arterial insufficiency.  At that time, my plan was: Patient's renal function has been trending upward after diuresis.  Today he does not appear fluid overloaded on exam.  His lungs sound clear.  Therefore I will recheck a BMP and if his creatinine continues to trend upward I would likely reduce his Lasix back to 40 mg twice daily.  Regarding the venous insufficiency and venous stasis ulcers in his legs, his right leg has numerous areas of skin breakdown as shown in the photograph above.  Each of these areas was covered with Silvadene and nonadherent gauze and then placed in an [employer].  The left leg shows no skin breakdown.  It was simply placed in an [employer].  I have recommended that they change the Unna boot on Friday and I will see the patient back on Monday for reassessment.  TBI's in September showed normal perfusion to the toe.  Therefore I do not feel that the foul odor is due to arterial insufficiency and wet gangrene.  Instead I feel that the patient has chronic venous congestion causing skin breakdown and weeping sores to develop on his toe which provide a foul odor when wrapped in gauze.  There is no erythema or evidence of infection.  Therefore I recommended that they cover  the wound on the proximal toe with Neosporin and wrap with gauze daily and keep the dressings changed.  I will reassess this on Monday as well.  09/18/19 Creatinine had risen substantially from 2-2.73.  Therefore I recommend reducing Lasix to 20 mg twice a day as the patient was appearing dehydrated.  He is here today to recheck his legs as well as his renal function.  The wounds on his anterior right shin and posterior right calf are essentially unchanged although the edema is much better.  The edema in his left leg has improved dramatically  and there are only trace small ulcers on the lower anterior left shin.  However the patient has gained significant weight since his visit when I decreased his Lasix. Wt Readings from Last 3 Encounters:  09/21/19 (!) 339 lb (153.8 kg)  09/18/19 (!) 342 lb (155.1 kg)  09/12/19 (!) 334 lb (151.5 kg)   He has gained 8 pounds.  He also reports worsening dyspnea on exertion.  Patient appears fluid overloaded with increasing abdominal girth.  Therefore I feel that he needs diuresis despite his worsening renal function.  Also his left great toe has a expanding foul-smelling ulcer and area of desquamation proximal to the toenail/nailbed.  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  The odor is quite foul and smells like necrosis.  Despite his normal TBI's this appears to be ischemic.  Therefore I have recommended that he follow-up with vascular surgery as I feel the toe may require amputation.  At that time, my plan was: Unna boots were reapplied to both legs.  We will recheck the patient on Thursday to change his Unna boots at that time.  Venous stasis ulcers particular on the right leg are essentially unchanged.  There is no evidence of secondary cellulitis on either leg.  Patient appears fluid overloaded and has gained 8 pounds since reducing his Lasix.  Therefore despite his acute renal failure with worsening creatinine I feel we have no choice.  I feel the patient would likely wind up back in the hospital with pulmonary edema.  Therefore we will increase his Lasix back to 40 mg twice daily and he will use Zaroxolyn in the morning tomorrow prior to Lasix and also in the morning Wednesday prior to Lasix.  I will recheck the patient on Thursday and repeat his renal function at that time.  We will likely hold Zaroxolyn at that point and see if he can tolerate just the 40 mg twice daily of Lasix from them moving forward.  My biggest concern is also the foul-smelling ulcer on the anterior surface of his left great toe that appears  to be wet gangrene.  I will consult vascular surgery but I feel this toe likely requires amputation.  We will get a second opinion.        09/21/19  Patient is here today for a wound recheck.  Since I last saw the patient he has diuresed approximately 4 pounds.  His breathing is better although he still appears fluid overloaded despite using Zaroxolyn every morning the last 2 days and also increasing his Lasix to 40 mg twice daily.  He still states that he feels much better.  The Unna boot was removed from his left leg.  There is a small superficial ulcer on the anterolateral aspect of his left shin just above his ankle.  However the area shown above over the distal left great toe is essentially unchanged.  It is foul-smelling and nonhealing.  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  There is no evidence of any secondary infection however the toe has a very foul odor.  Patient has yet to hear from vascular surgery.  The Unna boot was removed from his right leg.  Patient accidentally sustained a 4 cm vertical laceration while the Unna boot was removed there was bleeding significantly.  This was closed immediately with three 3-0 Ethilon sutures.  However the superficial ulcer shown above have improved dramatically on the right leg and are healing well.  The patient was then placed back in Unna boots bilaterally.  Past Medical History:  Diagnosis Date  . Acute respiratory failure with hypoxia (HCC)    Hospitalized with acute respiratory failure secondary to CAP, diastolic CHF, and pulmonary embolism  . AKI (acute kidney injury) (HCC)   . Allergy    allergic rhinitis  . Altered mental status   . Atrial fibrillation (HCC)   . Chronic anticoagulation 03/26/2017   Failed Coumadin (PE with therapeutic INR). Now on Xarelto  . Colon polyps   . Community acquired pneumonia of right lower lobe of lung   . COPD (chronic obstructive pulmonary disease) (HCC)   . Degenerative  disc disease, lumbar   . Diabetes mellitus without complication (HCC)   . History of pulmonary embolus (PE) 03/26/2017  . Hypertension   . Mitral regurgitation 12/26/2012  . Obesity (BMI 30-39.9) 12/09/2012  . PAF (paroxysmal atrial fibrillation) (HCC)    DCCV in 2014 with recurrent PAF in setting of respiratory failure June 2018. S/P DCCV 04/13/17 to NSR  . Persistent atrial fibrillation (HCC)   . Pneumonia ~ 2016  . Sepsis (HCC) 03/10/2017  . Sleep apnea   . Systolic murmur   . Type II diabetes mellitus (HCC)    Past Surgical History:  Procedure Laterality Date  . CARDIOVERSION N/A 01/09/2013   Procedure: CARDIOVERSION;  Surgeon: Pricilla Riffle, MD;  Location: [facility] ENDOSCOPY;  Service: Cardiovascular;  Laterality: N/A;  . CARDIOVERSION N/A 04/13/2017   Procedure: CARDIOVERSION;  Surgeon: Quintella Reichert, MD;  Location: [facility] ENDOSCOPY;  Service: Cardiovascular;  Laterality: N/A;  . CARDIOVERSION N/A 09/01/2019   Procedure: CARDIOVERSION;  Surgeon: Jake Bathe, MD;  Location: [facility] ENDOSCOPY;  Service: Cardiovascular;  Laterality: N/A;  . TEE WITHOUT CARDIOVERSION N/A 01/09/2013   Procedure: TRANSESOPHAGEAL ECHOCARDIOGRAM (TEE);  Surgeon: Pricilla Riffle, MD;  Location: [facility] ENDOSCOPY;  Service: Cardiovascular;  Laterality: N/A;   Current Outpatient Medications on File Prior to Visit  Medication Sig Dispense Refill  . acetaminophen (TYLENOL) 500 MG tablet Take 1,000 mg by mouth 2 (two) times daily as needed (pain).    Marland Kitchen amiodarone (PACERONE) 200 MG tablet Take 1 tablet (200 mg total) by mouth daily. Start after finished the 400 mg tabs, in 2 weeks. 30 tablet 5  . Dulaglutide (TRULICITY) 1.5 MG/0.5ML SOPN INJECT 1.5 MG INTO THE SKIN ONCE A WEEK (Patient taking differently: Inject 1.5 mg into the skin every Friday. ) 4 pen 3  . furosemide (LASIX) 20 MG tablet Take 2 tablets (40 mg total) by mouth 2 (two) times daily. 90 tablet 3  . metoprolol tartrate (LOPRESSOR) 50 MG tablet Take 1 tablet by mouth twice  daily (Patient taking differently: Take 50 mg by mouth 2 (two) times daily. ) 180 tablet 0  . OXYGEN Inhale 2 L into the lungs continuous.    . pravastatin (PRAVACHOL) 20 MG tablet Take 1 tablet (20 mg total) by mouth daily. 90 tablet  3  . rivaroxaban (XARELTO) 20 MG TABS tablet TAKE 1 TABLET BY MOUTH ONCE DAILY WITH SUPPER (Patient taking differently: Take 20 mg by mouth daily with supper. ) 90 tablet 3  . sitaGLIPtin (JANUVIA) 100 MG tablet Take 1 tablet (100 mg total) by mouth daily. (Patient taking differently: Take 100 mg by mouth daily with supper. ) 90 tablet 2   No current facility-administered medications on file prior to visit.   No Known Allergies Social History   Socioeconomic History  . Marital status: Married    Spouse name: Not on file  . Number of children: 2  . Years of education: Not on file  . Highest education level: Not on file  Occupational History    Comment: Machinist  Tobacco Use  . Smoking status: Former Smoker    Packs/day: 1.00    Years: 43.00    Pack years: 43.00    Types: Cigarettes    Quit date: 03/08/2017    Years since quitting: 2.5  . Smokeless tobacco: Never Used  Substance and Sexual Activity  . Alcohol use: No    Comment: Rare  . Drug use: No  . Sexual activity: Not Currently  Other Topics Concern  . Not on file  Social History Narrative  . Not on file   Social Determinants of Health   Financial Resource Strain:   . Difficulty of Paying Living Expenses: Not on file  Food Insecurity: No Food Insecurity  . Worried About Charity fundraiser in the Last Year: Never true  . Ran Out of Food in the Last Year: Never true  Transportation Needs: No Transportation Needs  . Lack of Transportation (Medical): No  . Lack of Transportation (Non-Medical): No  Physical Activity:   . Days of Exercise per Week: Not on file  . Minutes of Exercise per Session: Not on file  Stress:   . Feeling of Stress : Not on file  Social Connections:   .  Frequency of Communication with Friends and Family: Not on file  . Frequency of Social Gatherings with Friends and Family: Not on file  . Attends Religious Services: Not on file  . Active Member of Clubs or Organizations: Not on file  . Attends Archivist Meetings: Not on file  . Marital Status: Not on file  Intimate Partner Violence:   . Fear of Current or Ex-Partner: Not on file  . Emotionally Abused: Not on file  . Physically Abused: Not on file  . Sexually Abused: Not on file     Review of Systems  All other systems reviewed and are negative.      Objective:   Physical Exam Vitals reviewed.  Constitutional:      General: He is not in acute distress.    Appearance: He is morbidly obese. He is not toxic-appearing or diaphoretic.  Cardiovascular:     Rate and Rhythm: Normal rate and regular rhythm.     Heart sounds: Normal heart sounds.  Pulmonary:     Effort: Pulmonary effort is normal. No respiratory distress.     Breath sounds: Wheezing present. No rales.  Abdominal:     General: There is distension.     Palpations: Abdomen is soft.     Tenderness: There is no abdominal tenderness. There is no guarding or rebound.  Musculoskeletal:        General: Swelling present.     Right lower leg: Edema present.     Left lower leg: Edema present.  Skin:    Findings: Lesion present.  Neurological:     Mental Status: He is alert.           Assessment & Plan:  Chronic venous insufficiency  Chronic combined systolic and diastolic congestive heart failure (HCC)  Venous stasis ulcer of calf, unspecified laterality, unspecified ulcer stage, unspecified whether varicose veins present (HCC)  Diabetic wet gangrene of the foot (HCC)  On a positive note, the patient feels better after losing 4 pounds.  I will continue the patient on Lasix 40 mg twice daily and see him back on Monday.  At that time I will recheck his renal function to see how he is maintaining on the  higher dose of diuretic.  Also on a positive note, the venous stasis ulcers on his anterior right shin have improved dramatically.  Unna boots were placed on the left leg and the right leg today and I anticipate continued improvement.  Patient accidentally sustained a laceration of his right leg that was closed with 3 sutures while removing the Unna boot to control bleeding.  This was done using sterile technique.  The area was anesthetized with 0.1% lidocaine with epinephrine.  It was cleaned thoroughly with Betadine.  3 simple interrupted 3-0 Ethilon sutures were then placed to close the laceration.  Copious amounts of Silvadene were applied to the stitches and then covered with nonadherent gauze.  The leg was then placed in an [employer] boot.  I recommended seeing the patient back on Monday to remove the stitches if possible and to continue Unna boot dressing changes.  My biggest concern now is the nonhealing skin breakdown on his left great toe which appears to be wet gangrene.  Await vascular surgery consultation however I suspect that the patient will require amputation.  Recheck on Monday and recheck renal function at that time.

## 2019-09-23 DIAGNOSIS — I5042 Chronic combined systolic (congestive) and diastolic (congestive) heart failure: Secondary | ICD-10-CM | POA: Diagnosis not present

## 2019-09-23 DIAGNOSIS — R0902 Hypoxemia: Secondary | ICD-10-CM | POA: Diagnosis not present

## 2019-09-25 ENCOUNTER — Encounter: Payer: Self-pay | Admitting: Family Medicine

## 2019-09-25 ENCOUNTER — Telehealth: Payer: Self-pay | Admitting: Family Medicine

## 2019-09-25 ENCOUNTER — Other Ambulatory Visit: Payer: Self-pay

## 2019-09-25 ENCOUNTER — Ambulatory Visit (INDEPENDENT_AMBULATORY_CARE_PROVIDER_SITE_OTHER): Payer: Medicare HMO | Admitting: Family Medicine

## 2019-09-25 VITALS — BP 132/76 | HR 94 | Temp 96.9°F | Resp 20 | Ht 73.0 in | Wt 330.0 lb

## 2019-09-25 DIAGNOSIS — I5042 Chronic combined systolic (congestive) and diastolic (congestive) heart failure: Secondary | ICD-10-CM

## 2019-09-25 DIAGNOSIS — E1152 Type 2 diabetes mellitus with diabetic peripheral angiopathy with gangrene: Secondary | ICD-10-CM

## 2019-09-25 DIAGNOSIS — I872 Venous insufficiency (chronic) (peripheral): Secondary | ICD-10-CM | POA: Diagnosis not present

## 2019-09-25 DIAGNOSIS — I83002 Varicose veins of unspecified lower extremity with ulcer of calf: Secondary | ICD-10-CM

## 2019-09-25 DIAGNOSIS — L97209 Non-pressure chronic ulcer of unspecified calf with unspecified severity: Secondary | ICD-10-CM | POA: Diagnosis not present

## 2019-09-25 MED ORDER — CEPHALEXIN 500 MG PO CAPS: 500.0000 mg | ORAL_CAPSULE | Freq: Three times a day (TID) | ORAL | 0 refills | Status: DC

## 2019-09-25 NOTE — Telephone Encounter (Signed)
Pt wife called in, stressing his foot and noticed that he was bleeding from a pinpoint spot on his great left toe.  Advised her to hold pressure and wrap with Coban and elevate the foot to get to stop bleeding.  Check this after 15 minutes to ensure that it has stopped bleeding she not noticed any drainage otherwise.  Advised her that the bleeding did not stop and he should be taken to the emergency room he has significant vascular issues with his lower extremities.  She voiced understanding.

## 2019-09-25 NOTE — Progress Notes (Signed)
Subjective:    Patient ID: William Alvarado, male    DOB: 02/11/1951, 69 y.o.   MRN: [id number]  HPI 09/12/19 Patient is here today for hospital discharge follow-up.  Was recently admitted to the hospital with shortness of breath due to congestive heart failure.  He had been seen previously as an outpatient but was unsuccessful in diuresis on oral diuretics.  Patient was switched to IV Lasix and diuresed approximately 6 L at which point his breathing improved and he was discharged home.  Patient was discharged home on Lasix, 20 mg tablets, 2 tablets twice daily.  Therefore he is taking 40 mg twice daily.  Recently he had lab work obtained which showed that his creatinine had risen from 1.5 to greater than 2.0 suggesting possible dehydration.  However due to swelling in his legs and swelling in his abdomen and ineffective diuresis at home, his Lasix dose was recently increased to 60 mg in the morning and 40 mg in the evening.  Due to an elevated potassium, however the patient's potassium supplement was discontinued.  Here today, the patient's lungs are clear to auscultation bilaterally except for a few isolated expiratory wheezes.  He has a large protuberant firm abdomen.  However there is only trace edema in his legs due to compression wraps.  There is however significant venous stasis ulcers and weeping sores per tickly on his right anterior shin.  Please see the photograph below:   The great toenail on the patient's right foot also recently was removed after suffering an injury.  A blister due to edema in the right foot developed circumferentially around the proximal portion of the right great toe and has since ruptured.  There is foul-smelling drainage coming from this area.  The drainage is serous in nature.  However the patient's toe definitely admits a foul odor from both the nailbed and from the proximal portion.  The toe is purple in color as is the rest of his foot.  There is no evidence of  ischemia or necrosis however the toe is cool to the touch.  Recent TBI in the right foot was normal in September suggesting this discoloration is due to venous congestion rather than arterial insufficiency.  At that time, my plan was: Patient's renal function has been trending upward after diuresis.  Today he does not appear fluid overloaded on exam.  His lungs sound clear.  Therefore I will recheck a BMP and if his creatinine continues to trend upward I would likely reduce his Lasix back to 40 mg twice daily.  Regarding the venous insufficiency and venous stasis ulcers in his legs, his right leg has numerous areas of skin breakdown as shown in the photograph above.  Each of these areas was covered with Silvadene and nonadherent gauze and then placed in an [employer].  The left leg shows no skin breakdown.  It was simply placed in an [employer].  I have recommended that they change the Unna boot on Friday and I will see the patient back on Monday for reassessment.  TBI's in September showed normal perfusion to the toe.  Therefore I do not feel that the foul odor is due to arterial insufficiency and wet gangrene.  Instead I feel that the patient has chronic venous congestion causing skin breakdown and weeping sores to develop on his toe which provide a foul odor when wrapped in gauze.  There is no erythema or evidence of infection.  Therefore I recommended that they cover  the wound on the proximal toe with Neosporin and wrap with gauze daily and keep the dressings changed.  I will reassess this on Monday as well.  09/18/19 Creatinine had risen substantially from 2-2.73.  Therefore I recommend reducing Lasix to 20 mg twice a day as the patient was appearing dehydrated.  He is here today to recheck his legs as well as his renal function.  The wounds on his anterior right shin and posterior right calf are essentially unchanged although the edema is much better.  The edema in his left leg has improved dramatically  and there are only trace small ulcers on the lower anterior left shin.  However the patient has gained significant weight since his visit when I decreased his Lasix. Wt Readings from Last 3 Encounters:  09/25/19 (!) 330 lb (149.7 kg)  09/21/19 (!) 339 lb (153.8 kg)  09/18/19 (!) 342 lb (155.1 kg)   He has gained 8 pounds.  He also reports worsening dyspnea on exertion.  Patient appears fluid overloaded with increasing abdominal girth.  Therefore I feel that he needs diuresis despite his worsening renal function.  Also his left great toe has a expanding foul-smelling ulcer and area of desquamation proximal to the toenail/nailbed.  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  The odor is quite foul and smells like necrosis.  Despite his normal TBI's this appears to be ischemic.  Therefore I have recommended that he follow-up with vascular surgery as I feel the toe may require amputation.  At that time, my plan was: Unna boots were reapplied to both legs.  We will recheck the patient on Thursday to change his Unna boots at that time.  Venous stasis ulcers particular on the right leg are essentially unchanged.  There is no evidence of secondary cellulitis on either leg.  Patient appears fluid overloaded and has gained 8 pounds since reducing his Lasix.  Therefore despite his acute renal failure with worsening creatinine I feel we have no choice.  I feel the patient would likely wind up back in the hospital with pulmonary edema.  Therefore we will increase his Lasix back to 40 mg twice daily and he will use Zaroxolyn in the morning tomorrow prior to Lasix and also in the morning Wednesday prior to Lasix.  I will recheck the patient on Thursday and repeat his renal function at that time.  We will likely hold Zaroxolyn at that point and see if he can tolerate just the 40 mg twice daily of Lasix from them moving forward.  My biggest concern is also the foul-smelling ulcer on the anterior surface of his left great toe that appears  to be wet gangrene.  I will consult vascular surgery but I feel this toe likely requires amputation.  We will get a second opinion.        09/21/19  Patient is here today for a wound recheck.  Since I last saw the patient he has diuresed approximately 4 pounds.  His breathing is better although he still appears fluid overloaded despite using Zaroxolyn every morning the last 2 days and also increasing his Lasix to 40 mg twice daily.  He still states that he feels much better.  The Unna boot was removed from his left leg.  There is a small superficial ulcer on the anterolateral aspect of his left shin just above his ankle.  However the area shown above over the distal left great toe is essentially unchanged.  It is foul-smelling and nonhealing.  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  There is no evidence of any secondary infection however the toe has a very foul odor.  Patient has yet to hear from vascular surgery.  The Unna boot was removed from his right leg.  Patient accidentally sustained a 4 cm vertical laceration while the Unna boot was removed there was bleeding significantly.  This was closed immediately with three 3-0 Ethilon sutures.  However the superficial ulcer shown above have improved dramatically on the right leg and are healing well.  The patient was then placed back in Unna boots bilaterally.  At that time, my plan was: On a positive note, the patient feels better after losing 4 pounds.  I will continue the patient on Lasix 40 mg twice daily and see him back on Monday.  At that time I will recheck his renal function to see how he is maintaining on the higher dose of diuretic.  Also on a positive note, the venous stasis ulcers on his anterior right shin have improved dramatically.  Unna boots were placed on the left leg and the right leg today and I anticipate continued improvement.  Patient accidentally sustained a laceration of his right leg that was closed  with 3 sutures while removing the Unna boot to control bleeding.  This was done using sterile technique.  The area was anesthetized with 0.1% lidocaine with epinephrine.  It was cleaned thoroughly with Betadine.  3 simple interrupted 3-0 Ethilon sutures were then placed to close the laceration.  Copious amounts of Silvadene were applied to the stitches and then covered with nonadherent gauze.  The leg was then placed in an [employer].  I recommended seeing the patient back on Monday to remove the stitches if possible and to continue Unna boot dressing changes.  My biggest concern now is the nonhealing skin breakdown on his left great toe which appears to be wet gangrene.  Await vascular surgery consultation however I suspect that the patient will require amputation.  Recheck on Monday and recheck renal function at that time.  09/25/19       Though wet gangrene on the dorsal surface of the left great toe is actually shrinking in size.  There is less drainage today.  There is no evidence of secondary cellulitis.  Please see photograph #1 above.  The numerous weeping venous stasis ulcers on the anterior surface of the right shin have all closed and look exceptionally well today.  The sutures along the anterior right shin that were placed at the last office visit are not able to be removed as the wound has not healed completely.  It is still oozing blood.  Please see photograph #2 above.  However the patient has a new bleeding ulcer on his posterior right calf as shown in photograph #3 above that is roughly the diameter of a quarter.  He also has some streaking erythema going up his lateral right leg just above his knee onto his right lateral thigh.  However the patient has lost 9 pounds since his last office visit and continues to diurese well on 40 mg of Lasix twice a day.  He states that his breathing is much better.  Past Medical History:  Diagnosis Date  . Acute respiratory failure with hypoxia (HCC)     Hospitalized with acute respiratory failure secondary to CAP, diastolic CHF, and pulmonary embolism  . AKI (acute kidney injury) (HCC)   . Allergy    allergic rhinitis  .  Altered mental status   . Atrial fibrillation ([place])   . Chronic anticoagulation 03/26/2017   Failed Coumadin (PE with therapeutic INR). Now on Xarelto  . Colon polyps   . Community acquired pneumonia of right lower lobe of lung   . COPD (chronic obstructive pulmonary disease) ([place])   . Degenerative disc disease, lumbar   . Diabetes mellitus without complication ([place])   . History of pulmonary embolus (PE) 03/26/2017  . Hypertension   . Mitral regurgitation 12/26/2012  . Obesity (BMI 30-39.9) 12/09/2012  . PAF (paroxysmal atrial fibrillation) ([place])    DCCV in 2014 with recurrent PAF in setting of respiratory failure June 2018. S/P DCCV 04/13/17 to NSR  . Persistent atrial fibrillation ([place])   . Pneumonia ~ 2016  . Sepsis ([place]) 03/10/2017  . Sleep apnea   . Systolic murmur   . Type II diabetes mellitus ([place])    Past Surgical History:  Procedure Laterality Date  . CARDIOVERSION N/A 01/09/2013   Procedure: CARDIOVERSION;  Surgeon: Fay Records, MD;  Location: [place];  Service: Cardiovascular;  Laterality: N/A;  . CARDIOVERSION N/A 04/13/2017   Procedure: CARDIOVERSION;  Surgeon: Sueanne Margarita, MD;  Location[facility] ENDOSCOPY;  Service: Cardiovascular;  Laterality: N/A;  . CARDIOVERSION N/A 09/01/2019   Procedure: CARDIOVERSION;  Surgeon: Jerline Pain, MD;  Location: [facility] ENDOSCOPY;  Service: Cardiovascular;  Laterality: N/A;  . TEE WITHOUT CARDIOVERSION N/A 01/09/2013   Procedure: TRANSESOPHAGEAL ECHOCARDIOGRAM (TEE);  Surgeon: Fay Records, MD;  Location: [facility] ENDOSCOPY;  Service: Cardiovascular;  Laterality: N/A;   Current Outpatient Medications on File Prior to Visit  Medication Sig Dispense Refill  . acetaminophen (TYLENOL) 500 MG tablet Take 1,000 mg by mouth 2 (two) times daily as needed (pain).    Marland Kitchen amiodarone  (PACERONE) 200 MG tablet Take 1 tablet (200 mg total) by mouth daily. Start after finished the 400 mg tabs, in 2 weeks. 30 tablet 5  . Dulaglutide (TRULICITY) 1.5 RD/4.0CX SOPN INJECT 1.5 MG INTO THE SKIN ONCE A WEEK (Patient taking differently: Inject 1.5 mg into the skin every Friday. ) 4 pen 3  . furosemide (LASIX) 20 MG tablet Take 2 tablets (40 mg total) by mouth 2 (two) times daily. 90 tablet 3  . metoprolol tartrate (LOPRESSOR) 50 MG tablet Take 1 tablet by mouth twice daily (Patient taking differently: Take 50 mg by mouth 2 (two) times daily. ) 180 tablet 0  . OXYGEN Inhale 2 L into the lungs continuous.    . pravastatin (PRAVACHOL) 20 MG tablet Take 1 tablet (20 mg total) by mouth daily. 90 tablet 3  . rivaroxaban (XARELTO) 20 MG TABS tablet TAKE 1 TABLET BY MOUTH ONCE DAILY WITH SUPPER (Patient taking differently: Take 20 mg by mouth daily with supper. ) 90 tablet 3  . sitaGLIPtin (JANUVIA) 100 MG tablet Take 1 tablet (100 mg total) by mouth daily. (Patient taking differently: Take 100 mg by mouth daily with supper. ) 90 tablet 2   No current facility-administered medications on file prior to visit.   No Known Allergies Social History   Socioeconomic History  . Marital status: Married    Spouse name: Not on file  . Number of children: 2  . Years of education: Not on file  . Highest education level: Not on file  Occupational History    Comment: Machinist  Tobacco Use  . Smoking status: Former Smoker    Packs/day: 1.00    Years: 43.00    Pack years: 43.00  Types: Cigarettes    Quit date: 03/08/2017    Years since quitting: 2.5  . Smokeless tobacco: Never Used  Substance and Sexual Activity  . Alcohol use: No    Comment: Rare  . Drug use: No  . Sexual activity: Not Currently  Other Topics Concern  . Not on file  Social History Narrative  . Not on file   Social Determinants of Health   Financial Resource Strain:   . Difficulty of Paying Living Expenses: Not on file    Food Insecurity: No Food Insecurity  . Worried About Programme researcher, broadcasting/film/video in the Last Year: Never true  . Ran Out of Food in the Last Year: Never true  Transportation Needs: No Transportation Needs  . Lack of Transportation (Medical): No  . Lack of Transportation (Non-Medical): No  Physical Activity:   . Days of Exercise per Week: Not on file  . Minutes of Exercise per Session: Not on file  Stress:   . Feeling of Stress : Not on file  Social Connections:   . Frequency of Communication with Friends and Family: Not on file  . Frequency of Social Gatherings with Friends and Family: Not on file  . Attends Religious Services: Not on file  . Active Member of Clubs or Organizations: Not on file  . Attends Banker Meetings: Not on file  . Marital Status: Not on file  Intimate Partner Violence:   . Fear of Current or Ex-Partner: Not on file  . Emotionally Abused: Not on file  . Physically Abused: Not on file  . Sexually Abused: Not on file     Review of Systems  All other systems reviewed and are negative.      Objective:   Physical Exam Vitals reviewed.  Constitutional:      General: He is not in acute distress.    Appearance: He is morbidly obese. He is not toxic-appearing or diaphoretic.  Cardiovascular:     Rate and Rhythm: Normal rate and regular rhythm.     Heart sounds: Normal heart sounds.  Pulmonary:     Effort: Pulmonary effort is normal. No respiratory distress.     Breath sounds: Wheezing present. No rales.  Abdominal:     General: There is distension.     Palpations: Abdomen is soft.     Tenderness: There is no abdominal tenderness. There is no guarding or rebound.  Musculoskeletal:        General: Swelling present.     Right lower leg: Edema present.     Left lower leg: Edema present.  Skin:    Findings: Lesion present.  Neurological:     Mental Status: He is alert.           Assessment & Plan:  Chronic venous insufficiency - Plan:  BASIC METABOLIC PANEL WITH GFR, cephALEXin (KEFLEX) 500 MG capsule  Diabetic wet gangrene of the foot (HCC) - Plan: BASIC METABOLIC PANEL WITH GFR, cephALEXin (KEFLEX) 500 MG capsule  Venous stasis ulcer of calf, unspecified laterality, unspecified ulcer stage, unspecified whether varicose veins present (HCC) - Plan: BASIC METABOLIC PANEL WITH GFR, cephALEXin (KEFLEX) 500 MG capsule  Chronic combined systolic and diastolic congestive heart failure (HCC) - Plan: BASIC METABOLIC PANEL WITH GFR, cephALEXin (KEFLEX) 500 MG capsule Patient appears to be diuresing well on 40 mg of Lasix twice daily.  The question is how his renal function is tolerating this dose.  I will check a BMP today and as long as  his renal function is stable we will maintain this dose as the patient appears to be diuresing well with it.  The wet gangrenous area on the dorsal surface of the left great toe is smaller in size today and looks better.  I placed nonadherent gauze between each of his toes and then wrapped the foot and [place] and an Unna boot dressing to help with some of the weeping serous edema coming from between the toes.  Nonadherent gauze covered in Silvadene was applied to the bleeding ulcer on the posterior right calf and the right leg was placed in an [employer] as well.  I am concerned about secondary cellulitis in the right leg and therefore I will start the patient on Keflex 500 mg 3 times daily for 7 days.  Recheck the patient on Thursday and if the wounds continue to improve I will remove the stitches on Thursday and transition the patient to home health dressing changes.

## 2019-09-26 ENCOUNTER — Other Ambulatory Visit: Payer: Self-pay

## 2019-09-26 DIAGNOSIS — L97209 Non-pressure chronic ulcer of unspecified calf with unspecified severity: Secondary | ICD-10-CM

## 2019-09-26 DIAGNOSIS — I872 Venous insufficiency (chronic) (peripheral): Secondary | ICD-10-CM

## 2019-09-26 DIAGNOSIS — E1152 Type 2 diabetes mellitus with diabetic peripheral angiopathy with gangrene: Secondary | ICD-10-CM

## 2019-09-26 DIAGNOSIS — I83002 Varicose veins of unspecified lower extremity with ulcer of calf: Secondary | ICD-10-CM

## 2019-09-26 DIAGNOSIS — I5042 Chronic combined systolic (congestive) and diastolic (congestive) heart failure: Secondary | ICD-10-CM

## 2019-09-26 LAB — BASIC METABOLIC PANEL WITH GFR
BUN/Creatinine Ratio: 23 (calc) — ABNORMAL HIGH (ref 6–22)
BUN: 46 mg/dL — ABNORMAL HIGH (ref 7–25)
CO2: 41 mmol/L — ABNORMAL HIGH (ref 20–32)
Calcium: 8.8 mg/dL (ref 8.6–10.3)
Chloride: 86 mmol/L — ABNORMAL LOW (ref 98–110)
Creat: 1.98 mg/dL — ABNORMAL HIGH (ref 0.70–1.25)
GFR, Est African American: 40 mL/min/{1.73_m2} — ABNORMAL LOW (ref >=60)
GFR, Est Non African American: 34 mL/min/{1.73_m2} — ABNORMAL LOW (ref >=60)
Glucose, Bld: 142 mg/dL — ABNORMAL HIGH (ref 65–99)
Potassium: 2.9 mmol/L — ABNORMAL LOW (ref 3.5–5.3)
Sodium: 138 mmol/L (ref 135–146)

## 2019-09-26 MED ORDER — CEPHALEXIN 500 MG PO CAPS: 500.0000 mg | ORAL_CAPSULE | Freq: Three times a day (TID) | ORAL | 0 refills | Status: DC

## 2019-09-27 ENCOUNTER — Ambulatory Visit (HOSPITAL_COMMUNITY)
Admission: RE | Admit: 2019-09-27 | Discharge: 2019-09-27 | Disposition: A | Discharge status: Home / Self Care | Payer: Medicare HMO | Source: Ambulatory Visit | Attending: Vascular Surgery | Admitting: Vascular Surgery

## 2019-09-27 ENCOUNTER — Other Ambulatory Visit: Payer: Self-pay

## 2019-09-27 ENCOUNTER — Encounter (HOSPITAL_COMMUNITY): Payer: Self-pay

## 2019-09-27 ENCOUNTER — Encounter: Payer: Self-pay | Admitting: Vascular Surgery

## 2019-09-27 ENCOUNTER — Ambulatory Visit (INDEPENDENT_AMBULATORY_CARE_PROVIDER_SITE_OTHER): Payer: Medicare HMO | Admitting: Vascular Surgery

## 2019-09-27 VITALS — BP 95/65 | HR 78 | Temp 98.2°F | Resp 20 | Ht 73.0 in | Wt 330.0 lb

## 2019-09-27 DIAGNOSIS — I872 Venous insufficiency (chronic) (peripheral): Secondary | ICD-10-CM

## 2019-09-27 DIAGNOSIS — I739 Peripheral vascular disease, unspecified: Secondary | ICD-10-CM | POA: Diagnosis not present

## 2019-09-27 DIAGNOSIS — I70249 Atherosclerosis of native arteries of left leg with ulceration of unspecified site: Secondary | ICD-10-CM | POA: Diagnosis not present

## 2019-09-27 NOTE — Progress Notes (Signed)
Patient name: William Alvarado MRN: [id number] DOB: 07-07-1951 Sex: male  REASON FOR VISIT:   Foul odor from left foot.  HPI:   William Alvarado is a pleasant 69 y.o. male who was previously seen by Dr. Sherald Hess with peripheral vascular disease on 06/20/2019 and then subsequently with venous insufficiency on 07/25/2019.  He has a complicated history.  He has multiple medical comorbidities including diabetes, atrial fibrillation on chronic anticoagulation, COPD on home oxygen, diastolic heart failure, and hypertension.  He has had chronic wounds in his legs and is being treated with Unna boots.  He also at his last visit was noted to have a left great toenail that was about ready to fall off after local trauma.  In November he was noted to have a an ABI in the right of 96% with triphasic signals.  On the left ABI was 100% with biphasic signals.  Toe pressure was 105 on the right and 115 on the left with pulsatile toe tracings.  Venous studies at that time showed no venous insufficiency on the right.  On the left side he had reflux in the common femoral vein and saphenous vein in the mid to distal thigh where the vein was dilated up to 5 mm.  On previous exams he had palpable dorsalis pedis pulses.  On my history the patient has been using Unna boots for months now and has chronic superficial ulcerations.  He developed symptoms drainage from the foot and a wound on the left great toe.  He was sent for vascular consultation.  His activity is very limited I do not get any history of claudication.  I do not get any history of rest pain.  He refused to have ABIs done today.  Past Medical History:  Diagnosis Date   Acute respiratory failure with hypoxia [facility])    Hospitalized with acute respiratory failure secondary to CAP, diastolic CHF, and pulmonary embolism   AKI (acute kidney injury) (HCC)    Allergy    allergic rhinitis   Altered mental status    Atrial fibrillation (HCC)     Chronic anticoagulation 03/26/2017   Failed Coumadin (PE with therapeutic INR). Now on Xarelto   Colon polyps    Community acquired pneumonia of right lower lobe of lung    COPD (chronic obstructive pulmonary disease) (HCC)    Degenerative disc disease, lumbar    Diabetes mellitus without complication (HCC)    History of pulmonary embolus (PE) 03/26/2017   Hypertension    Mitral regurgitation 12/26/2012   Obesity (BMI 30-39.9) 12/09/2012   PAF (paroxysmal atrial fibrillation) (HCC)    DCCV in 2014 with recurrent PAF in setting of respiratory failure June 2018. S/P DCCV 04/13/17 to NSR   Persistent atrial fibrillation (HCC)    Pneumonia ~ 2016   Sepsis (HCC) 03/10/2017   Sleep apnea    Systolic murmur    Type II diabetes mellitus (HCC)     Family History  Problem Relation Age of Onset   Cancer Mother 46       breast   Heart disease Father 31       MI   Cancer Brother        lung   Stroke Brother 70    SOCIAL HISTORY: Social History   Tobacco Use   Smoking status: Former Smoker    Packs/day: 1.00    Years: 43.00    Pack years: 43.00    Types: Cigarettes    Quit date:  03/08/2017    Years since quitting: 2.5   Smokeless tobacco: Never Used  Substance Use Topics   Alcohol use: No    Comment: Rare    No Known Allergies  Current Outpatient Medications  Medication Sig Dispense Refill   acetaminophen (TYLENOL) 500 MG tablet Take 1,000 mg by mouth 2 (two) times daily as needed (pain).     amiodarone (PACERONE) 200 MG tablet Take 1 tablet (200 mg total) by mouth daily. Start after finished the 400 mg tabs, in 2 weeks. 30 tablet 5   cephALEXin (KEFLEX) 500 MG capsule Take 1 capsule (500 mg total) by mouth 3 (three) times daily. 21 capsule 0   Dulaglutide (TRULICITY) 1.5 MG/0.5ML SOPN INJECT 1.5 MG INTO THE SKIN ONCE A WEEK (Patient taking differently: Inject 1.5 mg into the skin every Friday. ) 4 pen 3   furosemide (LASIX) 20 MG tablet Take 2 tablets (40  mg total) by mouth 2 (two) times daily. 90 tablet 3   metoprolol tartrate (LOPRESSOR) 50 MG tablet Take 1 tablet by mouth twice daily (Patient taking differently: Take 50 mg by mouth 2 (two) times daily. ) 180 tablet 0   OXYGEN Inhale 2 L into the lungs continuous.     pravastatin (PRAVACHOL) 20 MG tablet Take 1 tablet (20 mg total) by mouth daily. 90 tablet 3   rivaroxaban (XARELTO) 20 MG TABS tablet TAKE 1 TABLET BY MOUTH ONCE DAILY WITH SUPPER (Patient taking differently: Take 20 mg by mouth daily with supper. ) 90 tablet 3   sitaGLIPtin (JANUVIA) 100 MG tablet Take 1 tablet (100 mg total) by mouth daily. (Patient taking differently: Take 100 mg by mouth daily with supper. ) 90 tablet 2   No current facility-administered medications for this visit.    REVIEW OF SYSTEMS:  [X]  denotes positive finding, [ ]  denotes negative finding Cardiac  Comments:  Chest pain or chest pressure:    Shortness of breath upon exertion: x   Short of breath when lying flat:    Irregular heart rhythm:        Vascular    Pain in calf, thigh, or hip brought on by ambulation:    Pain in feet at night that wakes you up from your sleep:     Blood clot in your veins:    Leg swelling:         Pulmonary    Oxygen at home: x   Productive cough:     Wheezing:  x       Neurologic    Sudden weakness in arms or legs:     Sudden numbness in arms or legs:     Sudden onset of difficulty speaking or slurred speech:    Temporary loss of vision in one eye:     Problems with dizziness:         Gastrointestinal    Blood in stool:     Vomited blood:         Genitourinary    Burning when urinating:     Blood in urine:        Psychiatric    Major depression:         Hematologic    Bleeding problems:    Problems with blood clotting too easily:        Skin    Rashes or ulcers:        Constitutional    Fever or chills:     PHYSICAL EXAM:   Vitals:  09/27/19 1307  BP: 95/65  Pulse: 78  Resp: 20    Temp: 98.2 F (36.8 C)  SpO2: 93%  Weight: (!) 330 lb (149.7 kg)  Height: 6\' 1"  (1.854 m)    GENERAL: The patient is a well-nourished male, in no acute distress. The vital signs are documented above. CARDIAC: There is a regular rate and rhythm.  VASCULAR: I cut the Unna boot away from his left foot.  I could not palpate a dorsalis pedis pulse however he has a biphasic dorsalis pedis signal at the signal consistent with prior exams. He has an odor to the foot and wounds involving all the toes as documented below.    PULMONARY: There is good air exchange bilaterally without wheezing or rales. PSYCHIATRIC: The patient has a normal affect.  DATA:    His previous arterial Doppler study in venous reflux study is documented above.  He refused to have a follow-up arterial Doppler study today.  MEDICAL ISSUES:   CHRONIC VENOUS INSUFFICIENCY: This patient does have chronic venous insufficiency of the left lower extremity.  Based on his Doppler studies and previous exam he should have adequate arterial flow to heal these wounds however I would favor not using an Unna boot because compression may compromise the circulation.  I have instructed him to soak the foot daily in lukewarm antimicrobial soap and to keep dry gauze between the toes.  We will have him follow-up in 2 months.  If the toes are not improving then we would have to consider arteriography although again he is previously had normal ABIs and toe pressures and a palpable dorsalis pedis pulse.  I think the most important issue is not to have a tight dressing on the foot.  I did review his previous venous study and he did not appear to be a good candidate for laser ablation of the left great saphenous vein.  He had no reflux at the saphenofemoral junction.  Certainly I would not consider anything until the wounds on the foot have healed.  Deitra Mayo [facility] [phone]

## 2019-09-27 NOTE — Patient Outreach (Signed)
  [facility]) Care Management Chronic Special Needs Program    09/27/2019  Name: William Alvarado, DOB: [birth year]  MRN: [id number]   William Alvarado was enrolled in a chronic special needs plan for Diabetes. Case closed as client has disenrolled from the plan Plan to send case closure letter to MD Dudley Major RN, Maximiano Coss, CDE Chronic Care Management Coordinator [facility] Care Management [phone]

## 2019-09-28 ENCOUNTER — Telehealth: Payer: Self-pay | Admitting: Cardiology

## 2019-09-28 ENCOUNTER — Ambulatory Visit (INDEPENDENT_AMBULATORY_CARE_PROVIDER_SITE_OTHER): Payer: Medicare HMO | Admitting: Family Medicine

## 2019-09-28 ENCOUNTER — Encounter: Payer: Self-pay | Admitting: Family Medicine

## 2019-09-28 VITALS — BP 100/70 | HR 56 | Temp 96.9°F | Resp 22 | Ht 73.0 in | Wt 328.0 lb

## 2019-09-28 DIAGNOSIS — I872 Venous insufficiency (chronic) (peripheral): Secondary | ICD-10-CM | POA: Diagnosis not present

## 2019-09-28 DIAGNOSIS — L97821 Non-pressure chronic ulcer of other part of left lower leg limited to breakdown of skin: Secondary | ICD-10-CM

## 2019-09-28 DIAGNOSIS — L97209 Non-pressure chronic ulcer of unspecified calf with unspecified severity: Secondary | ICD-10-CM

## 2019-09-28 DIAGNOSIS — I83002 Varicose veins of unspecified lower extremity with ulcer of calf: Secondary | ICD-10-CM

## 2019-09-28 DIAGNOSIS — I83028 Varicose veins of left lower extremity with ulcer other part of lower leg: Secondary | ICD-10-CM

## 2019-09-28 DIAGNOSIS — I5042 Chronic combined systolic (congestive) and diastolic (congestive) heart failure: Secondary | ICD-10-CM | POA: Diagnosis not present

## 2019-09-28 MED ORDER — POTASSIUM CHLORIDE CRYS ER 20 MEQ PO TBCR: 20.0000 meq | EXTENDED_RELEASE_TABLET | Freq: Every day | ORAL | 3 refills | Status: DC

## 2019-09-28 NOTE — Addendum Note (Signed)
Addended by: Legrand Rams B on: 09/28/2019 03:08 PM   Modules accepted: Orders

## 2019-09-28 NOTE — Progress Notes (Signed)
Subjective:    Patient ID: William Alvarado, male    DOB: 02/11/1951, 69 y.o.   MRN: [id number]  HPI 09/12/19 Patient is here today for hospital discharge follow-up.  Was recently admitted to the hospital with shortness of breath due to congestive heart failure.  He had been seen previously as an outpatient but was unsuccessful in diuresis on oral diuretics.  Patient was switched to IV Lasix and diuresed approximately 6 L at which point his breathing improved and he was discharged home.  Patient was discharged home on Lasix, 20 mg tablets, 2 tablets twice daily.  Therefore he is taking 40 mg twice daily.  Recently he had lab work obtained which showed that his creatinine had risen from 1.5 to greater than 2.0 suggesting possible dehydration.  However due to swelling in his legs and swelling in his abdomen and ineffective diuresis at home, his Lasix dose was recently increased to 60 mg in the morning and 40 mg in the evening.  Due to an elevated potassium, however the patient's potassium supplement was discontinued.  Here today, the patient's lungs are clear to auscultation bilaterally except for a few isolated expiratory wheezes.  He has a large protuberant firm abdomen.  However there is only trace edema in his legs due to compression wraps.  There is however significant venous stasis ulcers and weeping sores per tickly on his right anterior shin.  Please see the photograph below:   The great toenail on the patient's right foot also recently was removed after suffering an injury.  A blister due to edema in the right foot developed circumferentially around the proximal portion of the right great toe and has since ruptured.  There is foul-smelling drainage coming from this area.  The drainage is serous in nature.  However the patient's toe definitely admits a foul odor from both the nailbed and from the proximal portion.  The toe is purple in color as is the rest of his foot.  There is no evidence of  ischemia or necrosis however the toe is cool to the touch.  Recent TBI in the right foot was normal in September suggesting this discoloration is due to venous congestion rather than arterial insufficiency.  At that time, my plan was: Patient's renal function has been trending upward after diuresis.  Today he does not appear fluid overloaded on exam.  His lungs sound clear.  Therefore I will recheck a BMP and if his creatinine continues to trend upward I would likely reduce his Lasix back to 40 mg twice daily.  Regarding the venous insufficiency and venous stasis ulcers in his legs, his right leg has numerous areas of skin breakdown as shown in the photograph above.  Each of these areas was covered with Silvadene and nonadherent gauze and then placed in an [employer].  The left leg shows no skin breakdown.  It was simply placed in an [employer].  I have recommended that they change the Unna boot on Friday and I will see the patient back on Monday for reassessment.  TBI's in September showed normal perfusion to the toe.  Therefore I do not feel that the foul odor is due to arterial insufficiency and wet gangrene.  Instead I feel that the patient has chronic venous congestion causing skin breakdown and weeping sores to develop on his toe which provide a foul odor when wrapped in gauze.  There is no erythema or evidence of infection.  Therefore I recommended that they cover  the wound on the proximal toe with Neosporin and wrap with gauze daily and keep the dressings changed.  I will reassess this on Monday as well.  09/18/19 Creatinine had risen substantially from 2-2.73.  Therefore I recommend reducing Lasix to 20 mg twice a day as the patient was appearing dehydrated.  He is here today to recheck his legs as well as his renal function.  The wounds on his anterior right shin and posterior right calf are essentially unchanged although the edema is much better.  The edema in his left leg has improved dramatically  and there are only trace small ulcers on the lower anterior left shin.  However the patient has gained significant weight since his visit when I decreased his Lasix. Wt Readings from Last 3 Encounters:  09/27/19 (!) 330 lb (149.7 kg)  09/25/19 (!) 330 lb (149.7 kg)  09/21/19 (!) 339 lb (153.8 kg)   He has gained 8 pounds.  He also reports worsening dyspnea on exertion.  Patient appears fluid overloaded with increasing abdominal girth.  Therefore I feel that he needs diuresis despite his worsening renal function.  Also his left great toe has a expanding foul-smelling ulcer and area of desquamation proximal to the toenail/nailbed.  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  The odor is quite foul and smells like necrosis.  Despite his normal TBI's this appears to be ischemic.  Therefore I have recommended that he follow-up with vascular surgery as I feel the toe may require amputation.  At that time, my plan was: Unna boots were reapplied to both legs.  We will recheck the patient on Thursday to change his Unna boots at that time.  Venous stasis ulcers particular on the right leg are essentially unchanged.  There is no evidence of secondary cellulitis on either leg.  Patient appears fluid overloaded and has gained 8 pounds since reducing his Lasix.  Therefore despite his acute renal failure with worsening creatinine I feel we have no choice.  I feel the patient would likely wind up back in the hospital with pulmonary edema.  Therefore we will increase his Lasix back to 40 mg twice daily and he will use Zaroxolyn in the morning tomorrow prior to Lasix and also in the morning Wednesday prior to Lasix.  I will recheck the patient on Thursday and repeat his renal function at that time.  We will likely hold Zaroxolyn at that point and see if he can tolerate just the 40 mg twice daily of Lasix from them moving forward.  My biggest concern is also the foul-smelling ulcer on the anterior surface of his left great toe that appears  to be wet gangrene.  I will consult vascular surgery but I feel this toe likely requires amputation.  We will get a second opinion.        09/21/19  Patient is here today for a wound recheck.  Since I last saw the patient he has diuresed approximately 4 pounds.  His breathing is better although he still appears fluid overloaded despite using Zaroxolyn every morning the last 2 days and also increasing his Lasix to 40 mg twice daily.  He still states that he feels much better.  The Unna boot was removed from his left leg.  There is a small superficial ulcer on the anterolateral aspect of his left shin just above his ankle.  However the area shown above over the distal left great toe is essentially unchanged.  It is foul-smelling and nonhealing.  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  There is no evidence of any secondary infection however the toe has a very foul odor.  Patient has yet to hear from vascular surgery.  The Unna boot was removed from his right leg.  Patient accidentally sustained a 4 cm vertical laceration while the Unna boot was removed there was bleeding significantly.  This was closed immediately with three 3-0 Ethilon sutures.  However the superficial ulcer shown above have improved dramatically on the right leg and are healing well.  The patient was then placed back in Unna boots bilaterally.  At that time, my plan was: On a positive note, the patient feels better after losing 4 pounds.  I will continue the patient on Lasix 40 mg twice daily and see him back on Monday.  At that time I will recheck his renal function to see how he is maintaining on the higher dose of diuretic.  Also on a positive note, the venous stasis ulcers on his anterior right shin have improved dramatically.  Unna boots were placed on the left leg and the right leg today and I anticipate continued improvement.  Patient accidentally sustained a laceration of his right leg that was closed  with 3 sutures while removing the Unna boot to control bleeding.  This was done using sterile technique.  The area was anesthetized with 0.1% lidocaine with epinephrine.  It was cleaned thoroughly with Betadine.  3 simple interrupted 3-0 Ethilon sutures were then placed to close the laceration.  Copious amounts of Silvadene were applied to the stitches and then covered with nonadherent gauze.  The leg was then placed in an [employer].  I recommended seeing the patient back on Monday to remove the stitches if possible and to continue Unna boot dressing changes.  My biggest concern now is the nonhealing skin breakdown on his left great toe which appears to be wet gangrene.  Await vascular surgery consultation however I suspect that the patient will require amputation.  Recheck on Monday and recheck renal function at that time.  09/25/19       Though wet gangrene on the dorsal surface of the left great toe is actually shrinking in size.  There is less drainage today.  There is no evidence of secondary cellulitis.  Please see photograph #1 above.  The numerous weeping venous stasis ulcers on the anterior surface of the right shin have all closed and look exceptionally well today.  The sutures along the anterior right shin that were placed at the last office visit are not able to be removed as the wound has not healed completely.  It is still oozing blood.  Please see photograph #2 above.  However the patient has a new bleeding ulcer on his posterior right calf as shown in photograph #3 above that is roughly the diameter of a quarter.  He also has some streaking erythema going up his lateral right leg just above his knee onto his right lateral thigh.  However the patient has lost 9 pounds since his last office visit and continues to diurese well on 40 mg of Lasix twice a day.  He states that his breathing is much better.  At that time, my plan was: Patient appears to be diuresing well on 40 mg of Lasix twice  daily.  The question is how his renal function is tolerating this dose.  I will check a BMP today and as long as his renal function is stable  we will maintain this dose as the patient appears to be diuresing well with it.  The wet gangrenous area on the dorsal surface of the left great toe is smaller in size today and looks better.  I placed nonadherent gauze between each of his toes and then wrapped the foot and Coban and an Unna boot dressing to help with some of the weeping serous edema coming from between the toes.  Nonadherent gauze covered in Silvadene was applied to the bleeding ulcer on the posterior right calf and the right leg was placed in an [employer] as well.  I am concerned about secondary cellulitis in the right leg and therefore I will start the patient on Keflex 500 mg 3 times daily for 7 days.  Recheck the patient on Thursday and if the wounds continue to improve I will remove the stitches on Thursday and transition the patient to home health dressing changes.  09/28/19  Patient saw vascular surgery earlier this week who recommended wrapping his left foot more loosely to avoid ischemia to the toe where there is wet gangrene.  He also recommended soaking the toe frequently.  Patient today is in an Ace wrap on the foot.  The Ace wrap to soaked with serous drainage.  There is literally serous fluid weeping from the wounds on his posterior left leg and also from a bulla located above the compression wrap on his left leg.  This is dripping onto the floor as we remove the dressing.  Unna boot is removed from the right leg.  There are 3 areas of bleeding on anterior right shin, posterior right calf and lateral right shin where the skin l tears as the dressing is removed.  Each of these areas of bleeding is covered with Silvadene, nonadherent gauze and and the patient is placed back in an [employer].  Regarding his left leg, the left ankle is wrapped loosely in an Unna boot progressively tightening as we  had up the calf near the weeping venous stasis ulcers.  Coban is applied as well in the same fashion so that the ends of the toes are available to be soaked in warm soapy water as recommended by vascular surgery.  Most recent BMP showed improved renal function although he had hypokalemia. Past Medical History:  Diagnosis Date  . Acute respiratory failure with hypoxia (HCC)    Hospitalized with acute respiratory failure secondary to CAP, diastolic CHF, and pulmonary embolism  . AKI (acute kidney injury) ([place])   . Allergy    allergic rhinitis  . Altered mental status   . Atrial fibrillation ([place])   . Chronic anticoagulation 03/26/2017   Failed Coumadin (PE with therapeutic INR). Now on Xarelto  . Colon polyps   . Community acquired pneumonia of right lower lobe of lung   . COPD (chronic obstructive pulmonary disease) ([place])   . Degenerative disc disease, lumbar   . Diabetes mellitus without complication ([place])   . History of pulmonary embolus (PE) 03/26/2017  . Hypertension   . Mitral regurgitation 12/26/2012  . Obesity (BMI 30-39.9) 12/09/2012  . PAF (paroxysmal atrial fibrillation) ([place])    DCCV in 2014 with recurrent PAF in setting of respiratory failure June 2018. S/P DCCV 04/13/17 to NSR  . Persistent atrial fibrillation ([place])   . Pneumonia ~ 2016  . Sepsis ([place]) 03/10/2017  . Sleep apnea   . Systolic murmur   . Type II diabetes mellitus ([place])    Past Surgical History:  Procedure Laterality  Date  . CARDIOVERSION N/A 01/09/2013   Procedure: CARDIOVERSION;  Surgeon: Pricilla Riffle, MD;  Location: [facility] ENDOSCOPY;  Service: Cardiovascular;  Laterality: N/A;  . CARDIOVERSION N/A 04/13/2017   Procedure: CARDIOVERSION;  Surgeon: Quintella Reichert, MD;  Location: [facility] ENDOSCOPY;  Service: Cardiovascular;  Laterality: N/A;  . CARDIOVERSION N/A 09/01/2019   Procedure: CARDIOVERSION;  Surgeon: Jake Bathe, MD;  Location: [facility] ENDOSCOPY;  Service: Cardiovascular;  Laterality: N/A;  . TEE WITHOUT  CARDIOVERSION N/A 01/09/2013   Procedure: TRANSESOPHAGEAL ECHOCARDIOGRAM (TEE);  Surgeon: Pricilla Riffle, MD;  Location: [facility] ENDOSCOPY;  Service: Cardiovascular;  Laterality: N/A;   Current Outpatient Medications on File Prior to Visit  Medication Sig Dispense Refill  . acetaminophen (TYLENOL) 500 MG tablet Take 1,000 mg by mouth 2 (two) times daily as needed (pain).    Marland Kitchen amiodarone (PACERONE) 200 MG tablet Take 1 tablet (200 mg total) by mouth daily. Start after finished the 400 mg tabs, in 2 weeks. 30 tablet 5  . cephALEXin (KEFLEX) 500 MG capsule Take 1 capsule (500 mg total) by mouth 3 (three) times daily. 21 capsule 0  . Dulaglutide (TRULICITY) 1.5 MG/0.5ML SOPN INJECT 1.5 MG INTO THE SKIN ONCE A WEEK (Patient taking differently: Inject 1.5 mg into the skin every Friday. ) 4 pen 3  . furosemide (LASIX) 20 MG tablet Take 2 tablets (40 mg total) by mouth 2 (two) times daily. 90 tablet 3  . metoprolol tartrate (LOPRESSOR) 50 MG tablet Take 1 tablet by mouth twice daily (Patient taking differently: Take 50 mg by mouth 2 (two) times daily. ) 180 tablet 0  . OXYGEN Inhale 2 L into the lungs continuous.    . pravastatin (PRAVACHOL) 20 MG tablet Take 1 tablet (20 mg total) by mouth daily. 90 tablet 3  . rivaroxaban (XARELTO) 20 MG TABS tablet TAKE 1 TABLET BY MOUTH ONCE DAILY WITH SUPPER (Patient taking differently: Take 20 mg by mouth daily with supper. ) 90 tablet 3  . sitaGLIPtin (JANUVIA) 100 MG tablet Take 1 tablet (100 mg total) by mouth daily. (Patient taking differently: Take 100 mg by mouth daily with supper. ) 90 tablet 2   No current facility-administered medications on file prior to visit.   No Known Allergies Social History   Socioeconomic History  . Marital status: Married    Spouse name: Not on file  . Number of children: 2  . Years of education: Not on file  . Highest education level: Not on file  Occupational History    Comment: Machinist  Tobacco Use  . Smoking status: Former  Smoker    Packs/day: 1.00    Years: 43.00    Pack years: 43.00    Types: Cigarettes    Quit date: 03/08/2017    Years since quitting: 2.5  . Smokeless tobacco: Never Used  Substance and Sexual Activity  . Alcohol use: No    Comment: Rare  . Drug use: No  . Sexual activity: Not Currently  Other Topics Concern  . Not on file  Social History Narrative  . Not on file   Social Determinants of Health   Financial Resource Strain:   . Difficulty of Paying Living Expenses: Not on file  Food Insecurity: No Food Insecurity  . Worried About Programme researcher, broadcasting/film/video in the Last Year: Never true  . Ran Out of Food in the Last Year: Never true  Transportation Needs: No Transportation Needs  . Lack of Transportation (Medical): No  . Lack  of Transportation (Non-Medical): No  Physical Activity:   . Days of Exercise per Week: Not on file  . Minutes of Exercise per Session: Not on file  Stress:   . Feeling of Stress : Not on file  Social Connections:   . Frequency of Communication with Friends and Family: Not on file  . Frequency of Social Gatherings with Friends and Family: Not on file  . Attends Religious Services: Not on file  . Active Member of Clubs or Organizations: Not on file  . Attends Banker Meetings: Not on file  . Marital Status: Not on file  Intimate Partner Violence:   . Fear of Current or Ex-Partner: Not on file  . Emotionally Abused: Not on file  . Physically Abused: Not on file  . Sexually Abused: Not on file     Review of Systems  All other systems reviewed and are negative.      Objective:   Physical Exam Vitals reviewed.  Constitutional:      General: He is not in acute distress.    Appearance: He is morbidly obese. He is not toxic-appearing or diaphoretic.  Cardiovascular:     Rate and Rhythm: Normal rate and regular rhythm.     Heart sounds: Normal heart sounds.  Pulmonary:     Effort: Pulmonary effort is normal. No respiratory distress.      Breath sounds: Wheezing present. No rales.  Abdominal:     General: There is distension.     Palpations: Abdomen is soft.     Tenderness: There is no abdominal tenderness. There is no guarding or rebound.  Musculoskeletal:        General: Swelling present.     Right lower leg: Edema present.     Left lower leg: Edema present.  Skin:    Findings: Lesion present.  Neurological:     Mental Status: He is alert.           Assessment & Plan:   Chronic venous insufficiency - Plan: BASIC METABOLIC PANEL WITH GFR  Chronic combined systolic and diastolic congestive heart failure (HCC) - Plan: BASIC METABOLIC PANEL WITH GFR  Venous stasis ulcer of calf, unspecified laterality, unspecified ulcer stage, unspecified whether varicose veins present (HCC) - Plan: BASIC METABOLIC PANEL WITH GFR  Patient is maintaining his weight on Lasix 40 mg twice daily.  Repeat BMP to monitor renal function at this dose.  If it stable a second time I believe this is a stable chronic dose he can maintain his body weight on.  Recheck potassium today.  Ultimate potassium repletion dose will depend upon his potassium today.  Unna boot is reapplied to the right leg and left leg as described above.  Wound care will come to the house on Monday to rechange the dressings.

## 2019-09-28 NOTE — Telephone Encounter (Addendum)
Left a message for Lanora Manis to call back.  There was no message given to triage as to why she called.

## 2019-09-28 NOTE — Telephone Encounter (Signed)
Unable to reach pt wife or leave a message  

## 2019-09-28 NOTE — Telephone Encounter (Signed)
New Message:     Please call, she wants to give you an  Update on what is going on with pt and to find out if they can assist.

## 2019-09-29 LAB — BASIC METABOLIC PANEL WITH GFR
BUN/Creatinine Ratio: 23 (calc) — ABNORMAL HIGH (ref 6–22)
BUN: 38 mg/dL — ABNORMAL HIGH (ref 7–25)
CO2: 45 mmol/L — ABNORMAL HIGH (ref 20–32)
Calcium: 8.7 mg/dL (ref 8.6–10.3)
Chloride: 85 mmol/L — ABNORMAL LOW (ref 98–110)
Creat: 1.66 mg/dL — ABNORMAL HIGH (ref 0.70–1.25)
GFR, Est African American: 49 mL/min/{1.73_m2} — ABNORMAL LOW (ref >=60)
GFR, Est Non African American: 42 mL/min/{1.73_m2} — ABNORMAL LOW (ref >=60)
Glucose, Bld: 167 mg/dL — ABNORMAL HIGH (ref 65–99)
Potassium: 3.1 mmol/L — ABNORMAL LOW (ref 3.5–5.3)
Sodium: 137 mmol/L (ref 135–146)

## 2019-09-29 NOTE — Telephone Encounter (Signed)
Spoke with pt wife, office visit 1-26 changed to video visit at patient request. They will call prior to appointment with concerns.

## 2019-10-02 DIAGNOSIS — E1151 Type 2 diabetes mellitus with diabetic peripheral angiopathy without gangrene: Secondary | ICD-10-CM | POA: Diagnosis not present

## 2019-10-02 DIAGNOSIS — I872 Venous insufficiency (chronic) (peripheral): Secondary | ICD-10-CM | POA: Diagnosis not present

## 2019-10-02 DIAGNOSIS — J449 Chronic obstructive pulmonary disease, unspecified: Secondary | ICD-10-CM | POA: Diagnosis not present

## 2019-10-02 DIAGNOSIS — I5042 Chronic combined systolic (congestive) and diastolic (congestive) heart failure: Secondary | ICD-10-CM | POA: Diagnosis not present

## 2019-10-02 DIAGNOSIS — I480 Paroxysmal atrial fibrillation: Secondary | ICD-10-CM | POA: Diagnosis not present

## 2019-10-02 DIAGNOSIS — L97811 Non-pressure chronic ulcer of other part of right lower leg limited to breakdown of skin: Secondary | ICD-10-CM | POA: Diagnosis not present

## 2019-10-02 DIAGNOSIS — I110 Hypertensive heart disease with heart failure: Secondary | ICD-10-CM | POA: Diagnosis not present

## 2019-10-02 DIAGNOSIS — M5136 Other intervertebral disc degeneration, lumbar region: Secondary | ICD-10-CM | POA: Diagnosis not present

## 2019-10-02 DIAGNOSIS — L97821 Non-pressure chronic ulcer of other part of left lower leg limited to breakdown of skin: Secondary | ICD-10-CM | POA: Diagnosis not present

## 2019-10-03 ENCOUNTER — Telehealth: Payer: Self-pay | Admitting: Family Medicine

## 2019-10-03 DIAGNOSIS — J449 Chronic obstructive pulmonary disease, unspecified: Secondary | ICD-10-CM | POA: Diagnosis not present

## 2019-10-03 DIAGNOSIS — E1151 Type 2 diabetes mellitus with diabetic peripheral angiopathy without gangrene: Secondary | ICD-10-CM | POA: Diagnosis not present

## 2019-10-03 DIAGNOSIS — L97821 Non-pressure chronic ulcer of other part of left lower leg limited to breakdown of skin: Secondary | ICD-10-CM | POA: Diagnosis not present

## 2019-10-03 DIAGNOSIS — L97811 Non-pressure chronic ulcer of other part of right lower leg limited to breakdown of skin: Secondary | ICD-10-CM | POA: Diagnosis not present

## 2019-10-03 DIAGNOSIS — M5136 Other intervertebral disc degeneration, lumbar region: Secondary | ICD-10-CM | POA: Diagnosis not present

## 2019-10-03 DIAGNOSIS — I110 Hypertensive heart disease with heart failure: Secondary | ICD-10-CM | POA: Diagnosis not present

## 2019-10-03 DIAGNOSIS — I872 Venous insufficiency (chronic) (peripheral): Secondary | ICD-10-CM | POA: Diagnosis not present

## 2019-10-03 DIAGNOSIS — I5042 Chronic combined systolic (congestive) and diastolic (congestive) heart failure: Secondary | ICD-10-CM | POA: Diagnosis not present

## 2019-10-03 DIAGNOSIS — I050 Rheumatic mitral stenosis: Secondary | ICD-10-CM | POA: Diagnosis not present

## 2019-10-03 DIAGNOSIS — I480 Paroxysmal atrial fibrillation: Secondary | ICD-10-CM | POA: Diagnosis not present

## 2019-10-03 NOTE — Telephone Encounter (Signed)
Yes, take primer pill in the morning one time but keep lasix at 40 bid.

## 2019-10-03 NOTE — Telephone Encounter (Signed)
Pt notified Verbalizes understanding 

## 2019-10-03 NOTE — Telephone Encounter (Signed)
Pt's wife called and states that he is up 2 pounds and wanted to know if you wanted him to take the primer pills for 2 days to jump start his kidneys??

## 2019-10-03 NOTE — Telephone Encounter (Signed)
LM TO CALL BACK ./CY 

## 2019-10-04 ENCOUNTER — Ambulatory Visit: Payer: Self-pay

## 2019-10-06 DIAGNOSIS — J449 Chronic obstructive pulmonary disease, unspecified: Secondary | ICD-10-CM | POA: Diagnosis not present

## 2019-10-06 DIAGNOSIS — I480 Paroxysmal atrial fibrillation: Secondary | ICD-10-CM | POA: Diagnosis not present

## 2019-10-06 DIAGNOSIS — I872 Venous insufficiency (chronic) (peripheral): Secondary | ICD-10-CM | POA: Diagnosis not present

## 2019-10-06 DIAGNOSIS — L97821 Non-pressure chronic ulcer of other part of left lower leg limited to breakdown of skin: Secondary | ICD-10-CM | POA: Diagnosis not present

## 2019-10-06 DIAGNOSIS — M5136 Other intervertebral disc degeneration, lumbar region: Secondary | ICD-10-CM | POA: Diagnosis not present

## 2019-10-06 DIAGNOSIS — E1151 Type 2 diabetes mellitus with diabetic peripheral angiopathy without gangrene: Secondary | ICD-10-CM | POA: Diagnosis not present

## 2019-10-06 DIAGNOSIS — I110 Hypertensive heart disease with heart failure: Secondary | ICD-10-CM | POA: Diagnosis not present

## 2019-10-06 DIAGNOSIS — I5042 Chronic combined systolic (congestive) and diastolic (congestive) heart failure: Secondary | ICD-10-CM | POA: Diagnosis not present

## 2019-10-06 DIAGNOSIS — L97811 Non-pressure chronic ulcer of other part of right lower leg limited to breakdown of skin: Secondary | ICD-10-CM | POA: Diagnosis not present

## 2019-10-10 DIAGNOSIS — J449 Chronic obstructive pulmonary disease, unspecified: Secondary | ICD-10-CM | POA: Diagnosis not present

## 2019-10-10 DIAGNOSIS — L97821 Non-pressure chronic ulcer of other part of left lower leg limited to breakdown of skin: Secondary | ICD-10-CM | POA: Diagnosis not present

## 2019-10-10 DIAGNOSIS — L97811 Non-pressure chronic ulcer of other part of right lower leg limited to breakdown of skin: Secondary | ICD-10-CM | POA: Diagnosis not present

## 2019-10-10 DIAGNOSIS — I872 Venous insufficiency (chronic) (peripheral): Secondary | ICD-10-CM | POA: Diagnosis not present

## 2019-10-10 DIAGNOSIS — E1151 Type 2 diabetes mellitus with diabetic peripheral angiopathy without gangrene: Secondary | ICD-10-CM | POA: Diagnosis not present

## 2019-10-10 DIAGNOSIS — I480 Paroxysmal atrial fibrillation: Secondary | ICD-10-CM | POA: Diagnosis not present

## 2019-10-10 DIAGNOSIS — I5042 Chronic combined systolic (congestive) and diastolic (congestive) heart failure: Secondary | ICD-10-CM | POA: Diagnosis not present

## 2019-10-10 DIAGNOSIS — I110 Hypertensive heart disease with heart failure: Secondary | ICD-10-CM | POA: Diagnosis not present

## 2019-10-10 DIAGNOSIS — M5136 Other intervertebral disc degeneration, lumbar region: Secondary | ICD-10-CM | POA: Diagnosis not present

## 2019-10-12 ENCOUNTER — Other Ambulatory Visit: Payer: Self-pay | Admitting: Family Medicine

## 2019-10-12 DIAGNOSIS — R944 Abnormal results of kidney function studies: Secondary | ICD-10-CM

## 2019-10-13 ENCOUNTER — Other Ambulatory Visit: Payer: Self-pay

## 2019-10-13 ENCOUNTER — Other Ambulatory Visit: Payer: Medicare HMO

## 2019-10-13 DIAGNOSIS — I5042 Chronic combined systolic (congestive) and diastolic (congestive) heart failure: Secondary | ICD-10-CM | POA: Diagnosis not present

## 2019-10-13 DIAGNOSIS — I480 Paroxysmal atrial fibrillation: Secondary | ICD-10-CM | POA: Diagnosis not present

## 2019-10-13 DIAGNOSIS — R944 Abnormal results of kidney function studies: Secondary | ICD-10-CM | POA: Diagnosis not present

## 2019-10-13 DIAGNOSIS — E1151 Type 2 diabetes mellitus with diabetic peripheral angiopathy without gangrene: Secondary | ICD-10-CM | POA: Diagnosis not present

## 2019-10-13 DIAGNOSIS — I110 Hypertensive heart disease with heart failure: Secondary | ICD-10-CM | POA: Diagnosis not present

## 2019-10-13 DIAGNOSIS — L97811 Non-pressure chronic ulcer of other part of right lower leg limited to breakdown of skin: Secondary | ICD-10-CM | POA: Diagnosis not present

## 2019-10-13 DIAGNOSIS — I872 Venous insufficiency (chronic) (peripheral): Secondary | ICD-10-CM | POA: Diagnosis not present

## 2019-10-13 DIAGNOSIS — M5136 Other intervertebral disc degeneration, lumbar region: Secondary | ICD-10-CM | POA: Diagnosis not present

## 2019-10-13 DIAGNOSIS — J449 Chronic obstructive pulmonary disease, unspecified: Secondary | ICD-10-CM | POA: Diagnosis not present

## 2019-10-13 DIAGNOSIS — L97821 Non-pressure chronic ulcer of other part of left lower leg limited to breakdown of skin: Secondary | ICD-10-CM | POA: Diagnosis not present

## 2019-10-13 IMAGING — DX PORTABLE CHEST - 1 VIEW
1 series · 1 of 1 positions shown · non-contrast
Comparison: Radiograph 03/31/2019

CLINICAL DATA: Tachycardia. Hx of diabetes. Hx of a-fib. Ex-smoker.
Hx of blood clot in lungs. No heart or lung surgeries.tachycardia

EXAM:
PORTABLE CHEST 1 VIEW

[chest ap]
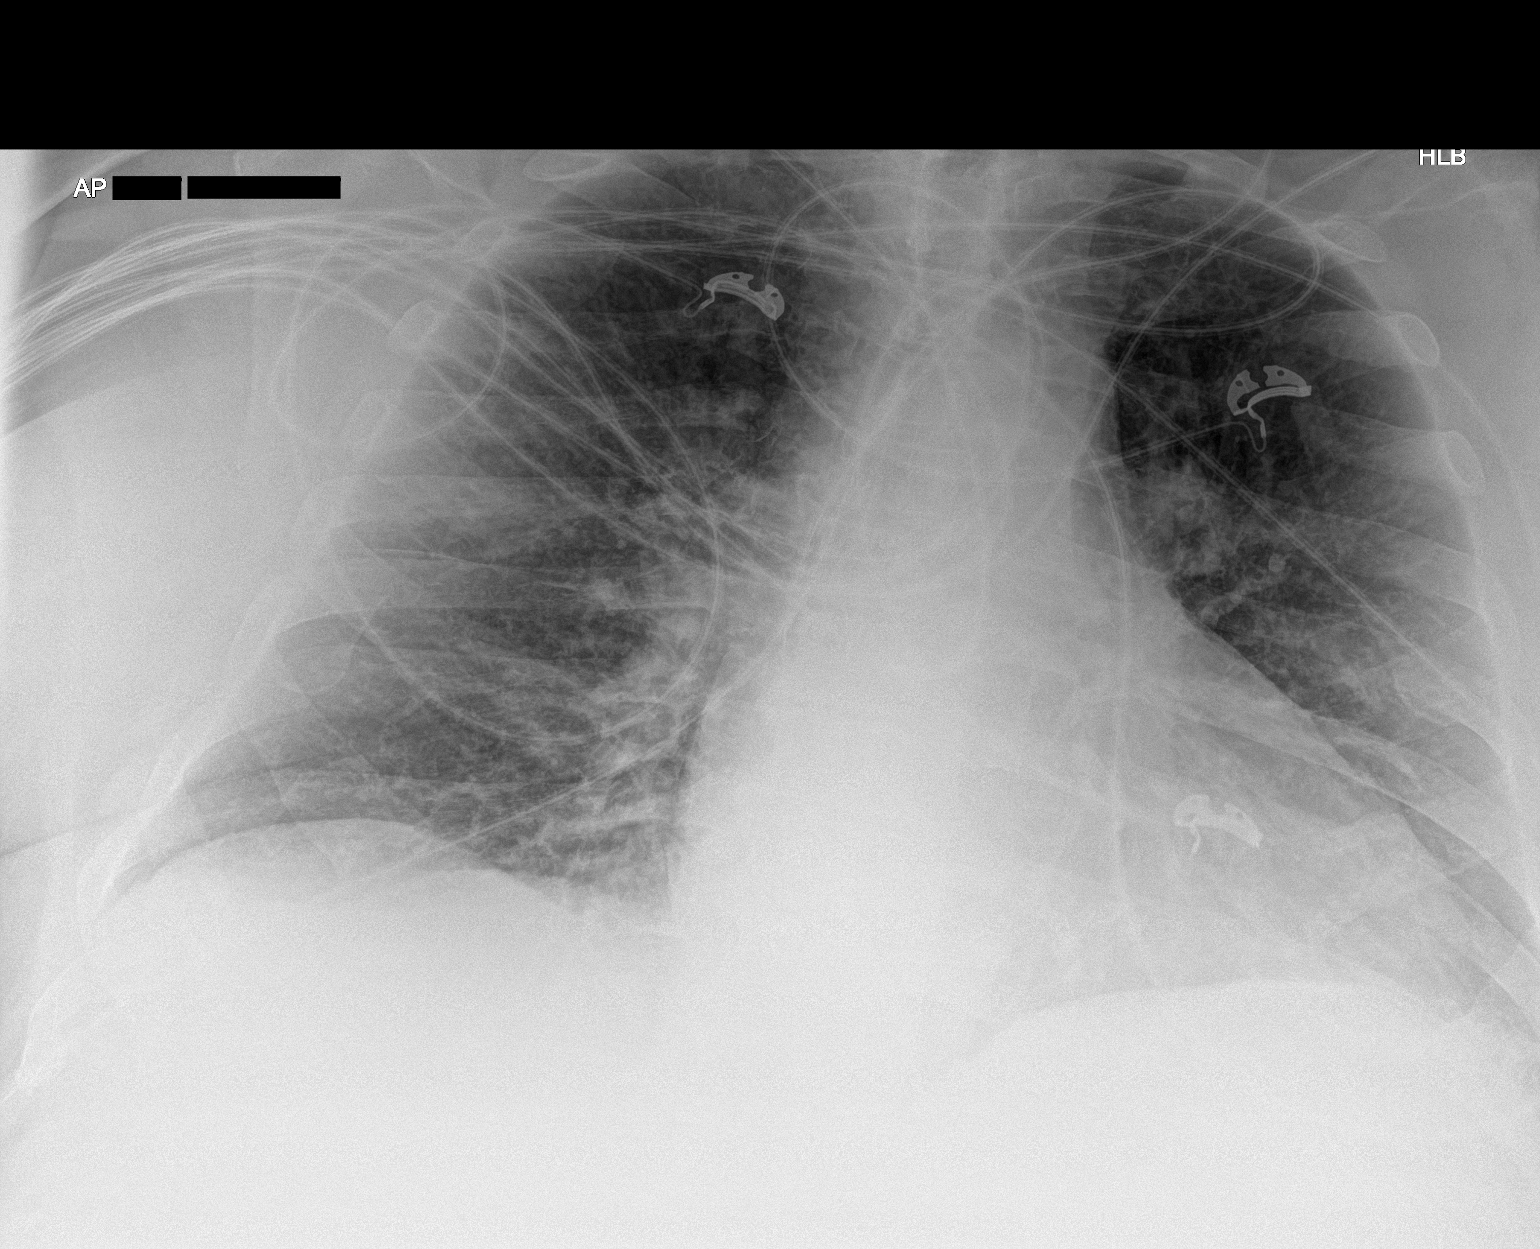

[1 of 1 positions shown; findings below may reference images not displayed]

FINDINGS: Normal mediastinum and cardiac silhouette. Normal pulmonary
vasculature. No evidence of effusion, infiltrate, or pneumothorax.
No acute bony abnormality.
IMPRESSION: No acute cardiopulmonary process.

## 2019-10-13 NOTE — Progress Notes (Signed)
Virtual Visit via Video Note   This visit type was conducted due to national recommendations for restrictions regarding the COVID-19 Pandemic (e.g. social distancing) in an effort to limit this patient's exposure and mitigate transmission in our community.  Due to his co-morbid illnesses, this patient is at least at moderate risk for complications without adequate follow up.  This format is felt to be most appropriate for this patient at this time.  All issues noted in this document were discussed and addressed.  A limited physical exam was performed with this format.  Please refer to the patient's chart for his consent to telehealth for [facility].    HPI: Fuparoxysmal atrial fibrillation and CHF; has had previous cardioversions. CTA June 2018 showed small nonocclusive pulmonary embolus. Placed on anticoagulation.ABI September 2020 normal.  Lower extremity venous Dopplers November 2020 showed no DVT. Echocardiogram December 2020 showed normal LV function, mild right ventricular enlargement, moderate left atrial enlargement, mild right atrial enlargement and trace aortic insufficiency.  Admitted December 2020 with acute on chronic diastolic congestive heart failure.  Also in atrial fibrillation.  Patient was diuresed and also ultimately underwent cardioversion.  He was also placed on amiodarone both for atrial fibrillation and nonsustained ventricular tachycardia.  Since last seen,patient denies increased dyspnea, chest pain, palpitations or syncope.  His pedal edema is improving.  Current Outpatient Medications  Medication Sig Dispense Refill  . acetaminophen (TYLENOL) 500 MG tablet Take 1,000 mg by mouth 2 (two) times daily as needed (pain).    Marland Kitchen amiodarone (PACERONE) 200 MG tablet Take 1 tablet (200 mg total) by mouth daily. Start after finished the 400 mg tabs, in 2 weeks. 30 tablet 5  . Dulaglutide (TRULICITY) 1.5 ON/6.2XB SOPN INJECT 1.5 MG INTO THE SKIN ONCE A WEEK (Patient taking  differently: Inject 1.5 mg into the skin every Friday. ) 4 pen 3  . furosemide (LASIX) 20 MG tablet Take 2 tablets (40 mg total) by mouth 2 (two) times daily. 90 tablet 3  . metoprolol tartrate (LOPRESSOR) 50 MG tablet Take 1 tablet by mouth twice daily (Patient taking differently: Take 50 mg by mouth 2 (two) times daily. ) 180 tablet 0  . OXYGEN Inhale 2 L into the lungs continuous.    . potassium chloride SA (KLOR-CON) 20 MEQ tablet Take 2 tablets (40 mEq total) by mouth daily. 30 tablet 3  . pravastatin (PRAVACHOL) 20 MG tablet Take 1 tablet (20 mg total) by mouth daily. 90 tablet 3  . rivaroxaban (XARELTO) 20 MG TABS tablet TAKE 1 TABLET BY MOUTH ONCE DAILY WITH SUPPER (Patient taking differently: Take 20 mg by mouth daily with supper. ) 90 tablet 3  . sitaGLIPtin (JANUVIA) 100 MG tablet Take 1 tablet (100 mg total) by mouth daily. (Patient taking differently: Take 100 mg by mouth daily with supper. ) 90 tablet 2   No current facility-administered medications for this visit.     Past Medical History:  Diagnosis Date  . Acute respiratory failure with hypoxia (HCC)    Hospitalized with acute respiratory failure secondary to CAP, diastolic CHF, and pulmonary embolism  . AKI (acute kidney injury) ([place])   . Allergy    allergic rhinitis  . Altered mental status   . Atrial fibrillation ([place])   . Chronic anticoagulation 03/26/2017   Failed Coumadin (PE with therapeutic INR). Now on Xarelto  . Colon polyps   . Community acquired pneumonia of right lower lobe of lung   . COPD (chronic obstructive pulmonary disease) (  HCC)   . Degenerative disc disease, lumbar   . Diabetes mellitus without complication (HCC)   . History of pulmonary embolus (PE) 03/26/2017  . Hypertension   . Mitral regurgitation 12/26/2012  . Obesity (BMI 30-39.9) 12/09/2012  . PAF (paroxysmal atrial fibrillation) (HCC)    DCCV in 2014 with recurrent PAF in setting of respiratory failure June 2018. S/P DCCV 04/13/17 to NSR  .  Persistent atrial fibrillation (HCC)   . Pneumonia ~ 2016  . Sepsis (HCC) 03/10/2017  . Sleep apnea   . Systolic murmur   . Type II diabetes mellitus (HCC)     Past Surgical History:  Procedure Laterality Date  . CARDIOVERSION N/A 01/09/2013   Procedure: CARDIOVERSION;  Surgeon: Pricilla Riffle, MD;  Location: [facility] ENDOSCOPY;  Service: Cardiovascular;  Laterality: N/A;  . CARDIOVERSION N/A 04/13/2017   Procedure: CARDIOVERSION;  Surgeon: Quintella Reichert, MD;  Location: [facility] ENDOSCOPY;  Service: Cardiovascular;  Laterality: N/A;  . CARDIOVERSION N/A 09/01/2019   Procedure: CARDIOVERSION;  Surgeon: Jake Bathe, MD;  Location: [facility] ENDOSCOPY;  Service: Cardiovascular;  Laterality: N/A;  . TEE WITHOUT CARDIOVERSION N/A 01/09/2013   Procedure: TRANSESOPHAGEAL ECHOCARDIOGRAM (TEE);  Surgeon: Pricilla Riffle, MD;  Location: [facility] ENDOSCOPY;  Service: Cardiovascular;  Laterality: N/A;    Social History   Socioeconomic History  . Marital status: Married    Spouse name: Not on file  . Number of children: 2  . Years of education: Not on file  . Highest education level: Not on file  Occupational History    Comment: Machinist  Tobacco Use  . Smoking status: Former Smoker    Packs/day: 1.00    Years: 43.00    Pack years: 43.00    Types: Cigarettes    Quit date: 03/08/2017    Years since quitting: 2.6  . Smokeless tobacco: Never Used  Substance and Sexual Activity  . Alcohol use: No    Comment: Rare  . Drug use: No  . Sexual activity: Not Currently  Other Topics Concern  . Not on file  Social History Narrative  . Not on file   Social Determinants of Health   Financial Resource Strain:   . Difficulty of Paying Living Expenses: Not on file  Food Insecurity: No Food Insecurity  . Worried About Programme researcher, broadcasting/film/video in the Last Year: Never true  . Ran Out of Food in the Last Year: Never true  Transportation Needs: No Transportation Needs  . Lack of Transportation (Medical): No  . Lack of  Transportation (Non-Medical): No  Physical Activity:   . Days of Exercise per Week: Not on file  . Minutes of Exercise per Session: Not on file  Stress:   . Feeling of Stress : Not on file  Social Connections:   . Frequency of Communication with Friends and Family: Not on file  . Frequency of Social Gatherings with Friends and Family: Not on file  . Attends Religious Services: Not on file  . Active Member of Clubs or Organizations: Not on file  . Attends Banker Meetings: Not on file  . Marital Status: Not on file  Intimate Partner Violence:   . Fear of Current or Ex-Partner: Not on file  . Emotionally Abused: Not on file  . Physically Abused: Not on file  . Sexually Abused: Not on file    Family History  Problem Relation Age of Onset  . Cancer Mother 80       breast  .  Heart disease Father 55       MI  . Cancer Brother        lung  . Stroke Brother 45    ROS: no fevers or chills, productive cough, hemoptysis, dysphasia, odynophagia, melena, hematochezia, dysuria, hematuria, rash, seizure activity, orthopnea, PND, pedal edema, claudication. Remaining systems are negative.  Physical Exam: No acute distress Answers questions appropriately Normal affect Remainder physical examination not performed (telehealth visit, coronavirus pandemic)  A/P  1 paroxysmal atrial fibrillation-patient remains in sinus rhythm today.  We will continue amiodarone at present dose.  In 3 months we will plan TSH, liver functions and chest x-ray.  Continue Xarelto.  2 chronic diastolic congestive heart failure-volume status is reasonable.  Continue present dose of Lasix.  Check potassium and renal function.  Needs fluid restriction and low-sodium diet.  3 hypertension-blood pressure controlled.  Continue present medical regimen.  4 history of pulmonary embolus-continue Xarelto.  5 morbid obesity-we discussed the importance of diet, exercise and weight loss.  COVID-19 Education:  The importance of social distancing was discussed today.  Time:   Today, I have spent 15 minutes with the patient with telehealth technology discussing the above problems.     Medication Adjustments/Labs and Tests Ordered: Current medicines are reviewed at length with the patient today.  Concerns regarding medicines are outlined above.   Tests Ordered: No orders of the defined types were placed in this encounter.   Medication Changes: No orders of the defined types were placed in this encounter.   Follow Up:  Either In Person or Virtual in 4 month(s)  Signed, Olga Millers, MD  10/16/2019 1:53 PM    [facility]

## 2019-10-14 LAB — BASIC METABOLIC PANEL
BUN/Creatinine Ratio: 21 (calc) (ref 6–22)
BUN: 40 mg/dL — ABNORMAL HIGH (ref 7–25)
CO2: 45 mmol/L — ABNORMAL HIGH (ref 20–32)
Calcium: 9 mg/dL (ref 8.6–10.3)
Chloride: 82 mmol/L — ABNORMAL LOW (ref 98–110)
Creat: 1.94 mg/dL — ABNORMAL HIGH (ref 0.70–1.25)
Glucose, Bld: 142 mg/dL — ABNORMAL HIGH (ref 65–99)
Potassium: 3.3 mmol/L — ABNORMAL LOW (ref 3.5–5.3)
Sodium: 136 mmol/L (ref 135–146)

## 2019-10-16 ENCOUNTER — Other Ambulatory Visit: Payer: Self-pay | Admitting: Family Medicine

## 2019-10-16 DIAGNOSIS — E876 Hypokalemia: Secondary | ICD-10-CM

## 2019-10-16 DIAGNOSIS — R944 Abnormal results of kidney function studies: Secondary | ICD-10-CM

## 2019-10-16 MED ORDER — POTASSIUM CHLORIDE CRYS ER 20 MEQ PO TBCR: 40.0000 meq | EXTENDED_RELEASE_TABLET | Freq: Every day | ORAL | 3 refills | Status: AC

## 2019-10-17 ENCOUNTER — Encounter: Payer: Self-pay | Admitting: Cardiology

## 2019-10-17 ENCOUNTER — Telehealth (INDEPENDENT_AMBULATORY_CARE_PROVIDER_SITE_OTHER): Payer: Medicare HMO | Admitting: Cardiology

## 2019-10-17 VITALS — BP 126/64 | HR 62 | Ht 73.0 in | Wt 329.0 lb

## 2019-10-17 DIAGNOSIS — I10 Essential (primary) hypertension: Secondary | ICD-10-CM

## 2019-10-17 DIAGNOSIS — I5032 Chronic diastolic (congestive) heart failure: Secondary | ICD-10-CM | POA: Diagnosis not present

## 2019-10-17 DIAGNOSIS — I480 Paroxysmal atrial fibrillation: Secondary | ICD-10-CM

## 2019-10-17 DIAGNOSIS — I5042 Chronic combined systolic (congestive) and diastolic (congestive) heart failure: Secondary | ICD-10-CM | POA: Diagnosis not present

## 2019-10-17 DIAGNOSIS — L97821 Non-pressure chronic ulcer of other part of left lower leg limited to breakdown of skin: Secondary | ICD-10-CM | POA: Diagnosis not present

## 2019-10-17 DIAGNOSIS — L97811 Non-pressure chronic ulcer of other part of right lower leg limited to breakdown of skin: Secondary | ICD-10-CM | POA: Diagnosis not present

## 2019-10-17 DIAGNOSIS — E1151 Type 2 diabetes mellitus with diabetic peripheral angiopathy without gangrene: Secondary | ICD-10-CM | POA: Diagnosis not present

## 2019-10-17 DIAGNOSIS — J449 Chronic obstructive pulmonary disease, unspecified: Secondary | ICD-10-CM | POA: Diagnosis not present

## 2019-10-17 DIAGNOSIS — M5136 Other intervertebral disc degeneration, lumbar region: Secondary | ICD-10-CM | POA: Diagnosis not present

## 2019-10-17 DIAGNOSIS — I872 Venous insufficiency (chronic) (peripheral): Secondary | ICD-10-CM | POA: Diagnosis not present

## 2019-10-17 DIAGNOSIS — I110 Hypertensive heart disease with heart failure: Secondary | ICD-10-CM | POA: Diagnosis not present

## 2019-10-17 NOTE — Patient Instructions (Signed)
Medication Instructions:  NO CHANGE *If you need a refill on your cardiac medications before your next appointment, please call your pharmacy*  Lab Work: If you have labs (blood work) drawn today and your tests are completely normal, you will receive your results only by: Marland Kitchen MyChart Message (if you have MyChart) OR . A paper copy in the mail If you have any lab test that is abnormal or we need to change your treatment, we will call you to review the results.  Follow-Up: At [facility], you and your health needs are our priority.  As part of our continuing mission to provide you with exceptional heart care, we have created designated Provider Care Teams.  These Care Teams include your primary Cardiologist (physician) and Advanced Practice Providers (APPs -  Physician Assistants and Nurse Practitioners) who all work together to provide you with the care you need, when you need it.  Your next appointment:   4 month(s)  The format for your next appointment:   Either In Person or Virtual  Provider:   You may see Olga Millers, MD or one of the following Advanced Practice Providers on your designated Care Team:    Corine Shelter, PA-C  [place], [place]  Edd Fabian, [place]

## 2019-10-20 DIAGNOSIS — L97821 Non-pressure chronic ulcer of other part of left lower leg limited to breakdown of skin: Secondary | ICD-10-CM | POA: Diagnosis not present

## 2019-10-20 DIAGNOSIS — I480 Paroxysmal atrial fibrillation: Secondary | ICD-10-CM | POA: Diagnosis not present

## 2019-10-20 DIAGNOSIS — I5042 Chronic combined systolic (congestive) and diastolic (congestive) heart failure: Secondary | ICD-10-CM | POA: Diagnosis not present

## 2019-10-20 DIAGNOSIS — I872 Venous insufficiency (chronic) (peripheral): Secondary | ICD-10-CM | POA: Diagnosis not present

## 2019-10-20 DIAGNOSIS — L97811 Non-pressure chronic ulcer of other part of right lower leg limited to breakdown of skin: Secondary | ICD-10-CM | POA: Diagnosis not present

## 2019-10-20 DIAGNOSIS — I110 Hypertensive heart disease with heart failure: Secondary | ICD-10-CM | POA: Diagnosis not present

## 2019-10-20 DIAGNOSIS — E1151 Type 2 diabetes mellitus with diabetic peripheral angiopathy without gangrene: Secondary | ICD-10-CM | POA: Diagnosis not present

## 2019-10-20 DIAGNOSIS — M5136 Other intervertebral disc degeneration, lumbar region: Secondary | ICD-10-CM | POA: Diagnosis not present

## 2019-10-20 DIAGNOSIS — J449 Chronic obstructive pulmonary disease, unspecified: Secondary | ICD-10-CM | POA: Diagnosis not present

## 2019-10-22 DIAGNOSIS — L97811 Non-pressure chronic ulcer of other part of right lower leg limited to breakdown of skin: Secondary | ICD-10-CM | POA: Diagnosis not present

## 2019-10-22 DIAGNOSIS — I872 Venous insufficiency (chronic) (peripheral): Secondary | ICD-10-CM | POA: Diagnosis not present

## 2019-10-22 DIAGNOSIS — L97821 Non-pressure chronic ulcer of other part of left lower leg limited to breakdown of skin: Secondary | ICD-10-CM | POA: Diagnosis not present

## 2019-10-22 DIAGNOSIS — I110 Hypertensive heart disease with heart failure: Secondary | ICD-10-CM | POA: Diagnosis not present

## 2019-10-22 DIAGNOSIS — I5042 Chronic combined systolic (congestive) and diastolic (congestive) heart failure: Secondary | ICD-10-CM | POA: Diagnosis not present

## 2019-10-22 DIAGNOSIS — J449 Chronic obstructive pulmonary disease, unspecified: Secondary | ICD-10-CM | POA: Diagnosis not present

## 2019-10-22 DIAGNOSIS — M5136 Other intervertebral disc degeneration, lumbar region: Secondary | ICD-10-CM | POA: Diagnosis not present

## 2019-10-22 DIAGNOSIS — I480 Paroxysmal atrial fibrillation: Secondary | ICD-10-CM | POA: Diagnosis not present

## 2019-10-22 DIAGNOSIS — E1151 Type 2 diabetes mellitus with diabetic peripheral angiopathy without gangrene: Secondary | ICD-10-CM | POA: Diagnosis not present

## 2019-10-24 DIAGNOSIS — I480 Paroxysmal atrial fibrillation: Secondary | ICD-10-CM | POA: Diagnosis not present

## 2019-10-24 DIAGNOSIS — M5136 Other intervertebral disc degeneration, lumbar region: Secondary | ICD-10-CM | POA: Diagnosis not present

## 2019-10-24 DIAGNOSIS — L97821 Non-pressure chronic ulcer of other part of left lower leg limited to breakdown of skin: Secondary | ICD-10-CM | POA: Diagnosis not present

## 2019-10-24 DIAGNOSIS — J449 Chronic obstructive pulmonary disease, unspecified: Secondary | ICD-10-CM | POA: Diagnosis not present

## 2019-10-24 DIAGNOSIS — I110 Hypertensive heart disease with heart failure: Secondary | ICD-10-CM | POA: Diagnosis not present

## 2019-10-24 DIAGNOSIS — I872 Venous insufficiency (chronic) (peripheral): Secondary | ICD-10-CM | POA: Diagnosis not present

## 2019-10-24 DIAGNOSIS — R0902 Hypoxemia: Secondary | ICD-10-CM | POA: Diagnosis not present

## 2019-10-24 DIAGNOSIS — L97811 Non-pressure chronic ulcer of other part of right lower leg limited to breakdown of skin: Secondary | ICD-10-CM | POA: Diagnosis not present

## 2019-10-24 DIAGNOSIS — E1151 Type 2 diabetes mellitus with diabetic peripheral angiopathy without gangrene: Secondary | ICD-10-CM | POA: Diagnosis not present

## 2019-10-24 DIAGNOSIS — I5042 Chronic combined systolic (congestive) and diastolic (congestive) heart failure: Secondary | ICD-10-CM | POA: Diagnosis not present

## 2019-10-26 DIAGNOSIS — I110 Hypertensive heart disease with heart failure: Secondary | ICD-10-CM | POA: Diagnosis not present

## 2019-10-26 DIAGNOSIS — E1151 Type 2 diabetes mellitus with diabetic peripheral angiopathy without gangrene: Secondary | ICD-10-CM | POA: Diagnosis not present

## 2019-10-26 DIAGNOSIS — I872 Venous insufficiency (chronic) (peripheral): Secondary | ICD-10-CM | POA: Diagnosis not present

## 2019-10-26 DIAGNOSIS — I5042 Chronic combined systolic (congestive) and diastolic (congestive) heart failure: Secondary | ICD-10-CM | POA: Diagnosis not present

## 2019-10-26 DIAGNOSIS — J449 Chronic obstructive pulmonary disease, unspecified: Secondary | ICD-10-CM | POA: Diagnosis not present

## 2019-10-26 DIAGNOSIS — I480 Paroxysmal atrial fibrillation: Secondary | ICD-10-CM | POA: Diagnosis not present

## 2019-10-26 DIAGNOSIS — M5136 Other intervertebral disc degeneration, lumbar region: Secondary | ICD-10-CM | POA: Diagnosis not present

## 2019-10-26 DIAGNOSIS — L97821 Non-pressure chronic ulcer of other part of left lower leg limited to breakdown of skin: Secondary | ICD-10-CM | POA: Diagnosis not present

## 2019-10-26 DIAGNOSIS — L97811 Non-pressure chronic ulcer of other part of right lower leg limited to breakdown of skin: Secondary | ICD-10-CM | POA: Diagnosis not present

## 2019-10-27 ENCOUNTER — Other Ambulatory Visit: Payer: Self-pay

## 2019-10-27 ENCOUNTER — Ambulatory Visit (INDEPENDENT_AMBULATORY_CARE_PROVIDER_SITE_OTHER): Payer: Medicare HMO | Admitting: Family Medicine

## 2019-10-27 VITALS — BP 110/64 | HR 76 | Temp 96.9°F | Resp 20 | Ht 73.0 in | Wt 316.0 lb

## 2019-10-27 DIAGNOSIS — L97209 Non-pressure chronic ulcer of unspecified calf with unspecified severity: Secondary | ICD-10-CM

## 2019-10-27 DIAGNOSIS — I83002 Varicose veins of unspecified lower extremity with ulcer of calf: Secondary | ICD-10-CM | POA: Diagnosis not present

## 2019-10-27 DIAGNOSIS — L97821 Non-pressure chronic ulcer of other part of left lower leg limited to breakdown of skin: Secondary | ICD-10-CM | POA: Diagnosis not present

## 2019-10-27 DIAGNOSIS — M5136 Other intervertebral disc degeneration, lumbar region: Secondary | ICD-10-CM | POA: Diagnosis not present

## 2019-10-27 DIAGNOSIS — I5042 Chronic combined systolic (congestive) and diastolic (congestive) heart failure: Secondary | ICD-10-CM | POA: Diagnosis not present

## 2019-10-27 DIAGNOSIS — I480 Paroxysmal atrial fibrillation: Secondary | ICD-10-CM | POA: Diagnosis not present

## 2019-10-27 DIAGNOSIS — I110 Hypertensive heart disease with heart failure: Secondary | ICD-10-CM | POA: Diagnosis not present

## 2019-10-27 DIAGNOSIS — J449 Chronic obstructive pulmonary disease, unspecified: Secondary | ICD-10-CM | POA: Diagnosis not present

## 2019-10-27 DIAGNOSIS — I872 Venous insufficiency (chronic) (peripheral): Secondary | ICD-10-CM | POA: Diagnosis not present

## 2019-10-27 DIAGNOSIS — L97811 Non-pressure chronic ulcer of other part of right lower leg limited to breakdown of skin: Secondary | ICD-10-CM | POA: Diagnosis not present

## 2019-10-27 DIAGNOSIS — E1151 Type 2 diabetes mellitus with diabetic peripheral angiopathy without gangrene: Secondary | ICD-10-CM | POA: Diagnosis not present

## 2019-10-27 NOTE — Progress Notes (Signed)
Subjective:    Patient ID: William Alvarado, male    DOB: January 10, 1951, 69 y.o.   MRN: [id number]  HPI 09/12/19 Patient is here today for hospital discharge follow-up.  Was recently admitted to the hospital with shortness of breath due to congestive heart failure.  He had been seen previously as an outpatient but was unsuccessful in diuresis on oral diuretics.  Patient was switched to IV Lasix and diuresed approximately 6 L at which point his breathing improved and he was discharged home.  Patient was discharged home on Lasix, 20 mg tablets, 2 tablets twice daily.  Therefore he is taking 40 mg twice daily.  Recently he had lab work obtained which showed that his creatinine had risen from 1.5 to greater than 2.0 suggesting possible dehydration.  However due to swelling in his legs and swelling in his abdomen and ineffective diuresis at home, his Lasix dose was recently increased to 60 mg in the morning and 40 mg in the evening.  Due to an elevated potassium, however the patient's potassium supplement was discontinued.  Here today, the patient's lungs are clear to auscultation bilaterally except for a few isolated expiratory wheezes.  He has a large protuberant firm abdomen.  However there is only trace edema in his legs due to compression wraps.  There is however significant venous stasis ulcers and weeping sores per tickly on his right anterior shin.  Please see the photograph below:   The great toenail on the patient's right foot also recently was removed after suffering an injury.  A blister due to edema in the right foot developed circumferentially around the proximal portion of the right great toe and has since ruptured.  There is foul-smelling drainage coming from this area.  The drainage is serous in nature.  However the patient's toe definitely admits a foul odor from both the nailbed and from the proximal portion.  The toe is purple in color as is the rest of his foot.  There is no evidence of  ischemia or necrosis however the toe is cool to the touch.  Recent TBI in the right foot was normal in September suggesting this discoloration is due to venous congestion rather than arterial insufficiency.  At that time, my plan was: Patient's renal function has been trending upward after diuresis.  Today he does not appear fluid overloaded on exam.  His lungs sound clear.  Therefore I will recheck a BMP and if his creatinine continues to trend upward I would likely reduce his Lasix back to 40 mg twice daily.  Regarding the venous insufficiency and venous stasis ulcers in his legs, his right leg has numerous areas of skin breakdown as shown in the photograph above.  Each of these areas was covered with Silvadene and nonadherent gauze and then placed in an [employer].  The left leg shows no skin breakdown.  It was simply placed in an [employer].  I have recommended that they change the Unna boot on Friday and I will see the patient back on Monday for reassessment.  TBI's in September showed normal perfusion to the toe.  Therefore I do not feel that the foul odor is due to arterial insufficiency and wet gangrene.  Instead I feel that the patient has chronic venous congestion causing skin breakdown and weeping sores to develop on his toe which provide a foul odor when wrapped in gauze.  There is no erythema or evidence of infection.  Therefore I recommended that they cover  the wound on the proximal toe with Neosporin and wrap with gauze daily and keep the dressings changed.  I will reassess this on Monday as well.  09/18/19 Creatinine had risen substantially from 2-2.73.  Therefore I recommend reducing Lasix to 20 mg twice a day as the patient was appearing dehydrated.  He is here today to recheck his legs as well as his renal function.  The wounds on his anterior right shin and posterior right calf are essentially unchanged although the edema is much better.  The edema in his left leg has improved dramatically  and there are only trace small ulcers on the lower anterior left shin.  However the patient has gained significant weight since his visit when I decreased his Lasix. Wt Readings from Last 3 Encounters:  10/27/19 (!) 316 lb (143.3 kg)  10/17/19 (!) 329 lb (149.2 kg)  09/28/19 (!) 328 lb (148.8 kg)   He has gained 8 pounds.  He also reports worsening dyspnea on exertion.  Patient appears fluid overloaded with increasing abdominal girth.  Therefore I feel that he needs diuresis despite his worsening renal function.  Also his left great toe has a expanding foul-smelling ulcer and area of desquamation proximal to the toenail/nailbed.  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  The odor is quite foul and smells like necrosis.  Despite his normal TBI's this appears to be ischemic.  Therefore I have recommended that he follow-up with vascular surgery as I feel the toe may require amputation.  At that time, my plan was: Unna boots were reapplied to both legs.  We will recheck the patient on Thursday to change his Unna boots at that time.  Venous stasis ulcers particular on the right leg are essentially unchanged.  There is no evidence of secondary cellulitis on either leg.  Patient appears fluid overloaded and has gained 8 pounds since reducing his Lasix.  Therefore despite his acute renal failure with worsening creatinine I feel we have no choice.  I feel the patient would likely wind up back in the hospital with pulmonary edema.  Therefore we will increase his Lasix back to 40 mg twice daily and he will use Zaroxolyn in the morning tomorrow prior to Lasix and also in the morning Wednesday prior to Lasix.  I will recheck the patient on Thursday and repeat his renal function at that time.  We will likely hold Zaroxolyn at that point and see if he can tolerate just the 40 mg twice daily of Lasix from them moving forward.  My biggest concern is also the foul-smelling ulcer on the anterior surface of his left great toe that appears  to be wet gangrene.  I will consult vascular surgery but I feel this toe likely requires amputation.  We will get a second opinion.        09/21/19  Patient is here today for a wound recheck.  Since I last saw the patient he has diuresed approximately 4 pounds.  His breathing is better although he still appears fluid overloaded despite using Zaroxolyn every morning the last 2 days and also increasing his Lasix to 40 mg twice daily.  He still states that he feels much better.  The Unna boot was removed from his left leg.  There is a small superficial ulcer on the anterolateral aspect of his left shin just above his ankle.  However the area shown above over the distal left great toe is essentially unchanged.  It is foul-smelling and nonhealing.  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  There is no evidence of any secondary infection however the toe has a very foul odor.  Patient has yet to hear from vascular surgery.  The Unna boot was removed from his right leg.  Patient accidentally sustained a 4 cm vertical laceration while the Unna boot was removed there was bleeding significantly.  This was closed immediately with three 3-0 Ethilon sutures.  However the superficial ulcer shown above have improved dramatically on the right leg and are healing well.  The patient was then placed back in Unna boots bilaterally.  At that time, my plan was: On a positive note, the patient feels better after losing 4 pounds.  I will continue the patient on Lasix 40 mg twice daily and see him back on Monday.  At that time I will recheck his renal function to see how he is maintaining on the higher dose of diuretic.  Also on a positive note, the venous stasis ulcers on his anterior right shin have improved dramatically.  Unna boots were placed on the left leg and the right leg today and I anticipate continued improvement.  Patient accidentally sustained a laceration of his right leg that was closed  with 3 sutures while removing the Unna boot to control bleeding.  This was done using sterile technique.  The area was anesthetized with 0.1% lidocaine with epinephrine.  It was cleaned thoroughly with Betadine.  3 simple interrupted 3-0 Ethilon sutures were then placed to close the laceration.  Copious amounts of Silvadene were applied to the stitches and then covered with nonadherent gauze.  The leg was then placed in an [employer].  I recommended seeing the patient back on Monday to remove the stitches if possible and to continue Unna boot dressing changes.  My biggest concern now is the nonhealing skin breakdown on his left great toe which appears to be wet gangrene.  Await vascular surgery consultation however I suspect that the patient will require amputation.  Recheck on Monday and recheck renal function at that time.  09/25/19       Though wet gangrene on the dorsal surface of the left great toe is actually shrinking in size.  There is less drainage today.  There is no evidence of secondary cellulitis.  Please see photograph #1 above.  The numerous weeping venous stasis ulcers on the anterior surface of the right shin have all closed and look exceptionally well today.  The sutures along the anterior right shin that were placed at the last office visit are not able to be removed as the wound has not healed completely.  It is still oozing blood.  Please see photograph #2 above.  However the patient has a new bleeding ulcer on his posterior right calf as shown in photograph #3 above that is roughly the diameter of a quarter.  He also has some streaking erythema going up his lateral right leg just above his knee onto his right lateral thigh.  However the patient has lost 9 pounds since his last office visit and continues to diurese well on 40 mg of Lasix twice a day.  He states that his breathing is much better.  At that time, my plan was: Patient appears to be diuresing well on 40 mg of Lasix twice  daily.  The question is how his renal function is tolerating this dose.  I will check a BMP today and as long as his renal function is stable  we will maintain this dose as the patient appears to be diuresing well with it.  The wet gangrenous area on the dorsal surface of the left great toe is smaller in size today and looks better.  I placed nonadherent gauze between each of his toes and then wrapped the foot and Coban and an Unna boot dressing to help with some of the weeping serous edema coming from between the toes.  Nonadherent gauze covered in Silvadene was applied to the bleeding ulcer on the posterior right calf and the right leg was placed in an [employer] as well.  I am concerned about secondary cellulitis in the right leg and therefore I will start the patient on Keflex 500 mg 3 times daily for 7 days.  Recheck the patient on Thursday and if the wounds continue to improve I will remove the stitches on Thursday and transition the patient to home health dressing changes.  09/28/19  Patient saw vascular surgery earlier this week who recommended wrapping his left foot more loosely to avoid ischemia to the toe where there is wet gangrene.  He also recommended soaking the toe frequently.  Patient today is in an Ace wrap on the foot.  The Ace wrap to soaked with serous drainage.  There is literally serous fluid weeping from the wounds on his posterior left leg and also from a bulla located above the compression wrap on his left leg.  This is dripping onto the floor as we remove the dressing.  Unna boot is removed from the right leg.  There are 3 areas of bleeding on anterior right shin, posterior right calf and lateral right shin where the skin l tears as the dressing is removed.  Each of these areas of bleeding is covered with Silvadene, nonadherent gauze and and the patient is placed back in an [employer].  Regarding his left leg, the left ankle is wrapped loosely in an Unna boot progressively tightening as we  had up the calf near the weeping venous stasis ulcers.  Coban is applied as well in the same fashion so that the ends of the toes are available to be soaked in warm soapy water as recommended by vascular surgery.  Most recent BMP showed improved renal function although he had hypokalemia.  10/27/19 Home health care nurses are coming out to the patient's house every Monday and Thursday performing Unna boot dressing changes.  His right leg looks excellent today.  I removed his Unna boot and the patient has a small area of bleeding on his anterior right shin that is roughly the size of a dime.  He also has a black thick hard lesion on the dorsum of his right foot that is bleeding and has been there since last year.  This appears to be a thrombosed vein.  There is some local necrosis around it.  This is roughly 1 cm in diameter.  However other than these 2 lesions, there is very little swelling in the right leg.  His left leg also looks excellent.  His wife has been doing a wonderful job dressing his toes.  The skin has denuded off this anterior surface of the great toe.  The skin from the IP joint to the MTP joint is missing down to the underlying dermis.  However there is no necrosis or foul smell.  There is similar absent epithelial tissue over the surface of the second toe from the IP joint to the MTP joint.  This was covered with  an Radio broadcast assistant gauze in an effort to perform wet-to-dry dressing changes.  It was then covered with nonadherent gauze and then wrapped very gently in absorbable gauze to soak up the extra fluid.  The necrotic tissue has reabsorbed from the dorsum of the third and fourth toes.  There is no open wound.  The toes are not draining there any further.  The wife has done a wonderful job this is the best his foot has looked in months.  He does have some trace edema in his left leg but overall the patient seems to be doing very well.  Physical therapy is coming to his house and working with him.  He  seems to be maintaining his weight on Lasix.  He is here today mainly to recheck his renal function. Past Medical History:  Diagnosis Date   Acute respiratory failure with hypoxia [facility])    Hospitalized with acute respiratory failure secondary to CAP, diastolic CHF, and pulmonary embolism   AKI (acute kidney injury) (HCC)    Allergy    allergic rhinitis   Altered mental status    Atrial fibrillation (HCC)    Chronic anticoagulation 03/26/2017   Failed Coumadin (PE with therapeutic INR). Now on Xarelto   Colon polyps    Community acquired pneumonia of right lower lobe of lung    COPD (chronic obstructive pulmonary disease) (HCC)    Degenerative disc disease, lumbar    Diabetes mellitus without complication (HCC)    History of pulmonary embolus (PE) 03/26/2017   Hypertension    Mitral regurgitation 12/26/2012   Obesity (BMI 30-39.9) 12/09/2012   PAF (paroxysmal atrial fibrillation) (HCC)    DCCV in 2014 with recurrent PAF in setting of respiratory failure June 2018. S/P DCCV 04/13/17 to NSR   Persistent atrial fibrillation (HCC)    Pneumonia ~ 2016   Sepsis (HCC) 03/10/2017   Sleep apnea    Systolic murmur    Type II diabetes mellitus (HCC)    Past Surgical History:  Procedure Laterality Date   CARDIOVERSION N/A 01/09/2013   Procedure: CARDIOVERSION;  Surgeon: Pricilla Riffle, MD;  Location[facility] ENDOSCOPY;  Service: Cardiovascular;  Laterality: N/A;   CARDIOVERSION N/A 04/13/2017   Procedure: CARDIOVERSION;  Surgeon: Quintella Reichert, MD;  Location: [facility] ENDOSCOPY;  Service: Cardiovascular;  Laterality: N/A;   CARDIOVERSION N/A 09/01/2019   Procedure: CARDIOVERSION;  Surgeon: Jake Bathe, MD;  Location: [facility] ENDOSCOPY;  Service: Cardiovascular;  Laterality: N/A;   TEE WITHOUT CARDIOVERSION N/A 01/09/2013   Procedure: TRANSESOPHAGEAL ECHOCARDIOGRAM (TEE);  Surgeon: Pricilla Riffle, MD;  Location: [facility] ENDOSCOPY;  Service: Cardiovascular;  Laterality: N/A;   Current Outpatient  Medications on File Prior to Visit  Medication Sig Dispense Refill   acetaminophen (TYLENOL) 500 MG tablet Take 1,000 mg by mouth 2 (two) times daily as needed (pain).     amiodarone (PACERONE) 200 MG tablet Take 1 tablet (200 mg total) by mouth daily. Start after finished the 400 mg tabs, in 2 weeks. 30 tablet 5   Dulaglutide (TRULICITY) 1.5 MG/0.5ML SOPN INJECT 1.5 MG INTO THE SKIN ONCE A WEEK (Patient taking differently: Inject 1.5 mg into the skin every Friday. ) 4 pen 3   furosemide (LASIX) 20 MG tablet Take 2 tablets (40 mg total) by mouth 2 (two) times daily. 90 tablet 3   metoprolol tartrate (LOPRESSOR) 50 MG tablet Take 1 tablet by mouth twice daily (Patient taking differently: Take 50 mg by mouth 2 (two) times daily. ) 180 tablet  0   OXYGEN Inhale 2 L into the lungs continuous.     potassium chloride SA (KLOR-CON) 20 MEQ tablet Take 2 tablets (40 mEq total) by mouth daily. 30 tablet 3   pravastatin (PRAVACHOL) 20 MG tablet Take 1 tablet (20 mg total) by mouth daily. 90 tablet 3   rivaroxaban (XARELTO) 20 MG TABS tablet TAKE 1 TABLET BY MOUTH ONCE DAILY WITH SUPPER (Patient taking differently: Take 20 mg by mouth daily with supper. ) 90 tablet 3   sitaGLIPtin (JANUVIA) 100 MG tablet Take 1 tablet (100 mg total) by mouth daily. (Patient taking differently: Take 100 mg by mouth daily with supper. ) 90 tablet 2   No current facility-administered medications on file prior to visit.   No Known Allergies Social History   Socioeconomic History   Marital status: Married    Spouse name: Not on file   Number of children: 2   Years of education: Not on file   Highest education level: Not on file  Occupational History    Comment: Machinist  Tobacco Use   Smoking status: Former Smoker    Packs/day: 1.00    Years: 43.00    Pack years: 43.00    Types: Cigarettes    Quit date: 03/08/2017    Years since quitting: 2.6   Smokeless tobacco: Never Used  Substance and Sexual  Activity   Alcohol use: No    Comment: Rare   Drug use: No   Sexual activity: Not Currently  Other Topics Concern   Not on file  Social History Narrative   Not on file   Social Determinants of Health   Financial Resource Strain:    Difficulty of Paying Living Expenses: Not on file  Food Insecurity: No Food Insecurity   Worried About Running Out of Food in the Last Year: Never true   Ran Out of Food in the Last Year: Never true  Transportation Needs: No Transportation Needs   Lack of Transportation (Medical): No   Lack of Transportation (Non-Medical): No  Physical Activity:    Days of Exercise per Week: Not on file   Minutes of Exercise per Session: Not on file  Stress:    Feeling of Stress : Not on file  Social Connections:    Frequency of Communication with Friends and Family: Not on file   Frequency of Social Gatherings with Friends and Family: Not on file   Attends Religious Services: Not on file   Active Member of Clubs or Organizations: Not on file   Attends Banker Meetings: Not on file   Marital Status: Not on file  Intimate Partner Violence:    Fear of Current or Ex-Partner: Not on file   Emotionally Abused: Not on file   Physically Abused: Not on file   Sexually Abused: Not on file     Review of Systems  All other systems reviewed and are negative.      Objective:   Physical Exam Vitals reviewed.  Constitutional:      General: He is not in acute distress.    Appearance: He is morbidly obese. He is not toxic-appearing or diaphoretic.  Cardiovascular:     Rate and Rhythm: Normal rate and regular rhythm.     Heart sounds: Normal heart sounds.  Pulmonary:     Effort: Pulmonary effort is normal. No respiratory distress.     Breath sounds: No wheezing or rales.  Abdominal:     General: There is no distension.  Palpations: Abdomen is soft.     Tenderness: There is no abdominal tenderness. There is no guarding or  rebound.  Musculoskeletal:     Right lower leg: Edema present.     Left lower leg: Edema present.  Skin:    Findings: Lesion present.  Neurological:     Mental Status: He is alert.           Assessment & Plan:   Venous stasis ulcer of calf, unspecified laterality, unspecified ulcer stage, unspecified whether varicose veins present (HCC) - Plan: BASIC METABOLIC PANEL WITH GFR  Unna boots were reapplied to both legs from the foot to the knee in typical fashion.  The lesions that were bleeding were covered with Silvadene and nonadherent gauze on the right leg.  The shallow ulcers on the dorsum of the first and second toes of the left foot were covered with an Unna boot gauze, covered with nonadherent gauze and then wrapped with absorbable gauze.  This was wrapped separately from the [facility] boot so that it can be changed twice a day to remove the drainage.  This will need to heal through secondary intention.  Check a BMP today.

## 2019-10-28 LAB — BASIC METABOLIC PANEL WITH GFR
BUN/Creatinine Ratio: 23 (calc) — ABNORMAL HIGH (ref 6–22)
BUN: 39 mg/dL — ABNORMAL HIGH (ref 7–25)
CO2: 42 mmol/L — ABNORMAL HIGH (ref 20–32)
Calcium: 9.1 mg/dL (ref 8.6–10.3)
Chloride: 84 mmol/L — ABNORMAL LOW (ref 98–110)
Creat: 1.7 mg/dL — ABNORMAL HIGH (ref 0.70–1.25)
GFR, Est African American: 48 mL/min/{1.73_m2} — ABNORMAL LOW (ref >=60)
GFR, Est Non African American: 41 mL/min/{1.73_m2} — ABNORMAL LOW (ref >=60)
Glucose, Bld: 141 mg/dL — ABNORMAL HIGH (ref 65–99)
Potassium: 3.1 mmol/L — ABNORMAL LOW (ref 3.5–5.3)
Sodium: 135 mmol/L (ref 135–146)

## 2019-10-29 ENCOUNTER — Other Ambulatory Visit (HOSPITAL_COMMUNITY): Payer: Medicare HMO

## 2019-10-29 ENCOUNTER — Inpatient Hospital Stay (HOSPITAL_COMMUNITY): Payer: Medicare HMO

## 2019-10-29 ENCOUNTER — Emergency Department (HOSPITAL_COMMUNITY): Payer: Medicare HMO

## 2019-10-29 ENCOUNTER — Inpatient Hospital Stay (HOSPITAL_COMMUNITY)
Admission: EM | Admit: 2019-10-29 | Discharge: 2019-11-20 | DRG: 871 | Disposition: E | Payer: Medicare HMO | Attending: Specialist | Admitting: Specialist

## 2019-10-29 ENCOUNTER — Encounter (HOSPITAL_COMMUNITY): Payer: Self-pay | Admitting: Specialist

## 2019-10-29 ENCOUNTER — Other Ambulatory Visit: Payer: Self-pay

## 2019-10-29 DIAGNOSIS — J9622 Acute and chronic respiratory failure with hypercapnia: Secondary | ICD-10-CM | POA: Diagnosis not present

## 2019-10-29 DIAGNOSIS — J690 Pneumonitis due to inhalation of food and vomit: Secondary | ICD-10-CM | POA: Diagnosis present

## 2019-10-29 DIAGNOSIS — R34 Anuria and oliguria: Secondary | ICD-10-CM | POA: Diagnosis present

## 2019-10-29 DIAGNOSIS — Z823 Family history of stroke: Secondary | ICD-10-CM

## 2019-10-29 DIAGNOSIS — J9691 Respiratory failure, unspecified with hypoxia: Secondary | ICD-10-CM | POA: Diagnosis not present

## 2019-10-29 DIAGNOSIS — Z66 Do not resuscitate: Secondary | ICD-10-CM | POA: Diagnosis present

## 2019-10-29 DIAGNOSIS — I081 Rheumatic disorders of both mitral and tricuspid valves: Secondary | ICD-10-CM | POA: Diagnosis present

## 2019-10-29 DIAGNOSIS — I872 Venous insufficiency (chronic) (peripheral): Secondary | ICD-10-CM | POA: Diagnosis present

## 2019-10-29 DIAGNOSIS — Z87891 Personal history of nicotine dependence: Secondary | ICD-10-CM

## 2019-10-29 DIAGNOSIS — J9601 Acute respiratory failure with hypoxia: Secondary | ICD-10-CM | POA: Diagnosis not present

## 2019-10-29 DIAGNOSIS — Z7901 Long term (current) use of anticoagulants: Secondary | ICD-10-CM

## 2019-10-29 DIAGNOSIS — Z515 Encounter for palliative care: Secondary | ICD-10-CM | POA: Diagnosis not present

## 2019-10-29 DIAGNOSIS — I4819 Other persistent atrial fibrillation: Secondary | ICD-10-CM | POA: Diagnosis present

## 2019-10-29 DIAGNOSIS — R918 Other nonspecific abnormal finding of lung field: Secondary | ICD-10-CM | POA: Diagnosis not present

## 2019-10-29 DIAGNOSIS — I4510 Unspecified right bundle-branch block: Secondary | ICD-10-CM | POA: Diagnosis not present

## 2019-10-29 DIAGNOSIS — Z9911 Dependence on respirator [ventilator] status: Secondary | ICD-10-CM | POA: Diagnosis not present

## 2019-10-29 DIAGNOSIS — Z86711 Personal history of pulmonary embolism: Secondary | ICD-10-CM

## 2019-10-29 DIAGNOSIS — Z452 Encounter for adjustment and management of vascular access device: Secondary | ICD-10-CM

## 2019-10-29 DIAGNOSIS — Z7984 Long term (current) use of oral hypoglycemic drugs: Secondary | ICD-10-CM

## 2019-10-29 DIAGNOSIS — I5032 Chronic diastolic (congestive) heart failure: Secondary | ICD-10-CM | POA: Diagnosis present

## 2019-10-29 DIAGNOSIS — E874 Mixed disorder of acid-base balance: Secondary | ICD-10-CM | POA: Diagnosis present

## 2019-10-29 DIAGNOSIS — Z6841 Body Mass Index (BMI) 40.0 and over, adult: Secondary | ICD-10-CM

## 2019-10-29 DIAGNOSIS — J80 Acute respiratory distress syndrome: Secondary | ICD-10-CM | POA: Diagnosis not present

## 2019-10-29 DIAGNOSIS — N179 Acute kidney failure, unspecified: Secondary | ICD-10-CM | POA: Diagnosis not present

## 2019-10-29 DIAGNOSIS — Z9981 Dependence on supplemental oxygen: Secondary | ICD-10-CM | POA: Diagnosis not present

## 2019-10-29 DIAGNOSIS — J449 Chronic obstructive pulmonary disease, unspecified: Secondary | ICD-10-CM | POA: Diagnosis present

## 2019-10-29 DIAGNOSIS — I110 Hypertensive heart disease with heart failure: Secondary | ICD-10-CM | POA: Diagnosis present

## 2019-10-29 DIAGNOSIS — R404 Transient alteration of awareness: Secondary | ICD-10-CM | POA: Diagnosis not present

## 2019-10-29 DIAGNOSIS — E662 Morbid (severe) obesity with alveolar hypoventilation: Secondary | ICD-10-CM | POA: Diagnosis present

## 2019-10-29 DIAGNOSIS — I482 Chronic atrial fibrillation, unspecified: Secondary | ICD-10-CM | POA: Diagnosis not present

## 2019-10-29 DIAGNOSIS — Z20822 Contact with and (suspected) exposure to covid-19: Secondary | ICD-10-CM | POA: Diagnosis not present

## 2019-10-29 DIAGNOSIS — Z8701 Personal history of pneumonia (recurrent): Secondary | ICD-10-CM

## 2019-10-29 DIAGNOSIS — Z8249 Family history of ischemic heart disease and other diseases of the circulatory system: Secondary | ICD-10-CM

## 2019-10-29 DIAGNOSIS — I428 Other cardiomyopathies: Secondary | ICD-10-CM | POA: Diagnosis present

## 2019-10-29 DIAGNOSIS — E1165 Type 2 diabetes mellitus with hyperglycemia: Secondary | ICD-10-CM | POA: Diagnosis not present

## 2019-10-29 DIAGNOSIS — R4020 Unspecified coma: Secondary | ICD-10-CM | POA: Diagnosis not present

## 2019-10-29 DIAGNOSIS — M5136 Other intervertebral disc degeneration, lumbar region: Secondary | ICD-10-CM | POA: Diagnosis present

## 2019-10-29 DIAGNOSIS — Z86010 Personal history of colonic polyps: Secondary | ICD-10-CM

## 2019-10-29 DIAGNOSIS — R6521 Severe sepsis with septic shock: Secondary | ICD-10-CM | POA: Diagnosis not present

## 2019-10-29 DIAGNOSIS — K828 Other specified diseases of gallbladder: Secondary | ICD-10-CM | POA: Diagnosis not present

## 2019-10-29 DIAGNOSIS — A419 Sepsis, unspecified organism: Principal | ICD-10-CM

## 2019-10-29 DIAGNOSIS — R4182 Altered mental status, unspecified: Secondary | ICD-10-CM | POA: Diagnosis not present

## 2019-10-29 DIAGNOSIS — Z8619 Personal history of other infectious and parasitic diseases: Secondary | ICD-10-CM

## 2019-10-29 DIAGNOSIS — I7025 Atherosclerosis of native arteries of other extremities with ulceration: Secondary | ICD-10-CM | POA: Diagnosis not present

## 2019-10-29 DIAGNOSIS — R6520 Severe sepsis without septic shock: Secondary | ICD-10-CM | POA: Diagnosis not present

## 2019-10-29 DIAGNOSIS — R188 Other ascites: Secondary | ICD-10-CM | POA: Diagnosis not present

## 2019-10-29 DIAGNOSIS — R627 Adult failure to thrive: Secondary | ICD-10-CM | POA: Diagnosis present

## 2019-10-29 DIAGNOSIS — I509 Heart failure, unspecified: Secondary | ICD-10-CM | POA: Diagnosis not present

## 2019-10-29 DIAGNOSIS — E1152 Type 2 diabetes mellitus with diabetic peripheral angiopathy with gangrene: Secondary | ICD-10-CM | POA: Diagnosis present

## 2019-10-29 DIAGNOSIS — R791 Abnormal coagulation profile: Secondary | ICD-10-CM | POA: Diagnosis present

## 2019-10-29 DIAGNOSIS — I4891 Unspecified atrial fibrillation: Secondary | ICD-10-CM | POA: Diagnosis not present

## 2019-10-29 DIAGNOSIS — R0902 Hypoxemia: Secondary | ICD-10-CM | POA: Diagnosis not present

## 2019-10-29 DIAGNOSIS — Z803 Family history of malignant neoplasm of breast: Secondary | ICD-10-CM

## 2019-10-29 DIAGNOSIS — Z801 Family history of malignant neoplasm of trachea, bronchus and lung: Secondary | ICD-10-CM

## 2019-10-29 DIAGNOSIS — R092 Respiratory arrest: Secondary | ICD-10-CM | POA: Diagnosis not present

## 2019-10-29 DIAGNOSIS — Z4682 Encounter for fitting and adjustment of non-vascular catheter: Secondary | ICD-10-CM | POA: Diagnosis not present

## 2019-10-29 DIAGNOSIS — Z8679 Personal history of other diseases of the circulatory system: Secondary | ICD-10-CM

## 2019-10-29 LAB — HEMOGLOBIN A1C
Hgb A1c MFr Bld: 6.5 % — ABNORMAL HIGH (ref 4.8–5.6)
Mean Plasma Glucose: 139.85 mg/dL

## 2019-10-29 LAB — URINALYSIS, COMPLETE (UACMP) WITH MICROSCOPIC
Bilirubin Urine: NEGATIVE
Glucose, UA: NEGATIVE mg/dL
Hgb urine dipstick: NEGATIVE
Ketones, ur: NEGATIVE mg/dL
Leukocytes,Ua: NEGATIVE
Nitrite: NEGATIVE
Protein, ur: 100 mg/dL — AB
Specific Gravity, Urine: 1.011 (ref 1.005–1.030)
pH: 6 (ref 5.0–8.0)

## 2019-10-29 LAB — RAPID URINE DRUG SCREEN, HOSP PERFORMED
Amphetamines: NOT DETECTED
Barbiturates: NOT DETECTED
Benzodiazepines: NOT DETECTED
Cocaine: NOT DETECTED
Opiates: NOT DETECTED
Tetrahydrocannabinol: NOT DETECTED

## 2019-10-29 LAB — COMPREHENSIVE METABOLIC PANEL
ALT: 11 U/L (ref 0–44)
AST: 23 U/L (ref 15–41)
Albumin: 3 g/dL — ABNORMAL LOW (ref 3.5–5.0)
Alkaline Phosphatase: 100 U/L (ref 38–126)
Anion gap: 15 (ref 5–15)
BUN: 45 mg/dL — ABNORMAL HIGH (ref 8–23)
CO2: 36 mmol/L — ABNORMAL HIGH (ref 22–32)
Calcium: 8.6 mg/dL — ABNORMAL LOW (ref 8.9–10.3)
Chloride: 84 mmol/L — ABNORMAL LOW (ref 98–111)
Creatinine, Ser: 2.34 mg/dL — ABNORMAL HIGH (ref 0.61–1.24)
GFR calc Af Amer: 32 mL/min — ABNORMAL LOW (ref >60)
GFR calc non Af Amer: 28 mL/min — ABNORMAL LOW (ref >60)
Glucose, Bld: 152 mg/dL — ABNORMAL HIGH (ref 70–99)
Potassium: 3.4 mmol/L — ABNORMAL LOW (ref 3.5–5.1)
Sodium: 135 mmol/L (ref 135–145)
Total Bilirubin: 1.2 mg/dL (ref 0.3–1.2)
Total Protein: 7.5 g/dL (ref 6.5–8.1)

## 2019-10-29 LAB — POCT I-STAT 7, (LYTES, BLD GAS, ICA,H+H)
Acid-Base Excess: 11 mmol/L — ABNORMAL HIGH (ref 0.0–2.0)
Acid-Base Excess: 9 mmol/L — ABNORMAL HIGH (ref 0.0–2.0)
Acid-Base Excess: 11 mmol/L — ABNORMAL HIGH (ref 0.0–2.0)
Acid-Base Excess: 3 mmol/L — ABNORMAL HIGH (ref 0.0–2.0)
Acid-Base Excess: 5 mmol/L — ABNORMAL HIGH (ref 0.0–2.0)
Bicarbonate: 40 mmol/L — ABNORMAL HIGH (ref 20.0–28.0)
Bicarbonate: 39.5 mmol/L — ABNORMAL HIGH (ref 20.0–28.0)
Bicarbonate: 38.6 mmol/L — ABNORMAL HIGH (ref 20.0–28.0)
Bicarbonate: 30.9 mmol/L — ABNORMAL HIGH (ref 20.0–28.0)
Bicarbonate: 32.1 mmol/L — ABNORMAL HIGH (ref 20.0–28.0)
Calcium, Ion: 1.1 mmol/L — ABNORMAL LOW (ref 1.15–1.40)
Calcium, Ion: 1.1 mmol/L — ABNORMAL LOW (ref 1.15–1.40)
Calcium, Ion: 1.05 mmol/L — ABNORMAL LOW (ref 1.15–1.40)
Calcium, Ion: 0.93 mmol/L — ABNORMAL LOW (ref 1.15–1.40)
Calcium, Ion: 0.98 mmol/L — ABNORMAL LOW (ref 1.15–1.40)
HCT: 42 % (ref 39.0–52.0)
HCT: 43 % (ref 39.0–52.0)
HCT: 40 % (ref 39.0–52.0)
HCT: 43 % (ref 39.0–52.0)
HCT: 45 % (ref 39.0–52.0)
Hemoglobin: 14.3 g/dL (ref 13.0–17.0)
Hemoglobin: 14.6 g/dL (ref 13.0–17.0)
Hemoglobin: 13.6 g/dL (ref 13.0–17.0)
Hemoglobin: 14.6 g/dL (ref 13.0–17.0)
Hemoglobin: 15.3 g/dL (ref 13.0–17.0)
O2 Saturation: 88 %
O2 Saturation: 96 %
O2 Saturation: 100 %
O2 Saturation: 100 %
O2 Saturation: 52 %
Patient temperature: 103.3
Patient temperature: 102.3
Patient temperature: 100.2
Patient temperature: 38.3
Patient temperature: 38.4
Potassium: 2.8 mmol/L — ABNORMAL LOW (ref 3.5–5.1)
Potassium: 2.9 mmol/L — ABNORMAL LOW (ref 3.5–5.1)
Potassium: 2.8 mmol/L — ABNORMAL LOW (ref 3.5–5.1)
Potassium: 3.3 mmol/L — ABNORMAL LOW (ref 3.5–5.1)
Potassium: 5.2 mmol/L — ABNORMAL HIGH (ref 3.5–5.1)
Sodium: 134 mmol/L — ABNORMAL LOW (ref 135–145)
Sodium: 136 mmol/L (ref 135–145)
Sodium: 133 mmol/L — ABNORMAL LOW (ref 135–145)
Sodium: 133 mmol/L — ABNORMAL LOW (ref 135–145)
Sodium: 134 mmol/L — ABNORMAL LOW (ref 135–145)
TCO2: 42 mmol/L — ABNORMAL HIGH (ref 22–32)
TCO2: 42 mmol/L — ABNORMAL HIGH (ref 22–32)
TCO2: 41 mmol/L — ABNORMAL HIGH (ref 22–32)
TCO2: 33 mmol/L — ABNORMAL HIGH (ref 22–32)
TCO2: 34 mmol/L — ABNORMAL HIGH (ref 22–32)
pCO2 arterial: 83.8 mmHg (ref 32.0–48.0)
pCO2 arterial: 90.1 mmHg (ref 32.0–48.0)
pCO2 arterial: 68.7 mmHg (ref 32.0–48.0)
pCO2 arterial: 59.2 mmHg — ABNORMAL HIGH (ref 32.0–48.0)
pCO2 arterial: 61.1 mmHg — ABNORMAL HIGH (ref 32.0–48.0)
pH, Arterial: 7.299 — ABNORMAL LOW (ref 7.350–7.450)
pH, Arterial: 7.26 — ABNORMAL LOW (ref 7.350–7.450)
pH, Arterial: 7.362 (ref 7.350–7.450)
pH, Arterial: 7.319 — ABNORMAL LOW (ref 7.350–7.450)
pH, Arterial: 7.348 — ABNORMAL LOW (ref 7.350–7.450)
pO2, Arterial: 75 mmHg — ABNORMAL LOW (ref 83.0–108.0)
pO2, Arterial: 112 mmHg — ABNORMAL HIGH (ref 83.0–108.0)
pO2, Arterial: 255 mmHg — ABNORMAL HIGH (ref 83.0–108.0)
pO2, Arterial: 33 mmHg — CL (ref 83.0–108.0)
pO2, Arterial: 337 mmHg — ABNORMAL HIGH (ref 83.0–108.0)

## 2019-10-29 LAB — POCT I-STAT EG7
Acid-Base Excess: 12 mmol/L — ABNORMAL HIGH (ref 0.0–2.0)
Bicarbonate: 41.3 mmol/L — ABNORMAL HIGH (ref 20.0–28.0)
Calcium, Ion: 0.97 mmol/L — ABNORMAL LOW (ref 1.15–1.40)
HCT: 45 % (ref 39.0–52.0)
Hemoglobin: 15.3 g/dL (ref 13.0–17.0)
O2 Saturation: 89 %
Potassium: 3.2 mmol/L — ABNORMAL LOW (ref 3.5–5.1)
Sodium: 133 mmol/L — ABNORMAL LOW (ref 135–145)
TCO2: 44 mmol/L — ABNORMAL HIGH (ref 22–32)
pCO2, Ven: 75.2 mmHg (ref 44.0–60.0)
pH, Ven: 7.348 (ref 7.250–7.430)
pO2, Ven: 63 mmHg — ABNORMAL HIGH (ref 32.0–45.0)

## 2019-10-29 LAB — PROTIME-INR
INR: 3.7 — ABNORMAL HIGH (ref 0.8–1.2)
Prothrombin Time: 36.7 seconds — ABNORMAL HIGH (ref 11.4–15.2)

## 2019-10-29 LAB — POCT I-STAT, CHEM 8
BUN: 49 mg/dL — ABNORMAL HIGH (ref 8–23)
Calcium, Ion: 0.99 mmol/L — ABNORMAL LOW (ref 1.15–1.40)
Chloride: 90 mmol/L — ABNORMAL LOW (ref 98–111)
Creatinine, Ser: 2.8 mg/dL — ABNORMAL HIGH (ref 0.61–1.24)
Glucose, Bld: 126 mg/dL — ABNORMAL HIGH (ref 70–99)
HCT: 47 % (ref 39.0–52.0)
Hemoglobin: 16 g/dL (ref 13.0–17.0)
Potassium: 3.2 mmol/L — ABNORMAL LOW (ref 3.5–5.1)
Sodium: 135 mmol/L (ref 135–145)
TCO2: 30 mmol/L (ref 22–32)

## 2019-10-29 LAB — CBC WITH DIFFERENTIAL/PLATELET
Abs Immature Granulocytes: 0.04 10*3/uL (ref 0.00–0.07)
Basophils Absolute: 0 10*3/uL (ref 0.0–0.1)
Basophils Relative: 0 %
Eosinophils Absolute: 0 10*3/uL (ref 0.0–0.5)
Eosinophils Relative: 0 %
HCT: 44.1 % (ref 39.0–52.0)
Hemoglobin: 12.7 g/dL — ABNORMAL LOW (ref 13.0–17.0)
Immature Granulocytes: 0 %
Lymphocytes Relative: 3 %
Lymphs Abs: 0.3 10*3/uL — ABNORMAL LOW (ref 0.7–4.0)
MCH: 25.3 pg — ABNORMAL LOW (ref 26.0–34.0)
MCHC: 28.8 g/dL — ABNORMAL LOW (ref 30.0–36.0)
MCV: 87.8 fL (ref 80.0–100.0)
Monocytes Absolute: 0.1 10*3/uL (ref 0.1–1.0)
Monocytes Relative: 1 %
Neutro Abs: 10.1 10*3/uL — ABNORMAL HIGH (ref 1.7–7.7)
Neutrophils Relative %: 96 %
Platelets: 373 10*3/uL (ref 150–400)
RBC: 5.02 MIL/uL (ref 4.22–5.81)
RDW: 21.6 % — ABNORMAL HIGH (ref 11.5–15.5)
WBC Morphology: INCREASED
WBC: 10.5 10*3/uL (ref 4.0–10.5)
nRBC: 0 % (ref 0.0–0.2)

## 2019-10-29 LAB — I-STAT CHEM 8, ED
BUN: 48 mg/dL — ABNORMAL HIGH (ref 8–23)
Calcium, Ion: 0.98 mmol/L — ABNORMAL LOW (ref 1.15–1.40)
Chloride: 83 mmol/L — ABNORMAL LOW (ref 98–111)
Creatinine, Ser: 2.3 mg/dL — ABNORMAL HIGH (ref 0.61–1.24)
Glucose, Bld: 155 mg/dL — ABNORMAL HIGH (ref 70–99)
HCT: 45 % (ref 39.0–52.0)
Hemoglobin: 15.3 g/dL (ref 13.0–17.0)
Potassium: 3.2 mmol/L — ABNORMAL LOW (ref 3.5–5.1)
Sodium: 133 mmol/L — ABNORMAL LOW (ref 135–145)
TCO2: 40 mmol/L — ABNORMAL HIGH (ref 22–32)

## 2019-10-29 LAB — GLUCOSE, CAPILLARY
Glucose-Capillary: 130 mg/dL — ABNORMAL HIGH (ref 70–99)
Glucose-Capillary: 121 mg/dL — ABNORMAL HIGH (ref 70–99)
Glucose-Capillary: 140 mg/dL — ABNORMAL HIGH (ref 70–99)

## 2019-10-29 LAB — COOXEMETRY PANEL
Carboxyhemoglobin: 2.2 % — ABNORMAL HIGH (ref 0.5–1.5)
Methemoglobin: 0.9 % (ref 0.0–1.5)
O2 Saturation: 73.8 %
Total hemoglobin: 12.3 g/dL (ref 12.0–16.0)

## 2019-10-29 LAB — LACTIC ACID, PLASMA
Lactic Acid, Venous: 3.4 mmol/L (ref 0.5–1.9)
Lactic Acid, Venous: 4.3 mmol/L (ref 0.5–1.9)

## 2019-10-29 LAB — RESPIRATORY PANEL BY RT PCR (FLU A&B, COVID)
Influenza A by PCR: NEGATIVE
Influenza B by PCR: NEGATIVE
SARS Coronavirus 2 by RT PCR: NEGATIVE

## 2019-10-29 LAB — TYPE AND SCREEN
ABO/RH(D): O POS
Antibody Screen: NEGATIVE

## 2019-10-29 LAB — CBG MONITORING, ED: Glucose-Capillary: 127 mg/dL — ABNORMAL HIGH (ref 70–99)

## 2019-10-29 LAB — PROCALCITONIN: Procalcitonin: 5.51 ng/mL

## 2019-10-29 LAB — ABO/RH: ABO/RH(D): O POS

## 2019-10-29 LAB — TROPONIN I (HIGH SENSITIVITY)
Troponin I (High Sensitivity): 77 ng/L — ABNORMAL HIGH (ref <18)
Troponin I (High Sensitivity): 70 ng/L — ABNORMAL HIGH (ref <18)

## 2019-10-29 LAB — MRSA PCR SCREENING: MRSA by PCR: POSITIVE — AB

## 2019-10-29 LAB — APTT: aPTT: 51 seconds — ABNORMAL HIGH (ref 24–36)

## 2019-10-29 LAB — FIBRINOGEN: Fibrinogen: 448 mg/dL (ref 210–475)

## 2019-10-29 LAB — BRAIN NATRIURETIC PEPTIDE: B Natriuretic Peptide: 686 pg/mL — ABNORMAL HIGH (ref 0.0–100.0)

## 2019-10-29 LAB — ETHANOL: Alcohol, Ethyl (B): <10 mg/dL (ref <10)

## 2019-10-29 LAB — TRIGLYCERIDES: Triglycerides: 50 mg/dL (ref <150)

## 2019-10-29 LAB — AMMONIA: Ammonia: 102 umol/L — ABNORMAL HIGH (ref 9–35)

## 2019-10-29 MED ORDER — SODIUM CHLORIDE 0.9 % IV SOLN
2.0000 g | Freq: Once | INTRAVENOUS | Status: AC
  Administered 2019-10-29: 2 g via INTRAVENOUS
  Filled 2019-10-29: qty 2

## 2019-10-29 MED ORDER — CHLORHEXIDINE GLUCONATE CLOTH 2 % EX PADS: 6.0000 | MEDICATED_PAD | Freq: Every day | CUTANEOUS | Status: DC

## 2019-10-29 MED ORDER — GLYCOPYRROLATE 0.2 MG/ML IJ SOLN: 0.2000 mg | INTRAMUSCULAR | Status: DC | PRN

## 2019-10-29 MED ORDER — FENTANYL BOLUS VIA INFUSION
25.0000 ug | INTRAVENOUS | Status: DC | PRN
  Filled 2019-10-29: qty 25

## 2019-10-29 MED ORDER — GLYCOPYRROLATE 1 MG PO TABS
1.0000 mg | ORAL_TABLET | ORAL | Status: DC | PRN
  Filled 2019-10-29: qty 1

## 2019-10-29 MED ORDER — SODIUM CHLORIDE 0.9 % IV SOLN
2.0000 g | INTRAVENOUS | Status: DC
  Filled 2019-10-29: qty 2

## 2019-10-29 MED ORDER — DEXTROSE 5 % IV SOLN: INTRAVENOUS | Status: DC

## 2019-10-29 MED ORDER — ACETAMINOPHEN 650 MG RE SUPP
650.0000 mg | Freq: Once | RECTAL | Status: AC
  Administered 2019-10-29: 650 mg via RECTAL
  Filled 2019-10-29: qty 1

## 2019-10-29 MED ORDER — POTASSIUM CHLORIDE 10 MEQ/50ML IV SOLN
10.0000 meq | INTRAVENOUS | Status: DC
  Administered 2019-10-29: 10 meq via INTRAVENOUS
  Filled 2019-10-29 (×3): qty 50

## 2019-10-29 MED ORDER — ACETAMINOPHEN 650 MG RE SUPP: 650.0000 mg | Freq: Four times a day (QID) | RECTAL | Status: DC | PRN

## 2019-10-29 MED ORDER — ACETAMINOPHEN 325 MG PO TABS: 650.0000 mg | ORAL_TABLET | Freq: Four times a day (QID) | ORAL | Status: DC | PRN

## 2019-10-29 MED ORDER — MIDAZOLAM HCL 2 MG/2ML IJ SOLN
INTRAMUSCULAR | Status: AC
  Administered 2019-10-29: 2 mg via INTRAVENOUS
  Filled 2019-10-29: qty 2

## 2019-10-29 MED ORDER — VASOPRESSIN 20 UNIT/ML IV SOLN
0.0300 [IU]/min | INTRAVENOUS | Status: DC
  Administered 2019-10-29: 0.03 [IU]/min via INTRAVENOUS
  Filled 2019-10-29: qty 2

## 2019-10-29 MED ORDER — DOBUTAMINE IN D5W 4-5 MG/ML-% IV SOLN: 2.5000 ug/kg/min | INTRAVENOUS | Status: DC

## 2019-10-29 MED ORDER — SODIUM CHLORIDE 0.9% FLUSH: 10.0000 mL | INTRAVENOUS | Status: DC | PRN

## 2019-10-29 MED ORDER — SODIUM CHLORIDE 0.9% FLUSH: 10.0000 mL | Freq: Two times a day (BID) | INTRAVENOUS | Status: DC

## 2019-10-29 MED ORDER — POTASSIUM CHLORIDE 10 MEQ/100ML IV SOLN
10.0000 meq | Freq: Once | INTRAVENOUS | Status: AC
  Administered 2019-10-29: 10 meq via INTRAVENOUS
  Filled 2019-10-29: qty 100

## 2019-10-29 MED ORDER — LACTATED RINGERS IV BOLUS
1000.0000 mL | Freq: Once | INTRAVENOUS | Status: AC
  Administered 2019-10-29: 1000 mL via INTRAVENOUS

## 2019-10-29 MED ORDER — ETOMIDATE 2 MG/ML IV SOLN
INTRAVENOUS | Status: AC | PRN
  Administered 2019-10-29: 30 mg via INTRAVENOUS

## 2019-10-29 MED ORDER — AMIODARONE HCL 200 MG PO TABS: 200.0000 mg | ORAL_TABLET | Freq: Every day | ORAL | Status: DC

## 2019-10-29 MED ORDER — FUROSEMIDE 10 MG/ML IJ SOLN
100.0000 mg | Freq: Once | INTRAVENOUS | Status: AC
  Administered 2019-10-29: 100 mg via INTRAVENOUS
  Filled 2019-10-29: qty 10

## 2019-10-29 MED ORDER — FENTANYL CITRATE (PF) 100 MCG/2ML IJ SOLN
50.0000 ug | Freq: Once | INTRAMUSCULAR | Status: AC
  Administered 2019-10-29: 50 ug via INTRAVENOUS
  Filled 2019-10-29: qty 2

## 2019-10-29 MED ORDER — AMIODARONE HCL IN DEXTROSE 360-4.14 MG/200ML-% IV SOLN
30.0000 mg/h | INTRAVENOUS | Status: DC
  Administered 2019-10-29: 30 mg/h via INTRAVENOUS

## 2019-10-29 MED ORDER — SODIUM CHLORIDE 0.9% IV SOLUTION: Freq: Once | INTRAVENOUS | Status: AC

## 2019-10-29 MED ORDER — AMIODARONE HCL IN DEXTROSE 360-4.14 MG/200ML-% IV SOLN: 30.0000 mg/h | INTRAVENOUS | Status: DC

## 2019-10-29 MED ORDER — MORPHINE BOLUS VIA INFUSION
5.0000 mg | INTRAVENOUS | Status: DC | PRN
  Filled 2019-10-29: qty 5

## 2019-10-29 MED ORDER — FENTANYL 2500MCG IN NS 250ML (10MCG/ML) PREMIX INFUSION
25.0000 ug/h | INTRAVENOUS | Status: DC
  Administered 2019-10-29: 50 ug/h via INTRAVENOUS
  Filled 2019-10-29: qty 250

## 2019-10-29 MED ORDER — VANCOMYCIN HCL 10 G IV SOLR: 2250.0000 mg | INTRAVENOUS | Status: DC

## 2019-10-29 MED ORDER — PHENYLEPHRINE HCL-NACL 10-0.9 MG/250ML-% IV SOLN
0.0000 ug/min | INTRAVENOUS | Status: DC
  Administered 2019-10-29 (×3): 100 ug/min via INTRAVENOUS
  Administered 2019-10-29: 20 ug/min via INTRAVENOUS
  Filled 2019-10-29 (×5): qty 250

## 2019-10-29 MED ORDER — SODIUM CHLORIDE 0.9 % IV BOLUS
1000.0000 mL | Freq: Once | INTRAVENOUS | Status: AC
  Administered 2019-10-29: 1000 mL via INTRAVENOUS

## 2019-10-29 MED ORDER — ORAL CARE MOUTH RINSE: 15.0000 mL | OROMUCOSAL | Status: DC

## 2019-10-29 MED ORDER — NOREPINEPHRINE 16 MG/250ML-% IV SOLN
0.0000 ug/min | INTRAVENOUS | Status: DC
  Administered 2019-10-29: 20 ug/min via INTRAVENOUS
  Filled 2019-10-29 (×2): qty 250

## 2019-10-29 MED ORDER — AMIODARONE HCL IN DEXTROSE 360-4.14 MG/200ML-% IV SOLN: 60.0000 mg/h | INTRAVENOUS | Status: DC

## 2019-10-29 MED ORDER — METRONIDAZOLE IN NACL 5-0.79 MG/ML-% IV SOLN
500.0000 mg | Freq: Three times a day (TID) | INTRAVENOUS | Status: DC
  Administered 2019-10-29: 500 mg via INTRAVENOUS
  Filled 2019-10-29: qty 100

## 2019-10-29 MED ORDER — MORPHINE SULFATE (PF) 2 MG/ML IV SOLN: 2.0000 mg | INTRAVENOUS | Status: DC | PRN

## 2019-10-29 MED ORDER — INSULIN ASPART 100 UNIT/ML ~~LOC~~ SOLN
0.0000 [IU] | SUBCUTANEOUS | Status: DC
  Administered 2019-10-29 (×3): 3 [IU] via SUBCUTANEOUS

## 2019-10-29 MED ORDER — CHLORHEXIDINE GLUCONATE 0.12% ORAL RINSE (MEDLINE KIT): 15.0000 mL | Freq: Two times a day (BID) | OROMUCOSAL | Status: DC

## 2019-10-29 MED ORDER — POLYVINYL ALCOHOL 1.4 % OP SOLN
1.0000 [drp] | Freq: Four times a day (QID) | OPHTHALMIC | Status: DC | PRN
  Filled 2019-10-29: qty 15

## 2019-10-29 MED ORDER — MIDAZOLAM HCL 2 MG/2ML IJ SOLN
1.0000 mg | INTRAMUSCULAR | Status: DC | PRN
  Administered 2019-10-29 (×2): 1 mg via INTRAVENOUS
  Filled 2019-10-29: qty 2

## 2019-10-29 MED ORDER — HYDROCORTISONE NA SUCCINATE PF 100 MG IJ SOLR
50.0000 mg | Freq: Four times a day (QID) | INTRAMUSCULAR | Status: DC
  Administered 2019-10-29: 50 mg via INTRAVENOUS
  Filled 2019-10-29 (×2): qty 2

## 2019-10-29 MED ORDER — PROPOFOL 1000 MG/100ML IV EMUL: 0.0000 ug/kg/min | INTRAVENOUS | Status: DC

## 2019-10-29 MED ORDER — CLINDAMYCIN PHOSPHATE 900 MG/50ML IV SOLN
900.0000 mg | Freq: Four times a day (QID) | INTRAVENOUS | Status: DC
  Administered 2019-10-29: 900 mg via INTRAVENOUS
  Filled 2019-10-29 (×3): qty 50

## 2019-10-29 MED ORDER — ACETAMINOPHEN 160 MG/5ML PO SOLN
650.0000 mg | ORAL | Status: DC | PRN
  Administered 2019-10-29: 650 mg
  Filled 2019-10-29: qty 20.3

## 2019-10-29 MED ORDER — MIDAZOLAM HCL 2 MG/2ML IJ SOLN
INTRAMUSCULAR | Status: AC
  Filled 2019-10-29: qty 2

## 2019-10-29 MED ORDER — DOBUTAMINE IN D5W 4-5 MG/ML-% IV SOLN
2.5000 ug/kg/min | INTRAVENOUS | Status: DC
  Administered 2019-10-29: 2.5 ug/kg/min via INTRAVENOUS

## 2019-10-29 MED ORDER — AMIODARONE HCL IN DEXTROSE 360-4.14 MG/200ML-% IV SOLN
60.0000 mg/h | INTRAVENOUS | Status: DC
  Administered 2019-10-29: 60 mg/h via INTRAVENOUS
  Filled 2019-10-29 (×2): qty 200

## 2019-10-29 MED ORDER — NOREPINEPHRINE 4 MG/250ML-% IV SOLN
0.0000 ug/min | INTRAVENOUS | Status: DC
  Administered 2019-10-29 (×2): 32 ug/min via INTRAVENOUS
  Administered 2019-10-29: 2 ug/min via INTRAVENOUS
  Filled 2019-10-29 (×3): qty 250

## 2019-10-29 MED ORDER — POTASSIUM CHLORIDE 10 MEQ/100ML IV SOLN: 10.0000 meq | INTRAVENOUS | Status: DC

## 2019-10-29 MED ORDER — MIDAZOLAM HCL 2 MG/2ML IJ SOLN
1.0000 mg | INTRAMUSCULAR | Status: DC | PRN
  Administered 2019-10-29 (×2): 2 mg via INTRAVENOUS
  Administered 2019-10-29 (×2): 1 mg via INTRAVENOUS
  Filled 2019-10-29 (×3): qty 2

## 2019-10-29 MED ORDER — MAGNESIUM SULFATE 2 GM/50ML IV SOLN
2.0000 g | Freq: Once | INTRAVENOUS | Status: AC
  Administered 2019-10-29: 2 g via INTRAVENOUS
  Filled 2019-10-29: qty 50

## 2019-10-29 MED ORDER — POTASSIUM CHLORIDE 20 MEQ/15ML (10%) PO SOLN
40.0000 meq | Freq: Once | ORAL | Status: AC
  Administered 2019-10-29: 40 meq
  Filled 2019-10-29: qty 30

## 2019-10-29 MED ORDER — MIDAZOLAM HCL 2 MG/2ML IJ SOLN: 2.0000 mg | INTRAMUSCULAR | Status: DC | PRN

## 2019-10-29 MED ORDER — SODIUM CHLORIDE 0.9 % IV SOLN: INTRAVENOUS | Status: DC | PRN

## 2019-10-29 MED ORDER — VANCOMYCIN HCL 10 G IV SOLR
2500.0000 mg | Freq: Once | INTRAVENOUS | Status: AC
  Administered 2019-10-29: 2500 mg via INTRAVENOUS
  Filled 2019-10-29: qty 2500

## 2019-10-29 MED ORDER — DIPHENHYDRAMINE HCL 50 MG/ML IJ SOLN: 25.0000 mg | INTRAMUSCULAR | Status: DC | PRN

## 2019-10-29 MED ORDER — ROCURONIUM BROMIDE 50 MG/5ML IV SOLN
INTRAVENOUS | Status: AC | PRN
  Administered 2019-10-29: 100 mg via INTRAVENOUS

## 2019-10-29 MED ORDER — MORPHINE 100MG IN NS 100ML (1MG/ML) PREMIX INFUSION
0.0000 mg/h | INTRAVENOUS | Status: DC
  Administered 2019-10-29: 10 mg/h via INTRAVENOUS
  Filled 2019-10-29: qty 100

## 2019-10-29 MED ORDER — MIDAZOLAM HCL 2 MG/2ML IJ SOLN: 1.0000 mg | INTRAMUSCULAR | Status: DC | PRN

## 2019-10-30 LAB — BPAM FFP
Blood Product Expiration Date: 202102092359
Blood Product Expiration Date: 202102112359
ISSUE DATE / TIME: 202102071008
ISSUE DATE / TIME: 202102071008
Unit Type and Rh: 6200
Unit Type and Rh: 6200

## 2019-10-30 LAB — URINE CULTURE: Culture: NO GROWTH

## 2019-10-30 LAB — PREPARE FRESH FROZEN PLASMA
Unit division: 0
Unit division: 0

## 2019-10-31 LAB — CULTURE, BLOOD (ROUTINE X 2)

## 2019-11-03 LAB — CULTURE, BLOOD (ROUTINE X 2): Culture: NO GROWTH

## 2019-11-08 ENCOUNTER — Ambulatory Visit: Payer: HMO | Admitting: Vascular Surgery

## 2019-11-20 NOTE — ED Notes (Signed)
RT bagging pt  

## 2019-11-20 NOTE — Progress Notes (Signed)
I was asked by critical care attending to review the bedside echocardiographic images and to determine if there was a component of cardiogenic shock contributing to the patient's clinical picture.  I also reviewed radiographic data and labs.  Coox 73.8% at 1318 today.  LVEF appears to be about 50% with some mild regional variation towards the interventricular septum.  Right ventricle appears to be enlarged with reduced systolic function.  Tricuspid regurgitation is noted.  Overall picture appears to be consistent with septic shock.  Discussed with critical care attending.

## 2019-11-20 NOTE — ED Notes (Signed)
Wife signed blood informed consent paper form witnessed by nurse.

## 2019-11-20 NOTE — ED Provider Notes (Signed)
Emergency Department Provider Note   I have reviewed the triage vital signs and the nursing notes.   HISTORY  Chief Complaint No chief complaint on file.   HPI William Alvarado is a 69 y.o. male who presents from EMS for being unresponsive.  Story is supplemented by EMS and his wife.  Soundly the patient was recently admitted to hospital for CHF exacerbation.  Is been following up with his primary doctor for hypokalemia, CHF and chronic venous stasis ulcers of his bilateral lower extremities.  He was in his normal state of health without any issues until tonight when he texted his wife multiple times small text with multiple letters that did not make any sense.  Finally he sent one that said help.  She went to him he was responsive at that time but was very weak.  He had episode of what sounds like rigors that lasted until the patient was picked up by EMS.  She states that she took his temperature and it was normal.  She states that she try to get a pulse ox reading but his fingers were too cold when she got them warmed up it also was around 90.  He was on his oxygen.  Then he slowly got more more weak and less and less responsive and slumped down in his chair so her and her son were trying to help pick him up when he was basically dead weight.  EMS was called.  EMS reports that when they arrived he was in low 80s on his nasal cannula.  They state he was responsive but very weak appearing.  They state that he was breathing very quickly initially.  They state that in route the patient became unresponsive.  He subsequently became apneic.  They started bagging him.  They attempted oral intubation without success and then attempted nasal intubation without success.  They then placed a [facility] airway.  Sounds like a few minutes after that the patient reacted to pain and they gave him a dose of Versed brought him here.  Initial end-tidal CO2 was above 70 that has improved.  His oxygenation is improved.  He  is now breathing on his own but still unresponsive. LEVEL V CAVEAT APPLIES SECONDARY TO unresponsive   Past Medical History:  Diagnosis Date  . Acute respiratory failure with hypoxia (HCC)    Hospitalized with acute respiratory failure secondary to CAP, diastolic CHF, and pulmonary embolism  . AKI (acute kidney injury) (HCC)   . Allergy    allergic rhinitis  . Altered mental status   . Atrial fibrillation (HCC)   . Chronic anticoagulation 03/26/2017   Failed Coumadin (PE with therapeutic INR). Now on Xarelto  . Colon polyps   . Community acquired pneumonia of right lower lobe of lung   . COPD (chronic obstructive pulmonary disease) (HCC)   . Degenerative disc disease, lumbar   . Diabetes mellitus without complication (HCC)   . History of pulmonary embolus (PE) 03/26/2017  . Hypertension   . Mitral regurgitation 12/26/2012  . Obesity (BMI 30-39.9) 12/09/2012  . PAF (paroxysmal atrial fibrillation) (HCC)    DCCV in 2014 with recurrent PAF in setting of respiratory failure June 2018. S/P DCCV 04/13/17 to NSR  . Persistent atrial fibrillation (HCC)   . Pneumonia ~ 2016  . Sepsis (HCC) 03/10/2017  . Sleep apnea   . Systolic murmur   . Type II diabetes mellitus [facility])     Patient Active Problem List   Diagnosis  Date Noted  . V-tach ([place]) 09/01/2019  . Acute on chronic heart failure ([place]) 08/28/2019  . Acute on chronic diastolic (congestive) heart failure ([place])   . Chronic venous insufficiency 06/20/2019  . PVD (peripheral vascular disease) ([place]) 03/21/2019  . Venous stasis ulcer ([place]) 03/21/2019  . Hypoxia 03/21/2019  . OSA (obstructive sleep apnea) 03/21/2019  . Gait instability 03/21/2019  . CHF (congestive heart failure) ([place]) 03/21/2019  . Shock ([place])   . Hypovolemia   . Dehydration   . Hypotension 12/09/2017  . Persistent atrial fibrillation ([place])   . Chronic anticoagulation 03/26/2017  . History of pulmonary embolus (PE) 03/26/2017  . Sepsis ([place]) 03/10/2017  . Altered  mental status   . PAF (paroxysmal atrial fibrillation) ([place])   . Mitral regurgitation 12/26/2012  . Morbid obesity ([place]) 12/09/2012  . Hypertension   . Allergy   . Systolic murmur   . Colon polyps   . Diabetes mellitus without complication ([place])   . Degenerative disc disease, lumbar     Past Surgical History:  Procedure Laterality Date  . CARDIOVERSION N/A 01/09/2013   Procedure: CARDIOVERSION;  Surgeon: Fay Records, MD;  Location: [facility];  Service: Cardiovascular;  Laterality: N/A;  . CARDIOVERSION N/A 04/13/2017   Procedure: CARDIOVERSION;  Surgeon: Sueanne Margarita, MD;  Location: [facility] ENDOSCOPY;  Service: Cardiovascular;  Laterality: N/A;  . CARDIOVERSION N/A 09/01/2019   Procedure: CARDIOVERSION;  Surgeon: Jerline Pain, MD;  Location: [facility] ENDOSCOPY;  Service: Cardiovascular;  Laterality: N/A;  . TEE WITHOUT CARDIOVERSION N/A 01/09/2013   Procedure: TRANSESOPHAGEAL ECHOCARDIOGRAM (TEE);  Surgeon: Fay Records, MD;  Location: [facility] ENDOSCOPY;  Service: Cardiovascular;  Laterality: N/A;    Current Outpatient Rx  . Order #: [id number] Class: Historical Med  . Order #: [id number] Class: Normal  . Order #: [id number] Class: Normal  . Order #: [id number] Class: No Print  . Order #: [id number] Class: Normal  . Order #: [id number] Class: Historical Med  . Order #: [id number] Class: No Print  . Order #: [id number] Class: Normal  . Order #: [id number] Class: Normal  . Order #: [id number] Class: Normal    Allergies Patient has no known allergies.  Family History  Problem Relation Age of Onset  . Cancer Mother 43       breast  . Heart disease Father 21       MI  . Cancer Brother        lung  . Stroke Brother 41    Social History Social History   Tobacco Use  . Smoking status: Former Smoker    Packs/day: 1.00    Years: 43.00    Pack years: 43.00    Types: Cigarettes    Quit date: 03/08/2017    Years since quitting: 2.6  . Smokeless tobacco: Never Used  Substance Use Topics  . Alcohol  use: No    Comment: Rare  . Drug use: No    Review of Systems  LEVEL V CAVEAT APPLIES SECONDARY TO unresponsive ____________________________________________  PHYSICAL EXAM:  VITAL SIGNS: ED Triage Vitals  Enc Vitals Group     BP Nov 03, 2019 0425 (!) 117/59     Pulse Rate 2019-11-03 0433 (!) 107     Resp 03-Nov-2019 0425 (!) 29     Temp Nov 03, 2019 0445 (!) 100.9 F (38.3 C)     Temp Source 03-Nov-2019 0445 Bladder     SpO2 2019-11-03 0433 96 %     Weight 2019-11-03 0432 300 lb (136.1 kg)     Height 11/03/19 0432 6\' 1"  (  1.854 m)    Constitutional: Well appearing. Well nourished. Eyes: Conjunctivae are normal. 80mm reactive Pupils.  Head: Atraumatic. Nose: No congestion/rhinnorhea. Mouth/Throat: Mucous membranes are moist.  Oropharynx non-erythematous. Neck: No stridor.  No meningeal signs.   Cardiovascular: tachycardic rate, irregular rhythm.  Respiratory: tachypneic respiratory effort.  Lungs rhonchorous Gastrointestinal: Soft and nontender. Enlarged but No obvious distention.  Musculoskeletal: area of erythema, warmth, induration to left thigh, chronic wounds/edema to BLE. Neurologic:  Not able to assess 2/2 condition.  Skin:   Rash to BLE. Healing bruise to Right Upper Chest Psych: Not able to assess 2/2 condition.   ____________________________________________   LABS (all labs ordered are listed, but only abnormal results are displayed)  Labs Reviewed  I-STAT CHEM 8, ED - Abnormal; Notable for the following components:      Result Value   Sodium 133 (*)    Potassium 3.2 (*)    Chloride 83 (*)    BUN 48 (*)    Creatinine, Ser 2.30 (*)    Glucose, Bld 155 (*)    Calcium, Ion 0.98 (*)    TCO2 40 (*)    All other components within normal limits  CBG MONITORING, ED - Abnormal; Notable for the following components:   Glucose-Capillary 127 (*)    All other components within normal limits  CULTURE, BLOOD (ROUTINE X 2)  CULTURE, BLOOD (ROUTINE X 2)  URINE CULTURE  RESPIRATORY  PANEL BY RT PCR (FLU A&B, COVID)  AMMONIA  COMPREHENSIVE METABOLIC PANEL  LACTIC ACID, PLASMA  RAPID URINE DRUG SCREEN, HOSP PERFORMED  ETHANOL  CBC WITH DIFFERENTIAL/PLATELET  URINALYSIS, COMPLETE (UACMP) WITH MICROSCOPIC  BLOOD GAS, ARTERIAL  TRIGLYCERIDES  BRAIN NATRIURETIC PEPTIDE  I-STAT VENOUS BLOOD GAS, ED   ____________________________________________  EKG  My ECG Read Indication:unresponsive EKG was personally contemporaneously reviewed by myself. Rate: 118 PR Interval: afib QRS duration: 187 QT/QTC: 378/483 Axis: left EKG: afib rvr with frequent pvc's. Other significant findings: significantly different than 09/01/19   ____________________________________________  RADIOLOGY  No results found. ____________________________________________   PROCEDURES  Procedure(s) performed:   .Critical Care Performed by: Marily Memos, MD Authorized by: Marily Memos, MD   Critical care provider statement:    Critical care time (minutes):  45   Critical care was necessary to treat or prevent imminent or life-threatening deterioration of the following conditions:  Sepsis, respiratory failure and circulatory failure   Critical care was time spent personally by me on the following activities:  Discussions with consultants, evaluation of patient's response to treatment, examination of patient, ordering and performing treatments and interventions, ordering and review of laboratory studies, ordering and review of radiographic studies, pulse oximetry, re-evaluation of patient's condition, obtaining history from patient or surrogate and review of old charts Procedure Name: Intubation Date/Time: 03-Nov-2019 5:14 AM Performed by: Marily Memos, MD Pre-anesthesia Checklist: Patient identified, Patient being monitored, Emergency Drugs available, Timeout performed and Suction available Oxygen Delivery Method: Non-rebreather mask Preoxygenation: Pre-oxygenation with 100%  oxygen Induction Type: Rapid sequence Ventilation: Mask ventilation without difficulty Laryngoscope Size: Glidescope and 4 Grade View: Grade II Tube size: 7.5 mm Number of attempts: 1 Airway Equipment and Method: Rigid stylet and Video-laryngoscopy Placement Confirmation: ETT inserted through vocal cords under direct vision,  CO2 detector and Breath sounds checked- equal and bilateral Secured at: 25 cm Tube secured with: ETT holder Dental Injury: Teeth and Oropharynx as per pre-operative assessment  Future Recommendations: Recommend- induction with short-acting agent, and alternative techniques readily available Comments: Blood oropharynx by EMS prior  to my attempt.       ____________________________________________   INITIAL IMPRESSION / ASSESSMENT AND PLAN / ED COURSE  Pertinent labs & imaging results that were available during my care of the patient were reviewed by me and considered in my medical decision making (see chart for details).  With this erythematous area on his left thigh and reported fever with EMS and unresponsiveness concern for severe sepsis so antibiotics ordered.  Along with sepsis protocol.  Also consider possible hypercarbic respiratory failure as it does seem slightly gradual in nature and initially high end-tidal CO2.  Also consider Covid or other infectious pulmonary sources. Blood cultures drawn, urine cultures drawn prior to antibiotics.  Fluids will be given as well.  Blood pressures right now are maintaining maps above 65.  His heart rate is intermittently 110-160 however it does appear that he has multiple PVCs on his EKG.  He does appear to be in atrial fibrillation.  He is normally in a sinus rhythm it appears.  I discussed with his wife and at this time patient is still full code all active measures to be taken.  If at some point we feel that the patient is not going to come off life support at that time he would want to be made DNR.  DELRON COMER was evaluated in Emergency Department on 11/21/19 for the symptoms described in the history of present illness. He was evaluated in the context of the global COVID-19 pandemic, which necessitated consideration that the patient might be at risk for infection with the SARS-CoV-2 virus that causes COVID-19. Institutional protocols and algorithms that pertain to the evaluation of patients at risk for COVID-19 are in a state of rapid change based on information released by regulatory bodies including the CDC and federal and state organizations. These policies and algorithms were followed during the patient's care in the ED.   ____________________________________________  FINAL CLINICAL IMPRESSION(S) / ED DIAGNOSES  Final diagnoses:  Acute respiratory failure with hypoxia (HCC)  Sepsis, due to unspecified organism, unspecified whether acute organ dysfunction present (HCC)    MEDICATIONS GIVEN DURING THIS VISIT:  Medications  fentaNYL in NS (30mcg/ml) infusion-PREMIX (50 mcg/hr Intravenous New Bag/Given 2019/11/21 0452)  fentaNYL (SUBLIMAZE) bolus via infusion 25 mcg (has no administration in time range)  propofol (DIPRIVAN) 1000 MG/100ML infusion (0 mcg/kg/min  136.1 kg Intravenous Hold 11/21/19 0435)  midazolam (VERSED) injection 1 mg (has no administration in time range)  midazolam (VERSED) injection 1 mg (has no administration in time range)  vancomycin (VANCOCIN) 2,500 mg in sodium chloride 0.9 % 500 mL IVPB (has no administration in time range)  ceFEPIme (MAXIPIME) 2 g in sodium chloride 0.9 % 100 mL IVPB (has no administration in time range)  etomidate (AMIDATE) injection (30 mg Intravenous Given 2019-11-21 0425)  rocuronium (ZEMURON) injection (100 mg Intravenous Given 11-21-2019 0425)  fentaNYL (SUBLIMAZE) injection 50 mcg (50 mcg Intravenous Given Nov 21, 2019 0454)  sodium chloride 0.9 % bolus 1,000 mL (1,000 mLs Intravenous New Bag/Given 21-Nov-2019 0435)    NEW OUTPATIENT MEDICATIONS  STARTED DURING THIS VISIT:  New Prescriptions   No medications on file    Note:  This document was prepared using Dragon voice recognition software and may include unintentional dictation errors.    Raneem Mendolia, Barbara Cower, MD 10/30/19 [phone]

## 2019-11-20 NOTE — Procedures (Signed)
Echo attempted. MD requested that it be completed 2/8.

## 2019-11-20 NOTE — Progress Notes (Signed)
RT note: RT and RN transported patient on ventilator from ED to 2H21. Vital signs stable through out. Unit RT given report.

## 2019-11-20 NOTE — ED Triage Notes (Signed)
Pt. BIB [facility] from c/o. Original call out AMS. Sent text to wife calling for help. EMS arrival pt minimal communication. 80% 2 L home O2. HX CHF/COPD.   In Route EMS reports pt apneic. [facility] airway. Sedation 5 Midazolam.

## 2019-11-20 NOTE — Progress Notes (Signed)
Notified bedside nurse of need to draw repeat lactic acid. 

## 2019-11-20 NOTE — Progress Notes (Signed)
PHARMACY - PHYSICIAN COMMUNICATION CRITICAL VALUE ALERT - BLOOD CULTURE IDENTIFICATION (BCID)  William Alvarado is an 69 y.o. male who presented to [facility] on 10/31/2019 with a chief complaint of sepsis acidosis and failure to thrive.  Assessment:  1/4 anaerobic GPC. Suggest starting metronidazole, MD agreed.  Name of physician (or Provider) Contacted: Dr. Marcos Eke  Current antibiotics: cefepime, vancomycin   Changes to prescribed antibiotics recommended:  Recommendations accepted by provider  Alphia Moh, PharmD, BCPS, BCCP Clinical Pharmacist  Please check AMION for all [facility] Pharmacy phone numbers After 10:00 PM, call Main Pharmacy [phone]

## 2019-11-20 NOTE — Progress Notes (Signed)
Notified provider of need to order additional  fluid bolus for a total of 4,083 ml based off of weight 136.1 kg.

## 2019-11-20 NOTE — Procedures (Signed)
Arterial Catheter Insertion Procedure Note HESSTON HITCHENS [id number] [birth year]  Procedure: Insertion of Arterial Catheter  Indications: Blood pressure monitoring and Frequent blood sampling  Procedure Details Consent: Unable to obtain consent because of emergent medical necessity. Time Out: Verified patient identification, verified procedure, site/side was marked, verified correct patient position, special equipment/implants available, medications/allergies/relevent history reviewed, required imaging and test results available.  Performed  Maximum sterile technique was used including antiseptics, cap, gloves, gown, hand hygiene, mask and sheet. Skin prep: Chlorhexidine; local anesthetic administered 20 gauge catheter was inserted into left radial artery using the Seldinger technique. ULTRASOUND GUIDANCE USED: NO Evaluation Blood flow good; BP tracing good. Complications: No apparent complications. RT placed arrow catheter on first attempt.   Morley Kos 05-Nov-2019

## 2019-11-20 NOTE — Progress Notes (Signed)
Patient unable to tolerate PRVC mode of ventilation due to PIP 50cm H2O or greater.  RT placed patient in Pressure control mode with PC30 and RR 26 to match minute ventilation. RT confirmed this setting with [place], MD. RT will obtain ABG in 30 minutes and adjust settings accordingly.

## 2019-11-20 NOTE — Progress Notes (Signed)
Drips wasted with second verifier, Holland Falling, RN.:  -34mL of morphine in stericycle.  - of fentanyl in stericycle.

## 2019-11-20 NOTE — Progress Notes (Addendum)
Pharmacy Antibiotic Note  William Alvarado is a 69 y.o. male admitted on 11-13-19 with sepsis.  Pharmacy has been consulted for vancomycin and cefepime dosing.  Plan: Vancomycin 2500mg  given in ED followed by 2250mg  IV Q48H.  Goal AUC 400-550.  Expected AUC 510. SCr used 2.3.  Cefepime 2g IV Q24H.  Height: 5\' 11"  (180.3 cm)(measured by RT) Weight: 300 lb (136.1 kg) IBW/kg (Calculated) : 75.3  Temp (24hrs), Avg:102.4 F (39.1 C), Min:100.7 F (38.2 C), Max:103.3 F (39.6 C)  Recent Labs  Lab 10/27/19 1544 2019/11/13 0432 11-13-2019 0505  WBC  --  10.5  --   CREATININE 1.70* 2.34* 2.30*  LATICACIDVEN  --  3.4*  --     Estimated Creatinine Clearance: 44.5 mL/min (A) (by C-G formula based on SCr of 2.3 mg/dL (H)).    No Known Allergies  Thank you for allowing pharmacy to be a part of this patient's care.  12/25/19, PharmD, BCPS  11-13-19 7:39 AM

## 2019-11-20 NOTE — Death Summary Note (Signed)
Cardiac time of death: 2142  Pt nurse and second verifier, Holland Falling, RN., confirmed absence of all movement, listened for cardiac sounds for 2 minutes - no audible heart tones. Both nurses verified asystole on the cardiac monitor review.

## 2019-11-20 NOTE — Procedures (Signed)
Central Venous Catheter Insertion Procedure Note HENRYK URSIN [id number] 03/21/1951  Procedure: Insertion of Central Venous Catheter Indications: Assessment of intravascular volume, Drug and/or fluid administration and Frequent blood sampling  Procedure Details Consent: Risks of procedure as well as the alternatives and risks of each were explained to the (patient/caregiver).  Consent for procedure obtained. Time Out: Verified patient identification, verified procedure, site/side was marked, verified correct patient position, special equipment/implants available, medications/allergies/relevent history reviewed, required imaging and test results available.  Performed  Maximum sterile technique was used including antiseptics, cap, gloves, gown, hand hygiene, mask and sheet. Skin prep: Chlorhexidine; local anesthetic administered A antimicrobial bonded/coated triple lumen catheter was placed in the right internal jugular vein using the Seldinger technique.  Evaluation Blood flow good Complications: No apparent complications Patient did tolerate procedure well. Chest X-ray ordered to verify placement.  CXR: pending. Given 1 U ffp first, could not wait for second unit due to multiple high dose pressors running through line.   William Alvarado Nov 24, 2019, 4:09 PM

## 2019-11-20 NOTE — H&P (Signed)
PULMONARY / CRITICAL CARE MEDICINE   NAME:  William Alvarado, MRN:  [id number], DOB:  Jul 30, 1951, LOS: 0 ADMISSION DATE:  11/13/19, CONSULTATION DATE: 11-13-2019 REFERRING MD: Department physician, CHIEF COMPLAINT: Vent dependent respiratory failure secondary to sepsis  BRIEF HISTORY:    69 year old male with multiple health issues and poor prognosis admitted with sepsis acidosis generalized failure to thrive HISTORY OF PRESENT ILLNESS   William Alvarado is a 69 year old male with a plethora of health issues that include failure to thrive, poorly controlled hypertension, poorly controlled diabetes, morbid obese,Mitral valve regurgitation, atrial fibrillation on chronic anticoagulation, venous stasis poor vascularization of lower extremities with chronic ulcers, obstructive sleep apnea, chronic hypercarbia who presents to [facility] with several days of not feeling well. Noted to be hypotensive with lactic acidosis hypoxic and intubated by the emergency department physician.  Very difficult to ventilate requiring multiple modes of ventilation to achieve proper oxygenation.  Is noted to be hypotensive considered to be septic with multiple areas of possible source of infection.  He is noted to have history of congestive heart failure.  He has lower extremity wraps bilateral lower extremities with peripheral vascular disease multiple ulcers and gangrenous appearing toes. Pulmonary critical care called to the bedside 11-13-19 to help manage.  Wife William Alvarado was called Long discussion CODE STATUS was changed from full code to limited code with no shock and no CPR.  We will manage his sepsis with antimicrobial therapy judicious fluids vasopressor support as needed.  We will not shocked him or do chest compressions.  We will transfer to the intensive care unit bed.  His prognosis appears grim at this time. SIGNIFICANT PAST MEDICAL HISTORY   Congestive heart failure Atrial fibrillation Peripheral  vascular disease OSA OHS Generalized failure to thrive Morbid obesity  SIGNIFICANT EVENTS:  10/28/2018 wound admitted to [facility] made a limited CODE BLUE no shock no CPR STUDIES:   13-Nov-2019 abdominal ultrasound>> CULTURES:  11-13-2019 blood cultures x2>> 11-13-19 sputum culture>>  ANTIBIOTICS:  11-13-2019 vancomycin>> 2019/11/13 cefepime>>  LINES/TUBES:  2019-11-13 endotracheal tube>> Nov 13, 2019 A-line>> CONSULTANTS:   SUBJECTIVE:  69 year old male with multiple comorbidities poor chance of survival  CONSTITUTIONAL: BP (!) 78/52   Pulse 95   Temp (!) 101.1 F (38.4 C)   Resp (!) 29   Ht 5\' 11"  (1.803 m) Comment: measured by RT  Wt 136.1 kg   SpO2 96%   BMI 41.84 kg/m   No intake/output data recorded.     Vent Mode: PCV FiO2 (%):  [100 %] 100 % Set Rate:  [24 bmp-28 bmp] 28 bmp PEEP:  [8 cmH20] 8 cmH20 Plateau Pressure:  [30 cmH20] 30 cmH20  PHYSICAL EXAM: General: Morbid obese male who sedated on full mechanical ventilatory support Neuro: Currently heavily sedated unable to do adequate evaluation HEENT: Pupils equal reactive light no JVD lymphadenopathy is appreciated Cardiovascular: Heart sounds are irregular irregular with ventricular rate of 122 Lungs: Coarse rhonchi bilaterally diminished breath sounds in the bases faint crackles Abdomen: Grossly distended faint bowel sounds according to wife this is a normal abdomen for him Musculoskeletal: Multiple areas of ecchymosis. Skin: Bilateral lower extremities with ulcerations peripheral vascular disease toes that are near gangrenous  RESOLVED PROBLEM LIST   ASSESSMENT AND PLAN   Vent dependent respiratory failure in the setting of morbid obesity, obesity hypoventilation syndrome, obstructive sleep apnea suspected pneumonia "possible aspiration. Currently on pressure regulated volume control with goal of keeping plateau pressures less than 30 Adequate sedation Empirical antimicrobial  therapy  Panculture Bronchodilators  Presumed sepsis with procalcitonin of 2.08 noted to be hypothermic.  Chest x-ray with findings concerning for pneumonia with possible aspiration. Judicious fluid resuscitation due to history of congestive heart failure Antimicrobial therapy Peripheral Neo-Synephrine as needed due to his history of atrial fibrillation  Chronic atrial fibrillation with controlled ventricular rate We will hold p.o. amiodarone at this time May need IV amiodarone in the future   Mixed acidosis with lactic acid of 3.4 and elevated PCO2 Judicious fluid administration Suspect multiple areas for lactic buildup Continue to monitor lactic acid If pH goes below 7.20 consider bicarbonate Use the ventilator to blow off PCO2 to help correct mixed acidosis Ultrasound of the abdomen for observation revealed source for acidosis      Diabetes mellitus CBG (last 3)  Recent Labs    11/06/2019 0458  GLUCAP 127*    Sliding scale insulin protocol  Peripheral vascular disease with multiple areas of ulceration poor blood flow      We will need wound care consult Try to not use vasoconstrictors unless absolutely necessary   SUMMARY OF TODAY'S PLAN:  William Alvarado is a 69 year old male with a plethora of health issues that include failure to thrive, poorly controlled hypertension, poorly controlled diabetes, morbid obese,Mitral valve regurgitation, atrial fibrillation on chronic anticoagulation, venous stasis poor vascularization of lower extremities with chronic ulcers, obstructive sleep apnea, chronic hypercarbia who presents to [facility] with several days of not feeling well. Noted to be hypotensive with lactic acidosis hypoxic and intubated by the emergency department physician.  Very difficult to ventilate requiring multiple modes of ventilation to achieve proper oxygenation.  Is noted to be hypotensive considered to be septic with multiple areas of possible source  of infection.  He is noted to have history of congestive heart failure.  He has lower extremity wraps bilateral lower extremities with peripheral vascular disease multiple ulcers and gangrenous appearing toes. Pulmonary critical care called to the bedside 11-06-19 to help manage.  Wife William Alvarado was called Long discussion CODE STATUS was changed from full code to limited code with no shock and no CPR.  We will manage his sepsis with antimicrobial therapy judicious fluids vasopressor support as needed.  We will not shocked him or do chest compressions.  We will transfer to the intensive care unit bed.  His prognosis appears grim at this time.  Best Practice / Goals of Care / Disposition.   DVT PROPHYLAXIS: Currently supratherapeutic INR greater than 3 SUP: PPI NUTRITION: N.p.o. MOBILITY: Bedrest GOALS OF CARE: Limited CODE BLUE no shock no CPR FAMILY DISCUSSIONS: Long discussion with wife William Alvarado no CPR no shock reasonable to continue current interventions but no escalation. DISPOSITION transfer to intensive care unit when bed becomes available  LABS  Glucose Recent Labs  Lab 11/06/2019 0458  GLUCAP 127*    BMET Recent Labs  Lab 10/27/19 1544 10/27/19 1544 2019-11-06 0432 2019/11/06 0432 11-06-19 0505 11/06/19 0527 11/06/19 0631  NA 135   < > 135   < > 133*  133* 134* 136  K 3.1*   < > 3.4*   < > 3.2*  3.2* 2.8* 2.9*  CL 84*  --  84*  --  83*  --   --   CO2 42*  --  36*  --   --   --   --   BUN 39*  --  45*  --  48*  --   --   CREATININE 1.70*  --  2.34*  --  2.30*  --   --   GLUCOSE 141*  --  152*  --  155*  --   --    < > = values in this interval not displayed.    Liver Enzymes Recent Labs  Lab 23-Nov-2019 0432  AST 23  ALT 11  ALKPHOS 100  BILITOT 1.2  ALBUMIN 3.0*    Electrolytes Recent Labs  Lab 10/27/19 1544 11/23/19 0432  CALCIUM 9.1 8.6*    CBC Recent Labs  Lab 2019-11-23 0432 2019-11-23 0432 November 23, 2019 0505 11-23-2019 0527 11-23-19 0631  WBC 10.5  --    --   --   --   HGB 12.7*   < > 15.3  15.3 14.3 14.6  HCT 44.1   < > 45.0  45.0 42.0 43.0  PLT 373  --   --   --   --    < > = values in this interval not displayed.    ABG Recent Labs  Lab 2019-11-23 0527 11-23-19 0631  PHART 7.299* 7.260*  PCO2ART 83.8* 90.1*  PO2ART 75.0* 112.0*    Coag's Recent Labs  Lab 11/23/2019 0510  APTT 51*  INR 3.7*    Sepsis Markers Recent Labs  Lab November 23, 2019 0432  LATICACIDVEN 3.4*    Cardiac Enzymes No results for input(s): TROPONINI, PROBNP in the last 168 hours.  PAST MEDICAL HISTORY :   He  has a past medical history of Acute respiratory failure with hypoxia ([place]), AKI (acute kidney injury) ([place]), Allergy, Altered mental status, Atrial fibrillation ([place]), Chronic anticoagulation (03/26/2017), Colon polyps, Community acquired pneumonia of right lower lobe of lung, COPD (chronic obstructive pulmonary disease) ([place]), Degenerative disc disease, lumbar, Diabetes mellitus without complication ([place]), History of pulmonary embolus (PE) (03/26/2017), Hypertension, Mitral regurgitation (12/26/2012), Obesity (BMI 30-39.9) (12/09/2012), PAF (paroxysmal atrial fibrillation) ([place]), Persistent atrial fibrillation ([place]), Pneumonia (~ 2016), Sepsis ([place]) (03/10/2017), Sleep apnea, Systolic murmur, and Type II diabetes mellitus ([place]).  PAST SURGICAL HISTORY:  He  has a past surgical history that includes TEE without cardioversion (N/A, 01/09/2013); Cardioversion (N/A, 01/09/2013); Cardioversion (N/A, 04/13/2017); and Cardioversion (N/A, 09/01/2019).  No Known Allergies  No current facility-administered medications on file prior to encounter.   Current Outpatient Medications on File Prior to Encounter  Medication Sig  . acetaminophen (TYLENOL) 500 MG tablet Take 1,000 mg by mouth 2 (two) times daily as needed (pain).  Marland Kitchen amiodarone (PACERONE) 200 MG tablet Take 1 tablet (200 mg total) by mouth daily. Start after finished the 400 mg tabs, in 2 weeks.  . Dulaglutide  (TRULICITY) 1.5 CH/8.5ID SOPN INJECT 1.5 MG INTO THE SKIN ONCE A WEEK (Patient taking differently: Inject 1.5 mg into the skin every Friday. )  . furosemide (LASIX) 20 MG tablet Take 2 tablets (40 mg total) by mouth 2 (two) times daily.  . metoprolol tartrate (LOPRESSOR) 50 MG tablet Take 1 tablet by mouth twice daily (Patient taking differently: Take 50 mg by mouth 2 (two) times daily. )  . OXYGEN Inhale 2 L into the lungs continuous.  . potassium chloride SA (KLOR-CON) 20 MEQ tablet Take 2 tablets (40 mEq total) by mouth daily.  . pravastatin (PRAVACHOL) 20 MG tablet Take 1 tablet (20 mg total) by mouth daily.  . rivaroxaban (XARELTO) 20 MG TABS tablet TAKE 1 TABLET BY MOUTH ONCE DAILY WITH SUPPER (Patient taking differently: Take 20 mg by mouth daily with supper. )  . sitaGLIPtin (JANUVIA) 100 MG tablet Take 1 tablet (100 mg total) by mouth daily. (Patient taking  differently: Take 100 mg by mouth daily with supper. )    FAMILY HISTORY:   His family history includes Cancer in his brother; Cancer (age of onset: 90) in his mother; Heart disease (age of onset: 45) in his father; Stroke (age of onset: 79) in his brother.  SOCIAL HISTORY:  He  reports that he quit smoking about 2 years ago. His smoking use included cigarettes. He has a 43.00 pack-year smoking history. He has never used smokeless tobacco. He reports that he does not drink alcohol or use drugs.  REVIEW OF SYSTEMS:    NA  App cct 67  Melrose Nakayama William Alvarado ACNP Acute Care Nurse Practitioner [facility] Pulmonary/Critical Care Please consult Amion 11-19-2019, 7:57 AM

## 2019-11-20 NOTE — Progress Notes (Signed)
Notified bedside nurse of need to draw repeat lactic acid in the next hour since it increased.Marland Kitchen

## 2019-11-20 NOTE — Progress Notes (Signed)
PCCM Interval Progress note:  Spoke with patient's wife, son and daughter at the bedside.  They have decided to transition to full comfort care and are comfortable with stopping all pressors and terminal extubation.   Comfort care orders entered.     Darcella Gasman Jarrick Fjeld, PA-C

## 2019-11-20 NOTE — Procedures (Signed)
Extubation Procedure Note  Patient Details:   Name: ERHARD SENSKE DOB: 1950/12/05 MRN: [id number]   Airway Documentation:    Vent end date: November 14, 2019 Vent end time: 2103   Evaluation  O2 sats: stable throughout Complications: No apparent complications Patient did tolerate procedure well. Bilateral Breath Sounds: Diminished, Rhonchi   No   Patient compassionately extubated to room air.   Dream Harman A Elisse Pennick 2019/11/14, 9:06 PM

## 2019-11-20 NOTE — Progress Notes (Addendum)
Septic shock, Enlarged RV, some degree of septic cardiomyopathy.  Repeat coox ordered.  Hypotensive, despite high dose pressors.  GPC anaerobes growing 1/4 from blood cultures.  Increasing volume overload in setting of oliguria and AKI. Increasing B infiltrates.  Trial of lasix 100mg , clinda and flagyl.  Too unstable for renal replacement therapy at this time.   Family considering comfort care.   Cont to titrate down phenyephrine, titrate up on levophed.   Appreciate night team help with this sick patient with ongoing active issues.   I let Dr. , his PCP, know that he is in the hospital, per the family request.

## 2019-11-20 NOTE — Progress Notes (Signed)
Patient transported on ventilator from ED Trauma A to CT and back with no complications.

## 2019-11-20 DEATH — deceased

## 2019-11-29 ENCOUNTER — Ambulatory Visit: Payer: Medicare HMO

## 2019-12-21 NOTE — Death Summary Note (Signed)
  DEATH SUMMARY   Patient Details  Name: ATHA MCBAIN MRN: [id number] DOB: [birth year]  Admission/Discharge Information   Admit Date:  2019/11/11  Date of Death: Date of Death: 2019/11/11  Time of Death: Time of Death: 2141-01-13  Length of Stay: 1  Referring Physician: Donita Brooks, MD   Reason(s) for Hospitalization  Septic shock, Community acquired pneumonia Hypoxemic respiratory failure  Diagnoses  Preliminary cause of death:  Secondary Diagnoses (including complications and co-morbidities):  Active Problems:   Sepsis [facility])   Brief Hospital Course (including significant findings, care, treatment, and services provided and events leading to death)  REEVE TURNLEY is a 69 y.o. year old male with a history of HTN, DM, obesity, MVR, atrial fibrillation, HFpEF/CHF, hx of small PE 01-13-2017,  OSA, severe chronic lower extremity wounds/venous insufficiency, chronic hypercarbia  who presented early this am with malaise.  Wife found him in early am complaining of being very cold, tachypneic and altered.   Intubated in the ER for hypoxia, hypercarbic respiratory failure.  Profoundly hypotension, sepsis.    Started on cefepime and vancomycin, phenylephrine, given fluids.   Made DNR on admission after discussion with wife.   Was increasingly difficult to ventilate, with high peak pressures, fluids stopped.  Mixed venous O2 73.8.   LVEF 50%.  Enlarged RV with reduced systolic function.   Increasing volume overload, AKI, B infiltrates, minimal UOP.   Very hypotensive, titrated up to high dose levophed.   Too unstable for CRRT.  Minimal UOP.   Given progressively worsening shock dependent on high dose pressors, worsening renal failure (too unstable for HD) and respiratory failure, all contributing to poor prognosis, family opted for comfort care.   Pertinent Labs and Studies  Significant Diagnostic Studies No results found.  Microbiology No results found for this or any previous  visit (from the past 240 hour(s)).  Lab Basic Metabolic Panel: No results for input(s): NA, K, CL, CO2, GLUCOSE, BUN, CREATININE, CALCIUM, MG, PHOS in the last 168 hours. Liver Function Tests: No results for input(s): AST, ALT, ALKPHOS, BILITOT, PROT, ALBUMIN in the last 168 hours. No results for input(s): LIPASE, AMYLASE in the last 168 hours. No results for input(s): AMMONIA in the last 168 hours. CBC: No results for input(s): WBC, NEUTROABS, HGB, HCT, MCV, PLT in the last 168 hours. Cardiac Enzymes: No results for input(s): CKTOTAL, CKMB, CKMBINDEX, TROPONINI in the last 168 hours. Sepsis Labs: No results for input(s): PROCALCITON, WBC, LATICACIDVEN in the last 168 hours.  Procedures/Operations  Central line 11-10-2022 Arterial line 11/10/2022 Intubation November 10, 2022   Charlotte Sanes 12/05/2019, 8:38 AM

## 2020-04-05 IMAGING — US US ABDOMEN LIMITED
1 series · 14 of 25 positions shown · non-contrast
Comparison: None.

CLINICAL DATA: 68-year-old male with sepsis.

EXAM:
ULTRASOUND ABDOMEN LIMITED RIGHT UPPER QUADRANT

[Series 1: us abdomen limited · 14 of 43 slices shown]
[im 1/43]
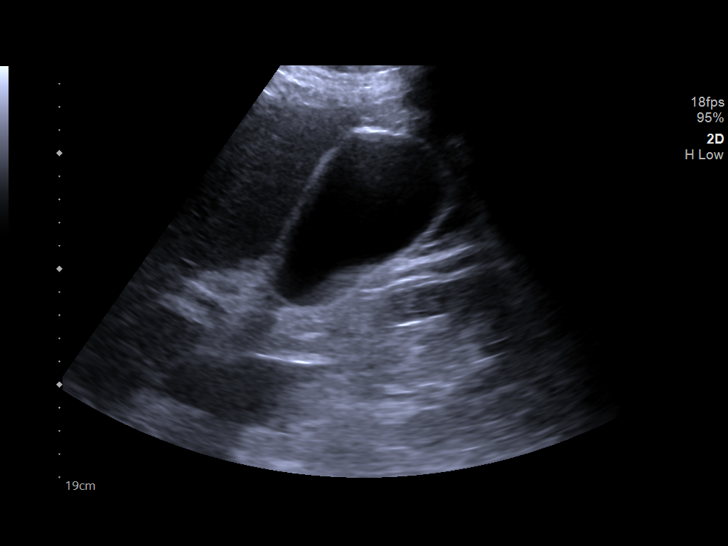
[im 4/43]
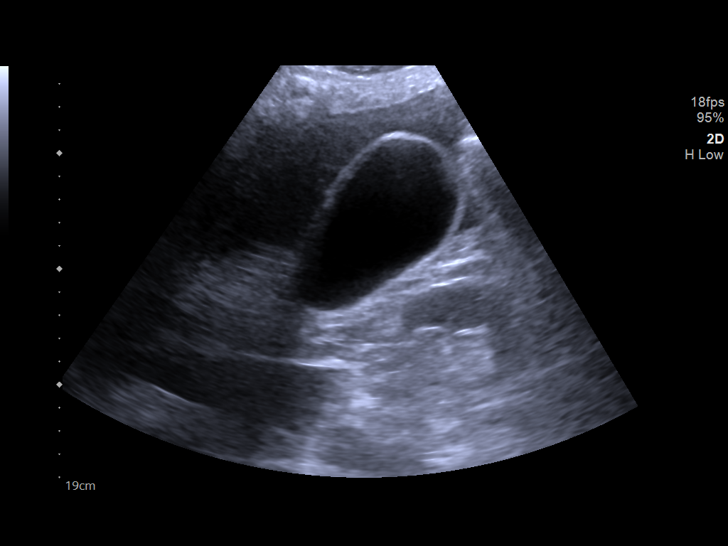
[im 8/43]
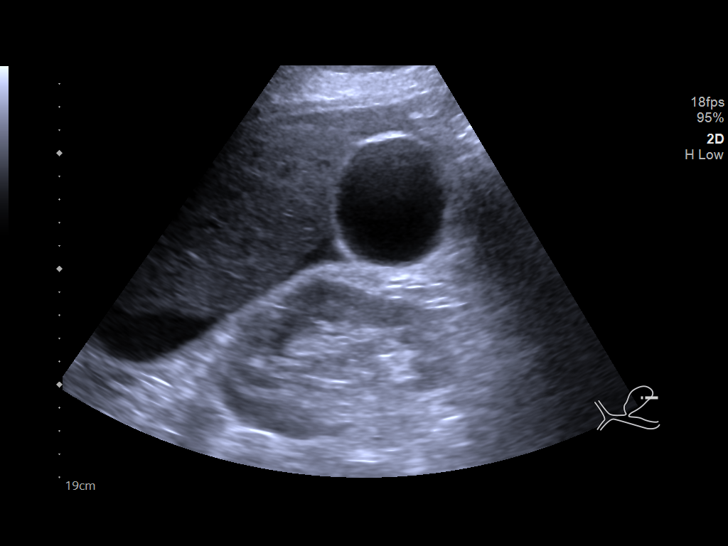
[im 11/43]
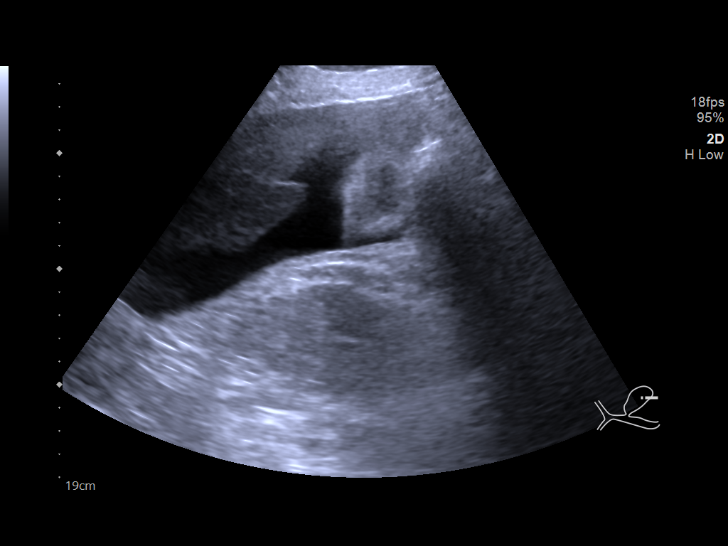
[im 15/43]
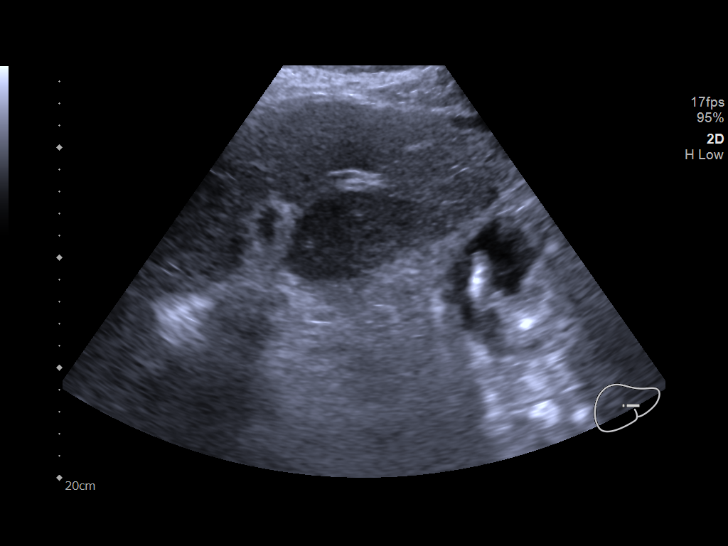
[im 16/43]
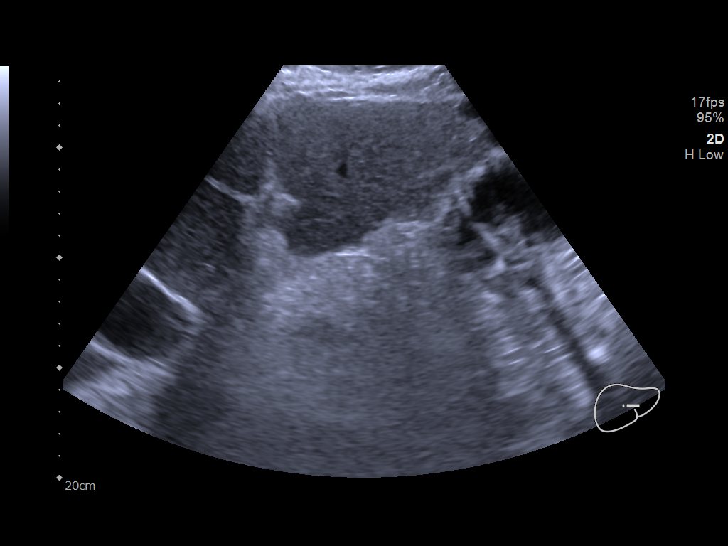
[im 20/43]
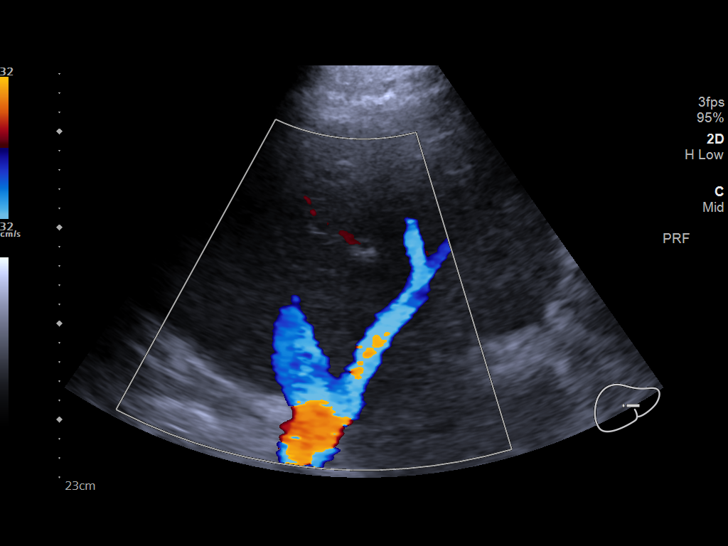
[im 23/43]
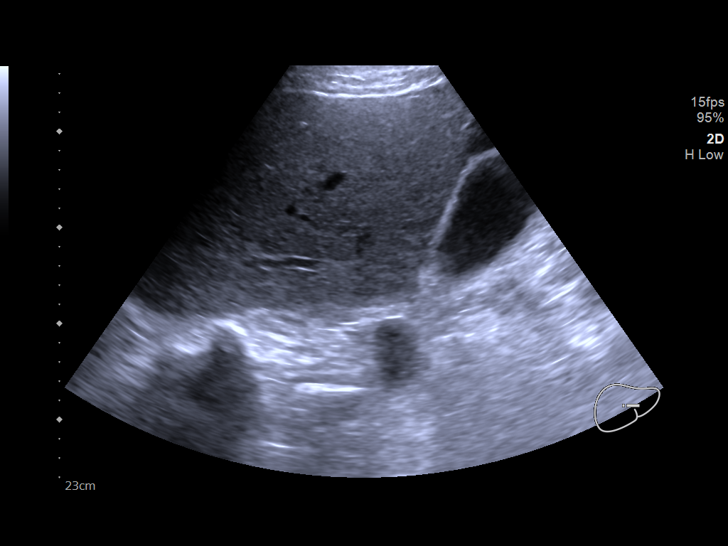
[im 27/43]
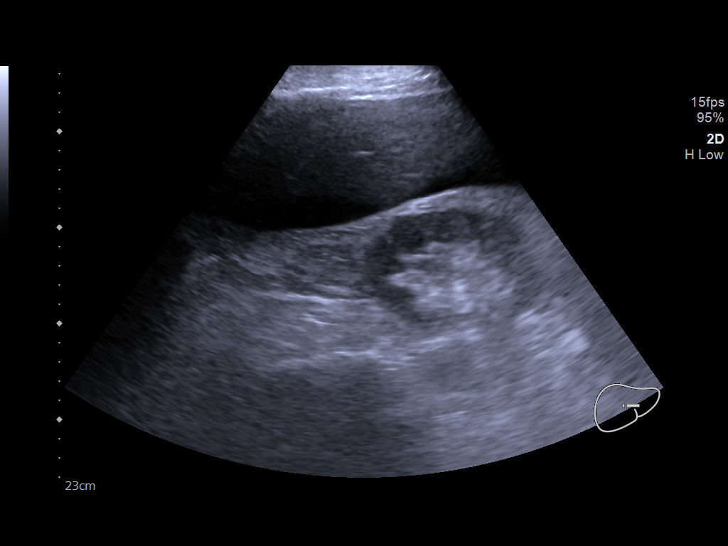
[im 29/43]
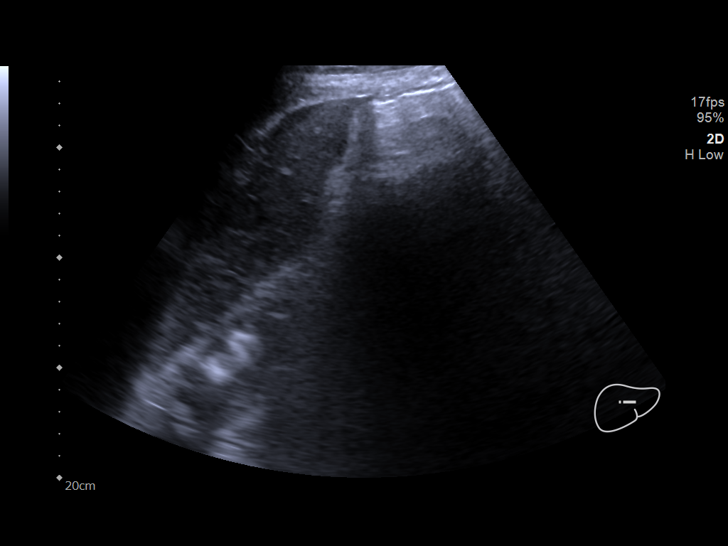
[im 32/43]
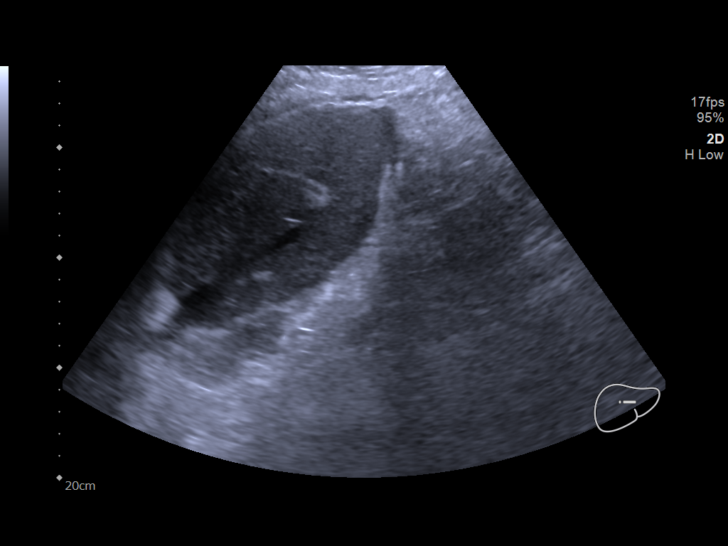
[im 36/43]
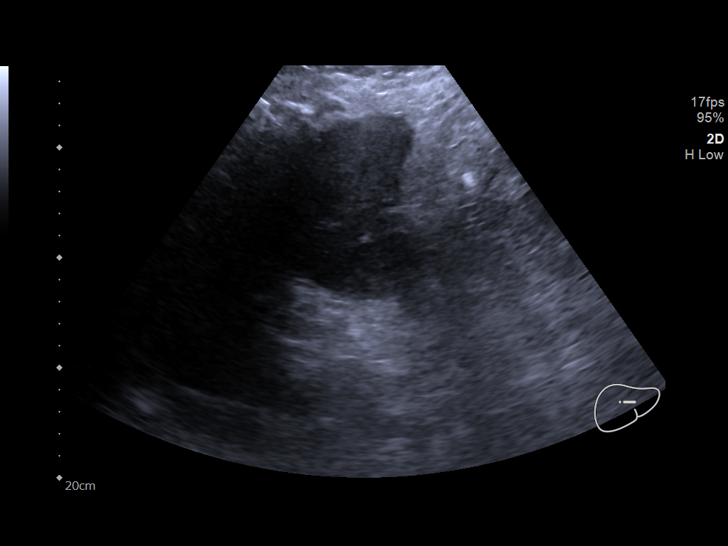
[im 39/43]
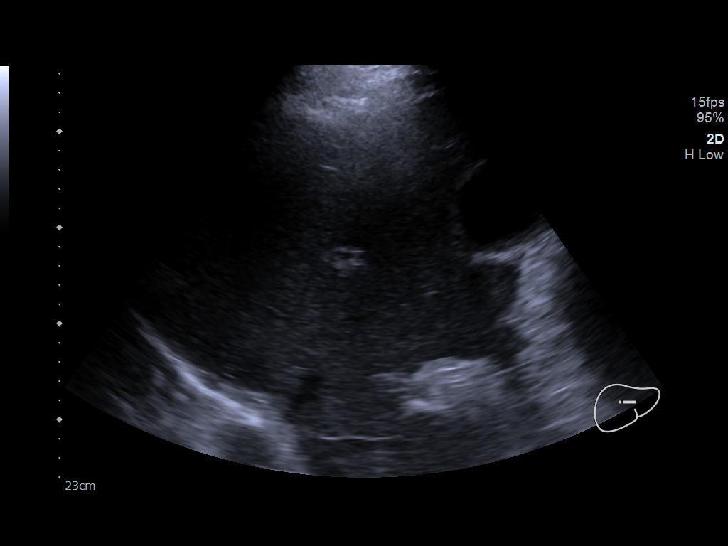
[im 43/43]
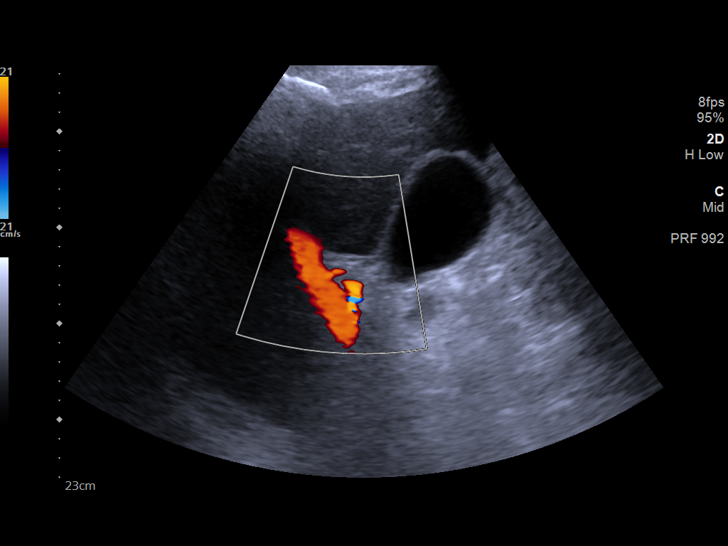

[14 of 25 positions shown; findings below may reference images not displayed]

FINDINGS: Gallbladder:

Mild gallbladder wall thickening is noted (4 mm). There is no
evidence of cholelithiasis or sonographic Murphy sign.

Common bile duct:

Diameter: 3.7 mm. No intrahepatic or extrahepatic biliary
dilatation.

Liver:

No focal lesion identified. Within normal limits in parenchymal
echogenicity. Portal vein is patent on color Doppler imaging with
normal direction of blood flow towards the liver.

Other: A small amount of ascites adjacent to the liver is noted.
IMPRESSION: 1. Mild gallbladder wall thickening without cholelithiasis. This
most likely is related to hepatic dysfunction or ascites. Consider
nuclear medicine study if there is strong clinical suspicion for
acute cholecystitis.
2. Small amount of perihepatic ascites.
